# Patient Record
Sex: Female | Born: 1950 | Race: White | Hispanic: No | Smoking: Current every day smoker
Health system: Southern US, Community
[De-identification: ages and names within clinical notes are randomized; demographics above are authoritative.]

## PROBLEM LIST (undated history)

## (undated) DIAGNOSIS — G629 Polyneuropathy, unspecified: Secondary | ICD-10-CM

## (undated) DIAGNOSIS — C541 Malignant neoplasm of endometrium: Secondary | ICD-10-CM

## (undated) DIAGNOSIS — M1990 Unspecified osteoarthritis, unspecified site: Secondary | ICD-10-CM

## (undated) DIAGNOSIS — Z8481 Family history of carrier of genetic disease: Secondary | ICD-10-CM

## (undated) DIAGNOSIS — Z87442 Personal history of urinary calculi: Secondary | ICD-10-CM

## (undated) DIAGNOSIS — T7840XA Allergy, unspecified, initial encounter: Secondary | ICD-10-CM

## (undated) DIAGNOSIS — F419 Anxiety disorder, unspecified: Secondary | ICD-10-CM

## (undated) DIAGNOSIS — G709 Myoneural disorder, unspecified: Secondary | ICD-10-CM

## (undated) DIAGNOSIS — H269 Unspecified cataract: Secondary | ICD-10-CM

## (undated) DIAGNOSIS — E7800 Pure hypercholesterolemia, unspecified: Secondary | ICD-10-CM

## (undated) DIAGNOSIS — D649 Anemia, unspecified: Secondary | ICD-10-CM

## (undated) DIAGNOSIS — K219 Gastro-esophageal reflux disease without esophagitis: Secondary | ICD-10-CM

## (undated) DIAGNOSIS — R3989 Other symptoms and signs involving the genitourinary system: Secondary | ICD-10-CM

## (undated) DIAGNOSIS — N9489 Other specified conditions associated with female genital organs and menstrual cycle: Secondary | ICD-10-CM

## (undated) DIAGNOSIS — E119 Type 2 diabetes mellitus without complications: Secondary | ICD-10-CM

## (undated) DIAGNOSIS — M810 Age-related osteoporosis without current pathological fracture: Secondary | ICD-10-CM

## (undated) DIAGNOSIS — F329 Major depressive disorder, single episode, unspecified: Secondary | ICD-10-CM

## (undated) DIAGNOSIS — Z800 Family history of malignant neoplasm of digestive organs: Secondary | ICD-10-CM

## (undated) DIAGNOSIS — I10 Essential (primary) hypertension: Secondary | ICD-10-CM

## (undated) HISTORY — PX: POLYPECTOMY: SHX149

## (undated) HISTORY — DX: Age-related osteoporosis without current pathological fracture: M81.0

## (undated) HISTORY — DX: Family history of malignant neoplasm of digestive organs: Z80.0

## (undated) HISTORY — DX: Anemia, unspecified: D64.9

## (undated) HISTORY — DX: Allergy, unspecified, initial encounter: T78.40XA

## (undated) HISTORY — DX: Unspecified cataract: H26.9

## (undated) HISTORY — DX: Myoneural disorder, unspecified: G70.9

## (undated) HISTORY — PX: TONSILLECTOMY AND ADENOIDECTOMY: SHX28

## (undated) HISTORY — DX: Family history of carrier of genetic disease: Z84.81

## (undated) HISTORY — PX: TUBAL LIGATION: SHX77

---

## 1983-07-07 HISTORY — PX: OTHER SURGICAL HISTORY: SHX169

## 2016-07-06 HISTORY — PX: REPLACEMENT TOTAL KNEE BILATERAL: SUR1225

## 2017-03-06 HISTORY — PX: REVISION TOTAL KNEE ARTHROPLASTY: SUR1280

## 2017-07-06 HISTORY — PX: HYSTERECTOMY ABDOMINAL WITH SALPINGO-OOPHORECTOMY: SHX6792

## 2018-01-11 DIAGNOSIS — K449 Diaphragmatic hernia without obstruction or gangrene: Secondary | ICD-10-CM | POA: Insufficient documentation

## 2018-01-11 DIAGNOSIS — M3503 Sicca syndrome with myopathy: Secondary | ICD-10-CM | POA: Insufficient documentation

## 2018-01-11 DIAGNOSIS — N393 Stress incontinence (female) (male): Secondary | ICD-10-CM | POA: Insufficient documentation

## 2018-01-11 DIAGNOSIS — M47816 Spondylosis without myelopathy or radiculopathy, lumbar region: Secondary | ICD-10-CM | POA: Insufficient documentation

## 2018-01-11 DIAGNOSIS — M179 Osteoarthritis of knee, unspecified: Secondary | ICD-10-CM | POA: Insufficient documentation

## 2018-01-11 DIAGNOSIS — E7800 Pure hypercholesterolemia, unspecified: Secondary | ICD-10-CM | POA: Insufficient documentation

## 2018-01-11 DIAGNOSIS — K219 Gastro-esophageal reflux disease without esophagitis: Secondary | ICD-10-CM | POA: Insufficient documentation

## 2018-01-11 DIAGNOSIS — D126 Benign neoplasm of colon, unspecified: Secondary | ICD-10-CM | POA: Insufficient documentation

## 2018-01-11 DIAGNOSIS — E119 Type 2 diabetes mellitus without complications: Secondary | ICD-10-CM | POA: Insufficient documentation

## 2018-03-31 ENCOUNTER — Encounter (HOSPITAL_BASED_OUTPATIENT_CLINIC_OR_DEPARTMENT_OTHER): Payer: Self-pay | Admitting: Emergency Medicine

## 2018-03-31 ENCOUNTER — Emergency Department (HOSPITAL_BASED_OUTPATIENT_CLINIC_OR_DEPARTMENT_OTHER)
Admission: EM | Admit: 2018-03-31 | Discharge: 2018-03-31 | Disposition: A | Discharge status: Home / Self Care | Payer: Medicare Other | Attending: Emergency Medicine | Admitting: Emergency Medicine

## 2018-03-31 ENCOUNTER — Other Ambulatory Visit: Payer: Self-pay

## 2018-03-31 DIAGNOSIS — F17210 Nicotine dependence, cigarettes, uncomplicated: Secondary | ICD-10-CM | POA: Insufficient documentation

## 2018-03-31 DIAGNOSIS — E119 Type 2 diabetes mellitus without complications: Secondary | ICD-10-CM | POA: Diagnosis not present

## 2018-03-31 DIAGNOSIS — Z7982 Long term (current) use of aspirin: Secondary | ICD-10-CM | POA: Insufficient documentation

## 2018-03-31 DIAGNOSIS — Z79899 Other long term (current) drug therapy: Secondary | ICD-10-CM | POA: Insufficient documentation

## 2018-03-31 DIAGNOSIS — N950 Postmenopausal bleeding: Secondary | ICD-10-CM | POA: Insufficient documentation

## 2018-03-31 DIAGNOSIS — I10 Essential (primary) hypertension: Secondary | ICD-10-CM | POA: Diagnosis not present

## 2018-03-31 DIAGNOSIS — N939 Abnormal uterine and vaginal bleeding, unspecified: Secondary | ICD-10-CM

## 2018-03-31 DIAGNOSIS — R1084 Generalized abdominal pain: Secondary | ICD-10-CM | POA: Insufficient documentation

## 2018-03-31 HISTORY — DX: Essential (primary) hypertension: I10

## 2018-03-31 HISTORY — DX: Major depressive disorder, single episode, unspecified: F32.9

## 2018-03-31 HISTORY — DX: Pure hypercholesterolemia, unspecified: E78.00

## 2018-03-31 HISTORY — DX: Type 2 diabetes mellitus without complications: E11.9

## 2018-03-31 HISTORY — DX: Unspecified osteoarthritis, unspecified site: M19.90

## 2018-03-31 HISTORY — DX: Anxiety disorder, unspecified: F41.9

## 2018-03-31 HISTORY — DX: Gastro-esophageal reflux disease without esophagitis: K21.9

## 2018-03-31 LAB — CBC WITH DIFFERENTIAL/PLATELET
Basophils Absolute: 0.1 10*3/uL (ref 0.0–0.1)
Basophils Relative: 1 %
Eosinophils Absolute: 0.2 10*3/uL (ref 0.0–0.7)
Eosinophils Relative: 2 %
HCT: 45.6 % (ref 36.0–46.0)
Hemoglobin: 15.5 g/dL — ABNORMAL HIGH (ref 12.0–15.0)
LYMPHS ABS: 2.9 10*3/uL (ref 0.7–4.0)
Lymphocytes Relative: 32 %
MCH: 33 pg (ref 26.0–34.0)
MCHC: 34 g/dL (ref 30.0–36.0)
MCV: 97 fL (ref 78.0–100.0)
Monocytes Absolute: 0.8 10*3/uL (ref 0.1–1.0)
Monocytes Relative: 9 %
Neutro Abs: 5 10*3/uL (ref 1.7–7.7)
Neutrophils Relative %: 56 %
PLATELETS: 248 10*3/uL (ref 150–400)
RBC: 4.7 MIL/uL (ref 3.87–5.11)
RDW: 13.3 % (ref 11.5–15.5)
WBC: 8.8 10*3/uL (ref 4.0–10.5)

## 2018-03-31 LAB — BASIC METABOLIC PANEL
Anion gap: 10 (ref 5–15)
BUN: 18 mg/dL (ref 8–23)
CHLORIDE: 102 mmol/L (ref 98–111)
CO2: 28 mmol/L (ref 22–32)
CREATININE: 0.84 mg/dL (ref 0.44–1.00)
Calcium: 9.3 mg/dL (ref 8.9–10.3)
GFR calc Af Amer: >60 mL/min (ref >60)
GFR calc non Af Amer: >60 mL/min (ref >60)
Glucose, Bld: 129 mg/dL — ABNORMAL HIGH (ref 70–99)
Potassium: 4.2 mmol/L (ref 3.5–5.1)
SODIUM: 140 mmol/L (ref 135–145)

## 2018-03-31 NOTE — Discharge Instructions (Addendum)
Please read and follow all provided instructions.  Your diagnoses today include:  1. Abnormal uterine bleeding (AUB)   2. Postmenopausal vaginal bleeding    Work up and exam here are reassuring. Please follow up with OB/GYN. Please return to the ER if you have lots of vaginal bleeding, lots of abdominal pain, feeling lightheaded or dizzy, feel short of breath or have other new or worsening symptoms or new concerns.   Tests performed today include: Vital signs. See below for your results today.   Medications prescribed:  Take as prescribed   Home care instructions:  Follow any educational materials contained in this packet.  Follow-up instructions: Please follow-up with your primary care provider for further evaluation of symptoms and treatment   Return instructions:  Please return to the Emergency Department if you do not get better, if you get worse, or new symptoms OR  - Fever (temperature greater than 101.58F)  - Bleeding that does not stop with holding pressure to the area    -Severe pain (please note that you may be more sore the day after your accident)  - Chest Pain  - Difficulty breathing  - Severe nausea or vomiting  - Inability to tolerate food and liquids  - Passing out  - Skin becoming red around your wounds  - Change in mental status (confusion or lethargy)  - New numbness or weakness    Please return if you have any other emergent concerns.  Additional Information:  Your vital signs today were: BP (!) 161/67 (BP Location: Left Arm)    Pulse 78    Temp 98.6 F (37 C) (Oral)    Resp 18    Ht 5\' 4"  (1.626 m)    Wt 88.7 kg    SpO2 98%    BMI 33.56 kg/m  If your blood pressure (BP) was elevated above 135/85 this visit, please have this repeated by your doctor within one month. ---------------

## 2018-03-31 NOTE — ED Provider Notes (Signed)
[facility]PART[facility] Provider Note   CSN: [id number] Arrival date & time: 03/31/18  1201     History   Chief Complaint Chief Complaint  Patient presents with  . Vaginal Bleeding    HPI Jordan Martinez is a 67 y.o. female.  Jordan Martinez is a 67 y.o. Female who presents to the ED complaining of vaginal bleeding.  Patient reports over the past 3 days she has had some vaginal bleeding.  She is postmenopausal and she last had a menstrual cycle when she was age 40.  She is now 58.  She started having some spotting about 3 days ago and then had some heavy vaginal bleeding yesterday.  She reports after getting in the shower this morning she notes that the bleeding has slowed down.  She is only used 1 pad since she showered around 10 AM this morning.  She is had some associated abdominal cramping, but this has also gotten better currently.  She has recently moved to the area from [place] and does not have an OB/GYN in town.  She has had no nausea, vomiting or diarrhea.  No shortness of breath, chest pain, lightheadedness or dizziness.  No syncope.  No treatments prior to arrival. She is not on anticoagulants.  No fevers, coughing, chest pain, shortness of breath, syncope, abdominal pain, nausea, vomiting, diarrhea, dysuria, or rashes.  The history is provided by the patient and medical records. No language interpreter was used.  Vaginal Bleeding  Primary symptoms include vaginal bleeding.  Primary symptoms include no dysuria. Associated symptoms include abdominal pain. Pertinent negatives include no diarrhea, no nausea, no vomiting, no frequency and no light-headedness.    Past Medical History:  Diagnosis Date  . Anxiety   . Arthritis   . Depression   . Diabetes mellitus without complication ([place])   . GERD (gastroesophageal reflux disease)   . High cholesterol   . Hypertension     There are no active problems to display for this patient.   History reviewed. No  pertinent surgical history.   OB History   None      Home Medications    Prior to Admission medications   Medication Sig Start Date End Date Taking? Authorizing Provider  aspirin EC 81 MG tablet Take 81 mg by mouth daily.   Yes [provider]  cholecalciferol (VITAMIN D) 400 units TABS tablet Take 400 Units by mouth daily.   Yes [provider]  Omega-3 Fatty Acids (FISH OIL) 1200 MG CAPS Take 1,200 mg by mouth.   Yes [provider]  omeprazole (PRILOSEC) 20 MG capsule Take 20 mg by mouth daily.   Yes [provider]  atenolol (TENORMIN) 100 MG tablet Take 100 mg by mouth daily. 01/17/18   [provider]  atorvastatin (LIPITOR) 20 MG tablet Take 20 mg by mouth daily. 01/17/18   [provider]  celecoxib (CELEBREX) 200 MG capsule  03/21/18   [provider]  cyclobenzaprine (FLEXERIL) 5 MG tablet  03/30/18   [provider]  lisinopril-hydrochlorothiazide (PRINZIDE,ZESTORETIC) 10-12.5 MG tablet Take 1 tablet by mouth daily. 01/17/18   [provider]    Family History No family history on file.  Social History Social History   Tobacco Use  . Smoking status: Current Every Day Smoker  . Smokeless tobacco: Never Used  Substance Use Topics  . Alcohol use: Yes    Comment: daily  . Drug use: Never     Allergies  Amlodipine; Compazine [prochlorperazine edisylate]; Gabapentin; and Lyrica [pregabalin]   Review of Systems Review of Systems  Constitutional: Negative for chills and fever.  HENT: Negative for congestion and sore throat.   Eyes: Negative for visual disturbance.  Respiratory: Negative for cough, shortness of breath and wheezing.   Cardiovascular: Negative for chest pain and palpitations.  Gastrointestinal: Positive for abdominal pain. Negative for diarrhea, nausea and vomiting.  Genitourinary: Positive for vaginal bleeding. Negative for dysuria, frequency and genital sores.    Musculoskeletal: Negative for back pain and neck pain.  Skin: Negative for rash.  Neurological: Negative for syncope, light-headedness and headaches.     Physical Exam Updated Vital Signs BP (!) 161/67 (BP Location: Left Arm)   Pulse 78   Temp 98.6 F (37 C) (Oral)   Resp 18   Ht 5\' 4"  (1.626 m)   Wt 88.7 kg   SpO2 98%   BMI 33.56 kg/m   Physical Exam  Constitutional: She appears well-developed and well-nourished. No distress.  Nontoxic-appearing.  HENT:  Head: Normocephalic and atraumatic.  Mouth/Throat: Oropharynx is clear and moist.  Eyes: Pupils are equal, round, and reactive to light. Conjunctivae are normal. Right eye exhibits no discharge. Left eye exhibits no discharge.  Neck: Neck supple.  Cardiovascular: Normal rate, regular rhythm, normal heart sounds and intact distal pulses. Exam reveals no gallop and no friction rub.  No murmur heard. Pulmonary/Chest: Effort normal and breath sounds normal. No respiratory distress. She has no wheezes. She has no rales.  Abdominal: Soft. Bowel sounds are normal. She exhibits no distension and no mass. There is no tenderness. There is no rebound and no guarding.  Abdomen is soft and nontender to palpation.  Genitourinary:  Genitourinary Comments: Pelvic exam with female RN as chaperone. Scant amount of blood in her vaginal vault. Cervix is closed. No CMT.   Musculoskeletal: She exhibits no edema.  Lymphadenopathy:    She has no cervical adenopathy.  Neurological: She is alert. Coordination normal.  Skin: Skin is warm and dry. Capillary refill takes less than 2 seconds. No rash noted. She is not diaphoretic. No erythema. No pallor.  Psychiatric: She has a normal mood and affect. Her behavior is normal.  Nursing note and vitals reviewed.    ED Treatments / Results  Labs (all labs ordered are listed, but only abnormal results are displayed) Labs Reviewed  BASIC METABOLIC PANEL - Abnormal; Notable for the following  components:      Result Value   Glucose, Bld 129 (*)    All other components within normal limits  CBC WITH DIFFERENTIAL/PLATELET - Abnormal; Notable for the following components:   Hemoglobin 15.5 (*)    All other components within normal limits    EKG None  Radiology No results found.  Procedures Procedures (including critical care time)  Medications Ordered in ED Medications - No data to display   Initial Impression / Assessment and Plan / ED Course  I have reviewed the triage vital signs and the nursing notes.  Pertinent labs & imaging results that were available during my care of the patient were reviewed by me and considered in my medical decision making (see chart for details).    This  is a 67 y.o. Female who presents to the ED complaining of vaginal bleeding.  Patient reports over the past 3 days she has had some vaginal bleeding.  She is postmenopausal and she last had a menstrual cycle when she was age 85.  She is now 52.  She started having some spotting about 3 days ago and then had some heavy vaginal bleeding yesterday.  She reports after getting in the shower this morning she notes that the bleeding has slowed down.  She is only used 1 pad since she showered around 10 AM this morning.  She is had some associated abdominal cramping, but this has also gotten better currently.  She has recently moved to the area from [place] and does not have an OB/GYN in town.  She has had no nausea, vomiting or diarrhea.  No shortness of breath, chest pain, lightheadedness or dizziness.  No syncope.  No treatments prior to arrival. She is not on anticoagulants.  On exam the patient is afebrile and nontoxic-appearing.  Her abdomen is soft and nontender to palpation.  On pelvic exam she has a scant amount of blood in her vaginal vault.  Cervix is closed.  No cervical motion tenderness.  CBC shows normal hemoglobin hematocrit.   We will have her follow-up with OB/GYN as an outpatient.  I  discussed strict and specific return precautions. I advised the patient to follow-up with their primary care provider this week. I advised the patient to return to the emergency department with new or worsening symptoms or new concerns. The patient verbalized understanding and agreement with plan.      Final Clinical Impressions(s) / ED Diagnoses   Final diagnoses:  Abnormal uterine bleeding (AUB)  Postmenopausal vaginal bleeding    ED Discharge Orders    None       Sharmaine Base 03/31/18 1503    Virgel Manifold, MD 03/31/18 1555

## 2018-03-31 NOTE — ED Notes (Signed)
ED Provider at bedside. 

## 2018-03-31 NOTE — ED Triage Notes (Signed)
Pt reports heavy vaginal bleeding and abd cramping x 2 days.

## 2018-04-21 ENCOUNTER — Other Ambulatory Visit (HOSPITAL_COMMUNITY)
Admission: RE | Admit: 2018-04-21 | Discharge: 2018-04-21 | Disposition: A | Discharge status: Home / Self Care | Payer: Medicare Other | Source: Ambulatory Visit | Attending: Obstetrics & Gynecology | Admitting: Obstetrics & Gynecology

## 2018-04-21 ENCOUNTER — Encounter: Payer: Self-pay | Admitting: Obstetrics & Gynecology

## 2018-04-21 ENCOUNTER — Ambulatory Visit (HOSPITAL_BASED_OUTPATIENT_CLINIC_OR_DEPARTMENT_OTHER)
Admission: RE | Admit: 2018-04-21 | Discharge: 2018-04-21 | Disposition: A | Discharge status: Home / Self Care | Payer: Medicare Other | Source: Ambulatory Visit | Attending: Obstetrics & Gynecology | Admitting: Obstetrics & Gynecology

## 2018-04-21 ENCOUNTER — Encounter (HOSPITAL_BASED_OUTPATIENT_CLINIC_OR_DEPARTMENT_OTHER): Payer: Self-pay

## 2018-04-21 ENCOUNTER — Ambulatory Visit (INDEPENDENT_AMBULATORY_CARE_PROVIDER_SITE_OTHER): Payer: Medicare Other | Admitting: Obstetrics & Gynecology

## 2018-04-21 VITALS — BP 134/68 | HR 71 | Wt 198.0 lb

## 2018-04-21 DIAGNOSIS — N950 Postmenopausal bleeding: Secondary | ICD-10-CM | POA: Diagnosis present

## 2018-04-21 NOTE — Progress Notes (Signed)
History:  67 y.o. G2P2002 here today for eval of PMPB. Pt is not currentl sexaully active. G2P2 SVD x 2. Pt breast fed both of her children.  Pt reports that she had a PAP and GYN exam in Mar 2019. Pt reports LMP 09/2004.  On sept 26th pt reports heavy bleeding. Pt lives in a retirement [place].  Community. Pt bled for 3 days and had abd distension as well. Yesterday pt had an odor. She currently has no bleeding or pain at present. Pt is a retried Marine scientist.     Last mammogram Mar 2019- WNL   The following portions of the patient's history were reviewed and updated as appropriate: allergies, current medications, past family history, past medical history, past social history, past surgical history and problem list.  Review of Systems:  Pertinent items are noted in HPI.    Objective:  Physical Exam Blood pressure 134/68, pulse 71, weight 198 lb 0.6 oz (89.8 kg).  CONSTITUTIONAL: Well-developed, well-nourished female in no acute distress.  HENT:  Normocephalic, atraumatic EYES: Conjunctivae and EOM are normal. No scleral icterus.  NECK: Normal range of motion SKIN: Skin is warm and dry. No rash noted. Not diaphoretic.No pallor. [place]: Alert and oriented to person, place, and time. Normal coordination.  Abd: Soft, nontender and nondistended Pelvic: Normal appearing external genitalia; normal appearing vaginal mucosa and cervix.  Normal discharge.  Small uterus, no other palpable masses, no uterine or adnexal tenderness  The indications for endometrial biopsy were reviewed.   Risks of the biopsy including cramping, bleeding, infection, uterine perforation, inadequate specimen and need for additional procedures  were discussed. The patient states she understands and agrees to undergo procedure today. Consent was signed. Time out was performed. A sterile speculum was placed in the patient's vagina and the cervix was prepped with Betadine. A single-toothed tenaculum was placed on the anterior lip  of the cervix to stabilize it. The 3 mm pipelle was introduced into the endometrial cavity without difficulty to a depth of 8cm, and a small amount of tissue was obtained and sent to pathology. The cervix had a lesion at 6:00 this was also biopsied using a cervical biopsy forceps. [place] was applied to the cervix. The instruments were removed from the patient's vagina. Minimal bleeding from the cervix was noted. The patient tolerated the procedure well. Routine post-procedure instructions were given to the patient. The patient will follow up to review the results and for further management.     Assessment & Plan:  PMPB  S/p endo bx  Pelvic US today  F/u surg path  F/u in 1 week for results.  Raejean Swinford L. Harraway-Smith, M.D., [place]   Need records from Dr. Delene Ruffini in [place].

## 2018-04-21 NOTE — Patient Instructions (Signed)
Endometrial Biopsy Endometrial biopsy is a procedure in which a tissue sample is taken from inside the uterus. The sample is taken from the endometrium, which is the lining of the uterus. The tissue sample is then checked under a microscope to see if the tissue is normal or abnormal. This procedure helps to determine where you are in your menstrual cycle and how hormone levels are affecting the lining of the uterus. This procedure may also be used to evaluate uterine bleeding or to diagnose endometrial cancer, endometrial tuberculosis, polyps, or other inflammatory conditions. Tell a health care provider about:  Any allergies you have.  All medicines you are taking, including vitamins, herbs, eye drops, creams, and over-the-counter medicines.  Any problems you or family members have had with anesthetic medicines.  Any blood disorders you have.  Any surgeries you have had.  Any medical conditions you have.  Whether you are pregnant or may be pregnant. What are the risks? Generally, this is a safe procedure. However, problems may occur, including:  Bleeding.  Pelvic infection.  Puncture of the wall of the uterus with the biopsy device (rare).  What happens before the procedure?  Keep a record of your menstrual cycles as told by your health care provider. You may need to schedule your procedure for a specific time in your cycle.  You may want to bring a sanitary pad to wear after the procedure.  Ask your health care provider about: ? Changing or stopping your regular medicines. This is especially important if you are taking diabetes medicines or blood thinners. ? Taking medicines such as aspirin and ibuprofen. These medicines can thin your blood. Do not take these medicines before your procedure if your health care provider instructs you not to.  Plan to have someone take you home from the hospital or clinic. What happens during the procedure?  To lower your risk of  infection: ? Your health care team will wash or sanitize their hands.  You will lie on an exam table with your feet and legs supported as in a pelvic exam.  Your health care provider will insert an instrument (speculum) into your vagina to see your cervix.  Your cervix will be cleansed with an antiseptic solution.  A medicine (local anesthetic) will be used to numb the cervix.  A forceps instrument (tenaculum) will be used to hold your cervix steady for the biopsy.  A thin, rod-like instrument (uterine sound) will be inserted through your cervix to determine the length of your uterus and the location where the biopsy sample will be removed.  A thin, flexible tube (catheter) will be inserted through your cervix and into the uterus. The catheter will be used to collect the biopsy sample from your endometrial tissue.  The catheter and speculum will then be removed, and the tissue sample will be sent to a lab for examination. What happens after the procedure?  You will rest in a recovery area until you are ready to go home.  You may have mild cramping and a small amount of vaginal bleeding. This is normal.  It is up to you to get the results of your procedure. Ask your health care provider, or the department that is doing the procedure, when your results will be ready. Summary  Endometrial biopsy is a procedure in which a tissue sample is taken from the endometrium, which is the lining of the uterus.  This procedure may help to diagnose menstrual cycle problems, abnormal bleeding, or other conditions affecting   the endometrium.  Before the procedure, keep a record of your menstrual cycles as told by your health care provider.  The tissue sample that is removed will be checked under a microscope to see if it is normal or abnormal. This information is not intended to replace advice given to you by your health care provider. Make sure you discuss any questions you have with your health care  provider. Document Released: 10/23/2004 Document Revised: 07/08/2016 Document Reviewed: 07/08/2016 Elsevier Interactive Patient Education  2017 [employer]. Endometrial Biopsy, Care After This sheet gives you information about how to care for yourself after your procedure. Your health care provider may also give you more specific instructions. If you have problems or questions, contact your health care provider. What can I expect after the procedure? After the procedure, it is common to have:  Mild cramping.  A small amount of vaginal bleeding for a few days. This is normal.  Follow these instructions at home:  Take over-the-counter and prescription medicines only as told by your health care provider.  Do not douche, use tampons, or have sexual intercourse until your health care provider approves.  Return to your normal activities as told by your health care provider. Ask your health care provider what activities are safe for you.  Follow instructions from your health care provider about any activity restrictions, such as restrictions on strenuous exercise or heavy lifting. Contact a health care provider if:  You have heavy bleeding, or bleed for longer than 2 days after the procedure.  You have bad smelling discharge from your vagina.  You have a fever or chills.  You have a burning sensation when urinating or you have difficulty urinating.  You have severe pain in your lower abdomen. Get help right away if:  You have severe cramps in your stomach or back.  You pass large blood clots.  Your bleeding increases.  You become weak or light-headed, or you pass out. Summary  After the procedure, it is common to have mild cramping and a small amount of vaginal bleeding for a few days.  Do not douche, use tampons, or have sexual intercourse until your health care provider approves.  Return to your normal activities as told by your health care provider. Ask your health care  provider what activities are safe for you. This information is not intended to replace advice given to you by your health care provider. Make sure you discuss any questions you have with your health care provider. Document Released: 04/12/2013 Document Revised: 07/08/2016 Document Reviewed: 07/08/2016 Elsevier Interactive Patient Education  2017 Elsevier Inc.  

## 2018-04-22 HISTORY — PX: OTHER SURGICAL HISTORY: SHX169

## 2018-04-27 ENCOUNTER — Ambulatory Visit (INDEPENDENT_AMBULATORY_CARE_PROVIDER_SITE_OTHER): Payer: Medicare Other | Admitting: Obstetrics & Gynecology

## 2018-04-27 ENCOUNTER — Ambulatory Visit (HOSPITAL_BASED_OUTPATIENT_CLINIC_OR_DEPARTMENT_OTHER)
Admission: RE | Admit: 2018-04-27 | Discharge: 2018-04-27 | Disposition: A | Discharge status: Home / Self Care | Payer: Medicare Other | Source: Ambulatory Visit | Attending: Obstetrics & Gynecology | Admitting: Obstetrics & Gynecology

## 2018-04-27 ENCOUNTER — Encounter: Payer: Self-pay | Admitting: Obstetrics & Gynecology

## 2018-04-27 VITALS — BP 146/83 | HR 71 | Wt 198.0 lb

## 2018-04-27 DIAGNOSIS — C541 Malignant neoplasm of endometrium: Secondary | ICD-10-CM

## 2018-04-27 DIAGNOSIS — K449 Diaphragmatic hernia without obstruction or gangrene: Secondary | ICD-10-CM | POA: Diagnosis not present

## 2018-04-27 DIAGNOSIS — I700 Atherosclerosis of aorta: Secondary | ICD-10-CM | POA: Insufficient documentation

## 2018-04-27 MED ORDER — IOPAMIDOL (ISOVUE-300) INJECTION 61%
100.0000 mL | Freq: Once | INTRAVENOUS | Status: AC | PRN
  Administered 2018-04-27: 100 mL via INTRAVENOUS

## 2018-04-27 NOTE — Progress Notes (Signed)
Patient presents for Endometrial biopsy. Kathrene Alu RN

## 2018-04-27 NOTE — Progress Notes (Signed)
History:  68 y.o. [id number] here today for f/u of endometrial biopsy results and the results of her pelvic US.  Her partner did not accompany her as they had the date and time of the appointment mixed up.   The following portions of the patient's history were reviewed and updated as appropriate: allergies, current medications, past family history, past medical history, past social history, past surgical history and problem list.  Review of Systems:  Pertinent items are noted in HPI.    Objective:  Physical Exam Blood pressure (!) 146/83, pulse 71, weight 198 lb (89.8 kg).  CONSTITUTIONAL: Well-developed, well-nourished female in no acute distress.  HENT:  Normocephalic, atraumatic EYES: Conjunctivae and EOM are normal. No scleral icterus.  NECK: Normal range of motion SKIN: Skin is warm and dry. No rash noted. Not diaphoretic.No pallor. [place]: Alert and oriented to person, place, and time. Normal coordination.   Labs and Imaging US Pelvis Transvanginal Non-ob (tv Only)  Result Date: 04/21/2018 CLINICAL DATA:  Patient with postmenopausal bleeding. EXAM: TRANSABDOMINAL AND TRANSVAGINAL ULTRASOUND OF PELVIS TECHNIQUE: Both transabdominal and transvaginal ultrasound examinations of the pelvis were performed. Transabdominal technique was performed for global imaging of the pelvis including uterus, ovaries, adnexal regions, and pelvic cul-de-sac. It was necessary to proceed with endovaginal exam following the transabdominal exam to visualize the endometrium. COMPARISON:  None FINDINGS: Uterus Measurements: 6.6 x 3.0 x 3.7 cm. No fibroids or other mass visualized. Endometrium Thickness: 16 mm.  No focal abnormality visualized. Right ovary Measurements: 1.6 x 1.0 x 1.2 cm. Normal appearance/no adnexal mass. Left ovary Not visualized. Other findings No abnormal free fluid. IMPRESSION: Abnormally thickened endometrium measuring up to 16 mm. In the setting of post-menopausal bleeding, endometrial  sampling is indicated to exclude carcinoma. If results are benign, sonohysterogram should be considered for focal lesion work-up. (Ref: Radiological Reasoning: Algorithmic Workup of Abnormal Vaginal Bleeding with Endovaginal Sonography and Sonohysterography. AJR 2008; 130:Q65-78) Electronically Signed   By: Lovey Newcomer M.D.   On: 04/21/2018 16:37   US Pelvis (transabdominal Only)  Result Date: 04/21/2018 CLINICAL DATA:  Patient with postmenopausal bleeding. EXAM: TRANSABDOMINAL AND TRANSVAGINAL ULTRASOUND OF PELVIS TECHNIQUE: Both transabdominal and transvaginal ultrasound examinations of the pelvis were performed. Transabdominal technique was performed for global imaging of the pelvis including uterus, ovaries, adnexal regions, and pelvic cul-de-sac. It was necessary to proceed with endovaginal exam following the transabdominal exam to visualize the endometrium. COMPARISON:  None FINDINGS: Uterus Measurements: 6.6 x 3.0 x 3.7 cm. No fibroids or other mass visualized. Endometrium Thickness: 16 mm.  No focal abnormality visualized. Right ovary Measurements: 1.6 x 1.0 x 1.2 cm. Normal appearance/no adnexal mass. Left ovary Not visualized. Other findings No abnormal free fluid. IMPRESSION: Abnormally thickened endometrium measuring up to 16 mm. In the setting of post-menopausal bleeding, endometrial sampling is indicated to exclude carcinoma. If results are benign, sonohysterogram should be considered for focal lesion work-up. (Ref: Radiological Reasoning: Algorithmic Workup of Abnormal Vaginal Bleeding with Endovaginal Sonography and Sonohysterography. AJR 2008; 469:G29-52) Electronically Signed   By: Lovey Newcomer M.D.   On: 04/21/2018 16:37   04/21/2018 Diagnosis 1. Endometrium, biopsy - HIGH GRADE CARCINOMA, SEE COMMENT. 2. Cervix, biopsy - UNREMARKABLE ENDOCERVICAL GLANDULAR AND SQUAMOUS MUCOSA. - MULTIPLE DETACHED FRAGMENTS OF HIGH GRADE CARCINOMA, MORPHOLOGICALLY SIMILAR TO SPECIMEN 1. Microscopic  Comment 1. 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 Assessment & Plan:  Post menopausal bleeding- Endometrial cancer  Reviewed surg path  Questions answered about the process going forward  Appt made with GYN ONC while pt was in the room   CT Abd/pelvis ordered  Made appt for 2 week f/u just in case pt needed to be seen for support in the  Process. She was notified that this visit may be cnaceled should her  treatment require.      Total face-to-face time with patient was 35 min.  Greater than 50% was spent in counseling and coordination of care with the patient.   Camari Wisham L. Harraway-Smith, M.D., [place]

## 2018-04-27 NOTE — Patient Instructions (Signed)
Uterine Cancer Uterine cancer is an abnormal growth of cancer tissue (malignant tumor) in the uterus. Unlike noncancerous (benign) tumors, malignant tumors can spread to other parts of the body. Uterine cancer usually occurs after menopause. However, it may also occur around the time that menopause begins. The wall of the uterus has an inner layer of tissue (endometrium) and an outer layer of muscle tissue (myometrium). The most common type of uterine cancer begins in the endometrium (endometrial cancer). Cancer that begins in the myometrium (uterine sarcoma) is very rare. What are the causes? The exact cause of this condition is not known. What increases the risk? You are more likely to develop this condition if you:  Are older than 50.  Have an enlarged endometrium (endometrial hyperplasia).  Use hormone therapy.  Are severely overweight (obese).  Use the medicine tamoxifen.  You are white (Caucasian).  Cannot bear children (are infertile).  Have never been pregnant.  Started menstruating at an age younger than 12 years.  Are older than 52 and are still having menstrual periods.  Have a history of cancer of the ovaries, intestines, or colon or rectum (colorectal cancer).  Have a history of enlarged ovaries with small cysts (polycystic ovarian syndrome).  Have a family history of: ? Uterine cancer. ? Hereditary nonpolyposis colon cancer (HNPCC).  Have diabetes, high blood pressure, thyroid disease, or gallbladder disease.  Use long-term, high-dose birth control pills.  Have been exposed to radiation.  Smoke.  What are the signs or symptoms? Symptoms of this condition include:  Abnormal vaginal bleeding or discharge. Bleeding may start as a watery, blood-streaked flow that gradually contains more blood. This is the most common symptom. If you experience abnormal vaginal bleeding, do not assume that it is part of menopause.  Vaginal bleeding after  menopause.  Unexplained weight loss.  Bleeding between periods.  Urination that is difficult, painful, or more frequent than usual.  A lump (mass) in the vagina.  Pain, bloating, or fullness in the abdomen.  Pain in the pelvic area.  Pain during sex.  How is this diagnosed? This condition may be diagnosed based on:  Your medical history and your symptoms.  A physical and pelvic exam. Your health care provider will feel your pelvis for any growths or enlarged lymph nodes.  Blood and urine tests.  Imaging tests, such as X-rays, CT scans, ultrasound, or MRIs.  A procedure in which a thin, flexible tube with a light and camera on the end is inserted through the vagina and used to look inside the uterus (hysteroscopy).  A Pap test to check for abnormal cells in the lower part of the uterus (cervix) and the upper vagina.  Removing a tissue sample (biopsy) from the uterine lining to check for cancer cells.  Dilation and curettage (D&C). This is a procedure that involves stretching (dilation) the cervix and scraping (curettage) the inside lining of the uterus to get a biopsy and check for cancer cells.  Your cancer will be staged to determine its severity and extent. Staging is an assessment of:  The size of the tumor.  Whether the cancer has spread.  Where the cancer has spread.  The stages of uterine cancer are as follows:  Stage I. The cancer is only found in the uterus.  Stage II. The cancer has spread to the cervix.  Stage III. The cancer has spread outside the uterus, but not outside the pelvis. The cancer may have spread to the lymph nodes in the pelvis.  Lymph nodes are part of your body's disease-fighting (immune) system. Lymph nodes are found in many locations in your body, including the neck, underarm, and groin.  Stage IV. The cancer has spread to other parts of the body, such as the bladder or rectum.  How is this treated? This condition is often treated with  surgery to remove:  The uterus, cervix, fallopian tubes, and ovaries (total hysterectomy).  The uterus and cervix (simple hysterectomy).  The type of hysterectomy you will have depends on the extent of your cancer. Lymph nodes near the uterus may also be removed in some cases. Treatment may also include one or more of the following:  Chemotherapy. This uses medicines to kill the cancer cells and prevent their spread.  Radiation therapy. This uses high-energy rays to kill the cancer cells and prevent the spread of cancer.  Chemoradiation. This is a combination treatment that alternates chemotherapy with radiation treatments to enhance the way radiation works.  Brachytherapy. This involves placing radioactive materials inside the body where the cancer was removed.  Hormone therapy. This includes taking medicines that lower the levels of estrogen in the body.  Follow these instructions at home: Activity  Return to your normal activities as told by your health care provider. Ask your health care provider what activities are safe for you.  Exercise regularly as told by your health care provider.  Do not drive or use heavy machinery while taking prescription pain medicine. General instructions  Take over-the-counter and prescription medicines only as told by your health care provider.  Maintain a healthy diet.  Work with your health care provider to: ? Manage any long-term (chronic) conditions you have, such as diabetes, high blood pressure, thyroid disease, or gallbladder disease. ? Manage any side effects of your treatment.  Do not use any products that contain nicotine or tobacco, such as cigarettes and e-cigarettes. If you need help quitting, ask your health care provider.  Consider joining a support group to help you cope with stress. Your health care provider may be able to recommend a local or online support group.  Keep all follow-up visits as told by your health care  provider. This is important. Where to find more information:  American Cancer Society: https://www.cancer.Miguel Barrera ([place]): https://www.cancer.gov Contact a health care provider if:  You have pain in your pelvis or abdomen that gets worse.  You cannot urinate.  You have abnormal bleeding.  You have a fever. Get help right away if:  You develop sudden or new severe symptoms, such as: ? Heavy bleeding. ? Severe weakness. ? Pain that is severe or does not get better with medicine. Summary  Uterine cancer is an abnormal growth of cancer tissue (malignant tumor) in the uterus. The most common type of uterine cancer begins in the endometrium (endometrial cancer).  This condition is often treated with surgery to remove the uterus, cervix, fallopian tubes, and ovaries (total hysterectomy) or the uterus and cervix (simple hysterectomy).  Work with your health care provider to manage any long-term (chronic) conditions you have, such as diabetes, high blood pressure, thyroid disease, or gallbladder disease.  Consider joining a support group to help you cope with stress. Your health care provider may be able to recommend a local or online support group. This information is not intended to replace advice given to you by your health care provider. Make sure you discuss any questions you have with your health care provider. Document Released: 06/22/2005 Document Revised: 06/19/2016 Document  Reviewed: 06/19/2016 Elsevier Interactive Patient Education  2018 [employer]vier Inc.  

## 2018-04-29 ENCOUNTER — Inpatient Hospital Stay: Payer: Medicare Other

## 2018-04-29 ENCOUNTER — Inpatient Hospital Stay: Payer: Medicare Other | Attending: Obstetrics | Admitting: Obstetrics

## 2018-04-29 ENCOUNTER — Encounter: Payer: Self-pay | Admitting: Obstetrics

## 2018-04-29 VITALS — BP 107/64 | HR 72 | Temp 98.2°F | Resp 16 | Ht 64.0 in | Wt 202.0 lb

## 2018-04-29 DIAGNOSIS — C541 Malignant neoplasm of endometrium: Secondary | ICD-10-CM | POA: Diagnosis present

## 2018-04-29 DIAGNOSIS — R300 Dysuria: Secondary | ICD-10-CM | POA: Diagnosis not present

## 2018-04-29 HISTORY — PX: OTHER SURGICAL HISTORY: SHX169

## 2018-04-29 LAB — URINALYSIS, COMPLETE (UACMP) WITH MICROSCOPIC
Bilirubin Urine: NEGATIVE
Glucose, UA: NEGATIVE mg/dL
Ketones, ur: NEGATIVE mg/dL
NITRITE: POSITIVE — AB
Protein, ur: NEGATIVE mg/dL
SPECIFIC GRAVITY, URINE: 1.017 (ref 1.005–1.030)
Waxy Casts, UA: 25
pH: 5 (ref 5.0–8.0)

## 2018-04-29 MED ORDER — OXYCODONE HCL 5 MG PO TABS: 5.0000 mg | ORAL_TABLET | ORAL | 0 refills | Status: DC | PRN

## 2018-04-29 NOTE — Patient Instructions (Signed)
Preparing for your Surgery  Plan for surgery on May 03, 2018 with Dr. Everitt Amber at [place] will be scheduled for a robotic assisted total laparoscopic hysterectomy, bilateral salpingo-oophorectomy, sentinel lymph node biopsy, possible staging.  Pre-operative Testing -You will receive a phone call from presurgical testing at [facility] to arrange for a pre-operative testing appointment before your surgery.  This appointment normally occurs one to two weeks before your scheduled surgery.   -Bring your insurance card, copy of an advanced directive if applicable, medication list  -At that visit, you will be asked to sign a consent for a possible blood transfusion in case a transfusion becomes necessary during surgery.  The need for a blood transfusion is rare but having consent is a necessary part of your care.     -You should not be taking blood thinners or aspirin at least ten days prior to surgery unless instructed by your surgeon.  Day Before Surgery at [place] will be asked to take in a light diet the day before surgery.  Avoid carbonated beverages.  You will be advised to have nothing to eat or drink after midnight the evening before.    Eat a light diet the day before surgery.  Examples including soups, broths, toast, yogurt, mashed potatoes.  Things to avoid include carbonated beverages (fizzy beverages), raw fruits and raw vegetables, or beans.   If your bowels are filled with gas, your surgeon will have difficulty visualizing your pelvic organs which increases your surgical risks.  Your role in recovery Your role is to become active as soon as directed by your doctor, while still giving yourself time to heal.  Rest when you feel tired. You will be asked to do the following in order to speed your recovery:  - Cough and breathe deeply. This helps toclear and expand your lungs and can prevent pneumonia. You may be given a spirometer to  practice deep breathing. A staff member will show you how to use the spirometer. - Do mild physical activity. Walking or moving your legs help your circulation and body functions return to normal. A staff member will help you when you try to walk and will provide you with simple exercises. Do not try to get up or walk alone the first time. - Actively manage your pain. Managing your pain lets you move in comfort. We will ask you to rate your pain on a scale of zero to 10. It is your responsibility to tell your doctor or nurse where and how much you hurt so your pain can be treated.  Special Considerations -If you are diabetic, you may be placed on insulin after surgery to have closer control over your blood sugars to promote healing and recovery.  This does not mean that you will be discharged on insulin.  If applicable, your oral antidiabetics will be resumed when you are tolerating a solid diet.  -Your final pathology results from surgery should be available by the Friday after surgery and the results will be relayed to you when available.  -Dr. Lahoma Crocker is the Surgeon that assists your GYN Oncologist with surgery.  The next day after your surgery you will either see your GYN Oncologist or Dr. Lahoma Crocker.   Blood Transfusion Information WHAT IS A BLOOD TRANSFUSION? A transfusion is the replacement of blood or some of its parts. Blood is made up of multiple cells which provide different functions.  Red blood cells carry oxygen and are  used for blood loss replacement.  White blood cells fight against infection.  Platelets control bleeding.  Plasma helps clot blood.  Other blood products are available for specialized needs, such as hemophilia or other clotting disorders. BEFORE THE TRANSFUSION  Who gives blood for transfusions?   You may be able to donate blood to be used at a later date on yourself (autologous donation).  Relatives can be asked to donate blood. This is  generally not any safer than if you have received blood from a stranger. The same precautions are taken to ensure safety when a relative's blood is donated.  Healthy volunteers who are fully evaluated to make sure their blood is safe. This is blood bank blood. Transfusion therapy is the safest it has ever been in the practice of medicine. Before blood is taken from a donor, a complete history is taken to make sure that person has no history of diseases nor engages in risky social behavior (examples are intravenous drug use or sexual activity with multiple partners). The donor's travel history is screened to minimize risk of transmitting infections, such as malaria. The donated blood is tested for signs of infectious diseases, such as HIV and hepatitis. The blood is then tested to be sure it is compatible with you in order to minimize the chance of a transfusion reaction. If you or a relative donates blood, this is often done in anticipation of surgery and is not appropriate for emergency situations. It takes many days to process the donated blood. RISKS AND COMPLICATIONS Although transfusion therapy is very safe and saves many lives, the main dangers of transfusion include:   Getting an infectious disease.  Developing a transfusion reaction. This is an allergic reaction to something in the blood you were given. Every precaution is taken to prevent this. The decision to have a blood transfusion has been considered carefully by your caregiver before blood is given. Blood is not given unless the benefits outweigh the risks.

## 2018-04-29 NOTE — Patient Instructions (Signed)
Jordan Martinez  04/29/2018   Your procedure is scheduled on: 05-03-18   Report to [facility] Main  Entrance    Report to admitting at 9:30AM    Call this number if you have problems the morning of surgery [phone]     Remember: Eat a light diet the day before surgery. Examples including soups, broths, toast, yogurt, mashed potatoes. Things to avoid include carbonated beverages (fizzy beverages), raw fruits and raw vegetables, or beans. If your bowels are filled with gas, your surgeon will have difficulty visualizing your pelvic organs which increases your surgical risks.  Do not eat food or drink liquids :After Midnight. BRUSH YOUR TEETH MORNING OF SURGERY AND RINSE YOUR MOUTH OUT, NO CHEWING GUM CANDY OR MINTS.     Take these medicines the morning of surgery with A SIP OF WATER: atorvastatin, omeprazole, systane eye drops                                 You may not have any metal on your body including hair pins and              piercings  Do not wear jewelry, make-up, lotions, powders or perfumes, deodorant             Do not wear nail polish.  Do not shave  48 hours prior to surgery.                Do not bring valuables to the hospital. [place].  Contacts, dentures or bridgework may not be worn into surgery.  Leave suitcase in the car. After surgery it may be brought to your room.                 Please read over the following fact sheets you were given: _____________________________________________________________________             [facility] - Preparing for Surgery Before surgery, you can play an important role.  Because skin is not sterile, your skin needs to be as free of germs as possible.  You can reduce the number of germs on your skin by washing with CHG (chlorahexidine gluconate) soap before surgery.  CHG is an antiseptic cleaner which kills germs and bonds with the skin to  continue killing germs even after washing. Please DO NOT use if you have an allergy to CHG or antibacterial soaps.  If your skin becomes reddened/irritated stop using the CHG and inform your nurse when you arrive at Short Stay. Do not shave (including legs and underarms) for at least 48 hours prior to the first CHG shower.  You may shave your face/neck. Please follow these instructions carefully:  1.  Shower with CHG Soap the night before surgery and the  morning of Surgery.  2.  If you choose to wash your hair, wash your hair first as usual with your  normal  shampoo.  3.  After you shampoo, rinse your hair and body thoroughly to remove the  shampoo.                           4.  Use CHG as you would any other liquid soap.  You  can apply chg directly  to the skin and wash                       Gently with a scrungie or clean washcloth.  5.  Apply the CHG Soap to your body ONLY FROM THE NECK DOWN.   Do not use on face/ open                           Wound or open sores. Avoid contact with eyes, ears mouth and genitals (private parts).                       Wash face,  Genitals (private parts) with your normal soap.             6.  Wash thoroughly, paying special attention to the area where your surgery  will be performed.  7.  Thoroughly rinse your body with warm water from the neck down.  8.  DO NOT shower/wash with your normal soap after using and rinsing off  the CHG Soap.                9.  Pat yourself dry with a clean towel.            10.  Wear clean pajamas.            11.  Place clean sheets on your bed the night of your first shower and do not  sleep with pets. Day of Surgery : Do not apply any lotions/deodorants the morning of surgery.  Please wear clean clothes to the hospital/surgery center.  FAILURE TO FOLLOW THESE INSTRUCTIONS MAY RESULT IN THE CANCELLATION OF YOUR SURGERY PATIENT SIGNATURE_________________________________  NURSE  SIGNATURE__________________________________  ________________________________________________________________________   Adam Phenix  An incentive spirometer is a tool that can help keep your lungs clear and active. This tool measures how well you are filling your lungs with each breath. Taking long deep breaths may help reverse or decrease the chance of developing breathing (pulmonary) problems (especially infection) following:  A long period of time when you are unable to move or be active. BEFORE THE PROCEDURE   If the spirometer includes an indicator to show your best effort, your nurse or respiratory therapist will set it to a desired goal.  If possible, sit up straight or lean slightly forward. Try not to slouch.  Hold the incentive spirometer in an upright position. INSTRUCTIONS FOR USE  1. Sit on the edge of your bed if possible, or sit up as far as you can in bed or on a chair. 2. Hold the incentive spirometer in an upright position. 3. Breathe out normally. 4. Place the mouthpiece in your mouth and seal your lips tightly around it. 5. Breathe in slowly and as deeply as possible, raising the piston or the ball toward the top of the column. 6. Hold your breath for 3-5 seconds or for as long as possible. Allow the piston or ball to fall to the bottom of the column. 7. Remove the mouthpiece from your mouth and breathe out normally. 8. Rest for a few seconds and repeat Steps 1 through 7 at least 10 times every 1-2 hours when you are awake. Take your time and take a few normal breaths between deep breaths. 9. The spirometer may include an indicator to show your best effort. Use the indicator as a goal to work toward during  each repetition. 10. After each set of 10 deep breaths, practice coughing to be sure your lungs are clear. If you have an incision (the cut made at the time of surgery), support your incision when coughing by placing a pillow or rolled up towels firmly  against it. Once you are able to get out of bed, walk around indoors and cough well. You may stop using the incentive spirometer when instructed by your caregiver.  RISKS AND COMPLICATIONS  Take your time so you do not get dizzy or light-headed.  If you are in pain, you may need to take or ask for pain medication before doing incentive spirometry. It is harder to take a deep breath if you are having pain. AFTER USE  Rest and breathe slowly and easily.  It can be helpful to keep track of a log of your progress. Your caregiver can provide you with a simple table to help with this. If you are using the spirometer at home, follow these instructions: [place] IF:   You are having difficultly using the spirometer.  You have trouble using the spirometer as often as instructed.  Your pain medication is not giving enough relief while using the spirometer.  You develop fever of 100.5 F (38.1 C) or higher. SEEK IMMEDIATE MEDICAL CARE IF:   You cough up bloody sputum that had not been present before.  You develop fever of 102 F (38.9 C) or greater.  You develop worsening pain at or near the incision site. MAKE SURE YOU:   Understand these instructions.  Will watch your condition.  Will get help right away if you are not doing well or get worse. Document Released: 11/02/2006 Document Revised: 09/14/2011 Document Reviewed: 01/03/2007 ExitCare Patient Information 2014 [employer], [place].   ________________________________________________________________________  WHAT IS A BLOOD TRANSFUSION? Blood Transfusion Information  A transfusion is the replacement of blood or some of its parts. Blood is made up of multiple cells which provide different functions.  Red blood cells carry oxygen and are used for blood loss replacement.  White blood cells fight against infection.  Platelets control bleeding.  Plasma helps clot blood.  Other blood products are available for  specialized needs, such as hemophilia or other clotting disorders. BEFORE THE TRANSFUSION  Who gives blood for transfusions?   Healthy volunteers who are fully evaluated to make sure their blood is safe. This is blood bank blood. Transfusion therapy is the safest it has ever been in the practice of medicine. Before blood is taken from a donor, a complete history is taken to make sure that person has no history of diseases nor engages in risky social behavior (examples are intravenous drug use or sexual activity with multiple partners). The donor's travel history is screened to minimize risk of transmitting infections, such as malaria. The donated blood is tested for signs of infectious diseases, such as HIV and hepatitis. The blood is then tested to be sure it is compatible with you in order to minimize the chance of a transfusion reaction. If you or a relative donates blood, this is often done in anticipation of surgery and is not appropriate for emergency situations. It takes many days to process the donated blood. RISKS AND COMPLICATIONS Although transfusion therapy is very safe and saves many lives, the main dangers of transfusion include:   Getting an infectious disease.  Developing a transfusion reaction. This is an allergic reaction to something in the blood you were given. Every precaution is taken to prevent  this. The decision to have a blood transfusion has been considered carefully by your caregiver before blood is given. Blood is not given unless the benefits outweigh the risks. AFTER THE TRANSFUSION  Right after receiving a blood transfusion, you will usually feel much better and more energetic. This is especially true if your red blood cells have gotten low (anemic). The transfusion raises the level of the red blood cells which carry oxygen, and this usually causes an energy increase.  The nurse administering the transfusion will monitor you carefully for complications. HOME CARE  INSTRUCTIONS  No special instructions are needed after a transfusion. You may find your energy is better. Speak with your caregiver about any limitations on activity for underlying diseases you may have. SEEK MEDICAL CARE IF:   Your condition is not improving after your transfusion.  You develop redness or irritation at the intravenous (IV) site. SEEK IMMEDIATE MEDICAL CARE IF:  Any of the following symptoms occur over the next 12 hours:  Shaking chills.  You have a temperature by mouth above 102 F (38.9 C), not controlled by medicine.  Chest, back, or muscle pain.  People around you feel you are not acting correctly or are confused.  Shortness of breath or difficulty breathing.  Dizziness and fainting.  You get a rash or develop hives.  You have a decrease in urine output.  Your urine turns a dark color or changes to pink, red, or brown. Any of the following symptoms occur over the next 10 days:  You have a temperature by mouth above 102 F (38.9 C), not controlled by medicine.  Shortness of breath.  Weakness after normal activity.  The white part of the eye turns yellow (jaundice).  You have a decrease in the amount of urine or are urinating less often.  Your urine turns a dark color or changes to pink, red, or brown. Document Released: 06/19/2000 Document Revised: 09/14/2011 Document Reviewed: 02/06/2008 [facility] Patient Information 2014 [place], [place].  _______________________________________________________________________

## 2018-04-30 ENCOUNTER — Encounter: Payer: Self-pay | Admitting: Obstetrics

## 2018-04-30 NOTE — Progress Notes (Signed)
[place] at [facility]   Consult Note: New Patient FIRST VISIT   Consult was requested by Dr. Lavonia Drafts for endometrial cancer   Chief Complaint  Patient presents with  . Endometrial cancer [facility])    GYN Oncologic Summary 1. TBD o Planning surgery  HPI: [name]  is a very nice moderately anxious 67 y.o.  P2  She noted 3 weeks of postmenopausal bleeding.  On 03/30/2018 she reports this was quite heavy with cramps.  She is new to the [place] living in a retirement [place] and she was unable to find a provider in a timely manner.  She was referred by the retirement center to an urgent care center in [facility] who then triaged her to see Dr. Ihor Dow.  Given the patient's symptoms an endometrial biopsy was performed along with a pelvic ultrasound.  Documented exam by Dr. Ihor Dow states that there was a lesion at 6:00 on the cervix which was biopsied.  In addition to the endometrial biopsy being performed.  The endometrial biopsy revealed high-grade carcinoma with a comment stating the majority appears to be FIGO grade 3 endometrioid with scattered foci suggestive of serous phenotype.  The cervix revealed unremarkable endocervical glandular and squamous mucosa there were detached fragments of high-grade carcinoma similar to the endometrial biopsy tissue.  Ultrasound revealed a uterus 6.6 x 3 x 3.7 cm with no fibroids endometrium 1.6 cm right ovary normal left ovary not visualized.  The patient is retired Writer.  She states her last Pap smear was in March 2019 in [place].  She states she got a phone call from that office that her Pap smear was normal.  A CT scan was performed prior to her visit today and noted below.  In summary no evidence of extrauterine disease.  She admits to anxiety and has some pressured speech today.  She does note some vague pain and again the primary complaint of the  vaginal bleeding.  Given the diagnosis of uterine cancer she was referred for management and recommendations.  Imported EPIC Oncologic History:   No history exists.    Measurement of disease: Pending further workup . CA125 TBD  Radiology: US Pelvis Transvanginal Non-ob (tv Only)  Result Date: 04/21/2018 CLINICAL DATA:  Patient with postmenopausal bleeding. EXAM: TRANSABDOMINAL AND TRANSVAGINAL ULTRASOUND OF PELVIS TECHNIQUE: Both transabdominal and transvaginal ultrasound examinations of the pelvis were performed. Transabdominal technique was performed for global imaging of the pelvis including uterus, ovaries, adnexal regions, and pelvic cul-de-sac. It was necessary to proceed with endovaginal exam following the transabdominal exam to visualize the endometrium. COMPARISON:  None FINDINGS: Uterus Measurements: 6.6 x 3.0 x 3.7 cm. No fibroids or other mass visualized. Endometrium Thickness: 16 mm.  No focal abnormality visualized. Right ovary Measurements: 1.6 x 1.0 x 1.2 cm. Normal appearance/no adnexal mass. Left ovary Not visualized. Other findings No abnormal free fluid. IMPRESSION: Abnormally thickened endometrium measuring up to 16 mm. In the setting of post-menopausal bleeding, endometrial sampling is indicated to exclude carcinoma. If results are benign, sonohysterogram should be considered for focal lesion work-up. (Ref: Radiological Reasoning: Algorithmic Workup of Abnormal Vaginal Bleeding with Endovaginal Sonography and Sonohysterography. AJR 2008; 376:E83-15) Electronically Signed   By: Lovey Newcomer M.D.   On: 04/21/2018 16:37   US Pelvis (transabdominal Only)  Result Date: 04/21/2018 CLINICAL DATA:  Patient with postmenopausal bleeding. EXAM: TRANSABDOMINAL AND TRANSVAGINAL ULTRASOUND OF PELVIS TECHNIQUE: Both transabdominal and transvaginal ultrasound examinations of  the pelvis were performed. Transabdominal technique was performed for global imaging of the pelvis including uterus,  ovaries, adnexal regions, and pelvic cul-de-sac. It was necessary to proceed with endovaginal exam following the transabdominal exam to visualize the endometrium. COMPARISON:  None FINDINGS: Uterus Measurements: 6.6 x 3.0 x 3.7 cm. No fibroids or other mass visualized. Endometrium Thickness: 16 mm.  No focal abnormality visualized. Right ovary Measurements: 1.6 x 1.0 x 1.2 cm. Normal appearance/no adnexal mass. Left ovary Not visualized. Other findings No abnormal free fluid. IMPRESSION: Abnormally thickened endometrium measuring up to 16 mm. In the setting of post-menopausal bleeding, endometrial sampling is indicated to exclude carcinoma. If results are benign, sonohysterogram should be considered for focal lesion work-up. (Ref: Radiological Reasoning: Algorithmic Workup of Abnormal Vaginal Bleeding with Endovaginal Sonography and Sonohysterography. AJR 2008; 527:P82-42) Electronically Signed   By: Lovey Newcomer M.D.   On: 04/21/2018 16:37   Ct Abdomen Pelvis W Contrast  Result Date: 04/28/2018 CLINICAL DATA:  Recent diagnosis of endometrial carcinoma. EXAM: CT ABDOMEN AND PELVIS WITH CONTRAST TECHNIQUE: Multidetector CT imaging of the abdomen and pelvis was performed using the standard protocol following bolus administration of intravenous contrast. CONTRAST:  133mL ISOVUE-300 IOPAMIDOL (ISOVUE-300) INJECTION 61% COMPARISON:  None. FINDINGS: Lower chest: No acute abnormality. Hepatobiliary: Hepatic steatosis. No focal liver abnormality identified. Pancreas: Unremarkable. No pancreatic ductal dilatation or surrounding inflammatory changes. Spleen: Normal in size without focal abnormality. Adrenals/Urinary Tract: Adrenal glands are unremarkable. Kidneys are normal, without renal calculi, focal lesion, or hydronephrosis. Bladder is unremarkable. Stomach/Bowel: Moderate to large hiatal hernia. The small bowel loops have a normal caliber. No obstruction. The appendix is visualized and appears normal. Distal colonic  diverticula identified without acute inflammation. Vascular/Lymphatic: Aortic atherosclerosis. No aneurysm. No enlarged abdominopelvic lymph nodes. Reproductive: The endometrium appears thickened measuring 1.7 cm, image 65/6. No adnexal mass. Other: There is no ascites. No peritoneal nodules. Umbilical hernia contains fat only. Musculoskeletal: Spondylosis identified within the lumbar spine. IMPRESSION: 1. Thickening of the endometrium identified compatible with endometrial carcinoma. 2. No findings to suggest metastatic adenopathy or distant metastatic disease. 3. Hiatal hernia 4.  Aortic Atherosclerosis (ICD10-I70.0). Electronically Signed   By: Kerby Moors M.D.   On: 04/28/2018 10:47   .   Outpatient Encounter Medications as of 04/29/2018  Medication Sig  . aspirin EC 81 MG tablet Take 81 mg by mouth daily.  Marland Kitchen atenolol (TENORMIN) 100 MG tablet Take 100 mg by mouth at bedtime.   Marland Kitchen atorvastatin (LIPITOR) 20 MG tablet Take 20 mg by mouth daily.  . celecoxib (CELEBREX) 200 MG capsule Take 200 mg by mouth 2 (two) times daily.   . cholecalciferol (VITAMIN D) 400 units TABS tablet Take 400 Units by mouth daily.  . cyclobenzaprine (FLEXERIL) 5 MG tablet Take 5 mg by mouth 3 (three) times daily.   Marland Kitchen lisinopril-hydrochlorothiazide (PRINZIDE,ZESTORETIC) 10-12.5 MG tablet Take 1 tablet by mouth daily.  . Omega-3 Fatty Acids (FISH OIL) 1200 MG CAPS Take 1,200 mg by mouth daily.   Marland Kitchen omeprazole (PRILOSEC) 20 MG capsule Take 20 mg by mouth 2 (two) times daily.   Marland Kitchen oxyCODONE (ROXICODONE) 5 MG immediate release tablet Take 1 tablet (5 mg total) by mouth every 4 (four) hours as needed for severe pain.   No facility-administered encounter medications on file as of 04/29/2018.    Allergies  Allergen Reactions  . Amlodipine     Blurred vision   . Compazine [Prochlorperazine Edisylate]     Stroke symptoms,  . Gabapentin  Blurred vision and nystagmus  . Lyrica [Pregabalin]     Blurred Vision   .  Sertraline Other (See Comments)    Visual disturbances.     Past Medical History:  Diagnosis Date  . Anxiety   . Arthritis   . Depression   . Diabetes mellitus without complication (HCC)    diet controlled. States always has borderline HgbA1C  . GERD (gastroesophageal reflux disease)   . High cholesterol   . Hypertension    Past Surgical History:  Procedure Laterality Date  . REPLACEMENT TOTAL KNEE BILATERAL Bilateral 2018   Right x 2. 2019 undergoing intermittent injection "RFA" procedures with Dr. Sheliah Mends [facility]  . TONSILLECTOMY AND ADENOIDECTOMY    . TUBAL LIGATION          Past Gynecological History:   GYNECOLOGIC HISTORY:  . No LMP recorded. Patient is postmenopausal. 83 . Menarche: 67 years old . P 2 . Contraceptive desogen OCP and diaphragm history . HRT none  . Last Pap March 2019 "states normal" Family Hx:  Family History  Adopted: Yes  Family history unknown: Yes   Social Hx:  Marland Kitchen Tobacco use: current every day . Alcohol use: 2 glasses wine daily, negative CAGE . Illicit Drug use: none . Illicit IV Drug use: none    Review of Systems: Review of Systems  Constitutional: Positive for fatigue and unexpected weight change.  Gastrointestinal: Positive for abdominal distention and abdominal pain.  Endocrine: Positive for hot flashes.  Genitourinary: Positive for dysuria, pelvic pain and vaginal bleeding.   Musculoskeletal: Positive for arthralgias and myalgias.  Neurological: Positive for numbness.  Hematological: Bruises/bleeds easily.  Psychiatric/Behavioral: The patient is nervous/anxious.   All other systems reviewed and are negative. +weight gain  Vitals:  Vitals:   04/29/18 1400  BP: 107/64  Pulse: 72  Resp: 16  Temp: 98.2 F (36.8 C)  SpO2: 94%   Vitals:   04/29/18 1400  Weight: 202 lb (91.6 kg)  Height: 5\' 4"  (1.626 m)   Body mass index is 34.67 kg/m.  Physical Exam: General :  Overweight.Well developed, 66 y.o., female in  no apparent distress HEENT:  Normocephalic/atraumatic, symmetric, EOMI, eyelids normal Neck:   Supple, no masses.  Lymphatics:  No cervical/ submandibular/ supraclavicular/ infraclavicular/ inguinal adenopathy Respiratory:  Respirations unlabored, no use of accessory muscles CV:   Deferred Breast:  Deferred Musculoskeletal: No CVA tenderness, normal muscle strength. Abdomen:  Soft, non-tender and nondistended. No evidence of hernia. No masses. Extremities:  No lymphedema, no erythema, non-tender. Skin:   Normal inspection Neuro/Psych:  No focal motor deficit, no abnormal mental status. Normal gait. Normal affect. Alert and oriented to person, place, and time  Genito Urinary: Vulva: Normal external female genitalia.  Bladder/urethra: Urethral meatus normal in size and location. No lesions or   masses, well supported bladder Speculum exam: Vagina: No lesion, no discharge, no bleeding. Cervix: Rectangular biopsy site with smooth superficial crater seen; no other lesions.  Bimanual exam: No lesions. Cervix palpates normal Uterus: Normal size, mobile.  Adnexal region: No masses. Rectovaginal:  Good tone, no masses, no cul de sac nodularity, no parametrial involvement or nodularity.   Assessment  Endometrial CA ECOG PERFORMANCE STATUS: 1 - Symptomatic but completely ambulatory  Plan  1. Complexity of visit ? This is a new problem and additional workup is planned ? Data reviewed ? I independently reviewed the images and the radiology reports  ? She has thickened uterine lining. I agree with radiology there is no  other obvious extrauterine disease. ? I reviewed her referring doctor's office notes and I have summarized in the HPI ? History was obtained from the patient and the chart ? There is no tumor marker drawn; I plan to order CA125 on her preop labs in the event this is UPSC for MOD. ? This is an acute onset of disease that without treatment may pose a threat to life and management  includes major surgery with identified risk factor of obesity, diabetes, and tobacco use. 2. Counseling for uterine cancer o We discussed the diagnosis of uterine cancer the typical presentation and prognosis o We reviewed the difference between grade and stage o Standard of care hysterectomy versus alternatives of hormones and radiation were reviewed o We discussed the possibility of additional treatments following surgery, such as radiation and/or chemotherapy 3. Surgical management o Patient would like to proceed with standard of care management i.e. Hysterectomy o Surgical sketch was reviewed including the risks, benefits, and alternatives.  She was given a copy of this today and one will be placed in the chart. o Plan will be for robotic assisted laparoscopic hysterectomy, BSO, sentinel lymph node biopsy, washings, and possible full staging 4. Preoperative items will include  o Obtaining a chest x-ray o CA125 o UA given the dysuria complaints 5. The patient and her partner were present for the discussion with no further questions after review of the above o She understands surgery will be with Dr. Denman George in order to expedite her care, given the high-grade disease o She will return 2-3 weeks postop with either myself or Dr. Denman George to review the pathology and discuss any further management  Mart Piggs, MD Gynecologic Oncologist 04/30/2018, 8:35 AM  ADDENDUM - I did speak to Dr. Evelene Croon about her recent knee procedures (including today) and he gave verbal clearance for [place] to proceed to surgery.   Cc: Dr. Lavonia Drafts (Referring Ob/Gyn) Javier Glazier, MD (PCP)

## 2018-04-30 NOTE — H&P (View-Only) (Signed)
[place] at [facility]   Consult Note: New Patient FIRST VISIT   Consult was requested by Dr. Lavonia Drafts for endometrial cancer   Chief Complaint  Patient presents with  . Endometrial cancer [facility])    GYN Oncologic Summary 1. TBD o Planning surgery  HPI: [name]  is a very nice moderately anxious 67 y.o.  P2  She noted 3 weeks of postmenopausal bleeding.  On 03/30/2018 she reports this was quite heavy with cramps.  [place] new to the [place] living in a retirement [place] and she was unable to find a provider in a timely manner.  She was referred by the retirement center to an urgent care center in [facility] who then triaged her to see Dr. Ihor Dow.  Given the patient's symptoms an endometrial biopsy was performed along with a pelvic ultrasound.  Documented exam by Dr. Ihor Dow states that there was a lesion at 6:00 on the cervix which was biopsied.  In addition to the endometrial biopsy being performed.  The endometrial biopsy revealed high-grade carcinoma with a comment stating the majority appears to be FIGO grade 3 endometrioid with scattered foci suggestive of serous phenotype.  The cervix revealed unremarkable endocervical glandular and squamous mucosa there were detached fragments of high-grade carcinoma similar to the endometrial biopsy tissue.  Ultrasound revealed a uterus 6.6 x 3 x 3.7 cm with no fibroids endometrium 1.6 cm right ovary normal left ovary not visualized.  The patient is retired Writer.  She states her last Pap smear was in March 2019 in [place].  She states she got a phone call from that office that her Pap smear was normal.  A CT scan was performed prior to her visit today and noted below.  In summary no evidence of extrauterine disease.  She admits to anxiety and has some pressured speech today.  She does note some vague pain and again the primary complaint of the  vaginal bleeding.  Given the diagnosis of uterine cancer she was referred for management and recommendations.  Imported EPIC Oncologic History:   No history exists.    Measurement of disease: Pending further workup . CA125 TBD  Radiology: US Pelvis Transvanginal Non-ob (tv Only)  Result Date: 04/21/2018 CLINICAL DATA:  Patient with postmenopausal bleeding. EXAM: TRANSABDOMINAL AND TRANSVAGINAL ULTRASOUND OF PELVIS TECHNIQUE: Both transabdominal and transvaginal ultrasound examinations of the pelvis were performed. Transabdominal technique was performed for global imaging of the pelvis including uterus, ovaries, adnexal regions, and pelvic cul-de-sac. It was necessary to proceed with endovaginal exam following the transabdominal exam to visualize the endometrium. COMPARISON:  None FINDINGS: Uterus Measurements: 6.6 x 3.0 x 3.7 cm. No fibroids or other mass visualized. Endometrium Thickness: 16 mm.  No focal abnormality visualized. Right ovary Measurements: 1.6 x 1.0 x 1.2 cm. Normal appearance/no adnexal mass. Left ovary Not visualized. Other findings No abnormal free fluid. IMPRESSION: Abnormally thickened endometrium measuring up to 16 mm. In the setting of post-menopausal bleeding, endometrial sampling is indicated to exclude carcinoma. If results are benign, sonohysterogram should be considered for focal lesion work-up. (Ref: Radiological Reasoning: Algorithmic Workup of Abnormal Vaginal Bleeding with Endovaginal Sonography and Sonohysterography. AJR 2008; [id number]:O27-74) Electronically Signed   By: Lovey Newcomer M.D.   On: 04/21/2018 16:37   US Pelvis (transabdominal Only)  Result Date: 04/21/2018 CLINICAL DATA:  Patient with postmenopausal bleeding. EXAM: TRANSABDOMINAL AND TRANSVAGINAL ULTRASOUND OF PELVIS TECHNIQUE: Both transabdominal and transvaginal ultrasound examinations of  the pelvis were performed. Transabdominal technique was performed for global imaging of the pelvis including uterus,  ovaries, adnexal regions, and pelvic cul-de-sac. It was necessary to proceed with endovaginal exam following the transabdominal exam to visualize the endometrium. COMPARISON:  None FINDINGS: Uterus Measurements: 6.6 x 3.0 x 3.7 cm. No fibroids or other mass visualized. Endometrium Thickness: 16 mm.  No focal abnormality visualized. Right ovary Measurements: 1.6 x 1.0 x 1.2 cm. Normal appearance/no adnexal mass. Left ovary Not visualized. Other findings No abnormal free fluid. IMPRESSION: Abnormally thickened endometrium measuring up to 16 mm. In the setting of post-menopausal bleeding, endometrial sampling is indicated to exclude carcinoma. If results are benign, sonohysterogram should be considered for focal lesion work-up. (Ref: Radiological Reasoning: Algorithmic Workup of Abnormal Vaginal Bleeding with Endovaginal Sonography and Sonohysterography. AJR 2008; 149:F02-63) Electronically Signed   By: Lovey Newcomer M.D.   On: 04/21/2018 16:37   Ct Abdomen Pelvis W Contrast  Result Date: 04/28/2018 CLINICAL DATA:  Recent diagnosis of endometrial carcinoma. EXAM: CT ABDOMEN AND PELVIS WITH CONTRAST TECHNIQUE: Multidetector CT imaging of the abdomen and pelvis was performed using the standard protocol following bolus administration of intravenous contrast. CONTRAST:  154mL ISOVUE-300 IOPAMIDOL (ISOVUE-300) INJECTION 61% COMPARISON:  None. FINDINGS: Lower chest: No acute abnormality. Hepatobiliary: Hepatic steatosis. No focal liver abnormality identified. Pancreas: Unremarkable. No pancreatic ductal dilatation or surrounding inflammatory changes. Spleen: Normal in size without focal abnormality. Adrenals/Urinary Tract: Adrenal glands are unremarkable. Kidneys are normal, without renal calculi, focal lesion, or hydronephrosis. Bladder is unremarkable. Stomach/Bowel: Moderate to large hiatal hernia. The small bowel loops have a normal caliber. No obstruction. The appendix is visualized and appears normal. Distal colonic  diverticula identified without acute inflammation. Vascular/Lymphatic: Aortic atherosclerosis. No aneurysm. No enlarged abdominopelvic lymph nodes. Reproductive: The endometrium appears thickened measuring 1.7 cm, image 65/6. No adnexal mass. Other: There is no ascites. No peritoneal nodules. Umbilical hernia contains fat only. Musculoskeletal: Spondylosis identified within the lumbar spine. IMPRESSION: 1. Thickening of the endometrium identified compatible with endometrial carcinoma. 2. No findings to suggest metastatic adenopathy or distant metastatic disease. 3. Hiatal hernia 4.  Aortic Atherosclerosis (ICD10-I70.0). Electronically Signed   By: Kerby Moors M.D.   On: 04/28/2018 10:47   .   Outpatient Encounter Medications as of 04/29/2018  Medication Sig  . aspirin EC 81 MG tablet Take 81 mg by mouth daily.  Marland Kitchen atenolol (TENORMIN) 100 MG tablet Take 100 mg by mouth at bedtime.   Marland Kitchen atorvastatin (LIPITOR) 20 MG tablet Take 20 mg by mouth daily.  . celecoxib (CELEBREX) 200 MG capsule Take 200 mg by mouth 2 (two) times daily.   . cholecalciferol (VITAMIN D) 400 units TABS tablet Take 400 Units by mouth daily.  . cyclobenzaprine (FLEXERIL) 5 MG tablet Take 5 mg by mouth 3 (three) times daily.   Marland Kitchen lisinopril-hydrochlorothiazide (PRINZIDE,ZESTORETIC) 10-12.5 MG tablet Take 1 tablet by mouth daily.  . Omega-3 Fatty Acids (FISH OIL) 1200 MG CAPS Take 1,200 mg by mouth daily.   Marland Kitchen omeprazole (PRILOSEC) 20 MG capsule Take 20 mg by mouth 2 (two) times daily.   Marland Kitchen oxyCODONE (ROXICODONE) 5 MG immediate release tablet Take 1 tablet (5 mg total) by mouth every 4 (four) hours as needed for severe pain.   No facility-administered encounter medications on file as of 04/29/2018.    Allergies  Allergen Reactions  . Amlodipine     Blurred vision   . Compazine [Prochlorperazine Edisylate]     Stroke symptoms,  . Gabapentin  Blurred vision and nystagmus  . Lyrica [Pregabalin]     Blurred Vision   .  Sertraline Other (See Comments)    Visual disturbances.     Past Medical History:  Diagnosis Date  . Anxiety   . Arthritis   . Depression   . Diabetes mellitus without complication (HCC)    diet controlled. States always has borderline HgbA1C  . GERD (gastroesophageal reflux disease)   . High cholesterol   . Hypertension    Past Surgical History:  Procedure Laterality Date  . REPLACEMENT TOTAL KNEE BILATERAL Bilateral 2018   Right x 2. 2019 undergoing intermittent injection "RFA" procedures with Dr. Sheliah Mends [place]  . TONSILLECTOMY AND ADENOIDECTOMY    . TUBAL LIGATION          Past Gynecological History:   GYNECOLOGIC HISTORY:  . No LMP recorded. Patient is postmenopausal. 64 . Menarche: 67 years old . P 2 . Contraceptive desogen OCP and diaphragm history . HRT none  . Last Pap March 2019 "states normal" Family Hx:  Family History  Adopted: Yes  Family history unknown: Yes   Social Hx:  Marland Kitchen Tobacco use: current every day . Alcohol use: 2 glasses wine daily, negative CAGE . Illicit Drug use: none . Illicit IV Drug use: none    Review of Systems: Review of Systems  Constitutional: Positive for fatigue and unexpected weight change.  Gastrointestinal: Positive for abdominal distention and abdominal pain.  Endocrine: Positive for hot flashes.  Genitourinary: Positive for dysuria, pelvic pain and vaginal bleeding.   Musculoskeletal: Positive for arthralgias and myalgias.  Neurological: Positive for numbness.  Hematological: Bruises/bleeds easily.  Psychiatric/Behavioral: The patient is nervous/anxious.   All other systems reviewed and are negative. +weight gain  Vitals:  Vitals:   04/29/18 1400  BP: 107/64  Pulse: 72  Resp: 16  Temp: 98.2 F (36.8 C)  SpO2: 94%   Vitals:   04/29/18 1400  Weight: 202 lb (91.6 kg)  Height: 5\' 4"  (1.626 m)   Body mass index is 34.67 kg/m.  Physical Exam: General :  Overweight.Well developed, 67 y.o., female in  no apparent distress HEENT:  Normocephalic/atraumatic, symmetric, EOMI, eyelids normal Neck:   Supple, no masses.  Lymphatics:  No cervical/ submandibular/ supraclavicular/ infraclavicular/ inguinal adenopathy Respiratory:  Respirations unlabored, no use of accessory muscles CV:   Deferred Breast:  Deferred Musculoskeletal: No CVA tenderness, normal muscle strength. Abdomen:  Soft, non-tender and nondistended. No evidence of hernia. No masses. Extremities:  No lymphedema, no erythema, non-tender. Skin:   Normal inspection Neuro/Psych:  No focal motor deficit, no abnormal mental status. Normal gait. Normal affect. Alert and oriented to person, place, and time  Genito Urinary: Vulva: Normal external female genitalia.  Bladder/urethra: Urethral meatus normal in size and location. No lesions or   masses, well supported bladder Speculum exam: Vagina: No lesion, no discharge, no bleeding. Cervix: Rectangular biopsy site with smooth superficial crater seen; no other lesions.  Bimanual exam: No lesions. Cervix palpates normal Uterus: Normal size, mobile.  Adnexal region: No masses. Rectovaginal:  Good tone, no masses, no cul de sac nodularity, no parametrial involvement or nodularity.   Assessment  Endometrial CA ECOG PERFORMANCE STATUS: 1 - Symptomatic but completely ambulatory  Plan  1. Complexity of visit ? This is a new problem and additional workup is planned ? Data reviewed ? I independently reviewed the images and the radiology reports  ? She has thickened uterine lining. I agree with radiology there is no  other obvious extrauterine disease. ? I reviewed her referring doctor's office notes and I have summarized in the HPI ? History was obtained from the patient and the chart ? There is no tumor marker drawn; I plan to order CA125 on her preop labs in the event this is UPSC for MOD. ? This is an acute onset of disease that without treatment may pose a threat to life and management  includes major surgery with identified risk factor of obesity, diabetes, and tobacco use. 2. Counseling for uterine cancer o We discussed the diagnosis of uterine cancer the typical presentation and prognosis o We reviewed the difference between grade and stage o Standard of care hysterectomy versus alternatives of hormones and radiation were reviewed o We discussed the possibility of additional treatments following surgery, such as radiation and/or chemotherapy 3. Surgical management o Patient would like to proceed with standard of care management i.e. Hysterectomy o Surgical sketch was reviewed including the risks, benefits, and alternatives.  She was given a copy of this today and one will be placed in the chart. o Plan will be for robotic assisted laparoscopic hysterectomy, BSO, sentinel lymph node biopsy, washings, and possible full staging 4. Preoperative items will include  o Obtaining a chest x-ray o CA125 o UA given the dysuria complaints 5. The patient and her partner were present for the discussion with no further questions after review of the above o She understands surgery will be with Dr. Denman George in order to expedite her care, given the high-grade disease o She will return 2-3 weeks postop with either myself or Dr. Denman George to review the pathology and discuss any further management  Mart Piggs, MD Gynecologic Oncologist 04/30/2018, 8:35 AM  ADDENDUM - I did speak to Dr. Evelene Croon about her recent knee procedures (including today) and he gave verbal clearance for [place] to proceed to surgery.   Cc: Dr. Lavonia Drafts (Referring Ob/Gyn) Javier Glazier, MD (PCP)

## 2018-05-01 LAB — URINE CULTURE

## 2018-05-02 ENCOUNTER — Other Ambulatory Visit: Payer: Self-pay | Admitting: Gynecologic Oncology

## 2018-05-02 ENCOUNTER — Telehealth: Payer: Self-pay

## 2018-05-02 ENCOUNTER — Ambulatory Visit (HOSPITAL_COMMUNITY)
Admission: RE | Admit: 2018-05-02 | Discharge: 2018-05-02 | Disposition: A | Discharge status: Home / Self Care | Payer: Medicare Other | Source: Ambulatory Visit | Attending: Gynecologic Oncology | Admitting: Gynecologic Oncology

## 2018-05-02 ENCOUNTER — Telehealth: Payer: Self-pay | Admitting: *Deleted

## 2018-05-02 ENCOUNTER — Other Ambulatory Visit: Payer: Self-pay

## 2018-05-02 ENCOUNTER — Encounter (HOSPITAL_COMMUNITY)
Admission: RE | Admit: 2018-05-02 | Discharge: 2018-05-02 | Disposition: A | Discharge status: Home / Self Care | Payer: Medicare Other | Source: Ambulatory Visit | Attending: Gynecologic Oncology | Admitting: Gynecologic Oncology

## 2018-05-02 ENCOUNTER — Encounter: Payer: Self-pay | Admitting: Gynecologic Oncology

## 2018-05-02 DIAGNOSIS — E119 Type 2 diabetes mellitus without complications: Secondary | ICD-10-CM | POA: Diagnosis not present

## 2018-05-02 DIAGNOSIS — Z01818 Encounter for other preprocedural examination: Secondary | ICD-10-CM | POA: Diagnosis present

## 2018-05-02 DIAGNOSIS — N390 Urinary tract infection, site not specified: Secondary | ICD-10-CM

## 2018-05-02 DIAGNOSIS — C541 Malignant neoplasm of endometrium: Secondary | ICD-10-CM | POA: Insufficient documentation

## 2018-05-02 DIAGNOSIS — I10 Essential (primary) hypertension: Secondary | ICD-10-CM | POA: Insufficient documentation

## 2018-05-02 DIAGNOSIS — I700 Atherosclerosis of aorta: Secondary | ICD-10-CM | POA: Insufficient documentation

## 2018-05-02 HISTORY — DX: Polyneuropathy, unspecified: G62.9

## 2018-05-02 HISTORY — DX: Other specified conditions associated with female genital organs and menstrual cycle: N94.89

## 2018-05-02 HISTORY — DX: Malignant neoplasm of endometrium: C54.1

## 2018-05-02 HISTORY — DX: Other symptoms and signs involving the genitourinary system: R39.89

## 2018-05-02 HISTORY — DX: Personal history of urinary calculi: Z87.442

## 2018-05-02 LAB — CBC
HCT: 48.4 % — ABNORMAL HIGH (ref 36.0–46.0)
Hemoglobin: 15.5 g/dL — ABNORMAL HIGH (ref 12.0–15.0)
MCH: 31.8 pg (ref 26.0–34.0)
MCHC: 32 g/dL (ref 30.0–36.0)
MCV: 99.2 fL (ref 80.0–100.0)
Platelets: 253 10*3/uL (ref 150–400)
RBC: 4.88 MIL/uL (ref 3.87–5.11)
RDW: 12.9 % (ref 11.5–15.5)
WBC: 12.9 10*3/uL — ABNORMAL HIGH (ref 4.0–10.5)
nRBC: 0 % (ref 0.0–0.2)

## 2018-05-02 LAB — URINALYSIS, ROUTINE W REFLEX MICROSCOPIC
BILIRUBIN URINE: NEGATIVE
Glucose, UA: NEGATIVE mg/dL
Ketones, ur: NEGATIVE mg/dL
Nitrite: POSITIVE — AB
Protein, ur: 30 mg/dL — AB
Specific Gravity, Urine: 1.014 (ref 1.005–1.030)
WBC, UA: >50 WBC/hpf — ABNORMAL HIGH (ref 0–5)
pH: 5 (ref 5.0–8.0)

## 2018-05-02 LAB — COMPREHENSIVE METABOLIC PANEL
ALK PHOS: 135 U/L — AB (ref 38–126)
ALT: 44 U/L (ref 0–44)
ANION GAP: 10 (ref 5–15)
AST: 34 U/L (ref 15–41)
Albumin: 4.1 g/dL (ref 3.5–5.0)
BUN: 24 mg/dL — ABNORMAL HIGH (ref 8–23)
CO2: 26 mmol/L (ref 22–32)
Calcium: 9.6 mg/dL (ref 8.9–10.3)
Chloride: 105 mmol/L (ref 98–111)
Creatinine, Ser: 0.74 mg/dL (ref 0.44–1.00)
Glucose, Bld: 178 mg/dL — ABNORMAL HIGH (ref 70–99)
Potassium: 4.5 mmol/L (ref 3.5–5.1)
SODIUM: 141 mmol/L (ref 135–145)
Total Bilirubin: 1 mg/dL (ref 0.3–1.2)
Total Protein: 7.6 g/dL (ref 6.5–8.1)

## 2018-05-02 LAB — HEMOGLOBIN A1C
HEMOGLOBIN A1C: 7.3 % — AB (ref 4.8–5.6)
MEAN PLASMA GLUCOSE: 162.81 mg/dL

## 2018-05-02 LAB — ABO/RH: ABO/RH(D): A POS

## 2018-05-02 MED ORDER — CIPROFLOXACIN HCL 250 MG PO TABS: 250.0000 mg | ORAL_TABLET | Freq: Two times a day (BID) | ORAL | 0 refills | Status: DC

## 2018-05-02 NOTE — Telephone Encounter (Signed)
Outgoing call to patient per Jordan John NP regarding recent advice report for [facility]- results of U/A culture back and let pt know they are treating her for UTI and need to get in 2 doses of Cipro today and tomorrow for surgery she will get IV antibiotics and when pt back home, she can resume taking remaining antibiotic as ordered. Pt voiced understanding and no other needs per pt at this time.

## 2018-05-02 NOTE — Telephone Encounter (Signed)
Called and spoke with Anderson Malta at [facility] office. Per Dr. Denman George, canceled appt for 11/6, will send patient back to her after surgery and post op appts

## 2018-05-02 NOTE — Progress Notes (Signed)
See RN note.

## 2018-05-02 NOTE — Progress Notes (Signed)
URINALYSIS ROUTED VIA Epic TO DR EMMA ROSSI

## 2018-05-03 ENCOUNTER — Ambulatory Visit (HOSPITAL_COMMUNITY): Payer: Medicare Other | Admitting: Certified Registered"

## 2018-05-03 ENCOUNTER — Encounter (HOSPITAL_COMMUNITY)
Admission: RE | Disposition: A | Discharge status: Home / Self Care | Payer: Self-pay | Source: Ambulatory Visit | Attending: Gynecologic Oncology

## 2018-05-03 ENCOUNTER — Encounter: Payer: Self-pay | Admitting: Obstetrics & Gynecology

## 2018-05-03 ENCOUNTER — Encounter (HOSPITAL_COMMUNITY): Payer: Self-pay | Admitting: General Practice

## 2018-05-03 ENCOUNTER — Ambulatory Visit (HOSPITAL_COMMUNITY)
Admission: RE | Admit: 2018-05-03 | Discharge: 2018-05-04 | Disposition: A | Discharge status: Home / Self Care | Payer: Medicare Other | Source: Ambulatory Visit | Attending: Gynecologic Oncology | Admitting: Gynecologic Oncology

## 2018-05-03 DIAGNOSIS — E7800 Pure hypercholesterolemia, unspecified: Secondary | ICD-10-CM | POA: Diagnosis not present

## 2018-05-03 DIAGNOSIS — Z7982 Long term (current) use of aspirin: Secondary | ICD-10-CM | POA: Insufficient documentation

## 2018-05-03 DIAGNOSIS — K219 Gastro-esophageal reflux disease without esophagitis: Secondary | ICD-10-CM | POA: Diagnosis not present

## 2018-05-03 DIAGNOSIS — E1169 Type 2 diabetes mellitus with other specified complication: Secondary | ICD-10-CM

## 2018-05-03 DIAGNOSIS — F419 Anxiety disorder, unspecified: Secondary | ICD-10-CM | POA: Diagnosis not present

## 2018-05-03 DIAGNOSIS — K760 Fatty (change of) liver, not elsewhere classified: Secondary | ICD-10-CM | POA: Diagnosis not present

## 2018-05-03 DIAGNOSIS — E119 Type 2 diabetes mellitus without complications: Secondary | ICD-10-CM | POA: Diagnosis not present

## 2018-05-03 DIAGNOSIS — N390 Urinary tract infection, site not specified: Secondary | ICD-10-CM | POA: Diagnosis present

## 2018-05-03 DIAGNOSIS — Z79899 Other long term (current) drug therapy: Secondary | ICD-10-CM | POA: Insufficient documentation

## 2018-05-03 DIAGNOSIS — C786 Secondary malignant neoplasm of retroperitoneum and peritoneum: Secondary | ICD-10-CM | POA: Insufficient documentation

## 2018-05-03 DIAGNOSIS — Z888 Allergy status to other drugs, medicaments and biological substances status: Secondary | ICD-10-CM | POA: Insufficient documentation

## 2018-05-03 DIAGNOSIS — N950 Postmenopausal bleeding: Secondary | ICD-10-CM | POA: Diagnosis not present

## 2018-05-03 DIAGNOSIS — C541 Malignant neoplasm of endometrium: Secondary | ICD-10-CM | POA: Insufficient documentation

## 2018-05-03 DIAGNOSIS — F329 Major depressive disorder, single episode, unspecified: Secondary | ICD-10-CM | POA: Diagnosis not present

## 2018-05-03 DIAGNOSIS — I700 Atherosclerosis of aorta: Secondary | ICD-10-CM | POA: Diagnosis not present

## 2018-05-03 DIAGNOSIS — E669 Obesity, unspecified: Secondary | ICD-10-CM

## 2018-05-03 DIAGNOSIS — I10 Essential (primary) hypertension: Secondary | ICD-10-CM | POA: Insufficient documentation

## 2018-05-03 HISTORY — PX: ROBOTIC ASSISTED TOTAL HYSTERECTOMY WITH BILATERAL SALPINGO OOPHERECTOMY: SHX6086

## 2018-05-03 HISTORY — PX: SENTINEL NODE BIOPSY: SHX6608

## 2018-05-03 LAB — TYPE AND SCREEN
ABO/RH(D): A POS
ANTIBODY SCREEN: NEGATIVE

## 2018-05-03 LAB — GLUCOSE, CAPILLARY
GLUCOSE-CAPILLARY: 232 mg/dL — AB (ref 70–99)
Glucose-Capillary: 150 mg/dL — ABNORMAL HIGH (ref 70–99)
Glucose-Capillary: 167 mg/dL — ABNORMAL HIGH (ref 70–99)
Glucose-Capillary: 313 mg/dL — ABNORMAL HIGH (ref 70–99)

## 2018-05-03 LAB — CA 125: Cancer Antigen (CA) 125: 65.3 U/mL — ABNORMAL HIGH (ref 0.0–38.1)

## 2018-05-03 SURGERY — HYSTERECTOMY, TOTAL, ROBOT-ASSISTED, LAPAROSCOPIC, WITH BILATERAL SALPINGO-OOPHORECTOMY
Anesthesia: General

## 2018-05-03 MED ORDER — HYDROMORPHONE HCL 1 MG/ML IJ SOLN
0.2500 mg | INTRAMUSCULAR | Status: DC | PRN
  Administered 2018-05-03 (×4): 0.5 mg via INTRAVENOUS

## 2018-05-03 MED ORDER — ONDANSETRON HCL 4 MG/2ML IJ SOLN
INTRAMUSCULAR | Status: AC
  Filled 2018-05-03: qty 2

## 2018-05-03 MED ORDER — DEXAMETHASONE SODIUM PHOSPHATE 10 MG/ML IJ SOLN
INTRAMUSCULAR | Status: AC
  Filled 2018-05-03: qty 1

## 2018-05-03 MED ORDER — MEPERIDINE HCL 50 MG/ML IJ SOLN
6.2500 mg | INTRAMUSCULAR | Status: DC | PRN
  Administered 2018-05-03: 12.5 mg via INTRAVENOUS

## 2018-05-03 MED ORDER — OXYCODONE HCL 5 MG PO TABS: 5.0000 mg | ORAL_TABLET | Freq: Once | ORAL | Status: DC | PRN

## 2018-05-03 MED ORDER — LACTATED RINGERS IV SOLN
Freq: Once | INTRAVENOUS | Status: AC
  Administered 2018-05-03: 10:00:00 via INTRAVENOUS

## 2018-05-03 MED ORDER — ROCURONIUM BROMIDE 10 MG/ML (PF) SYRINGE
PREFILLED_SYRINGE | INTRAVENOUS | Status: DC | PRN
  Administered 2018-05-03: 60 mg via INTRAVENOUS
  Administered 2018-05-03: 10 mg via INTRAVENOUS

## 2018-05-03 MED ORDER — ATORVASTATIN CALCIUM 20 MG PO TABS
20.0000 mg | ORAL_TABLET | Freq: Every day | ORAL | Status: DC
  Administered 2018-05-03: 20 mg via ORAL
  Filled 2018-05-03: qty 1

## 2018-05-03 MED ORDER — SUGAMMADEX SODIUM 500 MG/5ML IV SOLN
INTRAVENOUS | Status: AC
  Filled 2018-05-03: qty 5

## 2018-05-03 MED ORDER — SENNOSIDES-DOCUSATE SODIUM 8.6-50 MG PO TABS
2.0000 | ORAL_TABLET | Freq: Every day | ORAL | Status: DC
  Administered 2018-05-03: 2 via ORAL
  Filled 2018-05-03: qty 2

## 2018-05-03 MED ORDER — SUGAMMADEX SODIUM 500 MG/5ML IV SOLN
INTRAVENOUS | Status: DC | PRN
  Administered 2018-05-03: 400 mg via INTRAVENOUS

## 2018-05-03 MED ORDER — ONDANSETRON HCL 4 MG/2ML IJ SOLN: 4.0000 mg | Freq: Four times a day (QID) | INTRAMUSCULAR | Status: DC | PRN

## 2018-05-03 MED ORDER — KCL IN DEXTROSE-NACL 20-5-0.45 MEQ/L-%-% IV SOLN
INTRAVENOUS | Status: DC
  Administered 2018-05-03: 18:00:00 via INTRAVENOUS
  Filled 2018-05-03 (×2): qty 1000

## 2018-05-03 MED ORDER — OXYCODONE HCL 5 MG PO TABS
5.0000 mg | ORAL_TABLET | ORAL | Status: DC | PRN
  Administered 2018-05-03 – 2018-05-04 (×4): 5 mg via ORAL
  Filled 2018-05-03 (×4): qty 1

## 2018-05-03 MED ORDER — ACETAMINOPHEN 500 MG PO TABS
1000.0000 mg | ORAL_TABLET | Freq: Four times a day (QID) | ORAL | Status: DC
  Administered 2018-05-03 – 2018-05-04 (×4): 1000 mg via ORAL
  Filled 2018-05-03 (×4): qty 2

## 2018-05-03 MED ORDER — HYDROMORPHONE HCL 1 MG/ML IJ SOLN: 0.5000 mg | INTRAMUSCULAR | Status: DC | PRN

## 2018-05-03 MED ORDER — PROPOFOL 10 MG/ML IV BOLUS
INTRAVENOUS | Status: AC
  Filled 2018-05-03: qty 20

## 2018-05-03 MED ORDER — STERILE WATER FOR IRRIGATION IR SOLN
Status: DC | PRN
  Administered 2018-05-03: 1000 mL

## 2018-05-03 MED ORDER — HYDROMORPHONE HCL 1 MG/ML IJ SOLN
INTRAMUSCULAR | Status: AC
  Filled 2018-05-03: qty 1

## 2018-05-03 MED ORDER — TRAMADOL HCL 50 MG PO TABS
100.0000 mg | ORAL_TABLET | Freq: Two times a day (BID) | ORAL | Status: DC | PRN
  Administered 2018-05-03: 100 mg via ORAL
  Filled 2018-05-03: qty 2

## 2018-05-03 MED ORDER — SCOPOLAMINE 1 MG/3DAYS TD PT72
1.0000 | MEDICATED_PATCH | TRANSDERMAL | Status: DC
  Administered 2018-05-03: 1.5 mg via TRANSDERMAL
  Filled 2018-05-03: qty 1

## 2018-05-03 MED ORDER — LISINOPRIL-HYDROCHLOROTHIAZIDE 10-12.5 MG PO TABS: 1.0000 | ORAL_TABLET | Freq: Every day | ORAL | Status: DC

## 2018-05-03 MED ORDER — ONDANSETRON HCL 4 MG PO TABS: 4.0000 mg | ORAL_TABLET | Freq: Four times a day (QID) | ORAL | Status: DC | PRN

## 2018-05-03 MED ORDER — LISINOPRIL 10 MG PO TABS
10.0000 mg | ORAL_TABLET | Freq: Every day | ORAL | Status: DC
  Filled 2018-05-03: qty 1

## 2018-05-03 MED ORDER — CEFAZOLIN SODIUM-DEXTROSE 2-4 GM/100ML-% IV SOLN
2.0000 g | INTRAVENOUS | Status: AC
  Administered 2018-05-03: 2 g via INTRAVENOUS
  Filled 2018-05-03: qty 100

## 2018-05-03 MED ORDER — OXYCODONE HCL 5 MG/5ML PO SOLN
5.0000 mg | Freq: Once | ORAL | Status: DC | PRN
  Filled 2018-05-03: qty 5

## 2018-05-03 MED ORDER — ACETAMINOPHEN 500 MG PO TABS
1000.0000 mg | ORAL_TABLET | ORAL | Status: AC
  Administered 2018-05-03: 1000 mg via ORAL
  Filled 2018-05-03: qty 2

## 2018-05-03 MED ORDER — MIDAZOLAM HCL 5 MG/5ML IJ SOLN
INTRAMUSCULAR | Status: DC | PRN
  Administered 2018-05-03: 2 mg via INTRAVENOUS

## 2018-05-03 MED ORDER — FENTANYL CITRATE (PF) 100 MCG/2ML IJ SOLN
INTRAMUSCULAR | Status: DC | PRN
  Administered 2018-05-03: 100 ug via INTRAVENOUS
  Administered 2018-05-03 (×3): 50 ug via INTRAVENOUS

## 2018-05-03 MED ORDER — KETOROLAC TROMETHAMINE 30 MG/ML IJ SOLN
INTRAMUSCULAR | Status: AC
  Filled 2018-05-03: qty 1

## 2018-05-03 MED ORDER — HYDROCHLOROTHIAZIDE 12.5 MG PO CAPS
12.5000 mg | ORAL_CAPSULE | Freq: Every day | ORAL | Status: DC
  Administered 2018-05-04: 12.5 mg via ORAL
  Filled 2018-05-03: qty 1

## 2018-05-03 MED ORDER — STERILE WATER FOR INJECTION IJ SOLN
INTRAMUSCULAR | Status: AC
  Filled 2018-05-03: qty 10

## 2018-05-03 MED ORDER — KETOROLAC TROMETHAMINE 30 MG/ML IJ SOLN
30.0000 mg | Freq: Once | INTRAMUSCULAR | Status: AC
  Administered 2018-05-03: 30 mg via INTRAVENOUS

## 2018-05-03 MED ORDER — ONDANSETRON HCL 4 MG/2ML IJ SOLN
INTRAMUSCULAR | Status: DC | PRN
  Administered 2018-05-03: 4 mg via INTRAVENOUS

## 2018-05-03 MED ORDER — LACTATED RINGERS IV SOLN
INTRAVENOUS | Status: DC | PRN
  Administered 2018-05-03 (×2): via INTRAVENOUS

## 2018-05-03 MED ORDER — MIDAZOLAM HCL 2 MG/2ML IJ SOLN
INTRAMUSCULAR | Status: AC
  Filled 2018-05-03: qty 2

## 2018-05-03 MED ORDER — LACTATED RINGERS IR SOLN
Status: DC | PRN
  Administered 2018-05-03: 1000 mL

## 2018-05-03 MED ORDER — FENTANYL CITRATE (PF) 250 MCG/5ML IJ SOLN
INTRAMUSCULAR | Status: AC
  Filled 2018-05-03: qty 5

## 2018-05-03 MED ORDER — LIDOCAINE 2% (20 MG/ML) 5 ML SYRINGE
INTRAMUSCULAR | Status: AC
  Filled 2018-05-03: qty 5

## 2018-05-03 MED ORDER — KETAMINE HCL 10 MG/ML IJ SOLN
INTRAMUSCULAR | Status: DC | PRN
  Administered 2018-05-03: 25 mg via INTRAVENOUS

## 2018-05-03 MED ORDER — PROMETHAZINE HCL 25 MG/ML IJ SOLN: 6.2500 mg | INTRAMUSCULAR | Status: DC | PRN

## 2018-05-03 MED ORDER — PROPOFOL 10 MG/ML IV BOLUS
INTRAVENOUS | Status: DC | PRN
  Administered 2018-05-03: 160 mg via INTRAVENOUS

## 2018-05-03 MED ORDER — CELECOXIB 200 MG PO CAPS
200.0000 mg | ORAL_CAPSULE | Freq: Two times a day (BID) | ORAL | Status: DC
  Administered 2018-05-03 – 2018-05-04 (×2): 200 mg via ORAL
  Filled 2018-05-03 (×2): qty 1

## 2018-05-03 MED ORDER — ROCURONIUM BROMIDE 10 MG/ML (PF) SYRINGE
PREFILLED_SYRINGE | INTRAVENOUS | Status: AC
  Filled 2018-05-03: qty 10

## 2018-05-03 MED ORDER — PANTOPRAZOLE SODIUM 40 MG PO TBEC
40.0000 mg | DELAYED_RELEASE_TABLET | Freq: Every day | ORAL | Status: DC
  Administered 2018-05-04: 40 mg via ORAL
  Filled 2018-05-03: qty 1

## 2018-05-03 MED ORDER — ENOXAPARIN SODIUM 40 MG/0.4ML ~~LOC~~ SOLN
40.0000 mg | SUBCUTANEOUS | Status: DC
  Administered 2018-05-04: 40 mg via SUBCUTANEOUS
  Filled 2018-05-03: qty 0.4

## 2018-05-03 MED ORDER — ENOXAPARIN SODIUM 40 MG/0.4ML ~~LOC~~ SOLN
40.0000 mg | SUBCUTANEOUS | Status: AC
  Administered 2018-05-03: 40 mg via SUBCUTANEOUS
  Filled 2018-05-03: qty 0.4

## 2018-05-03 MED ORDER — DEXAMETHASONE SODIUM PHOSPHATE 4 MG/ML IJ SOLN
4.0000 mg | INTRAMUSCULAR | Status: AC
  Administered 2018-05-03: 8 mg via INTRAVENOUS

## 2018-05-03 MED ORDER — MEPERIDINE HCL 50 MG/ML IJ SOLN
INTRAMUSCULAR | Status: AC
  Filled 2018-05-03: qty 1

## 2018-05-03 MED ORDER — ATENOLOL 50 MG PO TABS
100.0000 mg | ORAL_TABLET | Freq: Every day | ORAL | Status: DC
  Administered 2018-05-03: 100 mg via ORAL
  Filled 2018-05-03: qty 2

## 2018-05-03 SURGICAL SUPPLY — 47 items
APPLICATOR SURGIFLO ENDO (HEMOSTASIS) IMPLANT
BAG LAPAROSCOPIC 12 15 PORT 16 (BASKET) IMPLANT
BAG RETRIEVAL 12/15 (BASKET)
COVER BACK TABLE 60X90IN (DRAPES) ×2 IMPLANT
COVER TIP SHEARS 8 DVNC (MISCELLANEOUS) ×1 IMPLANT
COVER TIP SHEARS 8MM DA VINCI (MISCELLANEOUS) ×1
COVER WAND RF STERILE (DRAPES) ×2 IMPLANT
DERMABOND ADVANCED (GAUZE/BANDAGES/DRESSINGS) ×1
DERMABOND ADVANCED .7 DNX12 (GAUZE/BANDAGES/DRESSINGS) ×1 IMPLANT
DRAPE ARM DVNC X/XI (DISPOSABLE) ×4 IMPLANT
DRAPE COLUMN DVNC XI (DISPOSABLE) ×1 IMPLANT
DRAPE DA VINCI XI ARM (DISPOSABLE) ×4
DRAPE DA VINCI XI COLUMN (DISPOSABLE) ×1
DRAPE SHEET LG 3/4 BI-LAMINATE (DRAPES) ×2 IMPLANT
DRAPE SURG IRRIG POUCH 19X23 (DRAPES) ×2 IMPLANT
ELECT REM PT RETURN 15FT ADLT (MISCELLANEOUS) ×2 IMPLANT
GLOVE BIO SURGEON STRL SZ 6 (GLOVE) ×8 IMPLANT
GLOVE BIO SURGEON STRL SZ 6.5 (GLOVE) IMPLANT
GOWN STRL REUS W/ TWL LRG LVL3 (GOWN DISPOSABLE) ×2 IMPLANT
GOWN STRL REUS W/TWL LRG LVL3 (GOWN DISPOSABLE) ×2
HOLDER FOLEY CATH W/STRAP (MISCELLANEOUS) ×2 IMPLANT
IRRIG SUCT STRYKERFLOW 2 WTIP (MISCELLANEOUS) ×2
IRRIGATION SUCT STRKRFLW 2 WTP (MISCELLANEOUS) ×1 IMPLANT
KIT PROCEDURE DA VINCI SI (MISCELLANEOUS)
KIT PROCEDURE DVNC SI (MISCELLANEOUS) IMPLANT
MANIPULATOR UTERINE 4.5 ZUMI (MISCELLANEOUS) ×2 IMPLANT
NEEDLE SPNL 18GX3.5 QUINCKE PK (NEEDLE) IMPLANT
OBTURATOR OPTICAL STANDARD 8MM (TROCAR) ×1
OBTURATOR OPTICAL STND 8 DVNC (TROCAR) ×1
OBTURATOR OPTICALSTD 8 DVNC (TROCAR) ×1 IMPLANT
PACK ROBOT GYN CUSTOM WL (TRAY / TRAY PROCEDURE) ×2 IMPLANT
PAD POSITIONING PINK XL (MISCELLANEOUS) ×2 IMPLANT
PORT ACCESS TROCAR AIRSEAL 12 (TROCAR) ×1 IMPLANT
PORT ACCESS TROCAR AIRSEAL 5M (TROCAR) ×1
POUCH SPECIMEN RETRIEVAL 10MM (ENDOMECHANICALS) IMPLANT
SEAL CANN UNIV 5-8 DVNC XI (MISCELLANEOUS) ×4 IMPLANT
SEAL XI 5MM-8MM UNIVERSAL (MISCELLANEOUS) ×4
SET TRI-LUMEN FLTR TB AIRSEAL (TUBING) ×2 IMPLANT
SURGIFLO W/THROMBIN 8M KIT (HEMOSTASIS) IMPLANT
SUT VIC AB 0 CT1 27 (SUTURE)
SUT VIC AB 0 CT1 27XBRD ANTBC (SUTURE) IMPLANT
SYR 10ML LL (SYRINGE) IMPLANT
TOWEL OR NON WOVEN STRL DISP B (DISPOSABLE) ×2 IMPLANT
TRAP SPECIMEN MUCOUS 40CC (MISCELLANEOUS) IMPLANT
TRAY FOLEY MTR SLVR 16FR STAT (SET/KITS/TRAYS/PACK) ×2 IMPLANT
UNDERPAD 30X30 (UNDERPADS AND DIAPERS) ×2 IMPLANT
WATER STERILE IRR 1000ML POUR (IV SOLUTION) ×2 IMPLANT

## 2018-05-03 NOTE — Op Note (Signed)
OPERATIVE NOTE 05/03/18  Surgeon: Donaciano Eva   Assistants: Dr Lahoma Crocker (an MD assistant was necessary for tissue manipulation, management of robotic instrumentation, retraction and positioning due to the complexity of the case and hospital policies).   Anesthesia: General endotracheal anesthesia  ASA Class: 3   Pre-operative Diagnosis: endometrial cancer grade 3  Post-operative Diagnosis: same,   Operation: Robotic-assisted laparoscopic total hysterectomy with bilateral salpingoophorectomy, SLN biopsy   Surgeon: Donaciano Eva  Assistant Surgeon: Lahoma Crocker MD  Anesthesia: GET  Urine Output: 175  Operative Findings:  : 6cm normal appearing uterus. Normal tubes and ovaries. Normal omentum and diaphragm. No suspicious nodes.   Estimated Blood Loss:  25cc      Total IV Fluids: 700 ml         Specimens: uterus, cervix, bilateral tubes and ovaries, right obturator SLN, left external iliac SLN, pelvic washings         Complications:  None; patient tolerated the procedure well.         Disposition: PACU - hemodynamically stable.  Procedure Details  The patient was seen in the Holding Room. The risks, benefits, complications, treatment options, and expected outcomes were discussed with the patient.  The patient concurred with the proposed plan, giving informed consent.  The site of surgery properly noted/marked. The patient was identified as Jordan Martinez and the procedure verified as a Robotic-assisted hysterectomy with bilateral salpingo oophorectomy with SLN biopsy. A Time Out was held and the above information confirmed.  After induction of anesthesia, the patient was draped and prepped in the usual sterile manner. Pt was placed in supine position after anesthesia and draped and prepped in the usual sterile manner. The abdominal drape was placed after the CholoraPrep had been allowed to dry for 3 minutes.  Her arms were tucked to her side with  all appropriate precautions.  The shoulders were stabilized with padded shoulder blocks applied to the acromium processes.  The patient was placed in the semi-lithotomy position in [place].  The perineum was prepped with Betadine. The patient was then prepped. Foley catheter was placed.  A sterile speculum was placed in the vagina.  The cervix was grasped with a single-tooth tenaculum. 2mg  total of ICG was injected into the cervical stroma at 2 and 9 o'clock with 1cc injected at a 1cm and 85mm depth (concentration 0.5mg /ml) in all locations. The cervix was dilated with Kennon Rounds dilators.  The ZUMI uterine manipulator with a medium colpotomizer ring was placed without difficulty.  A pneum occluder balloon was placed over the manipulator.  OG tube placement was confirmed and to suction.   Next, a 5 mm skin incision was made 1 cm below the subcostal margin in the midclavicular line.  The 5 mm Optiview port and scope was used for direct entry.  Opening pressure was under 10 mm CO2.  The abdomen was insufflated and the findings were noted as above.   At this point and all points during the procedure, the patient's intra-abdominal pressure did not exceed 15 mmHg. Next, a 10 mm skin incision was made in the umbilicus and a right and left port was placed about 10 cm lateral to the robot port on the right and left side.  A fourth arm was placed in the left lower quadrant 2 cm above and superior and medial to the anterior superior iliac spine.  All ports were placed under direct visualization.  The patient was placed in steep Trendelenburg.  Bowel was folded  away into the upper abdomen.  The robot was docked in the normal manner.  The right and left peritoneum were opened parallel to the IP ligament to open the retroperitoneal spaces bilaterally. The SLN mapping was performed in bilateral pelvic basins. The para rectal and paravesical spaces were opened up entirely with careful dissection below the level of the ureters  bilaterally and to the depth of the uterine artery origin in order to skeletonize the uterine "web" and ensure visualization of all parametrial channels. The para-aortic basins were carefully exposed and evaluated for isolated para-aortic SLN's. Lymphatic channels were identified travelling to the following visualized sentinel lymph node's: right obturator SLN, left external iliac SLN. These SLN's were separated from their surrounding lymphatic tissue, removed and sent for permanent pathology.  The hysterectomy was started after the round ligament on the right side was incised and the retroperitoneum was entered and the pararectal space was developed.  The ureter was noted to be on the medial leaf of the broad ligament.  The peritoneum above the ureter was incised and stretched and the infundibulopelvic ligament was skeletonized, cauterized and cut.  The posterior peritoneum was taken down to the level of the KOH ring.  The anterior peritoneum was also taken down.  The bladder flap was created to the level of the KOH ring.  The uterine artery on the right side was skeletonized, cauterized and cut in the normal manner.  A similar procedure was performed on the left.  The colpotomy was made and the uterus, cervix, bilateral ovaries and tubes were amputated and delivered through the vagina.  Pedicles were inspected and excellent hemostasis was achieved.    The colpotomy at the vaginal cuff was closed with Vicryl on a CT1 needle in a running manner.  Irrigation was used and excellent hemostasis was achieved.  At this point in the procedure was completed.  Robotic instruments were removed under direct visulaization.  The robot was undocked. The 10 mm ports were closed with Vicryl on a UR-5 needle and the fascia was closed with 0 Vicryl on a UR-5 needle.  The skin was closed with 4-0 Vicryl in a subcuticular manner.  Dermabond was applied.  Sponge, lap and needle counts correct x 2.  The patient was taken to the  recovery room in stable condition.  The vagina was swabbed with  minimal bleeding noted.   All instrument and needle counts were correct x  3.   The patient was transferred to the recovery room in a stable condition.  Donaciano Eva, MD

## 2018-05-03 NOTE — Anesthesia Procedure Notes (Signed)
Procedure Name: Intubation Date/Time: 05/03/2018 1:24 PM Performed by: Berlene Dixson D, CRNA Pre-anesthesia Checklist: Patient identified, Emergency Drugs available, Suction available and Patient being monitored Patient Re-evaluated:Patient Re-evaluated prior to induction Oxygen Delivery Method: Circle system utilized Preoxygenation: Pre-oxygenation with 100% oxygen Induction Type: IV induction Ventilation: Mask ventilation without difficulty Laryngoscope Size: Mac and 4 Grade View: Grade I Tube type: Oral Tube size: 7.5 mm Number of attempts: 1 Airway Equipment and Method: Stylet Placement Confirmation: ETT inserted through vocal cords under direct vision,  positive ETCO2 and breath sounds checked- equal and bilateral Tube secured with: Tape Dental Injury: Teeth and Oropharynx as per pre-operative assessment

## 2018-05-03 NOTE — Transfer of Care (Signed)
Immediate Anesthesia Transfer of Care Note  Patient: Jordan Martinez  Procedure(s) Performed: XI ROBOTIC ASSISTED TOTAL HYSTERECTOMY WITH BILATERAL SALPINGO OOPHORECTOMY (N/A ) SENTINEL NODE BIOPSY (N/A )  Patient Location: PACU  Anesthesia Type:General  Level of Consciousness: awake, alert  and oriented  Airway & Oxygen Therapy: Patient Spontanous Breathing and Patient connected to face mask oxygen  Post-op Assessment: Report given to RN and Post -op Vital signs reviewed and stable  Post vital signs: Reviewed and stable  Last Vitals:  Vitals Value Taken Time  BP    Temp    Pulse 71 05/03/2018  2:56 PM  Resp 14 05/03/2018  2:56 PM  SpO2 93 % 05/03/2018  2:56 PM  Vitals shown include unvalidated device data.  Last Pain:  Vitals:   05/03/18 0937  PainSc: 0-No pain         Complications: No apparent anesthesia complications

## 2018-05-03 NOTE — Anesthesia Preprocedure Evaluation (Signed)
Anesthesia Evaluation  Patient identified by MRN, date of birth, ID band Patient awake    Reviewed: Allergy & Precautions, NPO status , Patient's Chart, lab work & pertinent test results  Airway Mallampati: II  TM Distance: >3 FB Neck ROM: Full    Dental no notable dental hx.    Pulmonary Current Smoker,    Pulmonary exam normal breath sounds clear to auscultation       Cardiovascular hypertension, Pt. on medications and Pt. on home beta blockers Normal cardiovascular exam Rhythm:Regular Rate:Normal     Neuro/Psych Anxiety negative neurological ROS     GI/Hepatic Neg liver ROS, GERD  ,  Endo/Other  diabetes  Renal/GU negative Renal ROS  negative genitourinary   Musculoskeletal negative musculoskeletal ROS (+)   Abdominal   Peds negative pediatric ROS (+)  Hematology negative hematology ROS (+)   Anesthesia Other Findings   Reproductive/Obstetrics negative OB ROS                             Anesthesia Physical Anesthesia Plan  ASA: II  Anesthesia Plan: General   Post-op Pain Management:    Induction: Intravenous  PONV Risk Score and Plan: 2 and 3 and Ondansetron, Dexamethasone and Treatment may vary due to age or medical condition  Airway Management Planned: Oral ETT  Additional Equipment:   Intra-op Plan:   Post-operative Plan: Extubation in OR  Informed Consent: I have reviewed the patients History and Physical, chart, labs and discussed the procedure including the risks, benefits and alternatives for the proposed anesthesia with the patient or authorized representative who has indicated his/her understanding and acceptance.   Dental advisory given  Plan Discussed with: CRNA and Surgeon  Anesthesia Plan Comments:         Anesthesia Quick Evaluation

## 2018-05-03 NOTE — Anesthesia Postprocedure Evaluation (Signed)
Anesthesia Post Note  Patient: Jordan Martinez  Procedure(s) Performed: XI ROBOTIC ASSISTED TOTAL HYSTERECTOMY WITH BILATERAL SALPINGO OOPHORECTOMY (N/A ) SENTINEL NODE BIOPSY (N/A )     Patient location during evaluation: PACU Anesthesia Type: General Level of consciousness: awake and alert Pain management: pain level controlled Vital Signs Assessment: post-procedure vital signs reviewed and stable Respiratory status: spontaneous breathing, nonlabored ventilation, respiratory function stable and patient connected to nasal cannula oxygen Cardiovascular status: blood pressure returned to baseline and stable Postop Assessment: no apparent nausea or vomiting Anesthetic complications: no    Last Vitals:  Vitals:   05/03/18 1445  BP: (!) 124/57  Pulse: 69  Resp: 15  Temp: 36.6 C  SpO2: 94%    Last Pain:  Vitals:   05/03/18 1515  PainSc: Asleep                 Krist Rosenboom S

## 2018-05-03 NOTE — Interval H&P Note (Signed)
History and Physical Interval Note:  05/03/2018 10:11 AM  Jordan Martinez  has presented today for surgery, with the diagnosis of ENDOMETRIAL CANCER  The various methods of treatment have been discussed with the patient and family. After consideration of risks, benefits and other options for treatment, the patient has consented to  Procedure(s): XI ROBOTIC ASSISTED TOTAL HYSTERECTOMY WITH BILATERAL SALPINGO OOPHORECTOMY (N/A) SENTINEL NODE BIOPSY, POSSIBLE STAGING (N/A) as a surgical intervention .  The patient's history has been reviewed, patient examined, no change in status, stable for surgery. She was diagnosed with a preop urinary tract infection and started on ciprofloxacin as an outpatient.  I have reviewed the patient's chart and labs.  Questions were answered to the patient's satisfaction.     Thereasa Solo

## 2018-05-04 ENCOUNTER — Encounter (HOSPITAL_COMMUNITY): Payer: Self-pay | Admitting: Gynecologic Oncology

## 2018-05-04 DIAGNOSIS — C541 Malignant neoplasm of endometrium: Secondary | ICD-10-CM | POA: Diagnosis not present

## 2018-05-04 LAB — CBC
HEMATOCRIT: 38.4 % (ref 36.0–46.0)
HEMOGLOBIN: 12.1 g/dL (ref 12.0–15.0)
MCH: 31.8 pg (ref 26.0–34.0)
MCHC: 31.5 g/dL (ref 30.0–36.0)
MCV: 100.8 fL — AB (ref 80.0–100.0)
Platelets: 210 10*3/uL (ref 150–400)
RBC: 3.81 MIL/uL — ABNORMAL LOW (ref 3.87–5.11)
RDW: 12.8 % (ref 11.5–15.5)
WBC: 10.6 10*3/uL — AB (ref 4.0–10.5)
nRBC: 0 % (ref 0.0–0.2)

## 2018-05-04 LAB — BASIC METABOLIC PANEL
ANION GAP: 5 (ref 5–15)
BUN: 32 mg/dL — ABNORMAL HIGH (ref 8–23)
CHLORIDE: 107 mmol/L (ref 98–111)
CO2: 25 mmol/L (ref 22–32)
Calcium: 8.3 mg/dL — ABNORMAL LOW (ref 8.9–10.3)
Creatinine, Ser: 1.03 mg/dL — ABNORMAL HIGH (ref 0.44–1.00)
GFR calc Af Amer: >60 mL/min (ref >60)
GFR calc non Af Amer: 55 mL/min — ABNORMAL LOW (ref >60)
GLUCOSE: 206 mg/dL — AB (ref 70–99)
Potassium: 4 mmol/L (ref 3.5–5.1)
Sodium: 137 mmol/L (ref 135–145)

## 2018-05-04 LAB — GLUCOSE, CAPILLARY
Glucose-Capillary: 231 mg/dL — ABNORMAL HIGH (ref 70–99)
Glucose-Capillary: 157 mg/dL — ABNORMAL HIGH (ref 70–99)

## 2018-05-04 MED ORDER — PANTOPRAZOLE SODIUM 40 MG PO TBEC: 40.0000 mg | DELAYED_RELEASE_TABLET | Freq: Every day | ORAL | Status: DC

## 2018-05-04 MED ORDER — OXYCODONE HCL 5 MG PO TABS: 5.0000 mg | ORAL_TABLET | ORAL | 0 refills | Status: DC | PRN

## 2018-05-04 MED ORDER — CIPROFLOXACIN HCL 500 MG PO TABS
250.0000 mg | ORAL_TABLET | Freq: Two times a day (BID) | ORAL | Status: DC
  Administered 2018-05-04: 250 mg via ORAL
  Filled 2018-05-04: qty 1

## 2018-05-04 NOTE — Discharge Summary (Addendum)
Physician Discharge Summary  Patient ID: Jordan Martinez MRN: [id number] DOB/AGE: 67-Nov-1952 68 y.o.  Admit date: 05/03/2018 Discharge date: 05/04/2018  Admission Diagnoses: Endometrial cancer [facility])  Discharge Diagnoses:  Principal Problem:   Endometrial cancer [facility]) Active Problems:   Urinary tract infection   Discharged Condition:  The patient is in good condition and stable for discharge.    Hospital Course: On 05/03/2018, the patient underwent the following: Procedure(s): XI ROBOTIC ASSISTED TOTAL HYSTERECTOMY WITH BILATERAL SALPINGO OOPHORECTOMY SENTINEL NODE BIOPSY.  The postoperative course was uneventful.  She was discharged to home on postoperative day 1 tolerating a regular diet, ambulating, passing flatus, pain controlled with oral medications, voiding.   Consults: None  Significant Diagnostic Studies: None  Treatments: surgery: see above  Discharge Exam: Blood pressure (!) 113/48, pulse (!) 58, temperature 98 F (36.7 C), temperature source Oral, resp. rate 16, height 5' 4.5" (1.638 m), weight 199 lb (90.3 kg), SpO2 100 %. General appearance: alert, cooperative and no distress Resp: clear to auscultation bilaterally Cardio: regular rate and rhythm, S1, S2 normal, no murmur, click, rub or gallop GI: soft, non-tender; bowel sounds normal; no masses,  no organomegaly Extremities: extremities normal, atraumatic, no cyanosis or edema Incision/Wound: Lap sites to the abdomen with mild ecchymosis noted around the incisions with no active drainage or bleeding  Disposition: Home   Allergies as of 05/04/2018      Reactions   Amlodipine    Blurred vision    Compazine [prochlorperazine Edisylate]    Stroke symptoms,   Gabapentin    Blurred vision and nystagmus   Lyrica [pregabalin]    Blurred Vision    Sertraline Other (See Comments)   Visual disturbances.       Medication List    STOP taking these medications   aspirin EC 81 MG tablet     TAKE these  medications   atenolol 100 MG tablet Commonly known as:  TENORMIN Take 100 mg by mouth at bedtime.   atorvastatin 20 MG tablet Commonly known as:  LIPITOR Take 20 mg by mouth at bedtime.   celecoxib 200 MG capsule Commonly known as:  CELEBREX Take 200 mg by mouth 2 (two) times daily.   cholecalciferol 400 units Tabs tablet Commonly known as:  VITAMIN D Take 400 Units by mouth daily.   ciprofloxacin 250 MG tablet Commonly known as:  CIPRO Take 1 tablet (250 mg total) by mouth 2 (two) times daily.   cyclobenzaprine 5 MG tablet Commonly known as:  FLEXERIL Take 5 mg by mouth 3 (three) times daily.   docusate sodium 100 MG capsule Commonly known as:  COLACE Take 100 mg by mouth 2 (two) times daily.   Fish Oil 1200 MG Caps Take 1,200 mg by mouth daily.   lisinopril-hydrochlorothiazide 10-12.5 MG tablet Commonly known as:  PRINZIDE,ZESTORETIC Take 1 tablet by mouth daily.   omeprazole 20 MG capsule Commonly known as:  PRILOSEC Take 20 mg by mouth 2 (two) times daily.   oxyCODONE 5 MG immediate release tablet Commonly known as:  Oxy IR/ROXICODONE Take 1 tablet (5 mg total) by mouth every 4 (four) hours as needed for severe pain.   SYSTANE BALANCE 0.6 % Soln Generic drug:  Propylene Glycol Place 1 drop into both eyes every morning.      Follow-up Information    Everitt Amber, MD Follow up on 05/20/2018.   Specialty:  Gynecologic Oncology Contact information: [place] [address] [phone]  Greater than thirty minutes were spend for face to face discharge instructions and discharge orders/summary in EPIC.   Signed: Dorothyann Gibbs 05/04/2018, 10:47 AM

## 2018-05-04 NOTE — Plan of Care (Signed)
Patient discharged home in stable condition. IV dc'd. Discharge instructions given to patient and her daughter.

## 2018-05-04 NOTE — Discharge Instructions (Signed)
05/03/2018  Return to work: 4 weeks  Activity: 1. Be up and out of the bed during the day.  Take a nap if needed.  You may walk up steps but be careful and use the hand rail.  Stair climbing will tire you more than you think, you may need to stop part way and rest.   2. No lifting or straining for 4 weeks.  3. No driving for 1 weeks.  Do Not drive if you are taking narcotic pain medicine.  4. Shower daily.  Use soap and water on your incision and pat dry; don't rub.   5. No sexual activity and nothing in the vagina for 8 weeks.  Medications:  - Take tylenol first line for pain control. Take these regularly (every 6 hours) to decrease the build up of pain.  - If necessary, for severe pain not relieved by tylenol, take oxycodone.  - While taking oxycodone you should take sennakot (or Miralax and Colace) every night to reduce the likelihood of constipation. If this causes diarrhea, stop its use.  Diet: 1. Low sodium Heart Healthy Diet is recommended.  2. It is safe to use a laxative if you have difficulty moving your bowels.   Wound Care: 1. Keep clean and dry.  Shower daily.  Reasons to call the Doctor:   Fever - Oral temperature greater than 100.4 degrees Fahrenheit  Foul-smelling vaginal discharge  Difficulty urinating  Nausea and vomiting  Increased pain at the site of the incision that is unrelieved with pain medicine.  Difficulty breathing with or without chest pain  New calf pain especially if only on one side  Sudden, continuing increased vaginal bleeding with or without clots.   Follow-up: 1. See Everitt Amber in 3-4 weeks.  Contacts: For questions or concerns you should contact:  Dr. Everitt Amber at [phone]  After hours and on week-ends call [phone] and ask to speak to the physician on call for Gynecologic Oncology

## 2018-05-10 ENCOUNTER — Other Ambulatory Visit: Payer: Self-pay | Admitting: Gynecologic Oncology

## 2018-05-11 ENCOUNTER — Telehealth: Payer: Self-pay | Admitting: Gynecologic Oncology

## 2018-05-11 ENCOUNTER — Ambulatory Visit: Payer: Medicare Other | Admitting: Obstetrics & Gynecology

## 2018-05-11 DIAGNOSIS — R897 Abnormal histological findings in specimens from other organs, systems and tissues: Secondary | ICD-10-CM

## 2018-05-11 DIAGNOSIS — C541 Malignant neoplasm of endometrium: Secondary | ICD-10-CM

## 2018-05-11 NOTE — Telephone Encounter (Signed)
Informed patient of findings of stage IA grade 1 endometrial cancer but positive washings for metastatic adenocarcinoma.  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  Therefore I am recommending a prescription of carboplatin paclitaxel x6 cycles after she is healed from her surgery.  Given the MSI testing abnormalities and her daughter's history of Lynch syndrome I am recommending genetics consultation and screening.    She will see me back next week for postoperative evaluation.

## 2018-05-12 ENCOUNTER — Telehealth: Payer: Self-pay | Admitting: Oncology

## 2018-05-12 DIAGNOSIS — C541 Malignant neoplasm of endometrium: Secondary | ICD-10-CM

## 2018-05-12 NOTE — Telephone Encounter (Signed)
Called [place] and introduced myself as the Advice worker.  Informed her of appointment scheduled with Dr. Alvy Bimler on 06/10/18 at 11:30 am.  She verbalized understanding and agreement.

## 2018-05-12 NOTE — Telephone Encounter (Addendum)
Also notified [place] of appointment for genetic counseling on 05/18/18 at 56 am.

## 2018-05-12 NOTE — Telephone Encounter (Signed)
Called Kim in [facility] and requested for Dr. Tresa Moore to review entire fallopian tubes and ovaries because patient had abnormal MMR and 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.

## 2018-05-18 ENCOUNTER — Encounter: Payer: Self-pay | Admitting: Genetic Counselor

## 2018-05-18 ENCOUNTER — Inpatient Hospital Stay: Payer: Medicare Other | Attending: Obstetrics | Admitting: Genetic Counselor

## 2018-05-18 ENCOUNTER — Inpatient Hospital Stay: Payer: Medicare Other

## 2018-05-18 DIAGNOSIS — Z800 Family history of malignant neoplasm of digestive organs: Secondary | ICD-10-CM

## 2018-05-18 DIAGNOSIS — Z90722 Acquired absence of ovaries, bilateral: Secondary | ICD-10-CM | POA: Diagnosis not present

## 2018-05-18 DIAGNOSIS — Z90710 Acquired absence of both cervix and uterus: Secondary | ICD-10-CM | POA: Diagnosis not present

## 2018-05-18 DIAGNOSIS — C541 Malignant neoplasm of endometrium: Secondary | ICD-10-CM | POA: Diagnosis present

## 2018-05-18 DIAGNOSIS — N3000 Acute cystitis without hematuria: Secondary | ICD-10-CM | POA: Insufficient documentation

## 2018-05-18 DIAGNOSIS — Z8481 Family history of carrier of genetic disease: Secondary | ICD-10-CM

## 2018-05-18 NOTE — Progress Notes (Signed)
REFERRING PROVIDER: Lavonia Drafts, MD [place],  [address]  PRIMARY PROVIDER:  Javier Glazier, MD  PRIMARY REASON FOR VISIT:  1. Endometrial cancer ([place])   2. Family history of colon cancer   3. Family history of genetic mutation for hereditary nonpolyposis colorectal cancer (HNPCC)      HISTORY OF PRESENT ILLNESS:   Jordan Martinez, a 67 y.o. female, was seen for a [facility] cancer genetics consultation at the request of Dr. Ihor Dow due to a personal and family history of cancer.  Jordan Martinez presents to clinic today to discuss the possibility of a hereditary predisposition to cancer, genetic testing, and to further clarify her future cancer risks, as well as potential cancer risks for family members.   In 2019, at the age of 5, Jordan Martinez was diagnosed with endometrial cancer.  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 This was treated with a hysterectomy, and will be further treated with chemotherapy.      CANCER HISTORY:   No history exists.     HORMONAL RISK FACTORS:  Menarche was at age 18.  First live birth at age 66.  OCP use for approximately 13 years.  Ovaries intact: no.  Hysterectomy: yes.  Menopausal status: postmenopausal.  HRT use: 0 years. Colonoscopy: yes; a few polyps on her last colonoscopy. Mammogram within the last year: yes. Number of breast biopsies: 0. Up to date with pelvic exams:  yes. 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  Past Medical History:  Diagnosis Date  . Anxiety   . Arthritis   . Depression   . Diabetes mellitus without complication (HCC)    diet controlled. States always has borderline HgbA1C  . Endometrial cancer ([place])   . Family history of colon cancer   . Family history of genetic mutation for hereditary nonpolyposis colorectal cancer (HNPCC)   . GERD (gastroesophageal reflux disease)   . High cholesterol   .  History of kidney stones    years   . Hypertension   . Neuropathy   . Sensation of pressure in bladder area   . Uterine cramping     Past Surgical History:  Procedure Laterality Date  . burnt the nerves  Right 04/29/2018   in right knee   . burnt the nerves  Left 04/22/2018   left knee nerve   . cystoscopy kidney stone   1985  . REPLACEMENT TOTAL KNEE BILATERAL Bilateral 2018   Right x 2. 2019 undergoing intermittent injection "RFA" procedures with Dr. Sheliah Mends [facility]  . REVISION TOTAL KNEE ARTHROPLASTY Right 03/2017  . ROBOTIC ASSISTED TOTAL HYSTERECTOMY WITH BILATERAL SALPINGO OOPHERECTOMY N/A 05/03/2018   Procedure: XI ROBOTIC ASSISTED TOTAL HYSTERECTOMY WITH BILATERAL SALPINGO OOPHORECTOMY;  Surgeon: Everitt Amber, MD;  Location: [facility];  Service: Gynecology;  Laterality: N/A;  . SENTINEL NODE BIOPSY N/A 05/03/2018   Procedure: SENTINEL NODE BIOPSY;  Surgeon: Everitt Amber, MD;  Location: [facility];  Service: Gynecology;  Laterality: N/A;  . TONSILLECTOMY AND ADENOIDECTOMY    . TUBAL LIGATION      Social History   Socioeconomic History  . Marital status: Divorced    Spouse name: Not on file  . Number of children: Not on file  . Years of education: Not on file  . Highest education level: Not on file  Occupational History  . Not on file  Social Needs  .  Financial resource strain: Not on file  . Food insecurity:    Worry: Not on file    Inability: Not on file  . Transportation needs:    Medical: Not on file    Non-medical: Not on file  Tobacco Use  . Smoking status: Current Every Day Smoker    Types: Cigarettes  . Smokeless tobacco: Never Used  . Tobacco comment: 4-5 loose cigarettes per day , over 20 year hx of smoking   Substance and Sexual Activity  . Alcohol use: Yes    Comment: 2 glasses wine daily.   . Drug use: Never  . Sexual activity: Not Currently    Birth control/protection: None  Lifestyle  . Physical activity:    Days per week: Not on file     Minutes per session: Not on file  . Stress: Not on file  Relationships  . Social connections:    Talks on phone: Not on file    Gets together: Not on file    Attends religious service: Not on file    Active member of club or organization: Not on file    Attends meetings of clubs or organizations: Not on file    Relationship status: Not on file  Other Topics Concern  . Not on file  Social History Narrative  . Not on file     FAMILY HISTORY:  We obtained a detailed, 4-generation family history.  Significant diagnoses are listed below: Family History  Adopted: Yes  Problem Relation Age of Onset  . Colon cancer Daughter 56       Lynch syndrome - MSH6 +  . Healthy Son     The patient is adopted and does not know her biological family, other than she was adopted through [employer] and is probably [place].    She has two children.  Her daughter was diagnosed with colon cancer at age 52 and testing revealed an MSH6 mutation.  Her son had a colonoscopy recently and no polyps were found.  Jordan Martinez is unaware of previous family history of genetic testing for hereditary cancer risks. Patient's maternal ancestors are of possible [place] descent, and paternal ancestors are of possible [place] descent. There is no reported Ashkenazi Jewish ancestry. There is no known consanguinity.  GENETIC COUNSELING ASSESSMENT: Jordan Martinez is a 67 y.o. 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 We, therefore, discussed and recommended the following at today's visit.   DISCUSSION: We discussed that about 3-5% of uterine cancer is hereditary with most cases due to Lynch syndrome.  Genetic testing performed on her tumor suggests that there is a MSH6 mutation, as there was loss of MSH6 on IHC and the MSI was high.  The patient revealed that her daughter had genetic  testing after her diagnosis of colon cancer and was found to have an MSH6 mutation. We discussed that today's testing will most likely be confirmatory of Lynch syndrome.  The patient is concerned about her son and grandchildren.  We discussed that if her testing is positive, her relatives would have 30 days to undergo genetic testing through the same lab and have it performed for free. Her son and oldest granddaughter do not have health insurance.  We discussed the need for life insurance in individuals who undergo genetic testing.  We discussed that some people do not want to undergo genetic testing  due to fear of genetic discrimination.  A federal law called the Genetic Information Non-Discrimination Act (GINA) of 2008 helps protect individuals against genetic discrimination based on their genetic test results.  It impacts both health insurance and employment.  With health insurance, it protects against increased premiums, being kicked off insurance or being forced to take a test in order to be insured.  For employment it protects against hiring, firing and promoting decisions based on genetic test results.  Health status due to a cancer diagnosis is not protected under GINA.  Jordan Martinez asked whether she should undergo a colonoscopy before she starts chemotherapy.  She is to see Dr. Alvy Bimler on 06/10/2018.  It will be 2 years in January since her last colonoscopy.  I will check into this for her and let her know.   We reviewed the characteristics, features and inheritance patterns of hereditary cancer syndromes. We also discussed genetic testing, including the appropriate family members to test, the process of testing, insurance coverage and turn-around-time for results. We discussed the implications of a negative, positive and/or variant of uncertain significant result. In order to get genetic test results in a timely manner we recommended Jordan Martinez pursue genetic testing for the Lynch syndrome gene panel. If  this test is negative, we then recommend Jordan Martinez pursue reflex genetic testing to the Multi cancer gene panel. The Multi-Gene Panel offered by [facility] includes sequencing and/or deletion duplication testing of the following 84 genes: AIP, ALK, APC, ATM, AXIN2,BAP1,  BARD1, BLM, BMPR1A, BRCA1, BRCA2, BRIP1, CASR, CDC73, CDH1, CDK4, CDKN1B, CDKN1C, CDKN2A (p14ARF), CDKN2A (p16INK4a), CEBPA, CHEK2, CTNNA1, DICER1, DIS3L2, EGFR (c.2369C>T, p.Thr790Met variant only), EPCAM (Deletion/duplication testing only), FH, FLCN, GATA2, GPC3, GREM1 (Promoter region deletion/duplication testing only), HOXB13 (c.251G>A, p.Gly84Glu), HRAS, KIT, MAX, MEN1, MET, MITF (c.952G>A, p.Glu318Lys variant only), MLH1, MSH2, MSH3, MSH6, MUTYH, NBN, NF1, NF2, NTHL1, PALB2, PDGFRA, PHOX2B, PMS2, POLD1, POLE, POT1, PRKAR1A, PTCH1, PTEN, RAD50, RAD51C, RAD51D, RB1, RECQL4, RET, RUNX1, SDHAF2, SDHA (sequence changes only), SDHB, SDHC, SDHD, SMAD4, SMARCA4, SMARCB1, SMARCE1, STK11, SUFU, TERC, TERT, TMEM127, TP53, TSC1, TSC2, VHL, WRN and WT1.    Based on Jordan Martinez personal and family history of cancer, she meets medical criteria for genetic testing. Despite that she meets criteria, she may still have an out of pocket cost. We discussed that if her out of pocket cost for testing is over $100, the laboratory will call and confirm whether she wants to proceed with testing.  If the out of pocket cost of testing is less than $100 she will be billed by the genetic testing laboratory.   PLAN: After considering the risks, benefits, and limitations, Jordan Martinez  provided informed consent to pursue genetic testing and the blood sample was sent to [facility] for analysis of the Lynch syndrome and Multi-cancer panel. Results should be available within approximately 2-3 weeks' time, at which point they will be disclosed by telephone to Jordan Martinez, as will any additional recommendations warranted by these results. Jordan Martinez will receive a summary of  her genetic counseling visit and a copy of her results once available. This information will also be available in Epic. We encouraged Jordan Martinez to remain in contact with cancer genetics annually so that we can continuously update the family history and inform her of any changes in cancer genetics and testing that may be of benefit for her family. Jordan Martinez questions were answered to her satisfaction today. Our contact information was provided should additional questions or concerns arise.  Lastly, we encouraged Jordan Martinez to remain in contact with cancer genetics annually so that we can continuously update the family history and inform her of any changes in cancer genetics and testing that may be of benefit for this family.   Ms.  Martinez questions were answered to her satisfaction today. Our contact information was provided should additional questions or concerns arise. Thank you for the referral and allowing [place] to share in the care of your patient.   Karen P. Florene Glen, [place], [facility] Certified Genetic Counselor [name] phone: [phone]  The patient was seen for a total of 45 minutes in face-to-face genetic counseling.  This patient was discussed with Drs. Magrinat, Lindi Adie and/or Burr Medico who agrees with the above.    _______________________________________________________________________ For Office Staff:  Number of people involved in session: 2 Was an Intern/ student involved with case: no

## 2018-05-19 LAB — CA 125: CANCER ANTIGEN (CA) 125: 77.5 U/mL — AB (ref 0.0–38.1)

## 2018-05-20 ENCOUNTER — Telehealth: Payer: Self-pay

## 2018-05-20 ENCOUNTER — Inpatient Hospital Stay (HOSPITAL_BASED_OUTPATIENT_CLINIC_OR_DEPARTMENT_OTHER): Payer: Medicare Other | Admitting: Gynecologic Oncology

## 2018-05-20 ENCOUNTER — Inpatient Hospital Stay: Payer: Medicare Other

## 2018-05-20 ENCOUNTER — Encounter: Payer: Self-pay | Admitting: Gynecologic Oncology

## 2018-05-20 VITALS — BP 146/97 | HR 68 | Temp 97.9°F | Resp 20 | Ht 64.0 in | Wt 198.0 lb

## 2018-05-20 DIAGNOSIS — Z7189 Other specified counseling: Secondary | ICD-10-CM

## 2018-05-20 DIAGNOSIS — C541 Malignant neoplasm of endometrium: Secondary | ICD-10-CM

## 2018-05-20 DIAGNOSIS — R3989 Other symptoms and signs involving the genitourinary system: Secondary | ICD-10-CM

## 2018-05-20 DIAGNOSIS — Z90710 Acquired absence of both cervix and uterus: Secondary | ICD-10-CM

## 2018-05-20 DIAGNOSIS — N3000 Acute cystitis without hematuria: Secondary | ICD-10-CM

## 2018-05-20 DIAGNOSIS — Z90722 Acquired absence of ovaries, bilateral: Secondary | ICD-10-CM

## 2018-05-20 LAB — URINALYSIS, COMPLETE (UACMP) WITH MICROSCOPIC
Bilirubin Urine: NEGATIVE
Glucose, UA: NEGATIVE mg/dL
HGB URINE DIPSTICK: NEGATIVE
Ketones, ur: NEGATIVE mg/dL
Nitrite: NEGATIVE
PROTEIN: NEGATIVE mg/dL
SPECIFIC GRAVITY, URINE: 1.016 (ref 1.005–1.030)
pH: 5 (ref 5.0–8.0)

## 2018-05-20 NOTE — Patient Instructions (Signed)
Please follow-up with Dr Alvy Bimler as planned for chemotherapy discussion on 06/10/18.  Dr Denman George will see you back after Dr Alvy Bimler has completed therapy (this will be arranged by Elmo Putt and Dr Alvy Bimler).  Continue to avoid heavy lifting until you are 4 weeks postop. No tub baths for 4 weeks postop. No intercourse for 8 weeks postop.
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
## 2018-05-20 NOTE — Telephone Encounter (Signed)
Told Ms Kronk that the urinalysis shows a small amount of white blood cells and Dr. Denman George recomends waiting for urine culture to result.  The office will call you next will with the culture results. Pt encouraged to force fluids to help clear bacteria in urine. Pt verbalized understanding.

## 2018-05-21 LAB — URINE CULTURE

## 2018-05-23 ENCOUNTER — Other Ambulatory Visit: Payer: Self-pay | Admitting: Gynecologic Oncology

## 2018-05-23 ENCOUNTER — Telehealth: Payer: Self-pay

## 2018-05-23 DIAGNOSIS — N390 Urinary tract infection, site not specified: Principal | ICD-10-CM

## 2018-05-23 DIAGNOSIS — A499 Bacterial infection, unspecified: Secondary | ICD-10-CM

## 2018-05-23 MED ORDER — CLINDAMYCIN HCL 150 MG PO CAPS: 150.0000 mg | ORAL_CAPSULE | Freq: Three times a day (TID) | ORAL | 0 refills | Status: DC

## 2018-05-23 NOTE — Telephone Encounter (Signed)
Outgoing call to pt per [name] NP- per her, antibiotic Clindamycin has been called in to her pharmacy for three times a day for 7 days, monitor for diarrhea, can take probiotic or Activa as probiotic may help.  Pt voiced understanding. No other needs per pt at this time.

## 2018-05-23 NOTE — Telephone Encounter (Signed)
Received call back from pt regarding she has to take amoxicillin for her dental procedure also- per dr Denman George - pt can take her amoxicillin tomorrow and start clindamycin the following day- pt reports she has already called her pharmacy and had their recommendations on how to begin clindamycin- gave call over to [name] NP and she verbalized to pt that she is ok for her to follow her pharmacist's recommendations. No other needs per pt at this time.

## 2018-05-23 NOTE — Progress Notes (Signed)
Patient symptomatic with dysuria.  Urine culture showing 60,000 colonies of lactobacillus species.  No sensitivities provided.  Spoke with patient's pharmacy and the pharmacist recommended trying clindamycin.  Per Dr. Denman George, plan to order clindamycin 150 mg three daily for 7 days.

## 2018-05-25 ENCOUNTER — Telehealth: Payer: Self-pay | Admitting: Oncology

## 2018-05-25 NOTE — Telephone Encounter (Signed)
Jordan Martinez called and said she needs to have a bone scan to evaluate her knee replacements.  She was given the option of [facility] or [facility] and was wondering which would be better.  Advised her that I will talk with the [facility] Nurse Navigator and call her back.  She also had questions regarding a porta cath and whether she can travel to [place] for Christmas.  Educated her about the porta cath and advised her to discuss traveling during chemotherapy with Dr. Alvy Bimler at her appointment on 06/10/2018.

## 2018-05-27 DIAGNOSIS — M25561 Pain in right knee: Secondary | ICD-10-CM | POA: Insufficient documentation

## 2018-05-30 ENCOUNTER — Encounter: Payer: Self-pay | Admitting: Oncology

## 2018-05-30 ENCOUNTER — Telehealth: Payer: Self-pay | Admitting: Genetic Counselor

## 2018-05-30 ENCOUNTER — Encounter: Payer: Self-pay | Admitting: Genetic Counselor

## 2018-05-30 DIAGNOSIS — Z1379 Encounter for other screening for genetic and chromosomal anomalies: Secondary | ICD-10-CM | POA: Insufficient documentation

## 2018-05-30 NOTE — Telephone Encounter (Signed)
LM on VM that results are back and to please call.  Left CB instructions. 

## 2018-05-30 NOTE — Progress Notes (Signed)
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 Disposition is to chemotherapy.   This Multidisciplinary conference took place involving physicians from [place], [place], Radiation Oncology, Pathology, Radiology along with the Gynecologic Oncology Nurse Practitioner and RN.  Comprehensive assessment of the patient's malignancy, staging, need for surgery, chemotherapy, radiation therapy, and need for further testing were reviewed. Supportive measures, both inpatient and following discharge were also discussed. The recommended plan of care is documented. Greater than 35 minutes were spent correlating and coordinating this patient's care.

## 2018-05-31 NOTE — Telephone Encounter (Signed)
LM on VM that results are back.  Asked that she please CB.  Left phone number.

## 2018-05-31 NOTE — Telephone Encounter (Signed)
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  This confirms a dx of Lynch syndrome.  Discussed that this was what we were expecting based on her daughter's diagnosis and testing, as well has her tumor testing.  We will see her back in clinic the week of 06/13/2018.

## 2018-06-09 ENCOUNTER — Encounter: Payer: Self-pay | Admitting: Hematology and Oncology

## 2018-06-09 DIAGNOSIS — E119 Type 2 diabetes mellitus without complications: Secondary | ICD-10-CM | POA: Insufficient documentation

## 2018-06-10 ENCOUNTER — Telehealth: Payer: Self-pay | Admitting: Oncology

## 2018-06-10 ENCOUNTER — Telehealth: Payer: Self-pay | Admitting: Hematology and Oncology

## 2018-06-10 ENCOUNTER — Encounter: Payer: Self-pay | Admitting: Oncology

## 2018-06-10 ENCOUNTER — Inpatient Hospital Stay: Payer: Medicare Other

## 2018-06-10 ENCOUNTER — Encounter: Payer: Self-pay | Admitting: Hematology and Oncology

## 2018-06-10 ENCOUNTER — Inpatient Hospital Stay: Payer: Medicare Other | Attending: Obstetrics | Admitting: Hematology and Oncology

## 2018-06-10 VITALS — BP 117/85 | HR 58 | Temp 98.5°F | Resp 17 | Ht 64.0 in | Wt 200.2 lb

## 2018-06-10 DIAGNOSIS — M25562 Pain in left knee: Secondary | ICD-10-CM | POA: Diagnosis not present

## 2018-06-10 DIAGNOSIS — N3000 Acute cystitis without hematuria: Secondary | ICD-10-CM

## 2018-06-10 DIAGNOSIS — C541 Malignant neoplasm of endometrium: Secondary | ICD-10-CM

## 2018-06-10 DIAGNOSIS — I10 Essential (primary) hypertension: Secondary | ICD-10-CM | POA: Insufficient documentation

## 2018-06-10 DIAGNOSIS — G8929 Other chronic pain: Secondary | ICD-10-CM | POA: Insufficient documentation

## 2018-06-10 DIAGNOSIS — R531 Weakness: Secondary | ICD-10-CM | POA: Insufficient documentation

## 2018-06-10 DIAGNOSIS — Z79899 Other long term (current) drug therapy: Secondary | ICD-10-CM | POA: Diagnosis not present

## 2018-06-10 DIAGNOSIS — Z1509 Genetic susceptibility to other malignant neoplasm: Secondary | ICD-10-CM | POA: Insufficient documentation

## 2018-06-10 DIAGNOSIS — R634 Abnormal weight loss: Secondary | ICD-10-CM | POA: Diagnosis not present

## 2018-06-10 DIAGNOSIS — M25561 Pain in right knee: Secondary | ICD-10-CM | POA: Insufficient documentation

## 2018-06-10 DIAGNOSIS — Z800 Family history of malignant neoplasm of digestive organs: Secondary | ICD-10-CM | POA: Diagnosis not present

## 2018-06-10 DIAGNOSIS — Z5111 Encounter for antineoplastic chemotherapy: Secondary | ICD-10-CM | POA: Insufficient documentation

## 2018-06-10 DIAGNOSIS — E119 Type 2 diabetes mellitus without complications: Secondary | ICD-10-CM

## 2018-06-10 DIAGNOSIS — N179 Acute kidney failure, unspecified: Secondary | ICD-10-CM | POA: Insufficient documentation

## 2018-06-10 DIAGNOSIS — F17210 Nicotine dependence, cigarettes, uncomplicated: Secondary | ICD-10-CM | POA: Insufficient documentation

## 2018-06-10 DIAGNOSIS — K219 Gastro-esophageal reflux disease without esophagitis: Secondary | ICD-10-CM | POA: Diagnosis not present

## 2018-06-10 DIAGNOSIS — Z87440 Personal history of urinary (tract) infections: Secondary | ICD-10-CM | POA: Insufficient documentation

## 2018-06-10 DIAGNOSIS — R0789 Other chest pain: Secondary | ICD-10-CM | POA: Diagnosis not present

## 2018-06-10 DIAGNOSIS — Z7984 Long term (current) use of oral hypoglycemic drugs: Secondary | ICD-10-CM | POA: Insufficient documentation

## 2018-06-10 DIAGNOSIS — Z7982 Long term (current) use of aspirin: Secondary | ICD-10-CM | POA: Insufficient documentation

## 2018-06-10 LAB — URINALYSIS, COMPLETE (UACMP) WITH MICROSCOPIC
BACTERIA UA: NONE SEEN
Bilirubin Urine: NEGATIVE
GLUCOSE, UA: NEGATIVE mg/dL
HGB URINE DIPSTICK: NEGATIVE
Ketones, ur: NEGATIVE mg/dL
LEUKOCYTES UA: NEGATIVE
NITRITE: NEGATIVE
PH: 5 (ref 5.0–8.0)
Protein, ur: NEGATIVE mg/dL
SPECIFIC GRAVITY, URINE: 1.02 (ref 1.005–1.030)

## 2018-06-10 LAB — CBC WITH DIFFERENTIAL/PLATELET
Abs Immature Granulocytes: 0.03 10*3/uL (ref 0.00–0.07)
BASOS ABS: 0.1 10*3/uL (ref 0.0–0.1)
BASOS PCT: 1 %
EOS ABS: 0.3 10*3/uL (ref 0.0–0.5)
EOS PCT: 4 %
HCT: 44.9 % (ref 36.0–46.0)
Hemoglobin: 15 g/dL (ref 12.0–15.0)
Immature Granulocytes: 0 %
Lymphocytes Relative: 36 %
Lymphs Abs: 3.2 10*3/uL (ref 0.7–4.0)
MCH: 32.5 pg (ref 26.0–34.0)
MCHC: 33.4 g/dL (ref 30.0–36.0)
MCV: 97.2 fL (ref 80.0–100.0)
MONO ABS: 0.7 10*3/uL (ref 0.1–1.0)
Monocytes Relative: 8 %
NRBC: 0 % (ref 0.0–0.2)
Neutro Abs: 4.5 10*3/uL (ref 1.7–7.7)
Neutrophils Relative %: 51 %
PLATELETS: 264 10*3/uL (ref 150–400)
RBC: 4.62 MIL/uL (ref 3.87–5.11)
RDW: 13.5 % (ref 11.5–15.5)
WBC: 8.8 10*3/uL (ref 4.0–10.5)

## 2018-06-10 LAB — COMPREHENSIVE METABOLIC PANEL
ALK PHOS: 129 U/L — AB (ref 38–126)
ALT: 36 U/L (ref 0–44)
ANION GAP: 14 (ref 5–15)
AST: 28 U/L (ref 15–41)
Albumin: 4 g/dL (ref 3.5–5.0)
BUN: 20 mg/dL (ref 8–23)
CO2: 23 mmol/L (ref 22–32)
Calcium: 10 mg/dL (ref 8.9–10.3)
Chloride: 106 mmol/L (ref 98–111)
Creatinine, Ser: 1.07 mg/dL — ABNORMAL HIGH (ref 0.44–1.00)
GFR, EST NON AFRICAN AMERICAN: 54 mL/min — AB (ref >60)
Glucose, Bld: 131 mg/dL — ABNORMAL HIGH (ref 70–99)
Potassium: 4.1 mmol/L (ref 3.5–5.1)
SODIUM: 143 mmol/L (ref 135–145)
Total Bilirubin: 0.4 mg/dL (ref 0.3–1.2)
Total Protein: 7.4 g/dL (ref 6.5–8.1)

## 2018-06-10 NOTE — Assessment & Plan Note (Signed)
I will discuss dietary modification prior to treatment I plan to reduce oral dexamethasone due to anticipation severe risk of hyperglycemia

## 2018-06-10 NOTE — Progress Notes (Signed)
[place] NOTE  Patient Care Team: Javier Glazier, MD as PCP - General (Internal Medicine)  ASSESSMENT & PLAN:  Endometrial cancer [facility]) We discussed the role of adjuvant treatment She will receive combination treatment with carboplatin and Taxol I recommend port placement, chemo education class and chemotherapy consent next week We will draw her blood today The patient is interested for hair preservation with Dignicap and I provided some information for her to start her enrollment in anticipation for chemotherapy to start on June 20, 2018  Diabetes mellitus without complication ([place]) I will discuss dietary modification prior to treatment I plan to reduce oral dexamethasone due to anticipation severe risk of hyperglycemia  Urinary tract infection She had recent recurrent UTI I will check urinalysis and urine culture   Orders Placed This Encounter  Procedures  . Urine Culture    Standing Status:   Future    Number of Occurrences:   1    Standing Expiration Date:   07/15/2019  . IR IMAGING GUIDED PORT INSERTION    Standing Status:   Future    Standing Expiration Date:   08/12/2019    Order Specific Question:   Reason for Exam (SYMPTOM  OR DIAGNOSIS REQUIRED)    Answer:   need port for chemo to start 1016    Order Specific Question:   Preferred Imaging Location?    Answer:   [facility]  . CBC with Differential/Platelet    Standing Status:   Future    Number of Occurrences:   1    Standing Expiration Date:   07/15/2019  . Comprehensive metabolic panel    Standing Status:   Future    Number of Occurrences:   1    Standing Expiration Date:   07/15/2019  . Urinalysis, Complete w Microscopic    Standing Status:   Future    Number of Occurrences:   1    Standing Expiration Date:   07/15/2019     CHIEF COMPLAINTS/PURPOSE OF CONSULTATION:  Recent diagnosis of endometrial cancer with mixed pathology and positive cytology from peritoneal washing,  for adjuvant chemotherapy  HISTORY OF PRESENTING ILLNESS:  Jordan Martinez 67 y.o. female is here because of recent diagnosis of endometrial cancer.  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 Endometrium, biopsy - HIGH GRADE CARCINOMA, SEE COMMENT. 2. Cervix, biopsy - UNREMARKABLE ENDOCERVICAL GLANDULAR AND SQUAMOUS MUCOSA. - MULTIPLE DETACHED FRAGMENTS OF HIGH GRADE CARCINOMA, MORPHOLOGICALLY SIMILAR TO SPECIMEN 1. Microscopic Comment 1. 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    04/21/2018 Imaging    US pelvis Abnormally thickened endometrium measuring up to 16 mm. In the setting of post-menopausal bleeding, endometrial sampling is indicated to exclude carcinoma. If results are benign, sonohysterogram should be considered for focal lesion work-up.     04/27/2018 Imaging  Ct abdomen and pelvis 1. Thickening of the endometrium identified compatible with endometrial carcinoma. 2. No findings to suggest metastatic adenopathy or distant metastatic disease. 3. Hiatal hernia 4.  Aortic Atherosclerosis (ICD10-I70.0).     05/02/2018 Tumor Marker    Patient's tumor was tested for the following markers: CA-125 Results of the tumor marker test revealed 65.3    05/03/2018  Pathology Results    1. Lymph node, sentinel, biopsy, right obturator - ONE OF ONE LYMPH NODES NEGATIVE FOR CARCINOMA (0/1). 2. Lymph node, sentinel, biopsy, left external iliac - FOUR OF FOUR LYMPH NODES NEGATIVE FOR CARCINOMA (0/4). 3. 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 TUMOR INVADES LESS THAN ONE HALF OF THE MYOMETRIUM. SEE ONCOLOGY TABLE. -SEROSA: UNREMARKABLE. NO MALIGNANCY. - CERVIX: BENIGN SQUAMOUS AND ENDOCERVICAL MUCOSA. NO DYSPLASIA OR MALIGNANCY. - BILATERAL OVARIES: INCLUSION CYSTS. NO MALIGNANCY. - BILATERAL FALLOPIAN TUBES: UNREMARKABLE. NO MALIGNANCY. Microscopic Comment 3. UTERUS, CARCINOMA OR CARCINOSARCOMA Procedure: Total hysterectomy with bilateral salpingo-oophorectomy. Right obturator and left external iliac lymph node biopsies. Histologic type: Endometrioid adenocarcinoma with squamous differentiation. Histologic Grade: FIGO grade I. Myometrial invasion: Depth of invasion: 3 mm Myometrial thickness: 12 mm Uterine Serosa Involvement: Not identified. Cervical stromal involvement: Not identified. Extent of involvement of other organs: Uninvolved. Lymphovascular invasion: Not identified. Regional Lymph Nodes: Examined: 5 Sentinel 0 Non-sentinel 5 Total Lymph nodes with metastasis: 0 Isolated tumor cells (< 0.2 mm): 0 Micrometastasis: (> 0.2 mm and < 2.0 mm): 0 Macrometastasis: (> 2.0 mm): 0 Extracapsular extension: N/A. MMR / MSI testing: Will be ordered. Pathologic Stage Classification (pTNM, AJCC 8th edition): pT1a, pN0 FIGO Stage: IA Representative Tumor Block: 3D-G Comment: Pancytokeratin was performed on the lymph nodes and is negative    05/03/2018 Surgery    Operation: Robotic-assisted laparoscopic total hysterectomy with bilateral salpingoophorectomy, SLN biopsy   Surgeon: Donaciano Eva  Operative Findings:  : 6cm normal appearing uterus. Normal tubes and ovaries. Normal omentum and diaphragm. No suspicious nodes.      05/03/2018 Pathology Results    PERITONEAL WASHING(SPECIMEN 1 OF 1 COLLECTED 05/03/18): MALIGNANT CELLS CONSISTENT WITH METASTATIC ADENOCARCINOMA.    05/18/2018 Tumor Marker    Patient's tumor was tested for the following markers: CA-125 Results of the tumor marker test revealed 77.5    05/27/2018 Genetic Testing    MSH6 c.2731C>T pathogenic variant and GATA2 c.1232C>T VUS identified on the multicancer panel.  The Multi-Gene Panel offered by [facility] includes sequencing and/or deletion duplication testing of the following 85 genes: AIP, ALK, APC, ATM, AXIN2,BAP1,  BARD1, BLM, BMPR1A, BRCA1, BRCA2, BRIP1, CASR, CDC73, CDH1, CDK4, CDKN1B, CDKN1C, CDKN2A (p14ARF), CDKN2A (p16INK4a), CEBPA, CHEK2, CTNNA1, DICER1, DIS3L2, EGFR (c.2369C>T, p.Thr790Met variant only), EPCAM (Deletion/duplication testing only), FH, FLCN, GATA2, GPC3, GREM1 (Promoter region deletion/duplication testing only), HOXB13 (c.251G>A, p.Gly84Glu), HRAS, KIT, MAX, MEN1, MET, MITF (c.952G>A, p.Glu318Lys variant only), MLH1, MSH2, MSH3, MSH6, MUTYH, NBN, NF1, NF2, NTHL1, PALB2, PDGFRA, PHOX2B, PMS2, POLD1, POLE, POT1, PRKAR1A, PTCH1, PTEN, RAD50, RAD51C, RAD51D, RB1, RECQL4, RET, RNF43, RUNX1, SDHAF2, SDHA (sequence changes only), SDHB, SDHC, SDHD, SMAD4, SMARCA4, SMARCB1, SMARCE1, STK11, SUFU, TERC, TERT, TMEM127, TP53, TSC1, TSC2, VHL, WRN and WT1.  The report date is 05/27/2018.    06/09/2018 Cancer Staging    Staging form: Corpus Uteri - Carcinoma and Carcinosarcoma, AJCC 8th Edition - Pathologic: Stage I (pT1, pN0, cM0) - Signed by Heath Lark, MD on 06/09/2018    Since  surgery, she had recurrent urinary tract infection, treated with 2 separate courses of antibiotic therapy Her symptoms has improved She denies recent fever or chills No recent nausea or vomiting Even though her  medical records revealed diagnosis of neuropathy, she complained of predominantly knee pain and changes sensations around her knee but denies diabetic associated neuropathy or complications She is currently on diet controlled diabetes  MEDICAL HISTORY:  Past Medical History:  Diagnosis Date  . Anxiety   . Arthritis   . Depression   . Diabetes mellitus without complication (HCC)    diet controlled. States always has borderline HgbA1C  . Endometrial cancer ([place])   . Family history of colon cancer   . Family history of genetic mutation for hereditary nonpolyposis colorectal cancer (HNPCC)   . GERD (gastroesophageal reflux disease)   . High cholesterol   . History of kidney stones    years   . Hypertension   . Neuropathy   . Sensation of pressure in bladder area   . Uterine cramping     SURGICAL HISTORY: Past Surgical History:  Procedure Laterality Date  . burnt the nerves  Right 04/29/2018   in right knee   . burnt the nerves  Left 04/22/2018   left knee nerve   . cystoscopy kidney stone   1985  . REPLACEMENT TOTAL KNEE BILATERAL Bilateral 2018   Right x 2. 2019 undergoing intermittent injection "RFA" procedures with Dr. Sheliah Mends [facility]  . REVISION TOTAL KNEE ARTHROPLASTY Right 03/2017  . ROBOTIC ASSISTED TOTAL HYSTERECTOMY WITH BILATERAL SALPINGO OOPHERECTOMY N/A 05/03/2018   Procedure: XI ROBOTIC ASSISTED TOTAL HYSTERECTOMY WITH BILATERAL SALPINGO OOPHORECTOMY;  Surgeon: Everitt Amber, MD;  Location: [facility]S;  Service: Gynecology;  Laterality: N/A;  . SENTINEL NODE BIOPSY N/A 05/03/2018   Procedure: SENTINEL NODE BIOPSY;  Surgeon: Everitt Amber, MD;  Location: [facility]S;  Service: Gynecology;  Laterality: N/A;  . TONSILLECTOMY AND ADENOIDECTOMY    . TUBAL LIGATION      SOCIAL HISTORY: Social History   Socioeconomic History  . Marital status: Divorced    Spouse name: Not on file  . Number of children: 2  . Years of education: Not on file  . Highest education level:  Not on file  Occupational History  . Occupation: retired Marine scientist  Social Needs  . Financial resource strain: Not on file  . Food insecurity:    Worry: Not on file    Inability: Not on file  . Transportation needs:    Medical: Not on file    Non-medical: Not on file  Tobacco Use  . Smoking status: Current Every Day Smoker    Packs/day: 0.50    Years: 40.00    Pack years: 20.00    Types: Cigarettes  . Smokeless tobacco: Never Used  . Tobacco comment: 4-5 loose cigarettes per day , over 20 year hx of smoking   Substance and Sexual Activity  . Alcohol use: Yes    Comment: 2 glasses wine daily.   . Drug use: Never  . Sexual activity: Not Currently    Birth control/protection: None  Lifestyle  . Physical activity:    Days per week: Not on file    Minutes per session: Not on file  . Stress: Not on file  Relationships  . Social connections:    Talks on phone: Not on file    Gets together: Not on file    Attends religious service: Not on file    Active member of  club or organization: Not on file    Attends meetings of clubs or organizations: Not on file    Relationship status: Not on file  . Intimate partner violence:    Fear of current or ex partner: Not on file    Emotionally abused: Not on file    Physically abused: Not on file    Forced sexual activity: Not on file  Other Topics Concern  . Not on file  Social History Narrative  . Not on file    FAMILY HISTORY: Family History  Adopted: Yes  Problem Relation Age of Onset  . Colon cancer Daughter 42       Lynch syndrome - MSH6 +  . Healthy Son     ALLERGIES:  is allergic to amlodipine; compazine [prochlorperazine edisylate]; gabapentin; lyrica [pregabalin]; and sertraline.  MEDICATIONS:  Current Outpatient Medications  Medication Sig Dispense Refill  . atenolol (TENORMIN) 100 MG tablet Take 100 mg by mouth at bedtime.   1  . atorvastatin (LIPITOR) 20 MG tablet Take 20 mg by mouth at bedtime.   0  . celecoxib  (CELEBREX) 200 MG capsule Take 200 mg by mouth 2 (two) times daily.     . cholecalciferol (VITAMIN D) 400 units TABS tablet Take 400 Units by mouth daily.    . cyclobenzaprine (FLEXERIL) 5 MG tablet Take 5 mg by mouth 3 (three) times daily.     Marland Kitchen docusate sodium (COLACE) 100 MG capsule Take 100 mg by mouth daily.     Marland Kitchen lisinopril-hydrochlorothiazide (PRINZIDE,ZESTORETIC) 10-12.5 MG tablet Take 1 tablet by mouth daily.  0  . Omega-3 Fatty Acids (FISH OIL) 1200 MG CAPS Take 1,200 mg by mouth daily.     Marland Kitchen omeprazole (PRILOSEC) 20 MG capsule Take 20 mg by mouth 2 (two) times daily.     Marland Kitchen Propylene Glycol (SYSTANE BALANCE) 0.6 % SOLN Place 1 drop into both eyes every morning.     No current facility-administered medications for this visit.     REVIEW OF SYSTEMS:   Constitutional: Denies fevers, chills or abnormal night sweats Eyes: Denies blurriness of vision, double vision or watery eyes Ears, nose, mouth, throat, and face: Denies mucositis or sore throat Respiratory: Denies cough, dyspnea or wheezes Cardiovascular: Denies palpitation, chest discomfort or lower extremity swelling Gastrointestinal:  Denies nausea, heartburn or change in bowel habits Skin: Denies abnormal skin rashes Lymphatics: Denies new lymphadenopathy or easy bruising Neurological:Denies numbness, tingling or new weaknesses Behavioral/Psych: Mood is stable, no new changes  All other systems were reviewed with the patient and are negative.  PHYSICAL EXAMINATION: ECOG PERFORMANCE STATUS: 1 - Symptomatic but completely ambulatory  Vitals:   06/10/18 1134  BP: 117/85  Pulse: (!) 58  Resp: 17  Temp: 98.5 F (36.9 C)  SpO2: 96%   Filed Weights   06/10/18 1134  Weight: 200 lb 3.2 oz (90.8 kg)    GENERAL:alert, no distress and comfortable SKIN: skin color, texture, turgor are normal, no rashes or significant lesions EYES: normal, conjunctiva are pink and non-injected, sclera clear OROPHARYNX:no exudate, no erythema  and lips, buccal mucosa, and tongue normal  NECK: supple, thyroid normal size, non-tender, without nodularity LYMPH:  no palpable lymphadenopathy in the cervical, axillary or inguinal LUNGS: clear to auscultation and percussion with normal breathing effort HEART: regular rate & rhythm and no murmurs and no lower extremity edema ABDOMEN:abdomen soft, non-tender and normal bowel sounds.  She has well-healed surgical scar. Musculoskeletal:no cyanosis of digits and no clubbing  PSYCH: alert &  oriented x 3 with fluent speech NEURO: no focal motor/sensory deficits  LABORATORY DATA:  I have reviewed the data as listed Lab Results  Component Value Date   WBC 8.8 06/10/2018   HGB 15.0 06/10/2018   HCT 44.9 06/10/2018   MCV 97.2 06/10/2018   PLT 264 06/10/2018   Recent Labs    05/02/18 1050 05/04/18 0511 06/10/18 1332  NA 141 137 143  K 4.5 4.0 4.1  CL 105 107 106  CO2 '26 25 23  '$ GLUCOSE 178* 206* 131*  BUN 24* 32* 20  CREATININE 0.74 1.03* 1.07*  CALCIUM 9.6 8.3* 10.0  GFRNONAA >60 55* 54*  GFRAA >60 >60 >60  PROT 7.6  --  7.4  ALBUMIN 4.1  --  4.0  AST 34  --  28  ALT 44  --  36  ALKPHOS 135*  --  129*  BILITOT 1.0  --  0.4   I spent 55 minutes counseling the patient face to face. The total time spent in the appointment was 60 minutes and more than 50% was on counseling.  All questions were answered. The patient knows to call the clinic with any problems, questions or concerns.  Heath Lark, MD 06/10/2018 3:05 PM

## 2018-06-10 NOTE — Telephone Encounter (Signed)
Jordan Martinez and notified her of schedule change: chemo edu at 8 am and apt with Dr. Alvy Bimler at 10:30 on 06/16/18.  Also notified her that the urinalysis from today was negative.  She verbalized understanding and agreement.

## 2018-06-10 NOTE — Telephone Encounter (Signed)
Gave patient avs and calendar.  Add-on required for 12/16 appt.  Override needed for system.

## 2018-06-10 NOTE — Telephone Encounter (Signed)
Entered in error

## 2018-06-10 NOTE — Progress Notes (Signed)
START ON PATHWAY REGIMEN - Uterine     A cycle is every 21 days:     Paclitaxel      Carboplatin   **Always confirm dose/schedule in your pharmacy ordering system**  Patient Characteristics: Papillary Serous and Clear Cell Histology, Newly Diagnosed, Resected Histology: Papillary Serous and Clear Cell Histology Therapeutic Status: Newly Diagnosed AJCC T Category: T1 AJCC N Category: N0 AJCC M Category: M0 AJCC 8 Stage Grouping: I Surgical Status: Resected Intent of Therapy: Curative Intent, Discussed with Patient

## 2018-06-10 NOTE — Assessment & Plan Note (Signed)
She had recent recurrent UTI I will check urinalysis and urine culture

## 2018-06-10 NOTE — Assessment & Plan Note (Signed)
We discussed the role of adjuvant treatment She will receive combination treatment with carboplatin and Taxol I recommend port placement, chemo education class and chemotherapy consent next week We will draw her blood today The patient is interested for hair preservation with Dignicap and I provided some information for her to start her enrollment in anticipation for chemotherapy to start on June 20, 2018

## 2018-06-12 LAB — URINE CULTURE

## 2018-06-13 ENCOUNTER — Other Ambulatory Visit: Payer: Self-pay

## 2018-06-13 ENCOUNTER — Other Ambulatory Visit: Payer: Self-pay | Admitting: Radiology

## 2018-06-13 ENCOUNTER — Telehealth: Payer: Self-pay

## 2018-06-13 ENCOUNTER — Telehealth: Payer: Self-pay | Admitting: Oncology

## 2018-06-13 MED ORDER — NITROFURANTOIN MONOHYD MACRO 100 MG PO CAPS: 100.0000 mg | ORAL_CAPSULE | Freq: Two times a day (BID) | ORAL | 0 refills | Status: AC

## 2018-06-13 NOTE — Telephone Encounter (Signed)
-----   Message from Heath Lark, MD sent at 06/13/2018  8:04 AM EST ----- Regarding: urine test Even though urine culture showed minimum growth, since she is having port placement, I recommend her to start antibiotics Please also call IR to proceed with port since she is being treated  Macrobid 100 mg BID x 5 days. Please call it in

## 2018-06-13 NOTE — Telephone Encounter (Signed)
Jordan Martinez called and said she is trying to order the dignicap and was told by D[employer] that she needs to order it on the Internet.  It can't be done by phone.  Asked [name] if she would be able to come to the [place] today to set it up.  She said she does not have a ride.  She is going to ask her friends if they have a computer and also check at the front desk at her living community.

## 2018-06-13 NOTE — Telephone Encounter (Signed)
Called with below message. She verbalized understanding. Rx sent to pharmacy.  Tiffany in IR notified of below message from Dr. Alvy Bimler.

## 2018-06-14 ENCOUNTER — Telehealth: Payer: Self-pay | Admitting: Hematology and Oncology

## 2018-06-14 ENCOUNTER — Telehealth: Payer: Self-pay | Admitting: Oncology

## 2018-06-14 NOTE — Telephone Encounter (Signed)
Called patient to let her know we successfully added her infusion appt on 12/16 @ 8 am.

## 2018-06-14 NOTE — Telephone Encounter (Signed)
Called Jordan Martinez to verify she was able to order her kit and cards from [facility] yesterday.  She said everything is set up.

## 2018-06-15 ENCOUNTER — Ambulatory Visit (HOSPITAL_COMMUNITY)
Admission: RE | Admit: 2018-06-15 | Discharge: 2018-06-15 | Disposition: A | Discharge status: Home / Self Care | Payer: Medicare Other | Source: Ambulatory Visit | Attending: Hematology and Oncology | Admitting: Hematology and Oncology

## 2018-06-15 ENCOUNTER — Ambulatory Visit: Payer: Medicare Other | Admitting: Hematology and Oncology

## 2018-06-15 ENCOUNTER — Other Ambulatory Visit: Payer: Medicare Other

## 2018-06-15 ENCOUNTER — Encounter: Payer: Medicare Other | Admitting: Genetic Counselor

## 2018-06-15 ENCOUNTER — Encounter (HOSPITAL_COMMUNITY): Payer: Self-pay

## 2018-06-15 DIAGNOSIS — E1140 Type 2 diabetes mellitus with diabetic neuropathy, unspecified: Secondary | ICD-10-CM | POA: Diagnosis not present

## 2018-06-15 DIAGNOSIS — Z87440 Personal history of urinary (tract) infections: Secondary | ICD-10-CM | POA: Insufficient documentation

## 2018-06-15 DIAGNOSIS — M1990 Unspecified osteoarthritis, unspecified site: Secondary | ICD-10-CM | POA: Diagnosis not present

## 2018-06-15 DIAGNOSIS — Z79899 Other long term (current) drug therapy: Secondary | ICD-10-CM | POA: Insufficient documentation

## 2018-06-15 DIAGNOSIS — Z90722 Acquired absence of ovaries, bilateral: Secondary | ICD-10-CM | POA: Diagnosis not present

## 2018-06-15 DIAGNOSIS — E785 Hyperlipidemia, unspecified: Secondary | ICD-10-CM | POA: Diagnosis not present

## 2018-06-15 DIAGNOSIS — C541 Malignant neoplasm of endometrium: Secondary | ICD-10-CM | POA: Insufficient documentation

## 2018-06-15 DIAGNOSIS — K219 Gastro-esophageal reflux disease without esophagitis: Secondary | ICD-10-CM | POA: Diagnosis not present

## 2018-06-15 DIAGNOSIS — F17210 Nicotine dependence, cigarettes, uncomplicated: Secondary | ICD-10-CM | POA: Insufficient documentation

## 2018-06-15 DIAGNOSIS — Z800 Family history of malignant neoplasm of digestive organs: Secondary | ICD-10-CM | POA: Insufficient documentation

## 2018-06-15 DIAGNOSIS — I10 Essential (primary) hypertension: Secondary | ICD-10-CM | POA: Insufficient documentation

## 2018-06-15 DIAGNOSIS — Z888 Allergy status to other drugs, medicaments and biological substances status: Secondary | ICD-10-CM | POA: Insufficient documentation

## 2018-06-15 DIAGNOSIS — Z90710 Acquired absence of both cervix and uterus: Secondary | ICD-10-CM | POA: Insufficient documentation

## 2018-06-15 HISTORY — PX: IR IMAGING GUIDED PORT INSERTION: IMG5740

## 2018-06-15 LAB — BASIC METABOLIC PANEL
ANION GAP: 11 (ref 5–15)
BUN: 20 mg/dL (ref 8–23)
CHLORIDE: 105 mmol/L (ref 98–111)
CO2: 25 mmol/L (ref 22–32)
Calcium: 9.5 mg/dL (ref 8.9–10.3)
Creatinine, Ser: 0.74 mg/dL (ref 0.44–1.00)
GFR calc Af Amer: >60 mL/min (ref >60)
GFR calc non Af Amer: >60 mL/min (ref >60)
Glucose, Bld: 161 mg/dL — ABNORMAL HIGH (ref 70–99)
Potassium: 4 mmol/L (ref 3.5–5.1)
Sodium: 141 mmol/L (ref 135–145)

## 2018-06-15 LAB — CBC WITH DIFFERENTIAL/PLATELET
Abs Immature Granulocytes: 0.02 10*3/uL (ref 0.00–0.07)
Basophils Absolute: 0.1 10*3/uL (ref 0.0–0.1)
Basophils Relative: 1 %
Eosinophils Absolute: 0.2 10*3/uL (ref 0.0–0.5)
Eosinophils Relative: 2 %
HEMATOCRIT: 46.8 % — AB (ref 36.0–46.0)
Hemoglobin: 15.2 g/dL — ABNORMAL HIGH (ref 12.0–15.0)
Immature Granulocytes: 0 %
LYMPHS ABS: 2.5 10*3/uL (ref 0.7–4.0)
Lymphocytes Relative: 31 %
MCH: 32.5 pg (ref 26.0–34.0)
MCHC: 32.5 g/dL (ref 30.0–36.0)
MCV: 100.2 fL — ABNORMAL HIGH (ref 80.0–100.0)
Monocytes Absolute: 0.6 10*3/uL (ref 0.1–1.0)
Monocytes Relative: 8 %
Neutro Abs: 4.7 10*3/uL (ref 1.7–7.7)
Neutrophils Relative %: 58 %
Platelets: 263 10*3/uL (ref 150–400)
RBC: 4.67 MIL/uL (ref 3.87–5.11)
RDW: 13.6 % (ref 11.5–15.5)
WBC: 8.1 10*3/uL (ref 4.0–10.5)
nRBC: 0 % (ref 0.0–0.2)

## 2018-06-15 LAB — PROTIME-INR
INR: 0.82
PROTHROMBIN TIME: 11.2 s — AB (ref 11.4–15.2)

## 2018-06-15 MED ORDER — HEPARIN SOD (PORK) LOCK FLUSH 100 UNIT/ML IV SOLN
INTRAVENOUS | Status: AC
  Filled 2018-06-15: qty 5

## 2018-06-15 MED ORDER — MIDAZOLAM HCL 2 MG/2ML IJ SOLN
INTRAMUSCULAR | Status: AC
  Filled 2018-06-15: qty 4

## 2018-06-15 MED ORDER — FENTANYL CITRATE (PF) 100 MCG/2ML IJ SOLN
INTRAMUSCULAR | Status: AC
  Filled 2018-06-15: qty 2

## 2018-06-15 MED ORDER — CEFAZOLIN SODIUM-DEXTROSE 2-4 GM/100ML-% IV SOLN
INTRAVENOUS | Status: AC
  Administered 2018-06-15: 2 g via INTRAVENOUS
  Filled 2018-06-15: qty 100

## 2018-06-15 MED ORDER — LIDOCAINE HCL (PF) 1 % IJ SOLN
INTRAMUSCULAR | Status: AC | PRN
  Administered 2018-06-15: 5 mL
  Administered 2018-06-15: 10 mL

## 2018-06-15 MED ORDER — FENTANYL CITRATE (PF) 100 MCG/2ML IJ SOLN
INTRAMUSCULAR | Status: AC | PRN
  Administered 2018-06-15 (×2): 50 ug via INTRAVENOUS

## 2018-06-15 MED ORDER — SODIUM CHLORIDE 0.9 % IV SOLN
INTRAVENOUS | Status: DC
  Administered 2018-06-15: 11:00:00 via INTRAVENOUS

## 2018-06-15 MED ORDER — MIDAZOLAM HCL 2 MG/2ML IJ SOLN
INTRAMUSCULAR | Status: AC | PRN
  Administered 2018-06-15 (×3): 1 mg via INTRAVENOUS

## 2018-06-15 MED ORDER — LIDOCAINE HCL 1 % IJ SOLN
INTRAMUSCULAR | Status: AC
  Filled 2018-06-15: qty 20

## 2018-06-15 MED ORDER — HEPARIN SOD (PORK) LOCK FLUSH 100 UNIT/ML IV SOLN
INTRAVENOUS | Status: AC | PRN
  Administered 2018-06-15: 500 [IU] via INTRAVENOUS

## 2018-06-15 MED ORDER — CEFAZOLIN SODIUM-DEXTROSE 2-4 GM/100ML-% IV SOLN
2.0000 g | INTRAVENOUS | Status: AC
  Administered 2018-06-15: 2 g via INTRAVENOUS

## 2018-06-15 NOTE — Discharge Instructions (Signed)
Moderate Conscious Sedation, Adult, Care After °These instructions provide you with information about caring for yourself after your procedure. Your health care provider may also give you more specific instructions. Your treatment has been planned according to current medical practices, but problems sometimes occur. Call your health care provider if you have any problems or questions after your procedure. °What can I expect after the procedure? °After your procedure, it is common: °· To feel sleepy for several hours. °· To feel clumsy and have poor balance for several hours. °· To have poor judgment for several hours. °· To vomit if you eat too soon. ° °Follow these instructions at home: °For at least 24 hours after the procedure: ° °· Do not: °? Participate in activities where you could fall or become injured. °? Drive. °? Use heavy machinery. °? Drink alcohol. °? Take sleeping pills or medicines that cause drowsiness. °? Make important decisions or sign legal documents. °? Take care of children on your own. °· Rest. °Eating and drinking °· Follow the diet recommended by your health care provider. °· If you vomit: °? Drink water, juice, or soup when you can drink without vomiting. °? Make sure you have little or no nausea before eating solid foods. °General instructions °· Have a responsible adult stay with you until you are awake and alert. °· Take over-the-counter and prescription medicines only as told by your health care provider. °· If you smoke, do not smoke without supervision. °· Keep all follow-up visits as told by your health care provider. This is important. °Contact a health care provider if: °· You keep feeling nauseous or you keep vomiting. °· You feel light-headed. °· You develop a rash. °· You have a fever. °Get help right away if: °· You have trouble breathing. °This information is not intended to replace advice given to you by your health care provider. Make sure you discuss any questions you have  with your health care provider. °Document Released: 04/12/2013 Document Revised: 11/25/2015 Document Reviewed: 10/12/2015 °Elsevier Interactive Patient Education © 2018 Elsevier Inc[employer] ° ° °Implanted Port Insertion, Care After °This sheet gives you information about how to care for yourself after your procedure. Your health care provider may also give you more specific instructions. If you have problems or questions, contact your health care provider. °What can I expect after the procedure? °After your procedure, it is common to have: °· Discomfort at the port insertion site. °· Bruising on the skin over the port. This should improve over 3-4 days. ° °Follow these instructions at home: °Port care °· After your port is placed, you will get a manufacturer's information card. The card has information about your port. Keep this card with you at all times. °· Take care of the port as told by your health care provider. Ask your health care provider if you or a family member can get training for taking care of the port at home. A home health care nurse may also take care of the port. °· Make sure to remember what type of port you have. °Incision care °· Follow instructions from your health care provider about how to take care of your port insertion site. Make sure you: °? Wash your hands with soap and water before you change your bandage (dressing). If soap and water are not available, use hand sanitizer. °? Change your dressing as told by your health care provider.  You may remove your dressing tomorrow. °? Leave stitches (sutures), skin glue, or adhesive strips   in place. These skin closures may need to stay in place for 2 weeks or longer. If adhesive strip edges start to loosen and curl up, you may trim the loose edges. Do not remove adhesive strips completely unless your health care provider tells you to do that.  DO NOT USE EMLA cream for 2 weeks after port placement as this cream will remove surgical glue on your  incision.  Check your port insertion site every day for signs of infection. Check for: ? More redness, swelling, or pain. ? More fluid or blood. ? Warmth. ? Pus or a bad smell. General instructions  Do not take baths, swim, or use a hot tub until your health care provider approves.  You may shower tomorrow.  Do not lift anything that is heavier than 10 lb (4.5 kg) for a week, or as told by your health care provider.  Ask your health care provider when it is okay to: ? Return to work or school. ? Resume usual physical activities or sports.  Do not drive for 24 hours if you were given a medicine to help you relax (sedative).  Take over-the-counter and prescription medicines only as told by your health care provider.  Wear a medical alert bracelet in case of an emergency. This will tell any health care providers that you have a port.  Keep all follow-up visits as told by your health care provider. This is important. Contact a health care provider if:  You cannot flush your port with saline as directed, or you cannot draw blood from the port.  You have a fever or chills.  You have more redness, swelling, or pain around your port insertion site.  You have more fluid or blood coming from your port insertion site.  Your port insertion site feels warm to the touch.  You have pus or a bad smell coming from the port insertion site. Get help right away if:  You have chest pain or shortness of breath.  You have bleeding from your port that you cannot control. Summary  Take care of the port as told by your health care provider.  Change your dressing as told by your health care provider.  Keep all follow-up visits as told by your health care provider. This information is not intended to replace advice given to you by your health care provider. Make sure you discuss any questions you have with your health care provider. Document Released: 04/12/2013 Document Revised: 05/13/2016  Document Reviewed: 05/13/2016 Elsevier Interactive Patient Education  2017 [employer].

## 2018-06-15 NOTE — Procedures (Signed)
Interventional Radiology Procedure Note  Procedure: Single Lumen Power Port Placement    Access:  Right IJ vein.  Findings: Catheter tip positioned at SVC/RA junction. Port is ready for immediate use.   Complications: None  EBL: < 10 mL  Recommendations:  - Ok to shower in 24 hours - Do not submerge for 7 days - Routine line care   Sharie Amorin T. Louine Tenpenny, M.D Pager:  [phone]   
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
## 2018-06-16 ENCOUNTER — Encounter: Payer: Self-pay | Admitting: Hematology and Oncology

## 2018-06-16 ENCOUNTER — Inpatient Hospital Stay: Payer: Medicare Other

## 2018-06-16 ENCOUNTER — Encounter: Payer: Self-pay | Admitting: Oncology

## 2018-06-16 ENCOUNTER — Inpatient Hospital Stay (HOSPITAL_BASED_OUTPATIENT_CLINIC_OR_DEPARTMENT_OTHER): Payer: Medicare Other | Admitting: Hematology and Oncology

## 2018-06-16 ENCOUNTER — Telehealth: Payer: Self-pay | Admitting: Hematology and Oncology

## 2018-06-16 ENCOUNTER — Telehealth: Payer: Self-pay | Admitting: Oncology

## 2018-06-16 VITALS — BP 155/71 | HR 57 | Temp 97.7°F | Resp 18 | Ht 64.0 in | Wt 201.6 lb

## 2018-06-16 DIAGNOSIS — E119 Type 2 diabetes mellitus without complications: Secondary | ICD-10-CM | POA: Diagnosis not present

## 2018-06-16 DIAGNOSIS — C541 Malignant neoplasm of endometrium: Secondary | ICD-10-CM

## 2018-06-16 DIAGNOSIS — Z79899 Other long term (current) drug therapy: Secondary | ICD-10-CM

## 2018-06-16 DIAGNOSIS — R0789 Other chest pain: Secondary | ICD-10-CM | POA: Diagnosis not present

## 2018-06-16 DIAGNOSIS — N3000 Acute cystitis without hematuria: Secondary | ICD-10-CM

## 2018-06-16 DIAGNOSIS — G8918 Other acute postprocedural pain: Secondary | ICD-10-CM

## 2018-06-16 DIAGNOSIS — F17210 Nicotine dependence, cigarettes, uncomplicated: Secondary | ICD-10-CM

## 2018-06-16 DIAGNOSIS — K219 Gastro-esophageal reflux disease without esophagitis: Secondary | ICD-10-CM

## 2018-06-16 DIAGNOSIS — Z7982 Long term (current) use of aspirin: Secondary | ICD-10-CM

## 2018-06-16 MED ORDER — LIDOCAINE-PRILOCAINE 2.5-2.5 % EX CREA: TOPICAL_CREAM | CUTANEOUS | 3 refills | Status: DC

## 2018-06-16 MED ORDER — TRAMADOL HCL 50 MG PO TABS: 50.0000 mg | ORAL_TABLET | Freq: Four times a day (QID) | ORAL | 0 refills | Status: DC | PRN

## 2018-06-16 MED ORDER — ONDANSETRON HCL 8 MG PO TABS: 8.0000 mg | ORAL_TABLET | Freq: Three times a day (TID) | ORAL | 1 refills | Status: DC | PRN

## 2018-06-16 MED ORDER — DEXAMETHASONE 4 MG PO TABS: ORAL_TABLET | ORAL | 0 refills | Status: DC

## 2018-06-16 MED ORDER — PROMETHAZINE HCL 25 MG PO TABS: 25.0000 mg | ORAL_TABLET | Freq: Four times a day (QID) | ORAL | 0 refills | Status: DC | PRN

## 2018-06-16 NOTE — Assessment & Plan Note (Signed)
She has significant chest wall pain from recent port placement I recommend her to continue over-the-counter analgesics with Tylenol and to take tramadol as needed I warned her about risk of constipation with tramadol

## 2018-06-16 NOTE — Telephone Encounter (Signed)
Per 12/12 los, return for no new orders.

## 2018-06-16 NOTE — Telephone Encounter (Signed)
Jordan Martinez called and said the pharmacist at [facility] wanted her to call to see which glucometer is covered by her insurance.  Called [facility] and talked with the pharmacist who said One Touch Ultra is covered by her supplement.  Faxed in prescription per Dr. Alvy Bimler for the glucometer, test strips and lancets to [facility].

## 2018-06-16 NOTE — Assessment & Plan Note (Signed)
We reviewed the NCCN guidelines We discussed the role of chemotherapy. The intent is of curative intent.  We discussed some of the risks, benefits, side-effects of carboplatin & Taxol. Treatment is intravenous, every 3 weeks x 6 cycles  Some of the short term side-effects included, though not limited to, including weight loss, life threatening infections, risk of allergic reactions, need for transfusions of blood products, nausea, vomiting, change in bowel habits, loss of hair, admission to hospital for various reasons, and risks of death.   Long term side-effects are also discussed including risks of infertility, permanent damage to nerve function, hearing loss, chronic fatigue, kidney damage with possibility needing hemodialysis, and rare secondary malignancy including bone marrow disorders.  The patient is aware that the response rates discussed earlier is not guaranteed.  After a long discussion, patient made an informed decision to proceed with the prescribed plan of care.   Patient education material was dispensed. We discussed premedication with dexamethasone before chemotherapy. The patient would like to preserve her hair and she will use Dignicap

## 2018-06-16 NOTE — Assessment & Plan Note (Signed)
The patient is at risk of severe hyperglycemia while on steroid treatment. We discussed the importance of dietary modification and a plan to reduce a dose of oral premedication of dexamethasone I also recommend her to purchase insulin monitoring supplies while on treatment and if her blood sugar is over 250, she is instructed to call us for possible medical management of steroid-induced diabetes

## 2018-06-16 NOTE — Progress Notes (Signed)
[place] OFFICE PROGRESS NOTE  Patient Care Team: Javier Glazier, MD as PCP - General (Internal Medicine)  ASSESSMENT & PLAN:  Endometrial cancer [facility]) We reviewed the NCCN guidelines We discussed the role of chemotherapy. The intent is of curative intent.  We discussed some of the risks, benefits, side-effects of carboplatin & Taxol. Treatment is intravenous, every 3 weeks x 6 cycles  Some of the short term side-effects included, though not limited to, including weight loss, life threatening infections, risk of allergic reactions, need for transfusions of blood products, nausea, vomiting, change in bowel habits, loss of hair, admission to hospital for various reasons, and risks of death.   Long term side-effects are also discussed including risks of infertility, permanent damage to nerve function, hearing loss, chronic fatigue, kidney damage with possibility needing hemodialysis, and rare secondary malignancy including bone marrow disorders.  The patient is aware that the response rates discussed earlier is not guaranteed.  After a long discussion, patient made an informed decision to proceed with the prescribed plan of care.   Patient education material was dispensed. We discussed premedication with dexamethasone before chemotherapy. The patient would like to preserve her hair and she will use Dignicap  Diabetes mellitus without complication ([place]) The patient is at risk of severe hyperglycemia while on steroid treatment. We discussed the importance of dietary modification and a plan to reduce a dose of oral premedication of dexamethasone I also recommend her to purchase insulin monitoring supplies while on treatment and if her blood sugar is over 250, she is instructed to call us for possible medical management of steroid-induced diabetes  Urinary tract infection She had recently been treated with multiple courses of antibiotic therapy She will complete the course  of current treatment  Chest wall pain following surgery She has significant chest wall pain from recent port placement I recommend her to continue over-the-counter analgesics with Tylenol and to take tramadol as needed I warned her about risk of constipation with tramadol   No orders of the defined types were placed in this encounter.   INTERVAL HISTORY: Please see below for problem oriented charting. She returns for further follow-up and chemotherapy consent She feels well She continues to have some bladder discomfort After recent port placement, she complained of severe chest wall pain, not alleviated by extra strength Tylenol 1000 mg Her appetite is stable.  Denies abnormal vaginal bleeding.  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 Endometrium, biopsy - HIGH GRADE CARCINOMA, SEE COMMENT. 2. Cervix, biopsy - UNREMARKABLE ENDOCERVICAL GLANDULAR AND SQUAMOUS MUCOSA. - MULTIPLE DETACHED FRAGMENTS OF HIGH GRADE CARCINOMA, MORPHOLOGICALLY SIMILAR TO SPECIMEN 1. Microscopic Comment 1. 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    04/21/2018 Imaging    US pelvis Abnormally thickened endometrium measuring up to 16 mm. In the setting of post-menopausal bleeding, endometrial sampling is indicated to exclude carcinoma. If results are benign, sonohysterogram should be considered for focal lesion work-up.     04/27/2018 Imaging    Ct abdomen and pelvis 1. Thickening of the endometrium identified compatible with endometrial carcinoma. 2. No findings to suggest metastatic adenopathy or distant metastatic disease. 3. Hiatal hernia 4.  Aortic Atherosclerosis (ICD10-I70.0).  05/02/2018  Tumor Marker    Patient's tumor was tested for the following markers: CA-125 Results of the tumor marker test revealed 65.3    05/03/2018 Pathology Results    1. Lymph node, sentinel, biopsy, right obturator - ONE OF ONE LYMPH NODES NEGATIVE FOR CARCINOMA (0/1). 2. Lymph node, sentinel, biopsy, left external iliac - FOUR OF FOUR LYMPH NODES NEGATIVE FOR CARCINOMA (0/4). 3. 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 TUMOR INVADES LESS THAN ONE HALF OF THE MYOMETRIUM. SEE ONCOLOGY TABLE. -SEROSA: UNREMARKABLE. NO MALIGNANCY. - CERVIX: BENIGN SQUAMOUS AND ENDOCERVICAL MUCOSA. NO DYSPLASIA OR MALIGNANCY. - BILATERAL OVARIES: INCLUSION CYSTS. NO MALIGNANCY. - BILATERAL FALLOPIAN TUBES: UNREMARKABLE. NO MALIGNANCY. Microscopic Comment 3. UTERUS, CARCINOMA OR CARCINOSARCOMA Procedure: Total hysterectomy with bilateral salpingo-oophorectomy. Right obturator and left external iliac lymph node biopsies. Histologic type: Endometrioid adenocarcinoma with squamous differentiation. Histologic Grade: FIGO grade I. Myometrial invasion: Depth of invasion: 3 mm Myometrial thickness: 12 mm Uterine Serosa Involvement: Not identified. Cervical stromal involvement: Not identified. Extent of involvement of other organs: Uninvolved. Lymphovascular invasion: Not identified. Regional Lymph Nodes: Examined: 5 Sentinel 0 Non-sentinel 5 Total Lymph nodes with metastasis: 0 Isolated tumor cells (< 0.2 mm): 0 Micrometastasis: (> 0.2 mm and < 2.0 mm): 0 Macrometastasis: (> 2.0 mm): 0 Extracapsular extension: N/A. MMR / MSI testing: Will be ordered. Pathologic Stage Classification (pTNM, AJCC 8th edition): pT1a, pN0 FIGO Stage: IA Representative Tumor Block: 3D-G Comment: Pancytokeratin was performed on the lymph nodes and is negative     05/03/2018 Surgery    Operation: Robotic-assisted laparoscopic total hysterectomy with bilateral salpingoophorectomy, SLN biopsy   Surgeon: Donaciano Eva  Operative Findings:  : 6cm normal appearing uterus. Normal tubes and ovaries. Normal omentum and diaphragm. No suspicious nodes.      05/03/2018 Pathology Results    PERITONEAL WASHING(SPECIMEN 1 OF 1 COLLECTED 05/03/18): MALIGNANT CELLS CONSISTENT WITH METASTATIC ADENOCARCINOMA.    05/18/2018 Tumor Marker    Patient's tumor was tested for the following markers: CA-125 Results of the tumor marker test revealed 77.5    05/27/2018 Genetic Testing    MSH6 c.2731C>T pathogenic variant and GATA2 c.1232C>T VUS identified on the multicancer panel.  The Multi-Gene Panel offered by [facility] includes sequencing and/or deletion duplication testing of the following 85 genes: AIP, ALK, APC, ATM, AXIN2,BAP1,  BARD1, BLM, BMPR1A, BRCA1, BRCA2, BRIP1, CASR, CDC73, CDH1, CDK4, CDKN1B, CDKN1C, CDKN2A (p14ARF), CDKN2A (p16INK4a), CEBPA, CHEK2, CTNNA1, DICER1, DIS3L2, EGFR (c.2369C>T, p.Thr790Met variant only), EPCAM (Deletion/duplication testing only), FH, FLCN, GATA2, GPC3, GREM1 (Promoter region deletion/duplication testing only), HOXB13 (c.251G>A, p.Gly84Glu), HRAS, KIT, MAX, MEN1, MET, MITF (c.952G>A, p.Glu318Lys variant only), MLH1, MSH2, MSH3, MSH6, MUTYH, NBN, NF1, NF2, NTHL1, PALB2, PDGFRA, PHOX2B, PMS2, POLD1, POLE, POT1, PRKAR1A, PTCH1, PTEN, RAD50, RAD51C, RAD51D, RB1, RECQL4, RET, RNF43, RUNX1, SDHAF2, SDHA (sequence changes only), SDHB, SDHC, SDHD, SMAD4, SMARCA4, SMARCB1, SMARCE1, STK11, SUFU, TERC, TERT, TMEM127, TP53, TSC1, TSC2, VHL, WRN and WT1.  The report date is 05/27/2018.    06/09/2018 Cancer Staging    Staging form: Corpus Uteri - Carcinoma and Carcinosarcoma, AJCC 8th Edition - Pathologic: Stage I (pT1, pN0, cM0) - Signed by Heath Lark, MD on 06/09/2018    06/15/2018 Procedure    Placement of single lumen port a cath via  right internal jugular vein. The catheter tip lies at the cavo-atrial junction. A power injectable port a cath was placed and is ready for  immediate use.      REVIEW OF SYSTEMS:   Constitutional: Denies fevers, chills or abnormal weight loss Eyes: Denies blurriness of vision Ears, nose, mouth, throat, and face: Denies mucositis or sore throat Respiratory: Denies cough, dyspnea or wheezes Cardiovascular: Denies palpitation, chest discomfort or lower extremity swelling Gastrointestinal:  Denies nausea, heartburn or change in bowel habits Skin: Denies abnormal skin rashes Lymphatics: Denies new lymphadenopathy or easy bruising Neurological:Denies numbness, tingling or new weaknesses Behavioral/Psych: Mood is stable, no new changes  All other systems were reviewed with the patient and are negative.  I have reviewed the past medical history, past surgical history, social history and family history with the patient and they are unchanged from previous note.  ALLERGIES:  is allergic to amlodipine; compazine [prochlorperazine edisylate]; gabapentin; lyrica [pregabalin]; and sertraline.  MEDICATIONS:  Current Outpatient Medications  Medication Sig Dispense Refill  . aspirin EC 81 MG tablet Take 81 mg by mouth daily.    Marland Kitchen atenolol (TENORMIN) 100 MG tablet Take 100 mg by mouth at bedtime.   1  . atorvastatin (LIPITOR) 20 MG tablet Take 20 mg by mouth at bedtime.   0  . celecoxib (CELEBREX) 200 MG capsule Take 200 mg by mouth 2 (two) times daily.     . cholecalciferol (VITAMIN D) 400 units TABS tablet Take 400 Units by mouth daily.    . cyclobenzaprine (FLEXERIL) 5 MG tablet Take 5 mg by mouth 3 (three) times daily.     Marland Kitchen dexamethasone (DECADRON) 4 MG tablet Take 3 tabs at the night before and 3 tab the morning of chemotherapy, every 3 weeks, by mouth 36 tablet 0  . docusate sodium (COLACE) 100 MG capsule Take 100 mg by mouth daily.     Marland Kitchen lidocaine-prilocaine (EMLA) cream Apply to affected area  once 30 g 3  . lisinopril-hydrochlorothiazide (PRINZIDE,ZESTORETIC) 10-12.5 MG tablet Take 1 tablet by mouth daily.  0  . nitrofurantoin, macrocrystal-monohydrate, (MACROBID) 100 MG capsule Take 1 capsule (100 mg total) by mouth 2 (two) times daily for 5 days. 10 capsule 0  . Omega-3 Fatty Acids (FISH OIL) 1200 MG CAPS Take 1,200 mg by mouth daily.     Marland Kitchen omeprazole (PRILOSEC) 20 MG capsule Take 20 mg by mouth 2 (two) times daily.     . ondansetron (ZOFRAN) 8 MG tablet Take 1 tablet (8 mg total) by mouth every 8 (eight) hours as needed for refractory nausea / vomiting. Start on day 3 after chemo. 30 tablet 1  . promethazine (PHENERGAN) 25 MG tablet Take 1 tablet (25 mg total) by mouth every 6 (six) hours as needed for nausea or vomiting. 30 tablet 0  . Propylene Glycol (SYSTANE BALANCE) 0.6 % SOLN Place 1 drop into both eyes every morning.    . traMADol (ULTRAM) 50 MG tablet Take 1 tablet (50 mg total) by mouth every 6 (six) hours as needed. 30 tablet 0   No current facility-administered medications for this visit.     PHYSICAL EXAMINATION: ECOG PERFORMANCE STATUS: 1 - Symptomatic but completely ambulatory  Vitals:   06/16/18 1038  BP: (!) 155/71  Pulse: (!) 57  Resp: 18  Temp: 97.7 F (36.5 C)  SpO2: 100%   Filed Weights   06/16/18 1038  Weight: 201 lb 9.6 oz (91.4 kg)    GENERAL:alert, no distress and comfortable SKIN: Noted significant chest wall bruising EYES: normal, Conjunctiva are pink and non-injected, sclera clear NEURO: alert & oriented x 3 with fluent speech, no focal motor/sensory  deficits  LABORATORY DATA:  I have reviewed the data as listed    Component Value Date/Time   NA 141 06/15/2018 1057   K 4.0 06/15/2018 1057   CL 105 06/15/2018 1057   CO2 25 06/15/2018 1057   GLUCOSE 161 (H) 06/15/2018 1057   BUN 20 06/15/2018 1057   CREATININE 0.74 06/15/2018 1057   CALCIUM 9.5 06/15/2018 1057   PROT 7.4 06/10/2018 1332   ALBUMIN 4.0 06/10/2018 1332   AST 28  06/10/2018 1332   ALT 36 06/10/2018 1332   ALKPHOS 129 (H) 06/10/2018 1332   BILITOT 0.4 06/10/2018 1332   GFRNONAA >60 06/15/2018 1057   GFRAA >60 06/15/2018 1057    No results found for: SPEP, UPEP  Lab Results  Component Value Date   WBC 8.1 06/15/2018   NEUTROABS 4.7 06/15/2018   HGB 15.2 (H) 06/15/2018   HCT 46.8 (H) 06/15/2018   MCV 100.2 (H) 06/15/2018   PLT 263 06/15/2018      Chemistry      Component Value Date/Time   NA 141 06/15/2018 1057   K 4.0 06/15/2018 1057   CL 105 06/15/2018 1057   CO2 25 06/15/2018 1057   BUN 20 06/15/2018 1057   CREATININE 0.74 06/15/2018 1057      Component Value Date/Time   CALCIUM 9.5 06/15/2018 1057   ALKPHOS 129 (H) 06/10/2018 1332   AST 28 06/10/2018 1332   ALT 36 06/10/2018 1332   BILITOT 0.4 06/10/2018 1332       RADIOGRAPHIC STUDIES: I have personally reviewed the radiological images as listed and agreed with the findings in the report. Ir Imaging Guided Port Insertion  Result Date: 06/15/2018 CLINICAL DATA:  Endometrial carcinoma and need for porta cath for chemotherapy. EXAM: IMPLANTED PORT A CATH PLACEMENT WITH ULTRASOUND AND FLUOROSCOPIC GUIDANCE ANESTHESIA/SEDATION: 4.0 mg IV Versed; 100 mcg IV Fentanyl Total Moderate Sedation Time:  38 minutes The patient's level of consciousness and physiologic status were continuously monitored during the procedure by Radiology nursing. Additional Medications: 2 g IV Ancef. FLUOROSCOPY TIME:  1 minute and 24 seconds.  15.1 mGy. PROCEDURE: The procedure, risks, benefits, and alternatives were explained to the patient. Questions regarding the procedure were encouraged and answered. The patient understands and consents to the procedure. A time-out was performed prior to initiating the procedure. Ultrasound was utilized to confirm patency of the right internal jugular vein. The right neck and chest were prepped with chlorhexidine in a sterile fashion, and a sterile drape was applied  covering the operative field. Maximum barrier sterile technique with sterile gowns and gloves were used for the procedure. Local anesthesia was provided with 1% lidocaine. After creating a small venotomy incision, a 21 gauge needle was advanced into the right internal jugular vein under direct, real-time ultrasound guidance. Ultrasound image documentation was performed. After securing guidewire access, an 8 Fr dilator was placed. A J-wire was kinked to measure appropriate catheter length. A subcutaneous port pocket was then created along the upper chest wall utilizing sharp and blunt dissection. Portable cautery was utilized. The pocket was irrigated with sterile saline. A single lumen power injectable port was chosen for placement. The 8 Fr catheter was tunneled from the port pocket site to the venotomy incision. The port was placed in the pocket. External catheter was trimmed to appropriate length based on guidewire measurement. At the venotomy, an 8 Fr peel-away sheath was placed over a guidewire. The catheter was then placed through the sheath and the sheath removed. Final  catheter positioning was confirmed and documented with a fluoroscopic spot image. The port was accessed with a needle and aspirated and flushed with heparinized saline. The access needle was removed. The venotomy and port pocket incisions were closed with subcutaneous 3-0 Monocryl and subcuticular 4-0 Vicryl. Dermabond was applied to both incisions. COMPLICATIONS: COMPLICATIONS None FINDINGS: After catheter placement, the tip lies at the cavo-atrial junction. The catheter aspirates normally and is ready for immediate use. IMPRESSION: Placement of single lumen port a cath via right internal jugular vein. The catheter tip lies at the cavo-atrial junction. A power injectable port a cath was placed and is ready for immediate use. Electronically Signed   By: Aletta Edouard M.D.   On: 06/15/2018 15:53    All questions were answered. The patient  knows to call the clinic with any problems, questions or concerns. No barriers to learning was detected.  I spent 25 minutes counseling the patient face to face. The total time spent in the appointment was 30 minutes and more than 50% was on counseling and review of test results  Heath Lark, MD 06/16/2018 11:40 AM

## 2018-06-16 NOTE — Assessment & Plan Note (Signed)
She had recently been treated with multiple courses of antibiotic therapy She will complete the course of current treatment

## 2018-06-20 ENCOUNTER — Inpatient Hospital Stay: Payer: Medicare Other

## 2018-06-20 VITALS — BP 132/80 | HR 62 | Temp 99.2°F | Resp 18

## 2018-06-20 DIAGNOSIS — C541 Malignant neoplasm of endometrium: Secondary | ICD-10-CM

## 2018-06-20 MED ORDER — FAMOTIDINE IN NACL 20-0.9 MG/50ML-% IV SOLN
20.0000 mg | Freq: Once | INTRAVENOUS | Status: AC
  Administered 2018-06-20: 20 mg via INTRAVENOUS

## 2018-06-20 MED ORDER — PALONOSETRON HCL INJECTION 0.25 MG/5ML
0.2500 mg | Freq: Once | INTRAVENOUS | Status: AC
  Administered 2018-06-20: 0.25 mg via INTRAVENOUS

## 2018-06-20 MED ORDER — SODIUM CHLORIDE 0.9 % IV SOLN
620.0000 mg | Freq: Once | INTRAVENOUS | Status: AC
  Administered 2018-06-20: 620 mg via INTRAVENOUS
  Filled 2018-06-20: qty 62

## 2018-06-20 MED ORDER — DIPHENHYDRAMINE HCL 50 MG/ML IJ SOLN
50.0000 mg | Freq: Once | INTRAMUSCULAR | Status: AC
  Administered 2018-06-20: 50 mg via INTRAVENOUS

## 2018-06-20 MED ORDER — SODIUM CHLORIDE 0.9% FLUSH
10.0000 mL | INTRAVENOUS | Status: DC | PRN
  Filled 2018-06-20: qty 10

## 2018-06-20 MED ORDER — HEPARIN SOD (PORK) LOCK FLUSH 100 UNIT/ML IV SOLN
500.0000 [IU] | Freq: Once | INTRAVENOUS | Status: DC | PRN
  Filled 2018-06-20: qty 5

## 2018-06-20 MED ORDER — SODIUM CHLORIDE 0.9 % IV SOLN
Freq: Once | INTRAVENOUS | Status: AC
  Administered 2018-06-20: 09:00:00 via INTRAVENOUS
  Filled 2018-06-20: qty 250

## 2018-06-20 MED ORDER — SODIUM CHLORIDE 0.9 % IV SOLN
140.0000 mg/m2 | Freq: Once | INTRAVENOUS | Status: AC
  Administered 2018-06-20: 282 mg via INTRAVENOUS
  Filled 2018-06-20: qty 47

## 2018-06-20 MED ORDER — PALONOSETRON HCL INJECTION 0.25 MG/5ML
INTRAVENOUS | Status: AC
  Filled 2018-06-20: qty 5

## 2018-06-20 MED ORDER — SODIUM CHLORIDE 0.9 % IV SOLN
Freq: Once | INTRAVENOUS | Status: AC
  Administered 2018-06-20: 09:00:00 via INTRAVENOUS
  Filled 2018-06-20: qty 5

## 2018-06-20 MED ORDER — DIPHENHYDRAMINE HCL 50 MG/ML IJ SOLN
INTRAMUSCULAR | Status: AC
  Filled 2018-06-20: qty 1

## 2018-06-20 NOTE — Patient Instructions (Signed)
[facility] [place] Discharge Instructions for Patients Receiving Chemotherapy  Today you received the following chemotherapy agents Taxol and Carbolpatin  To help prevent nausea and vomiting after your treatment, we encourage you to take your nausea medication as prescribed.   If you develop nausea and vomiting that is not controlled by your nausea medication, call the clinic.   BELOW ARE SYMPTOMS THAT SHOULD BE REPORTED IMMEDIATELY:  *FEVER GREATER THAN 100.5 F  *CHILLS WITH OR WITHOUT FEVER  NAUSEA AND VOMITING THAT IS NOT CONTROLLED WITH YOUR NAUSEA MEDICATION  *UNUSUAL SHORTNESS OF BREATH  *UNUSUAL BRUISING OR BLEEDING  TENDERNESS IN MOUTH AND THROAT WITH OR WITHOUT PRESENCE OF ULCERS  *URINARY PROBLEMS  *BOWEL PROBLEMS  UNUSUAL RASH Items with * indicate a potential emergency and should be followed up as soon as possible.  Feel free to call the clinic should you have any questions or concerns. The clinic phone number is [phone].  Please show the [place] at check-in to the Emergency Department and triage nurse.  Paclitaxel injection (taxol) What is this medicine? PACLITAXEL (PAK li TAX el) is a chemotherapy drug. It targets fast dividing cells, like cancer cells, and causes these cells to die. This medicine is used to treat ovarian cancer, breast cancer, and other cancers. This medicine may be used for other purposes; ask your health care provider or pharmacist if you have questions. COMMON BRAND NAME(S): Onxol, Taxol What should I tell my health care provider before I take this medicine? They need to know if you have any of these conditions: -blood disorders -irregular heartbeat -infection (especially a virus infection such as chickenpox, cold sores, or herpes) -liver disease -previous or ongoing radiation therapy -an unusual or allergic reaction to paclitaxel, alcohol, polyoxyethylated castor oil, other chemotherapy agents, other medicines,  foods, dyes, or preservatives -pregnant or trying to get pregnant -breast-feeding How should I use this medicine? This drug is given as an infusion into a vein. It is administered in a hospital or clinic by a specially trained health care professional. Talk to your pediatrician regarding the use of this medicine in children. Special care may be needed. Overdosage: If you think you have taken too much of this medicine contact a poison control center or emergency room at once. NOTE: This medicine is only for you. Do not share this medicine with others. What if I miss a dose? It is important not to miss your dose. Call your doctor or health care professional if you are unable to keep an appointment. What may interact with this medicine? Do not take this medicine with any of the following medications: -disulfiram -metronidazole This medicine may also interact with the following medications: -cyclosporine -diazepam -ketoconazole -medicines to increase blood counts like filgrastim, pegfilgrastim, sargramostim -other chemotherapy drugs like cisplatin, doxorubicin, epirubicin, etoposide, teniposide, vincristine -quinidine -testosterone -vaccines -verapamil Talk to your doctor or health care professional before taking any of these medicines: -acetaminophen -aspirin -ibuprofen -ketoprofen -naproxen This list may not describe all possible interactions. Give your health care provider a list of all the medicines, herbs, non-prescription drugs, or dietary supplements you use. Also tell them if you smoke, drink alcohol, or use illegal drugs. Some items may interact with your medicine. What should I watch for while using this medicine? Your condition will be monitored carefully while you are receiving this medicine. You will need important blood work done while you are taking this medicine. This medicine can cause serious allergic reactions. To reduce your risk you will  need to take other  medicine(s) before treatment with this medicine. If you experience allergic reactions like skin rash, itching or hives, swelling of the face, lips, or tongue, tell your doctor or health care professional right away. In some cases, you may be given additional medicines to help with side effects. Follow all directions for their use. This drug may make you feel generally unwell. This is not uncommon, as chemotherapy can affect healthy cells as well as cancer cells. Report any side effects. Continue your course of treatment even though you feel ill unless your doctor tells you to stop. Call your doctor or health care professional for advice if you get a fever, chills or sore throat, or other symptoms of a cold or flu. Do not treat yourself. This drug decreases your body's ability to fight infections. Try to avoid being around people who are sick. This medicine may increase your risk to bruise or bleed. Call your doctor or health care professional if you notice any unusual bleeding. Be careful brushing and flossing your teeth or using a toothpick because you may get an infection or bleed more easily. If you have any dental work done, tell your dentist you are receiving this medicine. Avoid taking products that contain aspirin, acetaminophen, ibuprofen, naproxen, or ketoprofen unless instructed by your doctor. These medicines may hide a fever. Do not become pregnant while taking this medicine. Women should inform their doctor if they wish to become pregnant or think they might be pregnant. There is a potential for serious side effects to an unborn child. Talk to your health care professional or pharmacist for more information. Do not breast-feed an infant while taking this medicine. Men are advised not to father a child while receiving this medicine. This product may contain alcohol. Ask your pharmacist or healthcare provider if this medicine contains alcohol. Be sure to tell all healthcare providers you are  taking this medicine. Certain medicines, like metronidazole and disulfiram, can cause an unpleasant reaction when taken with alcohol. The reaction includes flushing, headache, nausea, vomiting, sweating, and increased thirst. The reaction can last from 30 minutes to several hours. What side effects may I notice from receiving this medicine? Side effects that you should report to your doctor or health care professional as soon as possible: -allergic reactions like skin rash, itching or hives, swelling of the face, lips, or tongue -low blood counts - This drug may decrease the number of white blood cells, red blood cells and platelets. You may be at increased risk for infections and bleeding. -signs of infection - fever or chills, cough, sore throat, pain or difficulty passing urine -signs of decreased platelets or bleeding - bruising, pinpoint red spots on the skin, black, tarry stools, nosebleeds -signs of decreased red blood cells - unusually weak or tired, fainting spells, lightheadedness -breathing problems -chest pain -high or low blood pressure -mouth sores -nausea and vomiting -pain, swelling, redness or irritation at the injection site -pain, tingling, numbness in the hands or feet -slow or irregular heartbeat -swelling of the ankle, feet, hands Side effects that usually do not require medical attention (report to your doctor or health care professional if they continue or are bothersome): -bone pain -complete hair loss including hair on your head, underarms, pubic hair, eyebrows, and eyelashes -changes in the color of fingernails -diarrhea -loosening of the fingernails -loss of appetite -muscle or joint pain -red flush to skin -sweating This list may not describe all possible side effects. Call your doctor for medical  advice about side effects. You may report side effects to FDA at 1-800-FDA-1088. Where should I keep my medicine? This drug is given in a hospital or clinic and  will not be stored at home. NOTE: This sheet is a summary. It may not cover all possible information. If you have questions about this medicine, talk to your doctor, pharmacist, or health care provider.  2018 Elsevier/Gold Standard (2015-04-23 19:58:00)   Carboplatin injection What is this medicine? CARBOPLATIN (KAR boe pla tin) is a chemotherapy drug. It targets fast dividing cells, like cancer cells, and causes these cells to die. This medicine is used to treat ovarian cancer and many other cancers. This medicine may be used for other purposes; ask your health care provider or pharmacist if you have questions. COMMON BRAND NAME(S): Paraplatin What should I tell my health care provider before I take this medicine? They need to know if you have any of these conditions: -blood disorders -hearing problems -kidney disease -recent or ongoing radiation therapy -an unusual or allergic reaction to carboplatin, cisplatin, other chemotherapy, other medicines, foods, dyes, or preservatives -pregnant or trying to get pregnant -breast-feeding How should I use this medicine? This drug is usually given as an infusion into a vein. It is administered in a hospital or clinic by a specially trained health care professional. Talk to your pediatrician regarding the use of this medicine in children. Special care may be needed. Overdosage: If you think you have taken too much of this medicine contact a poison control center or emergency room at once. NOTE: This medicine is only for you. Do not share this medicine with others. What if I miss a dose? It is important not to miss a dose. Call your doctor or health care professional if you are unable to keep an appointment. What may interact with this medicine? -medicines for seizures -medicines to increase blood counts like filgrastim, pegfilgrastim, sargramostim -some antibiotics like amikacin, gentamicin, neomycin, streptomycin, tobramycin -vaccines Talk to  your doctor or health care professional before taking any of these medicines: -acetaminophen -aspirin -ibuprofen -ketoprofen -naproxen This list may not describe all possible interactions. Give your health care provider a list of all the medicines, herbs, non-prescription drugs, or dietary supplements you use. Also tell them if you smoke, drink alcohol, or use illegal drugs. Some items may interact with your medicine. What should I watch for while using this medicine? Your condition will be monitored carefully while you are receiving this medicine. You will need important blood work done while you are taking this medicine. This drug may make you feel generally unwell. This is not uncommon, as chemotherapy can affect healthy cells as well as cancer cells. Report any side effects. Continue your course of treatment even though you feel ill unless your doctor tells you to stop. In some cases, you may be given additional medicines to help with side effects. Follow all directions for their use. Call your doctor or health care professional for advice if you get a fever, chills or sore throat, or other symptoms of a cold or flu. Do not treat yourself. This drug decreases your body's ability to fight infections. Try to avoid being around people who are sick. This medicine may increase your risk to bruise or bleed. Call your doctor or health care professional if you notice any unusual bleeding. Be careful brushing and flossing your teeth or using a toothpick because you may get an infection or bleed more easily. If you have any dental work done, tell  your dentist you are receiving this medicine. Avoid taking products that contain aspirin, acetaminophen, ibuprofen, naproxen, or ketoprofen unless instructed by your doctor. These medicines may hide a fever. Do not become pregnant while taking this medicine. Women should inform their doctor if they wish to become pregnant or think they might be pregnant. There is a  potential for serious side effects to an unborn child. Talk to your health care professional or pharmacist for more information. Do not breast-feed an infant while taking this medicine. What side effects may I notice from receiving this medicine? Side effects that you should report to your doctor or health care professional as soon as possible: -allergic reactions like skin rash, itching or hives, swelling of the face, lips, or tongue -signs of infection - fever or chills, cough, sore throat, pain or difficulty passing urine -signs of decreased platelets or bleeding - bruising, pinpoint red spots on the skin, black, tarry stools, nosebleeds -signs of decreased red blood cells - unusually weak or tired, fainting spells, lightheadedness -breathing problems -changes in hearing -changes in vision -chest pain -high blood pressure -low blood counts - This drug may decrease the number of white blood cells, red blood cells and platelets. You may be at increased risk for infections and bleeding. -nausea and vomiting -pain, swelling, redness or irritation at the injection site -pain, tingling, numbness in the hands or feet -problems with balance, talking, walking -trouble passing urine or change in the amount of urine Side effects that usually do not require medical attention (report to your doctor or health care professional if they continue or are bothersome): -hair loss -loss of appetite -metallic taste in the mouth or changes in taste This list may not describe all possible side effects. Call your doctor for medical advice about side effects. You may report side effects to FDA at 1-800-FDA-1088. Where should I keep my medicine? This drug is given in a hospital or clinic and will not be stored at home. NOTE: This sheet is a summary. It may not cover all possible information. If you have questions about this medicine, talk to your doctor, pharmacist, or health care provider.  2018 Elsevier/Gold  Standard (2007-09-27 14:38:05)

## 2018-06-21 ENCOUNTER — Telehealth: Payer: Self-pay | Admitting: *Deleted

## 2018-06-21 ENCOUNTER — Telehealth: Payer: Self-pay

## 2018-06-21 NOTE — Telephone Encounter (Signed)
Forwarding to [employer] nurse.

## 2018-06-21 NOTE — Telephone Encounter (Signed)
Pt called this morning with c/o redness that started today. Pt verbalized on VM that she would explain further upon return call from the [facility]. Called pt back on number provided [phone]. No answer. LVM for pt to call back to discuss her symptom. Noted that main line will transfer off site at 1630 and that this RN would be out of the office at 1730.

## 2018-06-21 NOTE — Telephone Encounter (Signed)
-----   Message from Ishmael Holter, RN sent at 06/20/2018  4:56 PM EST ----- Regarding: Dr Alvy Bimler 1st tx f/u call 1st tx f/u call

## 2018-06-21 NOTE — Telephone Encounter (Signed)
"  Jordan Martinez calling again.  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  Yesterday's information was to call immediately for symptoms.  Left yesterday at 5:30 pm after first chemotherapy.  Today my face, chest, wrist and elbows are red like a sun burn."  Denies itching, or rash.  Will check temperature and return call.  Reviewed dexamethasone side effect of warmth and redness.   Jordan Martinez chemotherapy F/U reveals she is doing well.  Denies n/v or any further side effects or symptoms.  "Colace the past two days but bowels have not moved.  Will begin Senokot as Dr. Alvy Bimler instructed.  Nothing new with urine.  Three antibiotics the past six weeks for UTI.  Eating well trying to drink 64 oz water daily."   I instructed to drink 64 oz minimum daily or at least the day before, of and after treatment.  Asked what return call turn over rate to expect.  No further questions or needs at this time.  Encouraged to call if needed.  Reviewed how to call after hours and to call 911 for a medical emergency.

## 2018-06-25 ENCOUNTER — Inpatient Hospital Stay (HOSPITAL_COMMUNITY)
Admission: EM | Admit: 2018-06-25 | Discharge: 2018-06-27 | DRG: 682 | Disposition: A | Discharge status: Home / Self Care | Payer: Medicare Other | Attending: Family Medicine | Admitting: Family Medicine

## 2018-06-25 ENCOUNTER — Encounter (HOSPITAL_COMMUNITY): Payer: Self-pay | Admitting: *Deleted

## 2018-06-25 ENCOUNTER — Inpatient Hospital Stay (HOSPITAL_COMMUNITY): Payer: Medicare Other

## 2018-06-25 DIAGNOSIS — M791 Myalgia, unspecified site: Secondary | ICD-10-CM | POA: Diagnosis present

## 2018-06-25 DIAGNOSIS — M170 Bilateral primary osteoarthritis of knee: Secondary | ICD-10-CM | POA: Diagnosis present

## 2018-06-25 DIAGNOSIS — Z96653 Presence of artificial knee joint, bilateral: Secondary | ICD-10-CM | POA: Diagnosis present

## 2018-06-25 DIAGNOSIS — T451X5A Adverse effect of antineoplastic and immunosuppressive drugs, initial encounter: Secondary | ICD-10-CM | POA: Diagnosis present

## 2018-06-25 DIAGNOSIS — I10 Essential (primary) hypertension: Secondary | ICD-10-CM | POA: Diagnosis present

## 2018-06-25 DIAGNOSIS — Z87442 Personal history of urinary calculi: Secondary | ICD-10-CM

## 2018-06-25 DIAGNOSIS — E7800 Pure hypercholesterolemia, unspecified: Secondary | ICD-10-CM | POA: Diagnosis present

## 2018-06-25 DIAGNOSIS — R197 Diarrhea, unspecified: Secondary | ICD-10-CM | POA: Diagnosis present

## 2018-06-25 DIAGNOSIS — N390 Urinary tract infection, site not specified: Secondary | ICD-10-CM | POA: Diagnosis present

## 2018-06-25 DIAGNOSIS — E1165 Type 2 diabetes mellitus with hyperglycemia: Secondary | ICD-10-CM | POA: Diagnosis present

## 2018-06-25 DIAGNOSIS — D61818 Other pancytopenia: Secondary | ICD-10-CM | POA: Diagnosis not present

## 2018-06-25 DIAGNOSIS — E119 Type 2 diabetes mellitus without complications: Secondary | ICD-10-CM | POA: Diagnosis not present

## 2018-06-25 DIAGNOSIS — E876 Hypokalemia: Secondary | ICD-10-CM | POA: Diagnosis present

## 2018-06-25 DIAGNOSIS — D701 Agranulocytosis secondary to cancer chemotherapy: Secondary | ICD-10-CM | POA: Diagnosis not present

## 2018-06-25 DIAGNOSIS — N3000 Acute cystitis without hematuria: Secondary | ICD-10-CM

## 2018-06-25 DIAGNOSIS — N179 Acute kidney failure, unspecified: Secondary | ICD-10-CM | POA: Diagnosis present

## 2018-06-25 DIAGNOSIS — R82998 Other abnormal findings in urine: Secondary | ICD-10-CM

## 2018-06-25 DIAGNOSIS — E1140 Type 2 diabetes mellitus with diabetic neuropathy, unspecified: Secondary | ICD-10-CM | POA: Diagnosis present

## 2018-06-25 DIAGNOSIS — F17210 Nicotine dependence, cigarettes, uncomplicated: Secondary | ICD-10-CM | POA: Diagnosis present

## 2018-06-25 DIAGNOSIS — C541 Malignant neoplasm of endometrium: Secondary | ICD-10-CM | POA: Diagnosis present

## 2018-06-25 DIAGNOSIS — Z87440 Personal history of urinary (tract) infections: Secondary | ICD-10-CM

## 2018-06-25 DIAGNOSIS — D61810 Antineoplastic chemotherapy induced pancytopenia: Secondary | ICD-10-CM | POA: Diagnosis present

## 2018-06-25 DIAGNOSIS — E860 Dehydration: Secondary | ICD-10-CM | POA: Diagnosis present

## 2018-06-25 DIAGNOSIS — E785 Hyperlipidemia, unspecified: Secondary | ICD-10-CM | POA: Diagnosis present

## 2018-06-25 DIAGNOSIS — Z90710 Acquired absence of both cervix and uterus: Secondary | ICD-10-CM

## 2018-06-25 DIAGNOSIS — K5900 Constipation, unspecified: Secondary | ICD-10-CM | POA: Diagnosis present

## 2018-06-25 DIAGNOSIS — Z452 Encounter for adjustment and management of vascular access device: Secondary | ICD-10-CM

## 2018-06-25 DIAGNOSIS — B961 Klebsiella pneumoniae [K. pneumoniae] as the cause of diseases classified elsewhere: Secondary | ICD-10-CM | POA: Diagnosis present

## 2018-06-25 DIAGNOSIS — Z800 Family history of malignant neoplasm of digestive organs: Secondary | ICD-10-CM

## 2018-06-25 DIAGNOSIS — D696 Thrombocytopenia, unspecified: Secondary | ICD-10-CM | POA: Diagnosis present

## 2018-06-25 DIAGNOSIS — Z90722 Acquired absence of ovaries, bilateral: Secondary | ICD-10-CM

## 2018-06-25 DIAGNOSIS — Z7982 Long term (current) use of aspirin: Secondary | ICD-10-CM

## 2018-06-25 DIAGNOSIS — Z79899 Other long term (current) drug therapy: Secondary | ICD-10-CM

## 2018-06-25 DIAGNOSIS — K219 Gastro-esophageal reflux disease without esophagitis: Secondary | ICD-10-CM | POA: Diagnosis present

## 2018-06-25 DIAGNOSIS — Z888 Allergy status to other drugs, medicaments and biological substances status: Secondary | ICD-10-CM

## 2018-06-25 DIAGNOSIS — Z8481 Family history of carrier of genetic disease: Secondary | ICD-10-CM

## 2018-06-25 DIAGNOSIS — Z9089 Acquired absence of other organs: Secondary | ICD-10-CM

## 2018-06-25 DIAGNOSIS — Z791 Long term (current) use of non-steroidal anti-inflammatories (NSAID): Secondary | ICD-10-CM

## 2018-06-25 LAB — URINALYSIS, ROUTINE W REFLEX MICROSCOPIC
Glucose, UA: NEGATIVE mg/dL
HGB URINE DIPSTICK: NEGATIVE
Nitrite: POSITIVE — AB
Protein, ur: 30 mg/dL — AB
Specific Gravity, Urine: >1.03 — ABNORMAL HIGH (ref 1.005–1.030)
pH: 5 (ref 5.0–8.0)

## 2018-06-25 LAB — CBC WITH DIFFERENTIAL/PLATELET
Abs Immature Granulocytes: 0.03 10*3/uL (ref 0.00–0.07)
Basophils Absolute: 0 10*3/uL (ref 0.0–0.1)
Basophils Relative: 1 %
Eosinophils Absolute: 0 10*3/uL (ref 0.0–0.5)
Eosinophils Relative: 1 %
HCT: 51.2 % — ABNORMAL HIGH (ref 36.0–46.0)
Hemoglobin: 16.9 g/dL — ABNORMAL HIGH (ref 12.0–15.0)
Immature Granulocytes: 1 %
Lymphocytes Relative: 23 %
Lymphs Abs: 0.9 10*3/uL (ref 0.7–4.0)
MCH: 32.8 pg (ref 26.0–34.0)
MCHC: 33 g/dL (ref 30.0–36.0)
MCV: 99.2 fL (ref 80.0–100.0)
Monocytes Absolute: 0.1 10*3/uL (ref 0.1–1.0)
Monocytes Relative: 3 %
Neutro Abs: 2.7 10*3/uL (ref 1.7–7.7)
Neutrophils Relative %: 71 %
Platelets: 157 10*3/uL (ref 150–400)
RBC: 5.16 MIL/uL — ABNORMAL HIGH (ref 3.87–5.11)
RDW: 13.2 % (ref 11.5–15.5)
WBC: 3.7 10*3/uL — ABNORMAL LOW (ref 4.0–10.5)
nRBC: 0 % (ref 0.0–0.2)

## 2018-06-25 LAB — COMPREHENSIVE METABOLIC PANEL
ALK PHOS: 105 U/L (ref 38–126)
ALT: 41 U/L (ref 0–44)
AST: 26 U/L (ref 15–41)
Albumin: 4.1 g/dL (ref 3.5–5.0)
Anion gap: 16 — ABNORMAL HIGH (ref 5–15)
BUN: 48 mg/dL — ABNORMAL HIGH (ref 8–23)
CO2: 23 mmol/L (ref 22–32)
Calcium: 9.2 mg/dL (ref 8.9–10.3)
Chloride: 96 mmol/L — ABNORMAL LOW (ref 98–111)
Creatinine, Ser: 2.28 mg/dL — ABNORMAL HIGH (ref 0.44–1.00)
GFR calc Af Amer: 25 mL/min — ABNORMAL LOW (ref >60)
GFR calc non Af Amer: 22 mL/min — ABNORMAL LOW (ref >60)
Glucose, Bld: 258 mg/dL — ABNORMAL HIGH (ref 70–99)
Potassium: 4.4 mmol/L (ref 3.5–5.1)
Sodium: 135 mmol/L (ref 135–145)
Total Bilirubin: 1.7 mg/dL — ABNORMAL HIGH (ref 0.3–1.2)
Total Protein: 7.1 g/dL (ref 6.5–8.1)

## 2018-06-25 LAB — SODIUM, URINE, RANDOM: Sodium, Ur: 14 mmol/L

## 2018-06-25 LAB — GLUCOSE, CAPILLARY: Glucose-Capillary: 253 mg/dL — ABNORMAL HIGH (ref 70–99)

## 2018-06-25 LAB — CREATININE, URINE, RANDOM: Creatinine, Urine: 184.26 mg/dL

## 2018-06-25 LAB — LIPASE, BLOOD: Lipase: 22 U/L (ref 11–51)

## 2018-06-25 MED ORDER — INSULIN ASPART 100 UNIT/ML ~~LOC~~ SOLN
0.0000 [IU] | Freq: Three times a day (TID) | SUBCUTANEOUS | Status: DC
  Administered 2018-06-26 (×3): 2 [IU] via SUBCUTANEOUS
  Administered 2018-06-27: 1 [IU] via SUBCUTANEOUS
  Administered 2018-06-27: 3 [IU] via SUBCUTANEOUS

## 2018-06-25 MED ORDER — TRAMADOL HCL 50 MG PO TABS: 50.0000 mg | ORAL_TABLET | Freq: Four times a day (QID) | ORAL | Status: DC | PRN

## 2018-06-25 MED ORDER — ACETAMINOPHEN 650 MG RE SUPP: 650.0000 mg | Freq: Four times a day (QID) | RECTAL | Status: DC | PRN

## 2018-06-25 MED ORDER — SODIUM CHLORIDE 0.9 % IV SOLN
INTRAVENOUS | Status: DC
  Administered 2018-06-26: 1000 mL via INTRAVENOUS
  Administered 2018-06-26 – 2018-06-27 (×2): via INTRAVENOUS

## 2018-06-25 MED ORDER — ATORVASTATIN CALCIUM 20 MG PO TABS
20.0000 mg | ORAL_TABLET | Freq: Every day | ORAL | Status: DC
  Administered 2018-06-25 – 2018-06-26 (×2): 20 mg via ORAL
  Filled 2018-06-25 (×2): qty 1

## 2018-06-25 MED ORDER — INSULIN ASPART 100 UNIT/ML ~~LOC~~ SOLN
0.0000 [IU] | Freq: Every day | SUBCUTANEOUS | Status: DC
  Administered 2018-06-25: 3 [IU] via SUBCUTANEOUS

## 2018-06-25 MED ORDER — CYCLOBENZAPRINE HCL 5 MG PO TABS
5.0000 mg | ORAL_TABLET | Freq: Three times a day (TID) | ORAL | Status: DC | PRN
  Administered 2018-06-26: 5 mg via ORAL
  Filled 2018-06-25 (×2): qty 1

## 2018-06-25 MED ORDER — ATENOLOL 50 MG PO TABS
100.0000 mg | ORAL_TABLET | Freq: Every day | ORAL | Status: DC
  Administered 2018-06-26: 100 mg via ORAL
  Filled 2018-06-25: qty 2

## 2018-06-25 MED ORDER — FENTANYL CITRATE (PF) 100 MCG/2ML IJ SOLN
25.0000 ug | INTRAMUSCULAR | Status: DC | PRN
  Administered 2018-06-25 – 2018-06-26 (×3): 25 ug via INTRAVENOUS
  Filled 2018-06-25 (×3): qty 2

## 2018-06-25 MED ORDER — KETOROLAC TROMETHAMINE 30 MG/ML IJ SOLN
15.0000 mg | Freq: Once | INTRAMUSCULAR | Status: AC
  Administered 2018-06-25: 15 mg via INTRAVENOUS
  Filled 2018-06-25: qty 1

## 2018-06-25 MED ORDER — SODIUM CHLORIDE 0.9 % IV BOLUS
500.0000 mL | Freq: Once | INTRAVENOUS | Status: AC
  Administered 2018-06-25: 500 mL via INTRAVENOUS

## 2018-06-25 MED ORDER — PANTOPRAZOLE SODIUM 40 MG PO TBEC
40.0000 mg | DELAYED_RELEASE_TABLET | Freq: Every day | ORAL | Status: DC
  Administered 2018-06-26 – 2018-06-27 (×2): 40 mg via ORAL
  Filled 2018-06-25 (×2): qty 1

## 2018-06-25 MED ORDER — ONDANSETRON HCL 4 MG/2ML IJ SOLN: 4.0000 mg | Freq: Four times a day (QID) | INTRAMUSCULAR | Status: DC | PRN

## 2018-06-25 MED ORDER — SODIUM CHLORIDE 0.9 % IV BOLUS
1000.0000 mL | Freq: Once | INTRAVENOUS | Status: AC
  Administered 2018-06-25: 1000 mL via INTRAVENOUS

## 2018-06-25 MED ORDER — ONDANSETRON HCL 4 MG PO TABS: 4.0000 mg | ORAL_TABLET | Freq: Four times a day (QID) | ORAL | Status: DC | PRN

## 2018-06-25 MED ORDER — HEPARIN SODIUM (PORCINE) 5000 UNIT/ML IJ SOLN
5000.0000 [IU] | Freq: Three times a day (TID) | INTRAMUSCULAR | Status: DC
  Administered 2018-06-25 – 2018-06-27 (×5): 5000 [IU] via SUBCUTANEOUS
  Filled 2018-06-25 (×6): qty 1

## 2018-06-25 MED ORDER — FENTANYL CITRATE (PF) 100 MCG/2ML IJ SOLN
25.0000 ug | Freq: Once | INTRAMUSCULAR | Status: AC
  Administered 2018-06-25: 25 ug via INTRAVENOUS
  Filled 2018-06-25: qty 2

## 2018-06-25 MED ORDER — ACETAMINOPHEN 325 MG PO TABS: 650.0000 mg | ORAL_TABLET | Freq: Four times a day (QID) | ORAL | Status: DC | PRN

## 2018-06-25 MED ORDER — GLYCERIN-HYPROMELLOSE-PEG 400 0.2-0.2-1 % OP SOLN
1.0000 [drp] | Freq: Every day | OPHTHALMIC | Status: DC
  Administered 2018-06-26 – 2018-06-27 (×2): 1 [drp] via OPHTHALMIC
  Filled 2018-06-25: qty 15

## 2018-06-25 MED ORDER — CIPROFLOXACIN IN D5W 400 MG/200ML IV SOLN
400.0000 mg | Freq: Once | INTRAVENOUS | Status: AC
  Administered 2018-06-25: 400 mg via INTRAVENOUS
  Filled 2018-06-25: qty 200

## 2018-06-25 MED ORDER — ALBUTEROL SULFATE (2.5 MG/3ML) 0.083% IN NEBU: 2.5000 mg | INHALATION_SOLUTION | Freq: Four times a day (QID) | RESPIRATORY_TRACT | Status: DC | PRN

## 2018-06-25 NOTE — Progress Notes (Signed)
ED TO INPATIENT HANDOFF REPORT  Name/Age/Gender Jordan Martinez 67 y.o. female  Code Status    Code Status Orders  (From admission, onward)         Start     Ordered   06/25/18 2114  Full code  Continuous     06/25/18 2118        Code Status History    Date Active Date Inactive Code Status Order ID Comments User Context   05/03/2018 1730 05/04/2018 1544 Full Code [id number]  Lahoma Crocker, MD Inpatient   05/03/2018 0925 05/03/2018 1730 Full Code [id number]  Dorothyann Gibbs, NP Inpatient      Home/SNF/Other Home  Chief Complaint diarrhea, weak   Level of Care/Admitting Diagnosis ED Disposition    ED Disposition Condition [place]: [facility] [[id number]]  Level of Care: Med-Surg [16]  Diagnosis: ARF (acute renal failure) [facility]) [[id number]]  Admitting Physician: Norval Morton [[id number]]  Attending Physician: Norval Morton [[id number]]  Estimated length of stay: past midnight tomorrow  Certification:: I certify this patient will need inpatient services for at least 2 midnights  PT Class (Do Not Modify): Inpatient [101]  PT Acc Code (Do Not Modify): Private [1]       Medical History Past Medical History:  Diagnosis Date  . Anxiety   . Arthritis   . Depression   . Diabetes mellitus without complication (HCC)    diet controlled. States always has borderline HgbA1C  . Endometrial cancer ([place])   . Family history of colon cancer   . Family history of genetic mutation for hereditary nonpolyposis colorectal cancer (HNPCC)   . GERD (gastroesophageal reflux disease)   . High cholesterol   . History of kidney stones    years   . Hypertension   . Neuropathy   . Sensation of pressure in bladder area   . Uterine cramping     Allergies Allergies  Allergen Reactions  . Amlodipine     Blurred vision   . Compazine [Prochlorperazine Edisylate]     Stroke symptoms,  . Gabapentin     Blurred vision and nystagmus  . Lyrica  [Pregabalin]     Blurred Vision   . Sertraline Other (See Comments)    Visual disturbances.     IV Location/Drains/Wounds Patient Lines/Drains/Airways Status   Active Line/Drains/Airways    Name:   Placement date:   Placement time:   Site:   Days:   Implanted Port 06/15/18 Right Chest   06/15/18    1200    Chest   10   Incision (Closed) 05/03/18 Abdomen Other (Comment)   05/03/18    1438     53   Incision - 5 Ports Abdomen 1: Right;Lateral 2: Right;Medial 3: Umbilicus 4: Left;Lateral;Lower 5: Left;Lateral;Upper   05/03/18    1345     53          Labs/Imaging Results for orders placed or performed during the hospital encounter of 06/25/18 (from the past 48 hour(s))  Urinalysis, Routine w reflex microscopic     Status: Abnormal   Collection Time: 06/25/18  1:58 PM  Result Value Ref Range   Color, Urine ORANGE (A) YELLOW    Comment: BIOCHEMICALS MAY BE AFFECTED BY COLOR   APPearance CLOUDY (A) CLEAR   Specific Gravity, Urine >1.030 (H) 1.005 - 1.030   pH 5.0 5.0 - 8.0   Glucose, UA NEGATIVE NEGATIVE mg/dL   Hgb urine dipstick NEGATIVE NEGATIVE  Bilirubin Urine MODERATE (A) NEGATIVE   Ketones, ur TRACE (A) NEGATIVE mg/dL   Protein, ur 30 (A) NEGATIVE mg/dL   Nitrite POSITIVE (A) NEGATIVE   Leukocytes, UA TRACE (A) NEGATIVE    Comment: Performed at [facility], [place] [address] [place], [place] [address]  Lipase, blood     Status: None   Collection Time: 06/25/18  4:17 PM  Result Value Ref Range   Lipase 22 11 - 51 U/L    Comment: Performed at [facility], [place] [address] [place], [place] [address]  Comprehensive metabolic panel     Status: Abnormal   Collection Time: 06/25/18  4:17 PM  Result Value Ref Range   Sodium 135 135 - 145 mmol/L   Potassium 4.4 3.5 - 5.1 mmol/L   Chloride 96 (L) 98 - 111 mmol/L   CO2 23 22 - 32 mmol/L   Glucose, Bld 258 (H) 70 - 99 mg/dL   BUN 48 (H) 8 - 23 mg/dL   Creatinine, Ser 2.28 (H) 0.44 - 1.00  mg/dL   Calcium 9.2 8.9 - 10.3 mg/dL   Total Protein 7.1 6.5 - 8.1 g/dL   Albumin 4.1 3.5 - 5.0 g/dL   AST 26 15 - 41 U/L   ALT 41 0 - 44 U/L   Alkaline Phosphatase 105 38 - 126 U/L   Total Bilirubin 1.7 (H) 0.3 - 1.2 mg/dL   GFR calc non Af Amer 22 (L) >60 mL/min   GFR calc Af Amer 25 (L) >60 mL/min   Anion gap 16 (H) 5 - 15    Comment: Performed at [facility], [place] [address], [place], [place] [address]  CBC with Differential     Status: Abnormal   Collection Time: 06/25/18  4:17 PM  Result Value Ref Range   WBC 3.7 (L) 4.0 - 10.5 K/uL   RBC 5.16 (H) 3.87 - 5.11 MIL/uL   Hemoglobin 16.9 (H) 12.0 - 15.0 g/dL   HCT 51.2 (H) 36.0 - 46.0 %   MCV 99.2 80.0 - 100.0 fL   MCH 32.8 26.0 - 34.0 pg   MCHC 33.0 30.0 - 36.0 g/dL   RDW 13.2 11.5 - 15.5 %   Platelets 157 150 - 400 K/uL   nRBC 0.0 0.0 - 0.2 %   Neutrophils Relative % 71 %   Neutro Abs 2.7 1.7 - 7.7 K/uL   Lymphocytes Relative 23 %   Lymphs Abs 0.9 0.7 - 4.0 K/uL   Monocytes Relative 3 %   Monocytes Absolute 0.1 0.1 - 1.0 K/uL   Eosinophils Relative 1 %   Eosinophils Absolute 0.0 0.0 - 0.5 K/uL   Basophils Relative 1 %   Basophils Absolute 0.0 0.0 - 0.1 K/uL   Immature Granulocytes 1 %   Abs Immature Granulocytes 0.03 0.00 - 0.07 K/uL    Comment: Performed at [facility], [place] [address] [place], [place] [address]   No results found.  Pending Labs Unresulted Labs (From admission, onward)    Start     Ordered   06/26/18 0500  CBC  Tomorrow morning,   R     06/25/18 2118   06/26/18 0867  Basic metabolic panel  Tomorrow morning,   R     06/25/18 2118   06/26/18 0500  Magnesium  Tomorrow morning,   R     06/25/18 2118   06/25/18 2115  Creatinine, urine, random  ONCE - STAT,   R  06/25/18 2118   06/25/18 2115  Sodium, urine, random  ONCE - STAT,   R     06/25/18 2118   06/25/18 1530  Urine culture  ONCE - STAT,   STAT     06/25/18 1529          Vitals/Pain Today's  Vitals   06/25/18 2008 06/25/18 2030 06/25/18 2100 06/25/18 2130  BP: (!) 107/54 (!) 97/50 (!) 111/38 (!) 106/59  Pulse: 69 71 73 73  Resp: 18     Temp:      TempSrc:      SpO2: 95% (!) 89% 97% 96%  PainSc:        Isolation Precautions No active isolations  Medications Medications  ciprofloxacin (CIPRO) IVPB 400 mg (400 mg Intravenous New Bag/Given 06/25/18 2127)  sodium chloride 0.9 % bolus 1,000 mL (1,000 mLs Intravenous New Bag/Given 06/25/18 2123)  heparin injection 5,000 Units (has no administration in time range)  0.9 %  sodium chloride infusion (has no administration in time range)  acetaminophen (TYLENOL) tablet 650 mg (has no administration in time range)    Or  acetaminophen (TYLENOL) suppository 650 mg (has no administration in time range)  fentaNYL (SUBLIMAZE) injection 25 mcg (has no administration in time range)  ondansetron (ZOFRAN) tablet 4 mg (has no administration in time range)    Or  ondansetron (ZOFRAN) injection 4 mg (has no administration in time range)  albuterol (PROVENTIL) (2.5 MG/3ML) 0.083% nebulizer solution 2.5 mg (has no administration in time range)  insulin aspart (novoLOG) injection 0-5 Units (has no administration in time range)  insulin aspart (novoLOG) injection 0-9 Units (has no administration in time range)  sodium chloride 0.9 % bolus 1,000 mL (0 mLs Intravenous Stopped 06/25/18 1655)  ketorolac (TORADOL) 30 MG/ML injection 15 mg (15 mg Intravenous Given 06/25/18 1553)  sodium chloride 0.9 % bolus 500 mL (0 mLs Intravenous Stopped 06/25/18 1937)  fentaNYL (SUBLIMAZE) injection 25 mcg (25 mcg Intravenous Given 06/25/18 2104)    Mobility walks
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
## 2018-06-25 NOTE — ED Notes (Signed)
Patient is aware that urine sample is needed. 

## 2018-06-25 NOTE — ED Provider Notes (Signed)
  Face-to-face evaluation   History: Patient here for evaluation of nausea and diarrhea, onset after chemotherapy earlier this week.  She complains of abdominal cramping.  She has had general aching as well.  Physical exam: Alert, calm, cooperative.  She is lucid.  She is mildly anxious.  No dysarthria or aphasia.  She moves extremities equally.  Medical screening examination/treatment/procedure(s) were conducted as a shared visit with non-physician practitioner(s) and myself.  I personally evaluated the patient during the encounter    Daleen Bo, MD 06/26/18 1540

## 2018-06-25 NOTE — ED Notes (Signed)
Patient had sudden left arm weakness. PA made aware.

## 2018-06-25 NOTE — ED Triage Notes (Signed)
Pt has had diarrhea since this morning. Pt has hx of endometrial cancer, had hysterectomy last month. Pt received chemotherapy for the first time 5 days ago. Pt took decadron before chemotherapy started. Pt states she has abdominal cramping since the surgery.

## 2018-06-25 NOTE — ED Notes (Signed)
Urine culture sent down to lab with urinalysis. 
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
## 2018-06-25 NOTE — ED Notes (Signed)
Patient request lab draw from port. 

## 2018-06-25 NOTE — Progress Notes (Signed)
Pharmacy Note   A consult was received from an ED physician for ciprofloxacin per pharmacy dosing.    The patient's profile has been reviewed for ht/wt/allergies/indication/available labs.    A one time order has been placed for ciprofloxacin 400 mg IV x 1.  Further antibiotics/pharmacy consults should be ordered by admitting physician if indicated.                       Thank you,  Royetta Asal, PharmD, BCPS Pager [phone] 06/25/2018 9:00 PM

## 2018-06-26 ENCOUNTER — Other Ambulatory Visit: Payer: Self-pay

## 2018-06-26 DIAGNOSIS — R197 Diarrhea, unspecified: Secondary | ICD-10-CM | POA: Diagnosis present

## 2018-06-26 DIAGNOSIS — D701 Agranulocytosis secondary to cancer chemotherapy: Secondary | ICD-10-CM | POA: Diagnosis present

## 2018-06-26 DIAGNOSIS — T451X5A Adverse effect of antineoplastic and immunosuppressive drugs, initial encounter: Secondary | ICD-10-CM

## 2018-06-26 LAB — GLUCOSE, CAPILLARY
Glucose-Capillary: 164 mg/dL — ABNORMAL HIGH (ref 70–99)
Glucose-Capillary: 174 mg/dL — ABNORMAL HIGH (ref 70–99)
Glucose-Capillary: 194 mg/dL — ABNORMAL HIGH (ref 70–99)
Glucose-Capillary: 135 mg/dL — ABNORMAL HIGH (ref 70–99)

## 2018-06-26 LAB — BASIC METABOLIC PANEL
Anion gap: 11 (ref 5–15)
BUN: 38 mg/dL — ABNORMAL HIGH (ref 8–23)
CO2: 20 mmol/L — ABNORMAL LOW (ref 22–32)
CREATININE: 1.18 mg/dL — AB (ref 0.44–1.00)
Calcium: 7.6 mg/dL — ABNORMAL LOW (ref 8.9–10.3)
Chloride: 107 mmol/L (ref 98–111)
GFR calc Af Amer: 55 mL/min — ABNORMAL LOW (ref >60)
GFR calc non Af Amer: 48 mL/min — ABNORMAL LOW (ref >60)
Glucose, Bld: 144 mg/dL — ABNORMAL HIGH (ref 70–99)
Potassium: 3.3 mmol/L — ABNORMAL LOW (ref 3.5–5.1)
Sodium: 138 mmol/L (ref 135–145)

## 2018-06-26 LAB — MAGNESIUM: Magnesium: 2.4 mg/dL (ref 1.7–2.4)

## 2018-06-26 LAB — HEMOGLOBIN A1C
Hgb A1c MFr Bld: 7.2 % — ABNORMAL HIGH (ref 4.8–5.6)
Mean Plasma Glucose: 159.94 mg/dL

## 2018-06-26 MED ORDER — CIPROFLOXACIN IN D5W 400 MG/200ML IV SOLN: 400.0000 mg | INTRAVENOUS | Status: DC

## 2018-06-26 MED ORDER — CIPROFLOXACIN HCL 500 MG PO TABS: 500.0000 mg | ORAL_TABLET | Freq: Two times a day (BID) | ORAL | Status: DC

## 2018-06-26 MED ORDER — OXYCODONE-ACETAMINOPHEN 5-325 MG PO TABS
1.0000 | ORAL_TABLET | Freq: Four times a day (QID) | ORAL | Status: DC | PRN
  Administered 2018-06-26 – 2018-06-27 (×2): 2 via ORAL
  Filled 2018-06-26: qty 2
  Filled 2018-06-26: qty 1
  Filled 2018-06-26: qty 2
  Filled 2018-06-26: qty 1

## 2018-06-26 MED ORDER — POTASSIUM CHLORIDE CRYS ER 20 MEQ PO TBCR
40.0000 meq | EXTENDED_RELEASE_TABLET | Freq: Once | ORAL | Status: AC
  Administered 2018-06-26: 40 meq via ORAL
  Filled 2018-06-26: qty 2

## 2018-06-26 NOTE — Progress Notes (Signed)
Pharmacy: abx dose adjustment Cipro 400 mg IV q24 for UTI.  SCr 2.28>>1.18  Plan: change Cipro 400 IV q24 to cipro 500 mg PO bid for improved renal fxn  Eudelia Bunch, Pharm.D 06/26/2018 10:46 AM

## 2018-06-26 NOTE — Progress Notes (Signed)
PROGRESS NOTE        PATIENT DETAILS Name: Jordan Martinez Age: 67 y.o. Sex: female Date of Birth: [birth year] Admit Date: 06/25/2018 Admitting Physician Norval Morton, MD MV[facility]:QIONGE, Christian Mate, MD  Brief Narrative: Patient is a 67 y.o. female with history of endometrial cancer status post hysterectomy with BSO October 2019-just started on chemotherapy 1 week back presenting with generalized body aches, vomiting and constipation.  Upon further evaluation she was found to have acute kidney injury-she was subsequently admitted to the hospitalist service for further evaluation and treatment.  Subjective: Feels better-had a few BMs yesterday-and one loose BM earlier this morning (she was constipated at home-and had taken multiple times at home)  Assessment/Plan: Acute kidney injury: Likely hemodynamically mediated-secondary to poor oral intake, lisinopril/HCTZ and Celebrex use.  Renal function has improved markedly with just supportive care.  Lisinopril/HCTZ remain on hold.  UA without proteinuria, renal ultrasound without hydronephrosis.  Decrease IV fluids to 50 cc an hour-and recheck electrolytes tomorrow morning.  ?  UTI vs asymptomatic bacteriuria: Claims has had frequent UTIs in the past-although immunocompromised-she really does not have any symptoms that are suggestive of UTI (claims to have pressure in the pelvic area)-UA really not very convincing of UTI-best to stop antimicrobial therapy and observe.  Stop Cipro.  Diarrhea: Secondary to laxative that she took prior to this hospital stay-stools are now loose and watery this morning.  Do not think she has C. difficile at this point.  Diarrhea/loose stools improving-follow for now.  If symptoms worsen-will consider stool studies at that point.  Leukopenia/thrombocytopenia: Appears to be mild-likely secondary to recent chemotherapy-follow for now.  DM-2 with hyperglycemia: A1c 7.2-hyperglycemia likely secondary  to steroid/Decadron use with chemotherapy.  Continue SSI-CBGs are stable.  Will require a oral agent on discharge.   Hypokalemia: Replete and recheck  Hypertension: Controlled-continue with atenolol-lisinopril/HCTZ remains on hold.  Dyslipidemia: Continue statin  Deconditioning/debility: Has OA and mostly right>> left knee pain.  Appears to have severe myalgias and arthralgias after starting chemotherapy.Appears to be weak than her usual baseline-obtain PT eval today.  DVT Prophylaxis: Prophylactic Heparin  Code Status: Full code  Family Communication: None at bedside  Disposition Plan: Remain inpatient-home on 12/23 if clinical improvement continues.  Antimicrobial agents: Anti-infectives (From admission, onward)   Start     Dose/Rate Route Frequency Ordered Stop   06/26/18 2000  ciprofloxacin (CIPRO) IVPB 400 mg  Status:  Discontinued     400 mg 200 mL/hr over 60 Minutes Intravenous Every 24 hours 06/26/18 0609 06/26/18 1044   06/26/18 1045  ciprofloxacin (CIPRO) tablet 500 mg     500 mg Oral 2 times daily 06/26/18 1044     06/25/18 2115  ciprofloxacin (CIPRO) IVPB 400 mg     400 mg 200 mL/hr over 60 Minutes Intravenous  Once 06/25/18 2100 06/25/18 2227      Procedures: None  CONSULTS:  None  Time spent: 25 minutes-Greater than 50% of this time was spent in counseling, explanation of diagnosis, planning of further management, and coordination of care.  MEDICATIONS: Scheduled Meds: . atenolol  100 mg Oral QHS  . atorvastatin  20 mg Oral QHS  . ciprofloxacin  500 mg Oral BID  . Glycerin-Hypromellose-PEG 400  1 drop Both Eyes Daily  . heparin  5,000 Units Subcutaneous Q8H  . insulin aspart  0-5 Units Subcutaneous  QHS  . insulin aspart  0-9 Units Subcutaneous TID WC  . pantoprazole  40 mg Oral Daily  . potassium chloride  40 mEq Oral Once   Continuous Infusions: . sodium chloride 1,000 mL (06/26/18 1024)   PRN Meds:.acetaminophen **OR** acetaminophen,  albuterol, cyclobenzaprine, fentaNYL (SUBLIMAZE) injection, ondansetron **OR** ondansetron (ZOFRAN) IV, traMADol   PHYSICAL EXAM: Vital signs: Vitals:   06/25/18 2213 06/25/18 2251 06/26/18 0215 06/26/18 0617  BP: 119/65 (!) 93/47 137/64 116/74  Pulse: 77 74 78 91  Resp: 16 17 17 18   Temp:  99.1 F (37.3 C) 99.2 F (37.3 C) 98.9 F (37.2 C)  TempSrc:  Oral Oral Oral  SpO2: 91% 95% 95% 92%   There were no vitals filed for this visit. There is no height or weight on file to calculate BMI.   General appearance :Awake, alert, not in any distress. HEENT: Atraumatic and Normocephalic Neck: supple Resp:Good air entry bilaterally, no added sounds  CVS: S1 S2 regular, no murmurs.  GI: Bowel sounds present, Non tender and not distended with no gaurding, rigidity or rebound.No organomegaly Extremities: B/L Lower Ext shows no edema, both legs are warm to touch Neurology:  speech clear,Non focal, sensation is grossly intact. Psychiatric: Normal judgment and insight. Alert and oriented x 3. Normal mood. Musculoskeletal:No digital cyanosis Skin:No Rash, warm and dry Wounds:N/A  I have personally reviewed following labs and imaging studies  LABORATORY DATA: CBC: Recent Labs  Lab 06/25/18 1617 06/26/18 0529  WBC 3.7* 2.5*  NEUTROABS 2.7  --   HGB 16.9* 12.5  HCT 51.2* 38.6  MCV 99.2 97.5  PLT 157 102*    Basic Metabolic Panel: Recent Labs  Lab 06/25/18 1617 06/26/18 0529  NA 135 138  K 4.4 3.3*  CL 96* 107  CO2 23 20*  GLUCOSE 258* 144*  BUN 48* 38*  CREATININE 2.28* 1.18*  CALCIUM 9.2 7.6*  MG  --  2.4    GFR: Estimated Creatinine Clearance: 50.7 mL/min (A) (by C-G formula based on SCr of 1.18 mg/dL (H)).  Liver Function Tests: Recent Labs  Lab 06/25/18 1617  AST 26  ALT 41  ALKPHOS 105  BILITOT 1.7*  PROT 7.1  ALBUMIN 4.1   Recent Labs  Lab 06/25/18 1617  LIPASE 22   No results for input(s): AMMONIA in the last 168 hours.  Coagulation  Profile: No results for input(s): INR, PROTIME in the last 168 hours.  Cardiac Enzymes: No results for input(s): CKTOTAL, CKMB, CKMBINDEX, TROPONINI in the last 168 hours.  BNP (last 3 results) No results for input(s): PROBNP in the last 8760 hours.  HbA1C: Recent Labs    06/26/18 0534  HGBA1C 7.2*    CBG: Recent Labs  Lab 06/25/18 2259 06/26/18 0736  GLUCAP 253* 164*    Lipid Profile: No results for input(s): CHOL, HDL, LDLCALC, TRIG, CHOLHDL, LDLDIRECT in the last 72 hours.  Thyroid Function Tests: No results for input(s): TSH, T4TOTAL, FREET4, T3FREE, THYROIDAB in the last 72 hours.  Anemia Panel: No results for input(s): VITAMINB12, FOLATE, FERRITIN, TIBC, IRON, RETICCTPCT in the last 72 hours.  Urine analysis:    Component Value Date/Time   COLORURINE ORANGE (A) 06/25/2018 1358   APPEARANCEUR CLOUDY (A) 06/25/2018 1358   LABSPEC >1.030 (H) 06/25/2018 1358   PHURINE 5.0 06/25/2018 1358   GLUCOSEU NEGATIVE 06/25/2018 1358   HGBUR NEGATIVE 06/25/2018 1358   BILIRUBINUR MODERATE (A) 06/25/2018 1358   KETONESUR TRACE (A) 06/25/2018 1358   PROTEINUR 30 (A) 06/25/2018  1358   NITRITE POSITIVE (A) 06/25/2018 1358   LEUKOCYTESUR TRACE (A) 06/25/2018 1358    Sepsis Labs: Lactic Acid, Venous No results found for: LATICACIDVEN  MICROBIOLOGY: No results found for this or any previous visit (from the past 240 hour(s)).  RADIOLOGY STUDIES/RESULTS: US Renal  Result Date: 06/25/2018 CLINICAL DATA:  Acute renal failure EXAM: RENAL / URINARY TRACT ULTRASOUND COMPLETE COMPARISON:  CT 04/27/2018 FINDINGS: Right Kidney: Renal measurements: 12.8 x 5.2 x 4.5 cm = volume: 156.2 mL . Echogenicity within normal limits. No mass or hydronephrosis visualized. Left Kidney: Renal measurements: 11.7 x 5.4 x 5.9 cm = volume: 195 mL. Echogenicity within normal limits. No mass or hydronephrosis visualized. Bladder: Appears normal for degree of bladder distention. IMPRESSION: No acute  process or explanation for acute renal failure. Electronically Signed   By: Abigail Miyamoto M.D.   On: 06/25/2018 22:29   Ir Imaging Guided Port Insertion  Result Date: 06/15/2018 CLINICAL DATA:  Endometrial carcinoma and need for porta cath for chemotherapy. EXAM: IMPLANTED PORT A CATH PLACEMENT WITH ULTRASOUND AND FLUOROSCOPIC GUIDANCE ANESTHESIA/SEDATION: 4.0 mg IV Versed; 100 mcg IV Fentanyl Total Moderate Sedation Time:  38 minutes The patient's level of consciousness and physiologic status were continuously monitored during the procedure by Radiology nursing. Additional Medications: 2 g IV Ancef. FLUOROSCOPY TIME:  1 minute and 24 seconds.  15.1 mGy. PROCEDURE: The procedure, risks, benefits, and alternatives were explained to the patient. Questions regarding the procedure were encouraged and answered. The patient understands and consents to the procedure. A time-out was performed prior to initiating the procedure. Ultrasound was utilized to confirm patency of the right internal jugular vein. The right neck and chest were prepped with chlorhexidine in a sterile fashion, and a sterile drape was applied covering the operative field. Maximum barrier sterile technique with sterile gowns and gloves were used for the procedure. Local anesthesia was provided with 1% lidocaine. After creating a small venotomy incision, a 21 gauge needle was advanced into the right internal jugular vein under direct, real-time ultrasound guidance. Ultrasound image documentation was performed. After securing guidewire access, an 8 Fr dilator was placed. A J-wire was kinked to measure appropriate catheter length. A subcutaneous port pocket was then created along the upper chest wall utilizing sharp and blunt dissection. Portable cautery was utilized. The pocket was irrigated with sterile saline. A single lumen power injectable port was chosen for placement. The 8 Fr catheter was tunneled from the port pocket site to the venotomy  incision. The port was placed in the pocket. External catheter was trimmed to appropriate length based on guidewire measurement. At the venotomy, an 8 Fr peel-away sheath was placed over a guidewire. The catheter was then placed through the sheath and the sheath removed. Final catheter positioning was confirmed and documented with a fluoroscopic spot image. The port was accessed with a needle and aspirated and flushed with heparinized saline. The access needle was removed. The venotomy and port pocket incisions were closed with subcutaneous 3-0 Monocryl and subcuticular 4-0 Vicryl. Dermabond was applied to both incisions. COMPLICATIONS: COMPLICATIONS None FINDINGS: After catheter placement, the tip lies at the cavo-atrial junction. The catheter aspirates normally and is ready for immediate use. IMPRESSION: Placement of single lumen port a cath via right internal jugular vein. The catheter tip lies at the cavo-atrial junction. A power injectable port a cath was placed and is ready for immediate use. Electronically Signed   By: Aletta Edouard M.D.   On: 06/15/2018 15:53  LOS: 1 day   Oren Binet, MD  [facility]  If 7PM-7AM, please contact night-coverage  Please page via [email or web address] [email or web address]-click on MD name and type text message  06/26/2018, 11:58 AM

## 2018-06-27 ENCOUNTER — Other Ambulatory Visit: Payer: Self-pay

## 2018-06-27 ENCOUNTER — Telehealth: Payer: Self-pay

## 2018-06-27 DIAGNOSIS — D61818 Other pancytopenia: Secondary | ICD-10-CM

## 2018-06-27 DIAGNOSIS — N390 Urinary tract infection, site not specified: Secondary | ICD-10-CM

## 2018-06-27 DIAGNOSIS — N179 Acute kidney failure, unspecified: Principal | ICD-10-CM

## 2018-06-27 LAB — CBC WITH DIFFERENTIAL/PLATELET
Abs Immature Granulocytes: 0.01 10*3/uL (ref 0.00–0.07)
Basophils Absolute: 0 10*3/uL (ref 0.0–0.1)
Basophils Relative: 1 %
EOS ABS: 0.2 10*3/uL (ref 0.0–0.5)
Eosinophils Relative: 4 %
HCT: 35.3 % — ABNORMAL LOW (ref 36.0–46.0)
Hemoglobin: 11.4 g/dL — ABNORMAL LOW (ref 12.0–15.0)
Immature Granulocytes: 0 %
Lymphocytes Relative: 64 %
Lymphs Abs: 2.3 10*3/uL (ref 0.7–4.0)
MCH: 32.7 pg (ref 26.0–34.0)
MCHC: 32.3 g/dL (ref 30.0–36.0)
MCV: 101.1 fL — ABNORMAL HIGH (ref 80.0–100.0)
Monocytes Absolute: 0.4 10*3/uL (ref 0.1–1.0)
Monocytes Relative: 10 %
Neutro Abs: 0.8 10*3/uL — ABNORMAL LOW (ref 1.7–7.7)
Neutrophils Relative %: 21 %
Platelets: 114 10*3/uL — ABNORMAL LOW (ref 150–400)
RBC: 3.49 MIL/uL — ABNORMAL LOW (ref 3.87–5.11)
RDW: 13.3 % (ref 11.5–15.5)
WBC: 3.6 10*3/uL — ABNORMAL LOW (ref 4.0–10.5)
nRBC: 0 % (ref 0.0–0.2)

## 2018-06-27 LAB — BASIC METABOLIC PANEL
Anion gap: 9 (ref 5–15)
BUN: 20 mg/dL (ref 8–23)
CALCIUM: 8.2 mg/dL — AB (ref 8.9–10.3)
CO2: 22 mmol/L (ref 22–32)
CREATININE: 0.75 mg/dL (ref 0.44–1.00)
Chloride: 106 mmol/L (ref 98–111)
GFR calc Af Amer: >60 mL/min (ref >60)
GFR calc non Af Amer: >60 mL/min (ref >60)
Glucose, Bld: 138 mg/dL — ABNORMAL HIGH (ref 70–99)
Potassium: 4.6 mmol/L (ref 3.5–5.1)
Sodium: 137 mmol/L (ref 135–145)

## 2018-06-27 LAB — GLUCOSE, CAPILLARY
Glucose-Capillary: 136 mg/dL — ABNORMAL HIGH (ref 70–99)
Glucose-Capillary: 211 mg/dL — ABNORMAL HIGH (ref 70–99)

## 2018-06-27 LAB — CBC
HEMATOCRIT: 38.6 % (ref 36.0–46.0)
Hemoglobin: 12.5 g/dL (ref 12.0–15.0)
MCH: 31.6 pg (ref 26.0–34.0)
MCHC: 32.4 g/dL (ref 30.0–36.0)
MCV: 97.5 fL (ref 80.0–100.0)
Platelets: 102 10*3/uL — ABNORMAL LOW (ref 150–400)
RBC: 3.96 MIL/uL (ref 3.87–5.11)
RDW: 13.3 % (ref 11.5–15.5)
WBC: 2.5 10*3/uL — ABNORMAL LOW (ref 4.0–10.5)
nRBC: 0 % (ref 0.0–0.2)

## 2018-06-27 LAB — UREA NITROGEN, URINE: Urea Nitrogen, Ur: 363 mg/dL

## 2018-06-27 MED ORDER — OMEPRAZOLE 20 MG PO CPDR: 20.0000 mg | DELAYED_RELEASE_CAPSULE | Freq: Every day | ORAL | 2 refills | Status: DC

## 2018-06-27 MED ORDER — OXYCODONE-ACETAMINOPHEN 5-325 MG PO TABS: 1.0000 | ORAL_TABLET | Freq: Four times a day (QID) | ORAL | 0 refills | Status: DC | PRN

## 2018-06-27 MED ORDER — HEPARIN SOD (PORK) LOCK FLUSH 100 UNIT/ML IV SOLN
500.0000 [IU] | INTRAVENOUS | Status: AC | PRN
  Administered 2018-06-27: 500 [IU]

## 2018-06-27 MED ORDER — ALTEPLASE 2 MG IJ SOLR
2.0000 mg | Freq: Once | INTRAMUSCULAR | Status: DC
  Filled 2018-06-27: qty 2

## 2018-06-27 MED ORDER — ACETAMINOPHEN 325 MG PO TABS: 650.0000 mg | ORAL_TABLET | Freq: Four times a day (QID) | ORAL | 0 refills | Status: DC | PRN

## 2018-06-27 MED ORDER — ATENOLOL 100 MG PO TABS: 100.0000 mg | ORAL_TABLET | Freq: Every day | ORAL | 1 refills | Status: DC

## 2018-06-27 MED ORDER — ONDANSETRON HCL 8 MG PO TABS: 8.0000 mg | ORAL_TABLET | Freq: Three times a day (TID) | ORAL | 1 refills | Status: DC | PRN

## 2018-06-27 MED ORDER — GLIPIZIDE 5 MG PO TABS: 2.5000 mg | ORAL_TABLET | Freq: Two times a day (BID) | ORAL | 4 refills | Status: DC

## 2018-06-27 MED ORDER — SENNOSIDES-DOCUSATE SODIUM 8.6-50 MG PO TABS: 2.0000 | ORAL_TABLET | Freq: Every day | ORAL | 1 refills | Status: DC
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
## 2018-06-27 NOTE — Discharge Instructions (Signed)
1)Avoid CELEBREX/ ibuprofen/Advil/Aleve/Motrin/Goody Powders/Naproxen/BC powders/Meloxicam/Diclofenac/Indomethacin and other Nonsteroidal anti-inflammatory medications as these will make you more likely to bleed and can cause stomach ulcers, can also cause Kidney problems.   2)Stop Lisinopril/HCTZ due to concerns about chemo-induced nausea and dehydration risk  3) may increase glipizide to 5 mg twice a day with meals while you are taking Decadron before your chemotherapy every 3 weeks  4) keep a diary of your blood pressure--- if blood pressure remains elevated your primary care physician may add amlodipine 2.5 mg daily to atenolol for better blood pressure control  5) keep a diary of blood sugar--- if blood sugar is persistently elevated primary care physician will increase your glipizide, will hold off on starting metformin at this time due to chemo-induced nausea and dehydration risk  6) please keep your appointment with Dr. Alvy Bimler your oncologist on Friday, 07/01/2018 you will need CBC and BMP blood test at that visit

## 2018-06-27 NOTE — Progress Notes (Signed)
IV Team member came and saw the patient and said the port was working fine. I was also instructed to return the Alteplase back to the pharmacy. I was also told to call in a consult to IV team before the patient was discharged. Will continue to follow up with IV team and patient.  Valli Glance, RN

## 2018-06-27 NOTE — Progress Notes (Signed)
Jordan Martinez   DOB:16-Apr-1951   GQ#:[id number]    Assessment & Plan:   Endometrial cancer She has received first dose of chemotherapy last week.  Continue supportive care. I will see her at the end of the week for further supportive care in my office.  Port malfunction Will order IV team to assess with [facility] before discharge  Acute renal failure, resolved She will continue aggressive fluid hydration therapy  Diabetes She will be started on metformin  Mild pancytopenia Due to recent chemotherapy.  Continue supportive care  Recurrent UTI Urine culture is pending  Discharge planning Hopefully, she can be discharged home today.  I will follow-up at the end of the week in my office. We will sign off.  Please call if questions arise  Heath Lark, MD 06/27/2018  7:55 AM   Subjective:  I was notified of her admission.  She received chemotherapy last week, complicated by severe joint pain, nausea, dehydration and diarrhea.  She was admitted to the hospital over the weekend with signs of acute renal failure, responded well with IV fluid hydration.  She felt much better.  Diarrhea has resolved.  She is able to tolerate solid food and is ready to be discharged.  Urine cultures are pending.  Objective:  Vitals:   06/26/18 2042 06/27/18 0420  BP: 118/67 130/69  Pulse: 96 68  Resp: 17 17  Temp: 99.4 F (37.4 C) 98.6 F (37 C)  SpO2: 93% 97%     Intake/Output Summary (Last 24 hours) at 06/27/2018 0755 Last data filed at 06/27/2018 2671 Gross per 24 hour  Intake 2076.38 ml  Output 2202 ml  Net -125.62 ml    GENERAL:alert, no distress and comfortable SKIN: skin color, texture, turgor are normal, no rashes or significant lesions EYES: normal, Conjunctiva are pink and non-injected, sclera clear OROPHARYNX:no exudate, no erythema and lips, buccal mucosa, and tongue normal  NECK: supple, thyroid normal size, non-tender, without nodularity LYMPH:  no palpable lymphadenopathy in  the cervical, axillary or inguinal LUNGS: clear to auscultation and percussion with normal breathing effort HEART: regular rate & rhythm and no murmurs and no lower extremity edema ABDOMEN:abdomen soft, non-tender and normal bowel sounds Musculoskeletal:no cyanosis of digits and no clubbing  NEURO: alert & oriented x 3 with fluent speech, no focal motor/sensory deficits   Labs:  Lab Results  Component Value Date   WBC 3.6 (L) 06/27/2018   HGB 11.4 (L) 06/27/2018   HCT 35.3 (L) 06/27/2018   MCV 101.1 (H) 06/27/2018   PLT 114 (L) 06/27/2018   NEUTROABS 0.8 (L) 06/27/2018    Lab Results  Component Value Date   NA 137 06/27/2018   K 4.6 06/27/2018   CL 106 06/27/2018   CO2 22 06/27/2018    Studies:  US Renal  Result Date: 18-Jul-2018 CLINICAL DATA:  Acute renal failure EXAM: RENAL / URINARY TRACT ULTRASOUND COMPLETE COMPARISON:  CT 04/27/2018 FINDINGS: Right Kidney: Renal measurements: 12.8 x 5.2 x 4.5 cm = volume: 156.2 mL . Echogenicity within normal limits. No mass or hydronephrosis visualized. Left Kidney: Renal measurements: 11.7 x 5.4 x 5.9 cm = volume: 195 mL. Echogenicity within normal limits. No mass or hydronephrosis visualized. Bladder: Appears normal for degree of bladder distention. IMPRESSION: No acute process or explanation for acute renal failure. Electronically Signed   By: Abigail Miyamoto M.D.   On: 07/18/18 22:29

## 2018-06-27 NOTE — Progress Notes (Signed)
Informed pt she had a PT eval. PT said she didn't understand why. Dr. Denton Brick was paged and he canceled the pt's PT order. Will continue with the ppt's discharge.  Valli Glance, RN

## 2018-06-27 NOTE — Progress Notes (Signed)
Pt has received discharge instructions and does not have any immediate questions or concerns. PT will be taken down stairs via wheelchair.  Valli Glance, RN

## 2018-06-27 NOTE — Progress Notes (Signed)
PT Cancellation Note  Patient Details Name: Jordan Martinez MRN: [id number] DOB: 1950/08/04   Cancelled Treatment:    Reason Eval/Treat Not Completed: Other (comment); PT eval cancelled per MD, RNs note state pt does not feel she needs it; PT signing off   [facility] 06/27/2018, 12:45 PM

## 2018-06-27 NOTE — Telephone Encounter (Signed)
She called and left a message to call her. Called back. She is upset that it is taking so long to unclog her porta-cath. The nurse is unable to perform and she is waiting on the IV team to come and care for the port. Above message given to Dr. Alvy Bimler.  Given appts for 12/27 and sent scheduling message to add flush appt after lab. Instructed to call for further problems.

## 2018-06-28 LAB — URINE CULTURE: Culture: >=100000 — AB

## 2018-06-30 ENCOUNTER — Telehealth: Payer: Self-pay | Admitting: Oncology

## 2018-06-30 NOTE — Telephone Encounter (Signed)
Ernie Hew and asked if she could come in at 67 tomorrow, 07/01/18, to see Irving Copas, [employer].  She said she would be able to come in at 12:30.  Apt scheduled by genetics.    She also mentioned that the hospitalist called her Saturday night and sent in a prescription for Keflex 500 mg TID for a UTI.

## 2018-07-01 ENCOUNTER — Inpatient Hospital Stay: Payer: Medicare Other

## 2018-07-01 ENCOUNTER — Encounter: Payer: Self-pay | Admitting: Hematology and Oncology

## 2018-07-01 ENCOUNTER — Encounter: Payer: Self-pay | Admitting: Oncology

## 2018-07-01 ENCOUNTER — Inpatient Hospital Stay (HOSPITAL_BASED_OUTPATIENT_CLINIC_OR_DEPARTMENT_OTHER): Payer: Medicare Other | Admitting: Genetic Counselor

## 2018-07-01 ENCOUNTER — Inpatient Hospital Stay (HOSPITAL_BASED_OUTPATIENT_CLINIC_OR_DEPARTMENT_OTHER): Payer: Medicare Other | Admitting: Hematology and Oncology

## 2018-07-01 VITALS — BP 160/77 | HR 79 | Temp 98.5°F | Resp 16 | Ht 64.0 in | Wt 197.6 lb

## 2018-07-01 DIAGNOSIS — K219 Gastro-esophageal reflux disease without esophagitis: Secondary | ICD-10-CM

## 2018-07-01 DIAGNOSIS — C541 Malignant neoplasm of endometrium: Secondary | ICD-10-CM | POA: Diagnosis present

## 2018-07-01 DIAGNOSIS — N179 Acute kidney failure, unspecified: Secondary | ICD-10-CM | POA: Diagnosis not present

## 2018-07-01 DIAGNOSIS — Z95828 Presence of other vascular implants and grafts: Secondary | ICD-10-CM | POA: Insufficient documentation

## 2018-07-01 DIAGNOSIS — Z7982 Long term (current) use of aspirin: Secondary | ICD-10-CM | POA: Diagnosis not present

## 2018-07-01 DIAGNOSIS — Z1379 Encounter for other screening for genetic and chromosomal anomalies: Secondary | ICD-10-CM

## 2018-07-01 DIAGNOSIS — Z87440 Personal history of urinary (tract) infections: Secondary | ICD-10-CM | POA: Diagnosis not present

## 2018-07-01 DIAGNOSIS — N3000 Acute cystitis without hematuria: Secondary | ICD-10-CM

## 2018-07-01 DIAGNOSIS — F17210 Nicotine dependence, cigarettes, uncomplicated: Secondary | ICD-10-CM

## 2018-07-01 DIAGNOSIS — E119 Type 2 diabetes mellitus without complications: Secondary | ICD-10-CM

## 2018-07-01 DIAGNOSIS — Z1509 Genetic susceptibility to other malignant neoplasm: Secondary | ICD-10-CM | POA: Diagnosis not present

## 2018-07-01 DIAGNOSIS — Z7984 Long term (current) use of oral hypoglycemic drugs: Secondary | ICD-10-CM | POA: Diagnosis not present

## 2018-07-01 DIAGNOSIS — R634 Abnormal weight loss: Secondary | ICD-10-CM

## 2018-07-01 DIAGNOSIS — I10 Essential (primary) hypertension: Secondary | ICD-10-CM | POA: Diagnosis not present

## 2018-07-01 DIAGNOSIS — M25562 Pain in left knee: Secondary | ICD-10-CM | POA: Diagnosis not present

## 2018-07-01 DIAGNOSIS — R0789 Other chest pain: Secondary | ICD-10-CM | POA: Diagnosis not present

## 2018-07-01 DIAGNOSIS — R531 Weakness: Secondary | ICD-10-CM

## 2018-07-01 DIAGNOSIS — G8929 Other chronic pain: Secondary | ICD-10-CM | POA: Diagnosis not present

## 2018-07-01 DIAGNOSIS — Z79899 Other long term (current) drug therapy: Secondary | ICD-10-CM

## 2018-07-01 DIAGNOSIS — Z800 Family history of malignant neoplasm of digestive organs: Secondary | ICD-10-CM | POA: Diagnosis not present

## 2018-07-01 DIAGNOSIS — M25561 Pain in right knee: Secondary | ICD-10-CM | POA: Diagnosis not present

## 2018-07-01 DIAGNOSIS — Z5111 Encounter for antineoplastic chemotherapy: Secondary | ICD-10-CM | POA: Diagnosis present

## 2018-07-01 DIAGNOSIS — M1990 Unspecified osteoarthritis, unspecified site: Secondary | ICD-10-CM | POA: Insufficient documentation

## 2018-07-01 MED ORDER — HYDRALAZINE HCL 25 MG PO TABS: 25.0000 mg | ORAL_TABLET | Freq: Two times a day (BID) | ORAL | 0 refills | Status: DC

## 2018-07-01 MED ORDER — SODIUM CHLORIDE 0.9% FLUSH
10.0000 mL | INTRAVENOUS | Status: DC | PRN
  Administered 2018-07-01: 10 mL
  Filled 2018-07-01: qty 10

## 2018-07-01 MED ORDER — METFORMIN HCL 500 MG PO TABS: 500.0000 mg | ORAL_TABLET | Freq: Every day | ORAL | 1 refills | Status: DC

## 2018-07-01 MED ORDER — HEPARIN SOD (PORK) LOCK FLUSH 100 UNIT/ML IV SOLN
500.0000 [IU] | Freq: Once | INTRAVENOUS | Status: AC | PRN
  Administered 2018-07-01: 500 [IU]
  Filled 2018-07-01: qty 5

## 2018-07-01 MED ORDER — OXYCODONE-ACETAMINOPHEN 5-325 MG PO TABS: 2.0000 | ORAL_TABLET | Freq: Four times a day (QID) | ORAL | 0 refills | Status: DC | PRN

## 2018-07-01 NOTE — Progress Notes (Signed)
REFERRING PROVIDER: Heath Lark, MD [address] [place], [place] [address]  PRIMARY PROVIDER:  Javier Glazier, MD  PRIMARY REASON FOR VISIT:  1. Genetic testing   2. MSH6-related Lynch syndrome (HNPCC5)     GENETIC TEST RESULTS   Patient Name: Jordan Martinez Patient Age: 67 y.o. Encounter Date: 07/01/2018  HPI: Jordan Martinez was previously seen in the [place] clinic due to a family of cancer and concerns regarding a hereditary predisposition to cancer. Please refer to our prior cancer genetics clinic note for more information regarding Jordan Martinez's medical, social and family histories, and our assessment and recommendations, at the time. Jordan Martinez recent genetic test results were disclosed to her, as were recommendations warranted by these results. These results and recommendations are discussed in more detail below.   FAMILY HISTORY:  We obtained a detailed, 4-generation family history.  Significant diagnoses are listed below: Family History  Adopted: Yes  Problem Relation Age of Onset  . Colon cancer Daughter 22       Lynch syndrome - MSH6 +  . Healthy Son     The patient is adopted and does not know her biological family, other than she was adopted through [employer] and is probably [place].    She has two children.  Her daughter was diagnosed with colon cancer at age 86 and testing revealed an MSH6 mutation.  Her son had a colonoscopy recently and no polyps were found.  Jordan Martinez is unaware of previous family history of genetic testing for hereditary cancer risks. Patient's maternal ancestors are of possible [place] descent, and paternal ancestors are of possible [place] descent. There is no reported Ashkenazi Jewish ancestry. There is no known consanguinity.  GENETIC TESTING:  At the time of Jordan Martinez's visit, we recommended she pursue genetic testing of the multi cancer panel. The genetic testing May 27, 2018 through the  multi-Cancer Panel offered by [facility] identified a single, heterozygous pathogenic gene mutation called MSH6, c.2731C>T confirming the diagnosis of Lynch syndrome.There were no deleterious mutations in  AIP, ALK, APC, ATM, AXIN2,BAP1,  BARD1, BLM, BMPR1A, BRCA1, BRCA2, BRIP1, CASR, CDC73, CDH1, CDK4, CDKN1B, CDKN1C, CDKN2A (p14ARF), CDKN2A (p16INK4a), CEBPA, CHEK2, CTNNA1, DICER1, DIS3L2, EGFR (c.2369C>T, p.Thr790Met variant only), EPCAM (Deletion/duplication testing only), FH, FLCN, GATA2, GPC3, GREM1 (Promoter region deletion/duplication testing only), HOXB13 (c.251G>A, p.Gly84Glu), HRAS, KIT, MAX, MEN1, MET, MITF (c.952G>A, p.Glu318Lys variant only), MLH1, MSH2, MSH3, MUTYH, NBN, NF1, NF2, NTHL1, PALB2, PDGFRA, PHOX2B, PMS2, POLD1, POLE, POT1, PRKAR1A, PTCH1, PTEN, RAD50, RAD51C, RAD51D, RB1, RECQL4, RET, RNF43, RUNX1, SDHAF2, SDHA (sequence changes only), SDHB, SDHC, SDHD, SMAD4, SMARCA4, SMARCB1, SMARCE1, STK11, SUFU, TERC, TERT, TMEM127, TP53, TSC1, TSC2, VHL, WRN and WT1.    A copy of the test report has been scanned into Epic for review.   Genetic testing did detect a Variant of Unknown Significance in the GATA2 gene called c.1232C>T.  At this time, it is unknown if this variant is associated with increased cancer risk or if this is a normal finding, but most variants such as this get reclassified to being inconsequential. It should not be used to make medical management decisions. With time, we suspect the lab will determine the significance of this variant, if any. If we do learn more about it, we will try to contact Jordan Martinez to discuss it further. However, it is important to stay in touch with [place] periodically and keep the address and phone number up to date.   SCREENING RECOMMENDATIONS: We discussed  the implications of Lynch syndrome for Jordan Martinez, and discussed who else in the family should have genetic testing. We recommended Jordan Martinez follow management guidelines for Lynch syndrome; all of which  are outlined below. These can be coordinated by Ms. Jenene Slicker GI doctor or her primary provider.   1. Annual colonoscopy.   2. While there is no clear evidence to support screening for stomach and small bowel cancer, an upper endoscopy can be considered at 3-5 year intervals beginning at age 41-35. However, whether to have this screening is best determined by the gastroenterologist.   3. Annual urinalysis.   For women with Lynch syndrome, unlike the effective surveillance plan for colorectal cancer risk, there is no professional agreement regarding management for the increased risk of uterine and ovarian cancer. For women with Lynch syndrome, unlike the effective surveillance plan for colorectal cancer risk, there is no professional agreement regarding management for the increased risk of uterine and ovarian cancer. For endometrial cancer, women are encouraged to be aware of and immediately report dysfunctional or post-menopausal bleeding, which should then be followed up by an endometrial biopsy. In terms of surveillance, transvaginal ultrasound and endometrial biopsies have not been shown to be effective screening tools; however, they may be considered at the clinician's discretion. Importantly, transvaginal ultrasound is not recommended in pre-menopausal women due to variable presentations throughout a normal menstrual cycle. Endometrial biopsy can be considered every 1-2 years, but does not have proven benefit of reducing mortality in women with Lynch syndrome given the typically early presenting symptoms. Finally, while hysterectomy has not been shown to reduce endometrial cancer mortality, it does reduce incidence, and therefore, can be considered as a risk-reducing option.    Unfortunately, symptoms of early stage ovarian cancer are not as obvious as endometrial cancer; however, women are encouraged to be aware of symptoms, such as pelvic or abdominal pain, bloating, increased abdominal girth,  difficulty eating, feeling full from eating quickly, as well as increased urinary frequency and urgency. In terms of surveillance, transvaginal ultrasound examination and serum CA-125 have not been shown to be sufficiently sensitive or specific to support for routine screening. In terms of risk-reducing surgery, historically, women have been recommended to undergo a bilateral salpingo-oophorectomy (BSO) regardless of gene mutation; however, with the collection of gene-specific data, this recommendation has evolved. Ms. Brinson has been diagnosed with endometrial cancer and has had a complete hysterectomy.      The most recent NCCN guidelines (v2.2019) notes the limited data to make specific recommendations for BSO in MSH6 and PMS2 mutation carriers. It is important to note that these guidelines do not cite the most recent article published with gene-specific risks (Dominguez-Valentin et al., 2019), which does quote a 3% ovarian cancer risk by age 63. Therefore, the decision to undergo a BSO in women without uterine/ovarian cancer, especially with this low-penetrance gene, should be individualized based on personal and family history.  However, we are available to help women and their providers establish an individualized surveillance plan. It is also important for women to understand the following:   1. Women should seek medical attention if they experience abnormal vaginal bleeding.  2. Some providers may still recommend vaginal ultrasounds, uterine biopsies (for uterine cancer risk) and/or CA-125 analysis ( for ovarian cancer risk), even though these have not been shown to be effective.  3. A hysterectomy with removal of the ovaries and fallopian tubes should be considered once childbearing is completed (if planned).  For pancreatic cancer screening, individuals with  a MLH1, MSH2/EPCAM, or MSH6 mutation and have a family history of pancreatic cancer, may consider annual MRI/MRCP or EUS screening for  pancreatic cancer.  Based on the most recent NCCN Guidelines for Hereditary Breast and Ovarian Cancer screening (where the pancreatic cancer screening recommendations are located), this should be offered individuals who have a first- or second-degree relative with pancreatic cancer and a known Lynch syndrome mutation. It does not make recommendations for individuals with these mutations, but are adopted.  We will refer Ms. Sperry to Dr. Truitt Merle, who is in charge of the pancreatic cancer screening program at [facility] to discuss this further.  FAMILY MEMBERS: Since we now know the mutation in Ms. Ackerley, we can test at-risk relatives to determine whether or not they have inherited the mutation and are at increased risk for cancer.  It is important that all of Ms. Golightly's relatives (both men and women) know of the presence of this gene mutation. Site-specific genetic testing can sort out who in the family is at risk and who is not. We will be happy to meet with any of the family members or refer them to a genetic counselor in their local area. To locate genetic counselors in other cities, individuals can visit the website of the [employer] ([employer]) and Secretary/administrator for a Social worker by zip code.   Ms. Deleon children have a 50% chance to have inherited this mutation.  Her daughter has had colon cancer and also has a MSH6 mutation.  Her son has not undergone genetic testing, but has had a colonoscopy.  We recommend he have genetic testing for this same mutation, as identifying the presence of this mutation would allow them to also take advantage of risk-reducing measures.   Children who inherit two mutations in the same Lynch gene, one mutation from each parent, are at-risk of a rare recessive condition called constitutional mismatch repair deficiency (CMMR-D) syndrome. If family members have this mutation, they may wish to have their partner tested if they are planning on having  children.  PLAN: Ms. Ruland will need to be followed as high risk based on her diagnosis of Lynch syndrome.    [Ms. Will does not have a gastroenterologist in [place] to perform follow up for the diagnosis of Lynch syndrome.  We have asked that she be seen at [place].  This note will be copied to that practice in order to set up the appropriate follow up.  Ms. Baltz does not have a gynecologist, however, she will not need gynecological screening based on her current diagnosis of uterine cancer and her hysterectomy.  We discussed that she will need a referral for breast cancer screening, as individuals with Lynch syndrome do have an increased risk for breast cancer.  She will talk with Dr. Alvy Bimler about referring her for her mammogram.  to perform follow up for the diagnosis of Lynch syndrome.    RESOURCES:  Ms. Haymaker was given patient information about Lynch syndrome that was written by the national support group "it takes guts".  she was also provided information about genetic counseling services in the [place], [place] area where her son lives so that he can undergo genetic testing.  We strongly encouraged Ms. Zimny to remain in contact with [place] in cancer genetics on an annual basis so we can update Ms. Herter personal and family histories, and inform her of advances in cancer genetics that may be of benefit for the entire family. Ms. Ola knows  she is also welcome to call with any questions or concerns, at any time.   Karen P. Florene Glen, [place], [facility]  Certified [employer]  [phone]

## 2018-07-01 NOTE — Assessment & Plan Note (Signed)
She has abnormal genetic testing.  She will meet with genetic counselor today for additional discussion and review of her recent abnormal genetic testing

## 2018-07-01 NOTE — Progress Notes (Signed)
[place] OFFICE PROGRESS NOTE  Patient Care Team: Javier Glazier, MD as PCP - General (Internal Medicine)  ASSESSMENT & PLAN:  Endometrial cancer [facility]) She has significant side effects from recent treatment and is recovering from it. Continue aggressive supportive care. In the future, I plan dose reduction and close follow-up to avoid recurrent hospitalization  Diabetes mellitus without complication ([place]) She has poorly controlled diabetes In addition to dietary modification, I recommend adding metformin 500 mg daily  Essential hypertension She has poorly controlled hypertension likely exacerbated by pain We discussed different management for blood pressure.  Her diuretic therapy and ACE inhibitor were discontinued due to recent acute renal failure She has documented amlodipine allergy For now, she will continue her beta-blocker In addition to pain management, I recommend starting her on hydralazine and will reassess blood pressure management next week  Arthritis She has significant exacerbation of arthritis pain since chemotherapy I have reviewed her outside records pertaining to her arthritis and review bone scan from [facility] I recommend aggressive pain management so that her blood pressure also is better controlled and she sleeps better I recommend increasing her Percocet in the short-term to use as needed to keep her pain under control I am concerned about deconditioning due to side effects of treatment and recommend physical therapy and rehab Her partner will inquired about local physical therapy and rehab facility and will contact us with information for referral I did offer her referral to a: Facility for cancer rehab but the patient declined  Urinary tract infection She was diagnosed with recurrent UTI and has completed another course of antibiotic therapy  Genetic testing She has abnormal genetic testing.  She will meet with genetic counselor today  for additional discussion and review of her recent abnormal genetic testing   Orders Placed This Encounter  Procedures  . CBC with Differential/Platelet    Standing Status:   Standing    Number of Occurrences:   22    Standing Expiration Date:   07/02/2019  . Comprehensive metabolic panel    Standing Status:   Standing    Number of Occurrences:   22    Standing Expiration Date:   07/02/2019    INTERVAL HISTORY: Please see below for problem oriented charting. She is seen as part of hospital follow-up. Since her chemotherapy recently, she developed significant complication with changes in bowel habits, recurrent UTI and acute renal failure and was hospitalized for few days Majority of her symptoms has improved She felt weak Her blood pressure and blood sugar control over the last few days have been suboptimal She is in a lot of pain with exacerbation of her chronic arthritis pain in her knees and new onset of pain in the right hip She does not sleep well due to pain Her appetite is stable but she have lost some weight Her bowel habits is regular She denies fever or chills  SUMMARY OF ONCOLOGIC HISTORY: Oncology History   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 Endometrium, biopsy - HIGH GRADE CARCINOMA, SEE COMMENT. 2. Cervix, biopsy - UNREMARKABLE ENDOCERVICAL GLANDULAR AND SQUAMOUS MUCOSA. - MULTIPLE DETACHED FRAGMENTS OF HIGH GRADE CARCINOMA, MORPHOLOGICALLY SIMILAR TO SPECIMEN 1. Microscopic Comment 1. 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  04/21/2018 Imaging    US pelvis Abnormally thickened endometrium measuring up to 16 mm. In the setting of post-menopausal bleeding, endometrial sampling is indicated to exclude  carcinoma. If results are benign, sonohysterogram should be considered for focal lesion work-up.     04/27/2018 Imaging    Ct abdomen and pelvis 1. Thickening of the endometrium identified compatible with endometrial carcinoma. 2. No findings to suggest metastatic adenopathy or distant metastatic disease. 3. Hiatal hernia 4.  Aortic Atherosclerosis (ICD10-I70.0).     05/02/2018 Tumor Marker    Patient's tumor was tested for the following markers: CA-125 Results of the tumor marker test revealed 65.3    05/03/2018 Pathology Results    1. Lymph node, sentinel, biopsy, right obturator - ONE OF ONE LYMPH NODES NEGATIVE FOR CARCINOMA (0/1). 2. Lymph node, sentinel, biopsy, left external iliac - FOUR OF FOUR LYMPH NODES NEGATIVE FOR CARCINOMA (0/4). 3. 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 TUMOR INVADES LESS THAN ONE HALF OF THE MYOMETRIUM. SEE ONCOLOGY TABLE. -SEROSA: UNREMARKABLE. NO MALIGNANCY. - CERVIX: BENIGN SQUAMOUS AND ENDOCERVICAL MUCOSA. NO DYSPLASIA OR MALIGNANCY. - BILATERAL OVARIES: INCLUSION CYSTS. NO MALIGNANCY. - BILATERAL FALLOPIAN TUBES: UNREMARKABLE. NO MALIGNANCY. Microscopic Comment 3. UTERUS, CARCINOMA OR CARCINOSARCOMA Procedure: Total hysterectomy with bilateral salpingo-oophorectomy. Right obturator and left external iliac lymph node biopsies. Histologic type: Endometrioid adenocarcinoma with squamous differentiation. Histologic Grade: FIGO grade I. Myometrial invasion: Depth of invasion: 3 mm Myometrial thickness: 12 mm Uterine Serosa Involvement: Not identified. Cervical stromal involvement: Not identified. Extent of involvement of other organs: Uninvolved. Lymphovascular invasion: Not identified. Regional Lymph Nodes: Examined: 5 Sentinel 0 Non-sentinel 5 Total Lymph nodes with  metastasis: 0 Isolated tumor cells (< 0.2 mm): 0 Micrometastasis: (> 0.2 mm and < 2.0 mm): 0 Macrometastasis: (> 2.0 mm): 0 Extracapsular extension: N/A. MMR / MSI testing: Will be ordered. Pathologic Stage Classification (pTNM, AJCC 8th edition): pT1a, pN0 FIGO Stage: IA Representative Tumor Block: 3D-G Comment: Pancytokeratin was performed on the lymph nodes and is negative    05/03/2018 Surgery    Operation: Robotic-assisted laparoscopic total hysterectomy with bilateral salpingoophorectomy, SLN biopsy   Surgeon: Donaciano Eva  Operative Findings:  : 6cm normal appearing uterus. Normal tubes and ovaries. Normal omentum and diaphragm. No suspicious nodes.      05/03/2018 Pathology Results    PERITONEAL WASHING(SPECIMEN 1 OF 1 COLLECTED 05/03/18): MALIGNANT CELLS CONSISTENT WITH METASTATIC ADENOCARCINOMA.    05/18/2018 Tumor Marker    Patient's tumor was tested for the following markers: CA-125 Results of the tumor marker test revealed 77.5    05/27/2018 Genetic Testing    MSH6 c.2731C>T pathogenic variant and GATA2 c.1232C>T VUS identified on the multicancer panel.  The Multi-Gene Panel offered by [facility] includes sequencing and/or deletion duplication testing of the following 85 genes: AIP, ALK, APC, ATM, AXIN2,BAP1,  BARD1, BLM, BMPR1A, BRCA1, BRCA2, BRIP1, CASR, CDC73, CDH1, CDK4, CDKN1B, CDKN1C, CDKN2A (p14ARF), CDKN2A (p16INK4a), CEBPA, CHEK2, CTNNA1, DICER1, DIS3L2, EGFR (c.2369C>T, p.Thr790Met variant only), EPCAM (Deletion/duplication testing only), FH, FLCN, GATA2, GPC3, GREM1 (Promoter region deletion/duplication testing only), HOXB13 (c.251G>A, p.Gly84Glu), HRAS, KIT, MAX, MEN1, MET, MITF (c.952G>A, p.Glu318Lys variant only), MLH1, MSH2, MSH3, MSH6, MUTYH, NBN, NF1, NF2, NTHL1, PALB2, PDGFRA, PHOX2B, PMS2, POLD1, POLE, POT1, PRKAR1A, PTCH1, PTEN, RAD50, RAD51C, RAD51D, RB1, RECQL4, RET, RNF43, RUNX1, SDHAF2, SDHA (sequence changes only), SDHB, SDHC, SDHD, SMAD4,  SMARCA4, SMARCB1, SMARCE1, STK11, SUFU, TERC, TERT, TMEM127, TP53, TSC1,  TSC2, VHL, WRN and WT1.  The report date is 05/27/2018.    06/09/2018 Cancer Staging    Staging form: Corpus Uteri - Carcinoma and Carcinosarcoma, AJCC 8th Edition - Pathologic: Stage I (pT1, pN0, cM0) - Signed by Heath Lark, MD on 06/09/2018    06/15/2018 Procedure    Placement of single lumen port a cath via right internal jugular vein. The catheter tip lies at the cavo-atrial junction. A power injectable port a cath was placed and is ready for immediate use.     06/20/2018 -  Chemotherapy    The patient had carboplatin and Taxol    06/25/2018 - 06/27/2018 Hospital Admission    She was admitted to the hospital with deydration     REVIEW OF SYSTEMS:   Constitutional: Denies fevers, chills Eyes: Denies blurriness of vision Ears, nose, mouth, throat, and face: Denies mucositis or sore throat Respiratory: Denies cough, dyspnea or wheezes Cardiovascular: Denies palpitation, chest discomfort or lower extremity swelling Gastrointestinal:  Denies nausea, heartburn or change in bowel habits Skin: Denies abnormal skin rashes Lymphatics: Denies new lymphadenopathy or easy bruising Behavioral/Psych: Mood is stable, no new changes  All other systems were reviewed with the patient and are negative.  I have reviewed the past medical history, past surgical history, social history and family history with the patient and they are unchanged from previous note.  ALLERGIES:  is allergic to amlodipine; compazine [prochlorperazine edisylate]; gabapentin; lyrica [pregabalin]; and sertraline.  MEDICATIONS:  Current Outpatient Medications  Medication Sig Dispense Refill  . acetaminophen (TYLENOL) 325 MG tablet Take 2 tablets (650 mg total) by mouth every 6 (six) hours as needed for mild pain, fever or headache (or Fever >/= 101). 15 tablet 0  . aspirin EC 81 MG tablet Take 81 mg by mouth daily.    Marland Kitchen atenolol (TENORMIN) 100 MG  tablet Take 1 tablet (100 mg total) by mouth at bedtime. For BP 30 tablet 1  . atorvastatin (LIPITOR) 20 MG tablet Take 20 mg by mouth at bedtime.   0  . cholecalciferol (VITAMIN D) 400 units TABS tablet Take 400 Units by mouth daily.    . cyclobenzaprine (FLEXERIL) 5 MG tablet Take 5 mg by mouth 3 (three) times daily.     Marland Kitchen dexamethasone (DECADRON) 4 MG tablet Take 3 tabs at the night before and 3 tab the morning of chemotherapy, every 3 weeks, by mouth 36 tablet 0  . glipiZIDE (GLUCOTROL) 5 MG tablet Take 0.5 tablets (2.5 mg total) by mouth 2 (two) times daily before a meal. Do not take if you Skip a meal 60 tablet 4  . hydrALAZINE (APRESOLINE) 25 MG tablet Take 1 tablet (25 mg total) by mouth 2 (two) times daily. 14 tablet 0  . lidocaine-prilocaine (EMLA) cream Apply to affected area once 30 g 3  . metFORMIN (GLUCOPHAGE) 500 MG tablet Take 1 tablet (500 mg total) by mouth daily with breakfast. 30 tablet 1  . Omega-3 Fatty Acids (FISH OIL) 1200 MG CAPS Take 1,200 mg by mouth daily.     Marland Kitchen omeprazole (PRILOSEC) 20 MG capsule Take 1 capsule (20 mg total) by mouth daily. 30 capsule 2  . ondansetron (ZOFRAN) 8 MG tablet Take 1 tablet (8 mg total) by mouth every 8 (eight) hours as needed for refractory nausea / vomiting. Start on day 3 after chemo. 15 tablet 1  . oxyCODONE-acetaminophen (PERCOCET/ROXICET) 5-325 MG tablet Take 2 tablets by mouth every 6 (six) hours as needed for moderate pain or severe  pain. 60 tablet 0  . promethazine (PHENERGAN) 25 MG tablet Take 1 tablet (25 mg total) by mouth every 6 (six) hours as needed for nausea or vomiting. 30 tablet 0  . Propylene Glycol (SYSTANE BALANCE) 0.6 % SOLN Place 1 drop into both eyes every morning.    . senna-docusate (SENOKOT-S) 8.6-50 MG tablet Take 2 tablets by mouth at bedtime. 60 tablet 1   No current facility-administered medications for this visit.     PHYSICAL EXAMINATION: ECOG PERFORMANCE STATUS: 1 - Symptomatic but completely  ambulatory  Vitals:   07/01/18 1344  BP: (!) 160/77  Pulse: 79  Resp: 16  Temp: 98.5 F (36.9 C)  SpO2: 97%   Filed Weights   07/01/18 1344  Weight: 197 lb 9.6 oz (89.6 kg)    GENERAL:alert, no distress and comfortable SKIN: skin color, texture, turgor are normal, no rashes or significant lesions EYES: normal, Conjunctiva are pink and non-injected, sclera clear OROPHARYNX:no exudate, no erythema and lips, buccal mucosa, and tongue normal  NECK: supple, thyroid normal size, non-tender, without nodularity LYMPH:  no palpable lymphadenopathy in the cervical, axillary or inguinal LUNGS: clear to auscultation and percussion with normal breathing effort HEART: regular rate & rhythm and no murmurs and no lower extremity edema ABDOMEN:abdomen soft, non-tender and normal bowel sounds Musculoskeletal:no cyanosis of digits and no clubbing  NEURO: alert & oriented x 3 with fluent speech, no focal motor/sensory deficits  LABORATORY DATA:  I have reviewed the data as listed    Component Value Date/Time   NA 137 06/27/2018 0427   K 4.6 06/27/2018 0427   CL 106 06/27/2018 0427   CO2 22 06/27/2018 0427   GLUCOSE 138 (H) 06/27/2018 0427   BUN 20 06/27/2018 0427   CREATININE 0.75 06/27/2018 0427   CALCIUM 8.2 (L) 06/27/2018 0427   PROT 7.1 06/25/2018 1617   ALBUMIN 4.1 06/25/2018 1617   AST 26 06/25/2018 1617   ALT 41 06/25/2018 1617   ALKPHOS 105 06/25/2018 1617   BILITOT 1.7 (H) 06/25/2018 1617   GFRNONAA >60 06/27/2018 0427   GFRAA >60 06/27/2018 0427    No results found for: SPEP, UPEP  Lab Results  Component Value Date   WBC 3.6 (L) 06/27/2018   NEUTROABS 0.8 (L) 06/27/2018   HGB 11.4 (L) 06/27/2018   HCT 35.3 (L) 06/27/2018   MCV 101.1 (H) 06/27/2018   PLT 114 (L) 06/27/2018      Chemistry      Component Value Date/Time   NA 137 06/27/2018 0427   K 4.6 06/27/2018 0427   CL 106 06/27/2018 0427   CO2 22 06/27/2018 0427   BUN 20 06/27/2018 0427   CREATININE 0.75  06/27/2018 0427      Component Value Date/Time   CALCIUM 8.2 (L) 06/27/2018 0427   ALKPHOS 105 06/25/2018 1617   AST 26 06/25/2018 1617   ALT 41 06/25/2018 1617   BILITOT 1.7 (H) 06/25/2018 1617       RADIOGRAPHIC STUDIES: I have personally reviewed the radiological images as listed and agreed with the findings in the report. US Renal  Result Date: 06/25/2018 CLINICAL DATA:  Acute renal failure EXAM: RENAL / URINARY TRACT ULTRASOUND COMPLETE COMPARISON:  CT 04/27/2018 FINDINGS: Right Kidney: Renal measurements: 12.8 x 5.2 x 4.5 cm = volume: 156.2 mL . Echogenicity within normal limits. No mass or hydronephrosis visualized. Left Kidney: Renal measurements: 11.7 x 5.4 x 5.9 cm = volume: 195 mL. Echogenicity within normal limits. No mass or hydronephrosis visualized.  Bladder: Appears normal for degree of bladder distention. IMPRESSION: No acute process or explanation for acute renal failure. Electronically Signed   By: Abigail Miyamoto M.D.   On: 06/25/2018 22:29   Ir Imaging Guided Port Insertion  Result Date: 06/15/2018 CLINICAL DATA:  Endometrial carcinoma and need for porta cath for chemotherapy. EXAM: IMPLANTED PORT A CATH PLACEMENT WITH ULTRASOUND AND FLUOROSCOPIC GUIDANCE ANESTHESIA/SEDATION: 4.0 mg IV Versed; 100 mcg IV Fentanyl Total Moderate Sedation Time:  38 minutes The patient's level of consciousness and physiologic status were continuously monitored during the procedure by Radiology nursing. Additional Medications: 2 g IV Ancef. FLUOROSCOPY TIME:  1 minute and 24 seconds.  15.1 mGy. PROCEDURE: The procedure, risks, benefits, and alternatives were explained to the patient. Questions regarding the procedure were encouraged and answered. The patient understands and consents to the procedure. A time-out was performed prior to initiating the procedure. Ultrasound was utilized to confirm patency of the right internal jugular vein. The right neck and chest were prepped with chlorhexidine in a  sterile fashion, and a sterile drape was applied covering the operative field. Maximum barrier sterile technique with sterile gowns and gloves were used for the procedure. Local anesthesia was provided with 1% lidocaine. After creating a small venotomy incision, a 21 gauge needle was advanced into the right internal jugular vein under direct, real-time ultrasound guidance. Ultrasound image documentation was performed. After securing guidewire access, an 8 Fr dilator was placed. A J-wire was kinked to measure appropriate catheter length. A subcutaneous port pocket was then created along the upper chest wall utilizing sharp and blunt dissection. Portable cautery was utilized. The pocket was irrigated with sterile saline. A single lumen power injectable port was chosen for placement. The 8 Fr catheter was tunneled from the port pocket site to the venotomy incision. The port was placed in the pocket. External catheter was trimmed to appropriate length based on guidewire measurement. At the venotomy, an 8 Fr peel-away sheath was placed over a guidewire. The catheter was then placed through the sheath and the sheath removed. Final catheter positioning was confirmed and documented with a fluoroscopic spot image. The port was accessed with a needle and aspirated and flushed with heparinized saline. The access needle was removed. The venotomy and port pocket incisions were closed with subcutaneous 3-0 Monocryl and subcuticular 4-0 Vicryl. Dermabond was applied to both incisions. COMPLICATIONS: COMPLICATIONS None FINDINGS: After catheter placement, the tip lies at the cavo-atrial junction. The catheter aspirates normally and is ready for immediate use. IMPRESSION: Placement of single lumen port a cath via right internal jugular vein. The catheter tip lies at the cavo-atrial junction. A power injectable port a cath was placed and is ready for immediate use. Electronically Signed   By: Aletta Edouard M.D.   On: 06/15/2018  15:53    All questions were answered. The patient knows to call the clinic with any problems, questions or concerns. No barriers to learning was detected.  I spent 30 minutes counseling the patient face to face. The total time spent in the appointment was 40 minutes and more than 50% was on counseling and review of test results  Heath Lark, MD 07/01/2018 3:27 PM

## 2018-07-01 NOTE — Assessment & Plan Note (Addendum)
She has significant side effects from recent treatment and is recovering from it. Continue aggressive supportive care. In the future, I plan dose reduction and close follow-up to avoid recurrent hospitalization

## 2018-07-01 NOTE — Assessment & Plan Note (Addendum)
She has poorly controlled hypertension likely exacerbated by pain We discussed different management for blood pressure.  Her diuretic therapy and ACE inhibitor were discontinued due to recent acute renal failure She has documented amlodipine allergy For now, she will continue her beta-blocker In addition to pain management, I recommend starting her on hydralazine and will reassess blood pressure management next week

## 2018-07-01 NOTE — Assessment & Plan Note (Signed)
She has poorly controlled diabetes In addition to dietary modification, I recommend adding metformin 500 mg daily

## 2018-07-01 NOTE — Assessment & Plan Note (Signed)
She has significant exacerbation of arthritis pain since chemotherapy I have reviewed her outside records pertaining to her arthritis and review bone scan from [facility] I recommend aggressive pain management so that her blood pressure also is better controlled and she sleeps better I recommend increasing her Percocet in the short-term to use as needed to keep her pain under control I am concerned about deconditioning due to side effects of treatment and recommend physical therapy and rehab Her partner will inquired about local physical therapy and rehab facility and will contact us with information for referral I did offer her referral to a: Facility for cancer rehab but the patient declined

## 2018-07-01 NOTE — Assessment & Plan Note (Signed)
She was diagnosed with recurrent UTI and has completed another course of antibiotic therapy

## 2018-07-04 ENCOUNTER — Telehealth: Payer: Self-pay | Admitting: Hematology and Oncology

## 2018-07-04 NOTE — Telephone Encounter (Signed)
Per 12/27 no los 

## 2018-07-05 ENCOUNTER — Telehealth: Payer: Self-pay

## 2018-07-05 NOTE — Telephone Encounter (Signed)
Appt made for 08/10/18 at 1115 am with Dr Ardis Hughs.  Letter mailed

## 2018-07-05 NOTE — Telephone Encounter (Signed)
-----  Message from Daniel P Jacobs, MD sent at 07/05/2018  8:22 AM EST ----- Karen, Thanks. We'll contact her for a visit in the office to coordinate. She may need EGD at some point as well.   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    Dr. Gorsuch - she has asked if you could make a referral for her for a mammogram (she does not have a GYN MD, her previous GYN used to order it for her, and since she now sees you 'regularly' she would like you to order this if you can.  Dr. Jacobs - this patient recently moved from either [place] or [place], and had her last colonoscopy in January 2018.  She is going through Chemo and is having issues. She would like to be set up with a colonoscopy with your group in April/May time frame after she finishes her colonoscopy.  Dr. Feng - Patient is adopted and does not know any family history.  Does not know if there is a FH of pancreatic cancer.  She would like to talk with you about pancreatic cancer screening, especially in light of the recent guidelines for PC screening with Lynch syndrome.  Andrea - I am cc'ing you on this so that you can set patient up in the pancreatic cancer screening clinic.   

## 2018-07-07 ENCOUNTER — Encounter: Payer: Self-pay | Admitting: Hematology and Oncology

## 2018-07-07 ENCOUNTER — Encounter: Payer: Self-pay | Admitting: Oncology

## 2018-07-07 NOTE — Progress Notes (Signed)
Called pt to introduce myself as her Arboriculturist.  Pt has 2 insurances so copay assistance shouldn't be needed.  I offered the [employer], went over what it covers and gave her the income requirement.  She stated she exceeds the requirement so she doesn't qualify for the grant at this time.  I will give her my card on 07/08/18 for any questions or concerns she may have in the future.

## 2018-07-08 ENCOUNTER — Encounter: Payer: Self-pay | Admitting: Hematology and Oncology

## 2018-07-08 ENCOUNTER — Encounter: Payer: Self-pay | Admitting: Oncology

## 2018-07-08 ENCOUNTER — Inpatient Hospital Stay: Payer: Medicare Other | Attending: Obstetrics

## 2018-07-08 ENCOUNTER — Inpatient Hospital Stay (HOSPITAL_BASED_OUTPATIENT_CLINIC_OR_DEPARTMENT_OTHER): Payer: Medicare Other | Admitting: Hematology and Oncology

## 2018-07-08 ENCOUNTER — Inpatient Hospital Stay: Payer: Medicare Other

## 2018-07-08 ENCOUNTER — Telehealth: Payer: Self-pay | Admitting: Hematology and Oncology

## 2018-07-08 DIAGNOSIS — Z800 Family history of malignant neoplasm of digestive organs: Secondary | ICD-10-CM | POA: Diagnosis not present

## 2018-07-08 DIAGNOSIS — Z7982 Long term (current) use of aspirin: Secondary | ICD-10-CM | POA: Insufficient documentation

## 2018-07-08 DIAGNOSIS — R112 Nausea with vomiting, unspecified: Secondary | ICD-10-CM | POA: Diagnosis not present

## 2018-07-08 DIAGNOSIS — Z8542 Personal history of malignant neoplasm of other parts of uterus: Secondary | ICD-10-CM | POA: Diagnosis not present

## 2018-07-08 DIAGNOSIS — F17210 Nicotine dependence, cigarettes, uncomplicated: Secondary | ICD-10-CM | POA: Diagnosis not present

## 2018-07-08 DIAGNOSIS — R109 Unspecified abdominal pain: Secondary | ICD-10-CM | POA: Insufficient documentation

## 2018-07-08 DIAGNOSIS — R945 Abnormal results of liver function studies: Secondary | ICD-10-CM | POA: Insufficient documentation

## 2018-07-08 DIAGNOSIS — C541 Malignant neoplasm of endometrium: Secondary | ICD-10-CM | POA: Diagnosis present

## 2018-07-08 DIAGNOSIS — M1990 Unspecified osteoarthritis, unspecified site: Secondary | ICD-10-CM

## 2018-07-08 DIAGNOSIS — I10 Essential (primary) hypertension: Secondary | ICD-10-CM | POA: Diagnosis not present

## 2018-07-08 DIAGNOSIS — Z5111 Encounter for antineoplastic chemotherapy: Secondary | ICD-10-CM | POA: Diagnosis present

## 2018-07-08 DIAGNOSIS — E1165 Type 2 diabetes mellitus with hyperglycemia: Secondary | ICD-10-CM | POA: Diagnosis not present

## 2018-07-08 DIAGNOSIS — N3289 Other specified disorders of bladder: Secondary | ICD-10-CM | POA: Diagnosis not present

## 2018-07-08 DIAGNOSIS — E860 Dehydration: Secondary | ICD-10-CM | POA: Insufficient documentation

## 2018-07-08 DIAGNOSIS — R5381 Other malaise: Secondary | ICD-10-CM | POA: Insufficient documentation

## 2018-07-08 DIAGNOSIS — E119 Type 2 diabetes mellitus without complications: Secondary | ICD-10-CM | POA: Diagnosis not present

## 2018-07-08 DIAGNOSIS — Z79899 Other long term (current) drug therapy: Secondary | ICD-10-CM | POA: Diagnosis not present

## 2018-07-08 DIAGNOSIS — K5909 Other constipation: Secondary | ICD-10-CM | POA: Insufficient documentation

## 2018-07-08 DIAGNOSIS — R21 Rash and other nonspecific skin eruption: Secondary | ICD-10-CM | POA: Diagnosis not present

## 2018-07-08 DIAGNOSIS — R5383 Other fatigue: Secondary | ICD-10-CM | POA: Diagnosis not present

## 2018-07-08 DIAGNOSIS — Z7984 Long term (current) use of oral hypoglycemic drugs: Secondary | ICD-10-CM | POA: Insufficient documentation

## 2018-07-08 DIAGNOSIS — Z95828 Presence of other vascular implants and grafts: Secondary | ICD-10-CM

## 2018-07-08 LAB — COMPREHENSIVE METABOLIC PANEL
ALT: 52 U/L — ABNORMAL HIGH (ref 0–44)
AST: 36 U/L (ref 15–41)
Albumin: 3.2 g/dL — ABNORMAL LOW (ref 3.5–5.0)
Alkaline Phosphatase: 189 U/L — ABNORMAL HIGH (ref 38–126)
Anion gap: 9 (ref 5–15)
BUN: 16 mg/dL (ref 8–23)
CO2: 25 mmol/L (ref 22–32)
Calcium: 9 mg/dL (ref 8.9–10.3)
Chloride: 108 mmol/L (ref 98–111)
Creatinine, Ser: 0.73 mg/dL (ref 0.44–1.00)
GFR calc non Af Amer: >60 mL/min (ref >60)
Glucose, Bld: 134 mg/dL — ABNORMAL HIGH (ref 70–99)
Potassium: 4.1 mmol/L (ref 3.5–5.1)
Sodium: 142 mmol/L (ref 135–145)
Total Bilirubin: 0.3 mg/dL (ref 0.3–1.2)
Total Protein: 6.3 g/dL — ABNORMAL LOW (ref 6.5–8.1)

## 2018-07-08 LAB — CBC WITH DIFFERENTIAL/PLATELET
Abs Immature Granulocytes: 0.03 10*3/uL (ref 0.00–0.07)
BASOS ABS: 0.1 10*3/uL (ref 0.0–0.1)
Basophils Relative: 1 %
EOS PCT: 1 %
Eosinophils Absolute: 0.1 10*3/uL (ref 0.0–0.5)
HEMATOCRIT: 37.1 % (ref 36.0–46.0)
Hemoglobin: 12.2 g/dL (ref 12.0–15.0)
Immature Granulocytes: 0 %
Lymphocytes Relative: 28 %
Lymphs Abs: 2.5 10*3/uL (ref 0.7–4.0)
MCH: 32.3 pg (ref 26.0–34.0)
MCHC: 32.9 g/dL (ref 30.0–36.0)
MCV: 98.1 fL (ref 80.0–100.0)
Monocytes Absolute: 0.8 10*3/uL (ref 0.1–1.0)
Monocytes Relative: 9 %
Neutro Abs: 5.6 10*3/uL (ref 1.7–7.7)
Neutrophils Relative %: 61 %
Platelets: 298 10*3/uL (ref 150–400)
RBC: 3.78 MIL/uL — ABNORMAL LOW (ref 3.87–5.11)
RDW: 14.2 % (ref 11.5–15.5)
WBC: 9.1 10*3/uL (ref 4.0–10.5)
nRBC: 0 % (ref 0.0–0.2)

## 2018-07-08 MED ORDER — SODIUM CHLORIDE 0.9% FLUSH
10.0000 mL | INTRAVENOUS | Status: DC | PRN
  Administered 2018-07-08: 10 mL
  Filled 2018-07-08: qty 10

## 2018-07-08 MED ORDER — DEXAMETHASONE 4 MG PO TABS: ORAL_TABLET | ORAL | 0 refills | Status: DC

## 2018-07-08 MED ORDER — HYDRALAZINE HCL 25 MG PO TABS: 25.0000 mg | ORAL_TABLET | Freq: Three times a day (TID) | ORAL | 0 refills | Status: DC

## 2018-07-08 MED ORDER — HEPARIN SOD (PORK) LOCK FLUSH 100 UNIT/ML IV SOLN
500.0000 [IU] | Freq: Once | INTRAVENOUS | Status: AC | PRN
  Administered 2018-07-08: 500 [IU]
  Filled 2018-07-08: qty 5

## 2018-07-08 NOTE — Assessment & Plan Note (Signed)
The patient is profoundly debilitated due to recent surgery and recurrent hospitalization and complications from her chemotherapy I recommend physical therapy evaluation and rehab I will send a referral.

## 2018-07-08 NOTE — Assessment & Plan Note (Addendum)
She tolerated cycle 1 of chemotherapy poorly, complicated by diarrhea, nausea, dehydration and admission to the hospital I will plan to reduce her oral premedication as well as IV premedication dexamethasone To avoid nausea, she will also continue oral dexamethasone 4 mg daily starting on day 3 for 2 days I plan to reduce carboplatin to AUC of 5 I plan to see her end of next week for further follow-up and symptom management.

## 2018-07-08 NOTE — Progress Notes (Signed)
[place] OFFICE PROGRESS NOTE  Patient Care Team: Javier Glazier, MD as PCP - General (Internal Medicine)  ASSESSMENT & PLAN:  Endometrial cancer [facility]) She tolerated cycle 1 of chemotherapy poorly, complicated by diarrhea, nausea, dehydration and admission to the hospital I will plan to reduce her oral premedication as well as IV premedication dexamethasone To avoid nausea, she will also continue oral dexamethasone 4 mg daily starting on day 3 for 2 days I plan to reduce carboplatin to AUC of 5 I plan to see her end of next week for further follow-up and symptom management.   Diabetes mellitus without complication ([place]) We discussed aggressive dietary management and reducing dexamethasone premedication  Essential hypertension The patient has suboptimal blood pressure control.  We discussed increasing hydralazine to 25 mg 3 times a day  Arthritis She has recent flare of arthritis pain requiring frequent intake of pain medicine. She will continue the same We discussed narcotic refill policy  Physical debility The patient is profoundly debilitated due to recent surgery and recurrent hospitalization and complications from her chemotherapy I recommend physical therapy evaluation and rehab I will send a referral.   No orders of the defined types were placed in this encounter.   INTERVAL HISTORY: Please see below for problem oriented charting. She returns to be seen prior to cycle 2 of chemotherapy. She felt better since last time I saw her.  Her bowel movement regular. She denies urinary tract infection symptoms. No recent nausea. She brought with her documentation of her blood sugar and blood pressure over the past week that appears elevated despite the additional hydralazine. The highest documented systolic blood pressure was 035 and diastolic blood pressure was over 100 She denies peripheral neuropathy. Denies fever or chills.  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 Endometrium, biopsy - HIGH GRADE CARCINOMA, SEE COMMENT. 2. Cervix, biopsy - UNREMARKABLE ENDOCERVICAL GLANDULAR AND SQUAMOUS MUCOSA. - MULTIPLE DETACHED FRAGMENTS OF HIGH GRADE CARCINOMA, MORPHOLOGICALLY SIMILAR TO SPECIMEN 1. Microscopic Comment 1. 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    04/21/2018 Imaging    US pelvis Abnormally thickened endometrium measuring up to 16 mm. In the setting of post-menopausal bleeding, endometrial sampling is indicated to exclude carcinoma. If results are benign, sonohysterogram should be considered for focal lesion work-up.     04/27/2018 Imaging    Ct abdomen and pelvis 1. Thickening of the endometrium identified compatible with endometrial carcinoma. 2. No findings to suggest metastatic adenopathy or distant metastatic disease. 3. Hiatal hernia 4.  Aortic Atherosclerosis (ICD10-I70.0).     05/02/2018 Tumor Marker    Patient's tumor was tested for the following markers: CA-125 Results of the tumor marker test revealed 65.3    05/03/2018 Pathology Results    1. Lymph node, sentinel, biopsy, right obturator - ONE OF ONE LYMPH NODES NEGATIVE FOR CARCINOMA (0/1). 2. Lymph node, sentinel, biopsy, left external iliac - FOUR OF FOUR LYMPH NODES NEGATIVE FOR CARCINOMA (0/4). 3. 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 TUMOR INVADES LESS THAN ONE HALF OF THE MYOMETRIUM. SEE ONCOLOGY TABLE. -SEROSA: UNREMARKABLE. NO MALIGNANCY. - CERVIX:  BENIGN SQUAMOUS AND ENDOCERVICAL  MUCOSA. NO DYSPLASIA OR MALIGNANCY. - BILATERAL OVARIES: INCLUSION CYSTS. NO MALIGNANCY. - BILATERAL FALLOPIAN TUBES: UNREMARKABLE. NO MALIGNANCY. Microscopic Comment 3. UTERUS, CARCINOMA OR CARCINOSARCOMA Procedure: Total hysterectomy with bilateral salpingo-oophorectomy. Right obturator and left external iliac lymph node biopsies. Histologic type: Endometrioid adenocarcinoma with squamous differentiation. Histologic Grade: FIGO grade I. Myometrial invasion: Depth of invasion: 3 mm Myometrial thickness: 12 mm Uterine Serosa Involvement: Not identified. Cervical stromal involvement: Not identified. Extent of involvement of other organs: Uninvolved. Lymphovascular invasion: Not identified. Regional Lymph Nodes: Examined: 5 Sentinel 0 Non-sentinel 5 Total Lymph nodes with metastasis: 0 Isolated tumor cells (< 0.2 mm): 0 Micrometastasis: (> 0.2 mm and < 2.0 mm): 0 Macrometastasis: (> 2.0 mm): 0 Extracapsular extension: N/A. MMR / MSI testing: Will be ordered. Pathologic Stage Classification (pTNM, AJCC 8th edition): pT1a, pN0 FIGO Stage: IA Representative Tumor Block: 3D-G Comment: Pancytokeratin was performed on the lymph nodes and is negative    05/03/2018 Surgery    Operation: Robotic-assisted laparoscopic total hysterectomy with bilateral salpingoophorectomy, SLN biopsy   Surgeon: Donaciano Eva  Operative Findings:  : 6cm normal appearing uterus. Normal tubes and ovaries. Normal omentum and diaphragm. No suspicious nodes.      05/03/2018 Pathology Results    PERITONEAL WASHING(SPECIMEN 1 OF 1 COLLECTED 05/03/18): MALIGNANT CELLS CONSISTENT WITH METASTATIC ADENOCARCINOMA.    05/18/2018 Tumor Marker    Patient's tumor was tested for the following markers: CA-125 Results of the tumor marker test revealed 77.5    05/27/2018 Genetic Testing    MSH6 c.2731C>T pathogenic variant and GATA2 c.1232C>T VUS identified on the multicancer panel.  The Multi-Gene Panel  offered by [facility] includes sequencing and/or deletion duplication testing of the following 85 genes: AIP, ALK, APC, ATM, AXIN2,BAP1,  BARD1, BLM, BMPR1A, BRCA1, BRCA2, BRIP1, CASR, CDC73, CDH1, CDK4, CDKN1B, CDKN1C, CDKN2A (p14ARF), CDKN2A (p16INK4a), CEBPA, CHEK2, CTNNA1, DICER1, DIS3L2, EGFR (c.2369C>T, p.Thr790Met variant only), EPCAM (Deletion/duplication testing only), FH, FLCN, GATA2, GPC3, GREM1 (Promoter region deletion/duplication testing only), HOXB13 (c.251G>A, p.Gly84Glu), HRAS, KIT, MAX, MEN1, MET, MITF (c.952G>A, p.Glu318Lys variant only), MLH1, MSH2, MSH3, MSH6, MUTYH, NBN, NF1, NF2, NTHL1, PALB2, PDGFRA, PHOX2B, PMS2, POLD1, POLE, POT1, PRKAR1A, PTCH1, PTEN, RAD50, RAD51C, RAD51D, RB1, RECQL4, RET, RNF43, RUNX1, SDHAF2, SDHA (sequence changes only), SDHB, SDHC, SDHD, SMAD4, SMARCA4, SMARCB1, SMARCE1, STK11, SUFU, TERC, TERT, TMEM127, TP53, TSC1, TSC2, VHL, WRN and WT1.  The report date is 05/27/2018.    06/09/2018 Cancer Staging    Staging form: Corpus Uteri - Carcinoma and Carcinosarcoma, AJCC 8th Edition - Pathologic: Stage I (pT1, pN0, cM0) - Signed by Heath Lark, MD on 06/09/2018    06/15/2018 Procedure    Placement of single lumen port a cath via right internal jugular vein. The catheter tip lies at the cavo-atrial junction. A power injectable port a cath was placed and is ready for immediate use.     06/20/2018 -  Chemotherapy    The patient had carboplatin and Taxol    06/25/2018 - 06/27/2018 Hospital Admission    She was admitted to the hospital with deydration     REVIEW OF SYSTEMS:   Constitutional: Denies fevers, chills or abnormal weight loss Eyes: Denies blurriness of vision Ears, nose, mouth, throat, and face: Denies mucositis or sore throat Respiratory: Denies cough, dyspnea or wheezes Cardiovascular: Denies palpitation, chest discomfort or lower extremity swelling Skin: Denies abnormal skin rashes Lymphatics: Denies new lymphadenopathy or easy  bruising Neurological:Denies numbness, tingling or new weaknesses Behavioral/Psych: Mood is stable, no new  changes  All other systems were reviewed with the patient and are negative.  I have reviewed the past medical history, past surgical history, social history and family history with the patient and they are unchanged from previous note.  ALLERGIES:  is allergic to amlodipine; compazine [prochlorperazine edisylate]; gabapentin; lyrica [pregabalin]; and sertraline.  MEDICATIONS:  Current Outpatient Medications  Medication Sig Dispense Refill  . acetaminophen (TYLENOL) 325 MG tablet Take 2 tablets (650 mg total) by mouth every 6 (six) hours as needed for mild pain, fever or headache (or Fever >/= 101). 15 tablet 0  . aspirin EC 81 MG tablet Take 81 mg by mouth daily.    Marland Kitchen atenolol (TENORMIN) 100 MG tablet Take 1 tablet (100 mg total) by mouth at bedtime. For BP 30 tablet 1  . atorvastatin (LIPITOR) 20 MG tablet Take 20 mg by mouth at bedtime.   0  . cholecalciferol (VITAMIN D) 400 units TABS tablet Take 400 Units by mouth daily.    . cyclobenzaprine (FLEXERIL) 5 MG tablet Take 5 mg by mouth 3 (three) times daily.     Marland Kitchen dexamethasone (DECADRON) 4 MG tablet Take 2 tabs at the night before and 2 tab the morning of chemotherapy, every 3 weeks, by mouth 36 tablet 0  . hydrALAZINE (APRESOLINE) 25 MG tablet Take 1 tablet (25 mg total) by mouth 3 (three) times daily. 90 tablet 0  . lidocaine-prilocaine (EMLA) cream Apply to affected area once 30 g 3  . metFORMIN (GLUCOPHAGE) 500 MG tablet Take 1 tablet (500 mg total) by mouth daily with breakfast. 30 tablet 1  . Omega-3 Fatty Acids (FISH OIL) 1200 MG CAPS Take 1,200 mg by mouth daily.     Marland Kitchen omeprazole (PRILOSEC) 20 MG capsule Take 1 capsule (20 mg total) by mouth daily. 30 capsule 2  . ondansetron (ZOFRAN) 8 MG tablet Take 1 tablet (8 mg total) by mouth every 8 (eight) hours as needed for refractory nausea / vomiting. Start on day 3 after chemo. 15  tablet 1  . oxyCODONE-acetaminophen (PERCOCET/ROXICET) 5-325 MG tablet Take 2 tablets by mouth every 6 (six) hours as needed for moderate pain or severe pain. 60 tablet 0  . promethazine (PHENERGAN) 25 MG tablet Take 1 tablet (25 mg total) by mouth every 6 (six) hours as needed for nausea or vomiting. 30 tablet 0  . Propylene Glycol (SYSTANE BALANCE) 0.6 % SOLN Place 1 drop into both eyes every morning.    . senna-docusate (SENOKOT-S) 8.6-50 MG tablet Take 2 tablets by mouth at bedtime. 60 tablet 1   No current facility-administered medications for this visit.     PHYSICAL EXAMINATION: ECOG PERFORMANCE STATUS: 1 - Symptomatic but completely ambulatory  Vitals:   07/08/18 0959  BP: 135/87  Pulse: 77  Resp: 18  Temp: 98.4 F (36.9 C)  SpO2: 97%   Filed Weights   07/08/18 0959  Weight: 199 lb 12.8 oz (90.6 kg)    GENERAL:alert, no distress and comfortable SKIN: skin color, texture, turgor are normal, no rashes or significant lesions EYES: normal, Conjunctiva are pink and non-injected, sclera clear OROPHARYNX:no exudate, no erythema and lips, buccal mucosa, and tongue normal  NECK: supple, thyroid normal size, non-tender, without nodularity LYMPH:  no palpable lymphadenopathy in the cervical, axillary or inguinal LUNGS: clear to auscultation and percussion with normal breathing effort HEART: regular rate & rhythm and no murmurs and no lower extremity edema ABDOMEN:abdomen soft, non-tender and normal bowel sounds Musculoskeletal:no cyanosis of digits and no clubbing  NEURO: alert & oriented x 3 with fluent speech, no focal motor/sensory deficits  LABORATORY DATA:  I have reviewed the data as listed    Component Value Date/Time   NA 142 07/08/2018 0924   K 4.1 07/08/2018 0924   CL 108 07/08/2018 0924   CO2 25 07/08/2018 0924   GLUCOSE 134 (H) 07/08/2018 0924   BUN 16 07/08/2018 0924   CREATININE 0.73 07/08/2018 0924   CALCIUM 9.0 07/08/2018 0924   PROT 6.3 (L) 07/08/2018  0924   ALBUMIN 3.2 (L) 07/08/2018 0924   AST 36 07/08/2018 0924   ALT 52 (H) 07/08/2018 0924   ALKPHOS 189 (H) 07/08/2018 0924   BILITOT 0.3 07/08/2018 0924   GFRNONAA >60 07/08/2018 0924   GFRAA >60 07/08/2018 0924    No results found for: SPEP, UPEP  Lab Results  Component Value Date   WBC 9.1 07/08/2018   NEUTROABS 5.6 07/08/2018   HGB 12.2 07/08/2018   HCT 37.1 07/08/2018   MCV 98.1 07/08/2018   PLT 298 07/08/2018      Chemistry      Component Value Date/Time   NA 142 07/08/2018 0924   K 4.1 07/08/2018 0924   CL 108 07/08/2018 0924   CO2 25 07/08/2018 0924   BUN 16 07/08/2018 0924   CREATININE 0.73 07/08/2018 0924      Component Value Date/Time   CALCIUM 9.0 07/08/2018 0924   ALKPHOS 189 (H) 07/08/2018 0924   AST 36 07/08/2018 0924   ALT 52 (H) 07/08/2018 0924   BILITOT 0.3 07/08/2018 0924       RADIOGRAPHIC STUDIES: I have personally reviewed the radiological images as listed and agreed with the findings in the report. US Renal  Result Date: 06/25/2018 CLINICAL DATA:  Acute renal failure EXAM: RENAL / URINARY TRACT ULTRASOUND COMPLETE COMPARISON:  CT 04/27/2018 FINDINGS: Right Kidney: Renal measurements: 12.8 x 5.2 x 4.5 cm = volume: 156.2 mL . Echogenicity within normal limits. No mass or hydronephrosis visualized. Left Kidney: Renal measurements: 11.7 x 5.4 x 5.9 cm = volume: 195 mL. Echogenicity within normal limits. No mass or hydronephrosis visualized. Bladder: Appears normal for degree of bladder distention. IMPRESSION: No acute process or explanation for acute renal failure. Electronically Signed   By: Abigail Miyamoto M.D.   On: 06/25/2018 22:29   Ir Imaging Guided Port Insertion  Result Date: 06/15/2018 CLINICAL DATA:  Endometrial carcinoma and need for porta cath for chemotherapy. EXAM: IMPLANTED PORT A CATH PLACEMENT WITH ULTRASOUND AND FLUOROSCOPIC GUIDANCE ANESTHESIA/SEDATION: 4.0 mg IV Versed; 100 mcg IV Fentanyl Total Moderate Sedation Time:  38  minutes The patient's level of consciousness and physiologic status were continuously monitored during the procedure by Radiology nursing. Additional Medications: 2 g IV Ancef. FLUOROSCOPY TIME:  1 minute and 24 seconds.  15.1 mGy. PROCEDURE: The procedure, risks, benefits, and alternatives were explained to the patient. Questions regarding the procedure were encouraged and answered. The patient understands and consents to the procedure. A time-out was performed prior to initiating the procedure. Ultrasound was utilized to confirm patency of the right internal jugular vein. The right neck and chest were prepped with chlorhexidine in a sterile fashion, and a sterile drape was applied covering the operative field. Maximum barrier sterile technique with sterile gowns and gloves were used for the procedure. Local anesthesia was provided with 1% lidocaine. After creating a small venotomy incision, a 21 gauge needle was advanced into the right internal jugular vein under direct, real-time ultrasound guidance. Ultrasound image documentation  was performed. After securing guidewire access, an 8 Fr dilator was placed. A J-wire was kinked to measure appropriate catheter length. A subcutaneous port pocket was then created along the upper chest wall utilizing sharp and blunt dissection. Portable cautery was utilized. The pocket was irrigated with sterile saline. A single lumen power injectable port was chosen for placement. The 8 Fr catheter was tunneled from the port pocket site to the venotomy incision. The port was placed in the pocket. External catheter was trimmed to appropriate length based on guidewire measurement. At the venotomy, an 8 Fr peel-away sheath was placed over a guidewire. The catheter was then placed through the sheath and the sheath removed. Final catheter positioning was confirmed and documented with a fluoroscopic spot image. The port was accessed with a needle and aspirated and flushed with heparinized  saline. The access needle was removed. The venotomy and port pocket incisions were closed with subcutaneous 3-0 Monocryl and subcuticular 4-0 Vicryl. Dermabond was applied to both incisions. COMPLICATIONS: COMPLICATIONS None FINDINGS: After catheter placement, the tip lies at the cavo-atrial junction. The catheter aspirates normally and is ready for immediate use. IMPRESSION: Placement of single lumen port a cath via right internal jugular vein. The catheter tip lies at the cavo-atrial junction. A power injectable port a cath was placed and is ready for immediate use. Electronically Signed   By: Aletta Edouard M.D.   On: 06/15/2018 15:53    All questions were answered. The patient knows to call the clinic with any problems, questions or concerns. No barriers to learning was detected.  I spent 30 minutes counseling the patient face to face. The total time spent in the appointment was 40 minutes and more than 50% was on counseling and review of test results  Heath Lark, MD 07/08/2018 3:52 PM

## 2018-07-08 NOTE — Assessment & Plan Note (Signed)
The patient has suboptimal blood pressure control.  We discussed increasing hydralazine to 25 mg 3 times a day

## 2018-07-08 NOTE — Assessment & Plan Note (Signed)
We discussed aggressive dietary management and reducing dexamethasone premedication

## 2018-07-08 NOTE — Telephone Encounter (Signed)
Gave avs and calendar ° °

## 2018-07-08 NOTE — Assessment & Plan Note (Signed)
She has recent flare of arthritis pain requiring frequent intake of pain medicine. She will continue the same We discussed narcotic refill policy

## 2018-07-11 ENCOUNTER — Telehealth: Payer: Self-pay | Admitting: Oncology

## 2018-07-11 ENCOUNTER — Inpatient Hospital Stay: Payer: Medicare Other

## 2018-07-11 VITALS — BP 143/87 | HR 77 | Temp 98.1°F | Resp 16

## 2018-07-11 DIAGNOSIS — C541 Malignant neoplasm of endometrium: Secondary | ICD-10-CM | POA: Diagnosis not present

## 2018-07-11 MED ORDER — SODIUM CHLORIDE 0.9 % IV SOLN
140.0000 mg/m2 | Freq: Once | INTRAVENOUS | Status: AC
  Administered 2018-07-11: 282 mg via INTRAVENOUS
  Filled 2018-07-11: qty 47

## 2018-07-11 MED ORDER — DIPHENHYDRAMINE HCL 50 MG/ML IJ SOLN
INTRAMUSCULAR | Status: AC
  Filled 2018-07-11: qty 1

## 2018-07-11 MED ORDER — PALONOSETRON HCL INJECTION 0.25 MG/5ML
0.2500 mg | Freq: Once | INTRAVENOUS | Status: AC
  Administered 2018-07-11: 0.25 mg via INTRAVENOUS

## 2018-07-11 MED ORDER — SODIUM CHLORIDE 0.9% FLUSH
10.0000 mL | INTRAVENOUS | Status: DC | PRN
  Administered 2018-07-11: 10 mL
  Filled 2018-07-11: qty 10

## 2018-07-11 MED ORDER — FAMOTIDINE IN NACL 20-0.9 MG/50ML-% IV SOLN
INTRAVENOUS | Status: AC
  Filled 2018-07-11: qty 50

## 2018-07-11 MED ORDER — SODIUM CHLORIDE 0.9 % IV SOLN
Freq: Once | INTRAVENOUS | Status: AC
  Administered 2018-07-11: 08:00:00 via INTRAVENOUS
  Filled 2018-07-11: qty 250

## 2018-07-11 MED ORDER — HEPARIN SOD (PORK) LOCK FLUSH 100 UNIT/ML IV SOLN
500.0000 [IU] | Freq: Once | INTRAVENOUS | Status: AC | PRN
  Administered 2018-07-11: 500 [IU]
  Filled 2018-07-11: qty 5

## 2018-07-11 MED ORDER — SODIUM CHLORIDE 0.9 % IV SOLN
Freq: Once | INTRAVENOUS | Status: AC
  Administered 2018-07-11: 09:00:00 via INTRAVENOUS
  Filled 2018-07-11: qty 5

## 2018-07-11 MED ORDER — PALONOSETRON HCL INJECTION 0.25 MG/5ML
INTRAVENOUS | Status: AC
  Filled 2018-07-11: qty 5

## 2018-07-11 MED ORDER — SODIUM CHLORIDE 0.9 % IV SOLN
516.5000 mg | Freq: Once | INTRAVENOUS | Status: AC
  Administered 2018-07-11: 520 mg via INTRAVENOUS
  Filled 2018-07-11: qty 52

## 2018-07-11 MED ORDER — DIPHENHYDRAMINE HCL 50 MG/ML IJ SOLN
50.0000 mg | Freq: Once | INTRAMUSCULAR | Status: AC
  Administered 2018-07-11: 50 mg via INTRAVENOUS

## 2018-07-11 MED ORDER — FAMOTIDINE IN NACL 20-0.9 MG/50ML-% IV SOLN
20.0000 mg | Freq: Once | INTRAVENOUS | Status: AC
  Administered 2018-07-11: 20 mg via INTRAVENOUS

## 2018-07-11 NOTE — Telephone Encounter (Signed)
Left a message for [facility] regarding prescription for PT.  Requested a return call.

## 2018-07-11 NOTE — Patient Instructions (Signed)
   [facility] Discharge Instructions for Patients Receiving Chemotherapy  Today you received the following chemotherapy agents Taxol and Carboplatin   To help prevent nausea and vomiting after your treatment, we encourage you to take your nausea medication as directed.    If you develop nausea and vomiting that is not controlled by your nausea medication, call the clinic.   BELOW ARE SYMPTOMS THAT SHOULD BE REPORTED IMMEDIATELY:  *FEVER GREATER THAN 100.5 F  *CHILLS WITH OR WITHOUT FEVER  NAUSEA AND VOMITING THAT IS NOT CONTROLLED WITH YOUR NAUSEA MEDICATION  *UNUSUAL SHORTNESS OF BREATH  *UNUSUAL BRUISING OR BLEEDING  TENDERNESS IN MOUTH AND THROAT WITH OR WITHOUT PRESENCE OF ULCERS  *URINARY PROBLEMS  *BOWEL PROBLEMS  UNUSUAL RASH Items with * indicate a potential emergency and should be followed up as soon as possible.  Feel free to call the clinic should you have any questions or concerns. The clinic phone number is [phone].  Please show the CHEMO ALERT CARD at check-in to the Emergency Department and triage nurse.   

## 2018-07-11 NOTE — Telephone Encounter (Signed)
Faxed physical therapy prescription and office note to [place], Attn: Elizebeth Brooking at [phone].

## 2018-07-14 ENCOUNTER — Inpatient Hospital Stay (HOSPITAL_BASED_OUTPATIENT_CLINIC_OR_DEPARTMENT_OTHER): Payer: Medicare Other | Admitting: Hematology and Oncology

## 2018-07-14 ENCOUNTER — Inpatient Hospital Stay: Payer: Medicare Other

## 2018-07-14 ENCOUNTER — Encounter: Payer: Self-pay | Admitting: Oncology

## 2018-07-14 ENCOUNTER — Encounter: Payer: Self-pay | Admitting: Hematology and Oncology

## 2018-07-14 ENCOUNTER — Telehealth: Payer: Self-pay | Admitting: Hematology and Oncology

## 2018-07-14 VITALS — BP 156/74 | HR 89 | Temp 98.4°F | Resp 18

## 2018-07-14 DIAGNOSIS — I10 Essential (primary) hypertension: Secondary | ICD-10-CM

## 2018-07-14 DIAGNOSIS — R109 Unspecified abdominal pain: Secondary | ICD-10-CM | POA: Diagnosis not present

## 2018-07-14 DIAGNOSIS — E119 Type 2 diabetes mellitus without complications: Secondary | ICD-10-CM

## 2018-07-14 DIAGNOSIS — T451X5A Adverse effect of antineoplastic and immunosuppressive drugs, initial encounter: Secondary | ICD-10-CM

## 2018-07-14 DIAGNOSIS — C541 Malignant neoplasm of endometrium: Secondary | ICD-10-CM

## 2018-07-14 DIAGNOSIS — R110 Nausea: Secondary | ICD-10-CM

## 2018-07-14 DIAGNOSIS — Z7982 Long term (current) use of aspirin: Secondary | ICD-10-CM

## 2018-07-14 DIAGNOSIS — R112 Nausea with vomiting, unspecified: Secondary | ICD-10-CM | POA: Diagnosis not present

## 2018-07-14 DIAGNOSIS — K5909 Other constipation: Secondary | ICD-10-CM

## 2018-07-14 DIAGNOSIS — Z7984 Long term (current) use of oral hypoglycemic drugs: Secondary | ICD-10-CM

## 2018-07-14 DIAGNOSIS — Z95828 Presence of other vascular implants and grafts: Secondary | ICD-10-CM

## 2018-07-14 DIAGNOSIS — Z79899 Other long term (current) drug therapy: Secondary | ICD-10-CM

## 2018-07-14 MED ORDER — SODIUM CHLORIDE 0.9 % IV SOLN
Freq: Once | INTRAVENOUS | Status: AC
  Administered 2018-07-14: 10:00:00 via INTRAVENOUS
  Filled 2018-07-14: qty 250

## 2018-07-14 MED ORDER — SODIUM CHLORIDE 0.9% FLUSH
10.0000 mL | INTRAVENOUS | Status: DC | PRN
  Administered 2018-07-14: 10 mL
  Filled 2018-07-14: qty 10

## 2018-07-14 MED ORDER — PROMETHAZINE HCL 25 MG/ML IJ SOLN
INTRAMUSCULAR | Status: AC
  Filled 2018-07-14: qty 1

## 2018-07-14 MED ORDER — HEPARIN SOD (PORK) LOCK FLUSH 100 UNIT/ML IV SOLN
500.0000 [IU] | Freq: Once | INTRAVENOUS | Status: AC | PRN
  Administered 2018-07-14: 500 [IU]
  Filled 2018-07-14: qty 5

## 2018-07-14 MED ORDER — PROMETHAZINE HCL 25 MG/ML IJ SOLN
12.5000 mg | Freq: Every day | INTRAMUSCULAR | Status: DC
  Administered 2018-07-14: 12.5 mg via INTRAVENOUS

## 2018-07-14 NOTE — Assessment & Plan Note (Signed)
We discussed laxative therapy. 

## 2018-07-14 NOTE — Assessment & Plan Note (Signed)
She is struggling with significant chemotherapy-induced nausea and vomiting I recommend IV fluids along with IV antiemetics the next 3 days

## 2018-07-14 NOTE — Progress Notes (Signed)
[place] OFFICE PROGRESS NOTE  Patient Care Team: Javier Glazier, MD as PCP - General (Internal Medicine)  ASSESSMENT & PLAN:  Endometrial cancer [facility]) She tolerated chemotherapy better with additional daily dexamethasone and scheduled laxatives and antiemetics but she is still struggling with side effects of treatment in terms of nausea and changes in bowel habits I recommend IV fluids and IV antiemetic support for the next 3 days She will explore the option whether home IV fluids with home IV antiemetics are options available at her current living place I will call her tomorrow for symptom management and plan to check on her again next week  Essential hypertension Her blood pressure is grossly elevated but better today For now, I do not plan to change her anti-hypertensives  Diabetes mellitus without complication ([place]) Her blood sugar is improved since reducing the dose of dexamethasone Monitor only for now  Chemotherapy-induced nausea She is struggling with significant chemotherapy-induced nausea and vomiting I recommend IV fluids along with IV antiemetics the next 3 days  Other constipation We discussed laxative therapy   No orders of the defined types were placed in this encounter.   INTERVAL HISTORY: Please see below for problem oriented charting. 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 Endometrium,  biopsy - HIGH GRADE CARCINOMA, SEE COMMENT. 2. Cervix, biopsy - UNREMARKABLE ENDOCERVICAL GLANDULAR AND SQUAMOUS MUCOSA. - MULTIPLE DETACHED FRAGMENTS OF HIGH GRADE CARCINOMA, MORPHOLOGICALLY SIMILAR TO SPECIMEN 1. Microscopic Comment 1. 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    04/21/2018 Imaging    US pelvis Abnormally thickened endometrium measuring up to 16 mm. In the setting of post-menopausal bleeding, endometrial sampling is indicated to exclude carcinoma. If results are benign, sonohysterogram should be considered for focal lesion work-up.     04/27/2018 Imaging    Ct abdomen and pelvis 1. Thickening of the endometrium identified compatible with endometrial carcinoma. 2. No findings to suggest metastatic adenopathy or distant metastatic disease. 3. Hiatal hernia 4.  Aortic Atherosclerosis (ICD10-I70.0).     05/02/2018 Tumor Marker    Patient's tumor was tested for the following markers: CA-125 Results of the tumor marker test revealed 65.3    05/03/2018 Pathology Results    1. Lymph node, sentinel, biopsy, right obturator - ONE OF ONE LYMPH NODES NEGATIVE FOR CARCINOMA (0/1). 2. Lymph node, sentinel, biopsy, left external iliac - FOUR OF FOUR LYMPH NODES NEGATIVE FOR CARCINOMA (0/4). 3. 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 TUMOR INVADES LESS THAN ONE HALF OF THE MYOMETRIUM. SEE ONCOLOGY TABLE. -SEROSA: UNREMARKABLE. NO MALIGNANCY. - CERVIX: BENIGN SQUAMOUS AND ENDOCERVICAL MUCOSA. NO DYSPLASIA OR MALIGNANCY. - BILATERAL OVARIES: INCLUSION CYSTS. NO MALIGNANCY. - BILATERAL FALLOPIAN TUBES: UNREMARKABLE. NO MALIGNANCY. Microscopic Comment 3. UTERUS, CARCINOMA OR CARCINOSARCOMA Procedure: Total hysterectomy with  bilateral salpingo-oophorectomy. Right  obturator and left external iliac lymph node biopsies. Histologic type: Endometrioid adenocarcinoma with squamous differentiation. Histologic Grade: FIGO grade I. Myometrial invasion: Depth of invasion: 3 mm Myometrial thickness: 12 mm Uterine Serosa Involvement: Not identified. Cervical stromal involvement: Not identified. Extent of involvement of other organs: Uninvolved. Lymphovascular invasion: Not identified. Regional Lymph Nodes: Examined: 5 Sentinel 0 Non-sentinel 5 Total Lymph nodes with metastasis: 0 Isolated tumor cells (< 0.2 mm): 0 Micrometastasis: (> 0.2 mm and < 2.0 mm): 0 Macrometastasis: (> 2.0 mm): 0 Extracapsular extension: N/A. MMR / MSI testing: Will be ordered. Pathologic Stage Classification (pTNM, AJCC 8th edition): pT1a, pN0 FIGO Stage: IA Representative Tumor Block: 3D-G Comment: Pancytokeratin was performed on the lymph nodes and is negative    05/03/2018 Surgery    Operation: Robotic-assisted laparoscopic total hysterectomy with bilateral salpingoophorectomy, SLN biopsy   Surgeon: Donaciano Eva  Operative Findings:  : 6cm normal appearing uterus. Normal tubes and ovaries. Normal omentum and diaphragm. No suspicious nodes.      05/03/2018 Pathology Results    PERITONEAL WASHING(SPECIMEN 1 OF 1 COLLECTED 05/03/18): MALIGNANT CELLS CONSISTENT WITH METASTATIC ADENOCARCINOMA.    05/18/2018 Tumor Marker    Patient's tumor was tested for the following markers: CA-125 Results of the tumor marker test revealed 77.5    05/27/2018 Genetic Testing    MSH6 c.2731C>T pathogenic variant and GATA2 c.1232C>T VUS identified on the multicancer panel.  The Multi-Gene Panel offered by [facility] includes sequencing and/or deletion duplication testing of the following 85 genes: AIP, ALK, APC, ATM, AXIN2,BAP1,  BARD1, BLM, BMPR1A, BRCA1, BRCA2, BRIP1, CASR, CDC73, CDH1, CDK4, CDKN1B, CDKN1C, CDKN2A (p14ARF), CDKN2A  (p16INK4a), CEBPA, CHEK2, CTNNA1, DICER1, DIS3L2, EGFR (c.2369C>T, p.Thr790Met variant only), EPCAM (Deletion/duplication testing only), FH, FLCN, GATA2, GPC3, GREM1 (Promoter region deletion/duplication testing only), HOXB13 (c.251G>A, p.Gly84Glu), HRAS, KIT, MAX, MEN1, MET, MITF (c.952G>A, p.Glu318Lys variant only), MLH1, MSH2, MSH3, MSH6, MUTYH, NBN, NF1, NF2, NTHL1, PALB2, PDGFRA, PHOX2B, PMS2, POLD1, POLE, POT1, PRKAR1A, PTCH1, PTEN, RAD50, RAD51C, RAD51D, RB1, RECQL4, RET, RNF43, RUNX1, SDHAF2, SDHA (sequence changes only), SDHB, SDHC, SDHD, SMAD4, SMARCA4, SMARCB1, SMARCE1, STK11, SUFU, TERC, TERT, TMEM127, TP53, TSC1, TSC2, VHL, WRN and WT1.  The report date is 05/27/2018.    06/09/2018 Cancer Staging    Staging form: Corpus Uteri - Carcinoma and Carcinosarcoma, AJCC 8th Edition - Pathologic: Stage I (pT1, pN0, cM0) - Signed by Heath Lark, MD on 06/09/2018    06/15/2018 Procedure    Placement of single lumen port a cath via right internal jugular vein. The catheter tip lies at the cavo-atrial junction. A power injectable port a cath was placed and is ready for immediate use.     06/20/2018 -  Chemotherapy    The patient had carboplatin and Taxol    06/25/2018 - 06/27/2018 Hospital Admission    She was admitted to the hospital with deydration     REVIEW OF SYSTEMS:   Constitutional: Denies fevers, chills or abnormal weight loss Eyes: Denies blurriness of vision Ears, nose, mouth, throat, and face: Denies mucositis or sore throat Respiratory: Denies cough, dyspnea or wheezes Cardiovascular: Denies palpitation, chest discomfort or lower extremity swelling Skin: Denies abnormal skin rashes Lymphatics: Denies new lymphadenopathy or easy bruising Neurological:Denies numbness, tingling or new weaknesses Behavioral/Psych: Mood is stable, no new changes  All other systems were reviewed with the patient and are negative.  I have reviewed the past medical history, past surgical history,  social history and family history with the patient and they are unchanged from previous  note.  ALLERGIES:  is allergic to amlodipine; compazine [prochlorperazine edisylate]; gabapentin; lyrica [pregabalin]; and sertraline.  MEDICATIONS:  Current Outpatient Medications  Medication Sig Dispense Refill  . acetaminophen (TYLENOL) 325 MG tablet Take 2 tablets (650 mg total) by mouth every 6 (six) hours as needed for mild pain, fever or headache (or Fever >/= 101). 15 tablet 0  . aspirin EC 81 MG tablet Take 81 mg by mouth daily.    Marland Kitchen atenolol (TENORMIN) 100 MG tablet Take 1 tablet (100 mg total) by mouth at bedtime. For BP 30 tablet 1  . atorvastatin (LIPITOR) 20 MG tablet Take 20 mg by mouth at bedtime.   0  . cholecalciferol (VITAMIN D) 400 units TABS tablet Take 400 Units by mouth daily.    . cyclobenzaprine (FLEXERIL) 5 MG tablet Take 5 mg by mouth 3 (three) times daily.     Marland Kitchen dexamethasone (DECADRON) 4 MG tablet Take 2 tabs at the night before and 2 tab the morning of chemotherapy, every 3 weeks, by mouth 36 tablet 0  . hydrALAZINE (APRESOLINE) 25 MG tablet Take 1 tablet (25 mg total) by mouth 3 (three) times daily. 90 tablet 0  . lidocaine-prilocaine (EMLA) cream Apply to affected area once 30 g 3  . metFORMIN (GLUCOPHAGE) 500 MG tablet Take 1 tablet (500 mg total) by mouth daily with breakfast. 30 tablet 1  . Omega-3 Fatty Acids (FISH OIL) 1200 MG CAPS Take 1,200 mg by mouth daily.     Marland Kitchen omeprazole (PRILOSEC) 20 MG capsule Take 1 capsule (20 mg total) by mouth daily. 30 capsule 2  . ondansetron (ZOFRAN) 8 MG tablet Take 1 tablet (8 mg total) by mouth every 8 (eight) hours as needed for refractory nausea / vomiting. Start on day 3 after chemo. 15 tablet 1  . oxyCODONE-acetaminophen (PERCOCET/ROXICET) 5-325 MG tablet Take 2 tablets by mouth every 6 (six) hours as needed for moderate pain or severe pain. 60 tablet 0  . promethazine (PHENERGAN) 25 MG tablet Take 1 tablet (25 mg total) by mouth  every 6 (six) hours as needed for nausea or vomiting. 30 tablet 0  . Propylene Glycol (SYSTANE BALANCE) 0.6 % SOLN Place 1 drop into both eyes every morning.    . senna-docusate (SENOKOT-S) 8.6-50 MG tablet Take 2 tablets by mouth at bedtime. 60 tablet 1   No current facility-administered medications for this visit.    Facility-Administered Medications Ordered in Other Visits  Medication Dose Route Frequency Provider Last Rate Last Dose  . heparin lock flush 100 unit/mL  500 Units Intracatheter Once PRN Alvy Bimler, Nathen Balaban, MD      . promethazine (PHENERGAN) injection 12.5 mg  12.5 mg Intravenous Daily Alvy Bimler, Alohilani Levenhagen, MD   12.5 mg at 07/14/18 1005  . sodium chloride flush (NS) 0.9 % injection 10 mL  10 mL Intracatheter PRN Alvy Bimler, Menelik Mcfarren, MD        PHYSICAL EXAMINATION: ECOG PERFORMANCE STATUS: 1 - Symptomatic but completely ambulatory  Vitals:   07/14/18 0908  BP: (!) 148/70  Pulse: 78  Resp: 18  Temp: 98.5 F (36.9 C)  SpO2: 93%   Filed Weights   07/14/18 0908  Weight: 198 lb 3.2 oz (89.9 kg)    GENERAL:alert, no distress and comfortable SKIN: skin color, texture, turgor are normal, no rashes or significant lesions EYES: normal, Conjunctiva are pink and non-injected, sclera clear OROPHARYNX:no exudate, no erythema and lips, buccal mucosa, and tongue normal  NECK: supple, thyroid normal size, non-tender, without nodularity LYMPH:  no palpable lymphadenopathy in the cervical, axillary or inguinal LUNGS: clear to auscultation and percussion with normal breathing effort HEART: regular rate & rhythm and no murmurs and no lower extremity edema ABDOMEN:abdomen soft, non-tender and normal bowel sounds Musculoskeletal:no cyanosis of digits and no clubbing  NEURO: alert & oriented x 3 with fluent speech, no focal motor/sensory deficits  LABORATORY DATA:  I have reviewed the data as listed    Component Value Date/Time   NA 142 07/08/2018 0924   K 4.1 07/08/2018 0924   CL 108 07/08/2018 0924    CO2 25 07/08/2018 0924   GLUCOSE 134 (H) 07/08/2018 0924   BUN 16 07/08/2018 0924   CREATININE 0.73 07/08/2018 0924   CALCIUM 9.0 07/08/2018 0924   PROT 6.3 (L) 07/08/2018 0924   ALBUMIN 3.2 (L) 07/08/2018 0924   AST 36 07/08/2018 0924   ALT 52 (H) 07/08/2018 0924   ALKPHOS 189 (H) 07/08/2018 0924   BILITOT 0.3 07/08/2018 0924   GFRNONAA >60 07/08/2018 0924   GFRAA >60 07/08/2018 0924    No results found for: SPEP, UPEP  Lab Results  Component Value Date   WBC 9.1 07/08/2018   NEUTROABS 5.6 07/08/2018   HGB 12.2 07/08/2018   HCT 37.1 07/08/2018   MCV 98.1 07/08/2018   PLT 298 07/08/2018      Chemistry      Component Value Date/Time   NA 142 07/08/2018 0924   K 4.1 07/08/2018 0924   CL 108 07/08/2018 0924   CO2 25 07/08/2018 0924   BUN 16 07/08/2018 0924   CREATININE 0.73 07/08/2018 0924      Component Value Date/Time   CALCIUM 9.0 07/08/2018 0924   ALKPHOS 189 (H) 07/08/2018 0924   AST 36 07/08/2018 0924   ALT 52 (H) 07/08/2018 0924   BILITOT 0.3 07/08/2018 0924       RADIOGRAPHIC STUDIES: I have personally reviewed the radiological images as listed and agreed with the findings in the report. US Renal  Result Date: 06/25/2018 CLINICAL DATA:  Acute renal failure EXAM: RENAL / URINARY TRACT ULTRASOUND COMPLETE COMPARISON:  CT 04/27/2018 FINDINGS: Right Kidney: Renal measurements: 12.8 x 5.2 x 4.5 cm = volume: 156.2 mL . Echogenicity within normal limits. No mass or hydronephrosis visualized. Left Kidney: Renal measurements: 11.7 x 5.4 x 5.9 cm = volume: 195 mL. Echogenicity within normal limits. No mass or hydronephrosis visualized. Bladder: Appears normal for degree of bladder distention. IMPRESSION: No acute process or explanation for acute renal failure. Electronically Signed   By: Abigail Miyamoto M.D.   On: 06/25/2018 22:29   [facility] Imaging Guided Port Insertion  Result Date: 06/15/2018 CLINICAL DATA:  Endometrial carcinoma and need for porta cath for  chemotherapy. EXAM: IMPLANTED PORT A CATH PLACEMENT WITH ULTRASOUND AND FLUOROSCOPIC GUIDANCE ANESTHESIA/SEDATION: 4.0 mg IV Versed; 100 mcg IV Fentanyl Total Moderate Sedation Time:  38 minutes The patient's level of consciousness and physiologic status were continuously monitored during the procedure by Radiology nursing. Additional Medications: 2 g IV Ancef. FLUOROSCOPY TIME:  1 minute and 24 seconds.  15.1 mGy. PROCEDURE: The procedure, risks, benefits, and alternatives were explained to the patient. Questions regarding the procedure were encouraged and answered. The patient understands and consents to the procedure. A time-out was performed prior to initiating the procedure. Ultrasound was utilized to confirm patency of the right internal jugular vein. The right neck and chest were prepped with chlorhexidine in a sterile fashion, and a sterile drape was applied covering the operative field.  Maximum barrier sterile technique with sterile gowns and gloves were used for the procedure. Local anesthesia was provided with 1% lidocaine. After creating a small venotomy incision, a 21 gauge needle was advanced into the right internal jugular vein under direct, real-time ultrasound guidance. Ultrasound image documentation was performed. After securing guidewire access, an 8 Fr dilator was placed. A J-wire was kinked to measure appropriate catheter length. A subcutaneous port pocket was then created along the upper chest wall utilizing sharp and blunt dissection. Portable cautery was utilized. The pocket was irrigated with sterile saline. A single lumen power injectable port was chosen for placement. The 8 Fr catheter was tunneled from the port pocket site to the venotomy incision. The port was placed in the pocket. External catheter was trimmed to appropriate length based on guidewire measurement. At the venotomy, an 8 Fr peel-away sheath was placed over a guidewire. The catheter was then placed through the sheath and  the sheath removed. Final catheter positioning was confirmed and documented with a fluoroscopic spot image. The port was accessed with a needle and aspirated and flushed with heparinized saline. The access needle was removed. The venotomy and port pocket incisions were closed with subcutaneous 3-0 Monocryl and subcuticular 4-0 Vicryl. Dermabond was applied to both incisions. COMPLICATIONS: COMPLICATIONS None FINDINGS: After catheter placement, the tip lies at the cavo-atrial junction. The catheter aspirates normally and is ready for immediate use. IMPRESSION: Placement of single lumen port a cath via right internal jugular vein. The catheter tip lies at the cavo-atrial junction. A power injectable port a cath was placed and is ready for immediate use. Electronically Signed   By: Aletta Edouard M.D.   On: 06/15/2018 15:53    All questions were answered. The patient knows to call the clinic with any problems, questions or concerns. No barriers to learning was detected.  I spent 25 minutes counseling the patient face to face. The total time spent in the appointment was 30 minutes and more than 50% was on counseling and review of test results  Heath Lark, MD 07/14/2018 10:45 AM

## 2018-07-14 NOTE — Assessment & Plan Note (Signed)
She tolerated chemotherapy better with additional daily dexamethasone and scheduled laxatives and antiemetics but she is still struggling with side effects of treatment in terms of nausea and changes in bowel habits I recommend IV fluids and IV antiemetic support for the next 3 days She will explore the option whether home IV fluids with home IV antiemetics are options available at her current living place I will call her tomorrow for symptom management and plan to check on her again next week

## 2018-07-14 NOTE — Assessment & Plan Note (Signed)
Her blood pressure is grossly elevated but better today For now, I do not plan to change her anti-hypertensives

## 2018-07-14 NOTE — Telephone Encounter (Signed)
Gave avs and calendar ° °

## 2018-07-14 NOTE — Assessment & Plan Note (Signed)
Her blood sugar is improved since reducing the dose of dexamethasone Monitor only for now

## 2018-07-15 ENCOUNTER — Telehealth: Payer: Self-pay | Admitting: *Deleted

## 2018-07-15 ENCOUNTER — Ambulatory Visit (HOSPITAL_COMMUNITY)
Admission: RE | Admit: 2018-07-15 | Discharge: 2018-07-15 | Disposition: A | Discharge status: Home / Self Care | Payer: Medicare Other | Source: Ambulatory Visit | Attending: Hematology and Oncology | Admitting: Hematology and Oncology

## 2018-07-15 DIAGNOSIS — R110 Nausea: Secondary | ICD-10-CM | POA: Diagnosis not present

## 2018-07-15 MED ORDER — SODIUM CHLORIDE 0.9 % IV BOLUS
1000.0000 mL | Freq: Once | INTRAVENOUS | Status: AC
  Administered 2018-07-15: 1000 mL via INTRAVENOUS

## 2018-07-15 MED ORDER — SODIUM CHLORIDE 0.9% FLUSH
10.0000 mL | INTRAVENOUS | Status: AC | PRN
  Administered 2018-07-15: 10 mL

## 2018-07-15 MED ORDER — PROMETHAZINE HCL 25 MG/ML IJ SOLN
12.5000 mg | Freq: Once | INTRAMUSCULAR | Status: AC
  Administered 2018-07-15: 12.5 mg via INTRAVENOUS
  Filled 2018-07-15: qty 1

## 2018-07-15 MED ORDER — HEPARIN SOD (PORK) LOCK FLUSH 100 UNIT/ML IV SOLN
500.0000 [IU] | INTRAVENOUS | Status: AC | PRN
  Administered 2018-07-15: 500 [IU]
  Filled 2018-07-15: qty 5

## 2018-07-15 NOTE — Progress Notes (Signed)
PATIENT CARE CENTER NOTE  Diagnosis: Chemotherapy-induced nausea    Provider: Dr. Alvy Bimler   Procedure: Fluid bolus and IV antiemetic    Note: Patient received 1 liter bolus of 0.9% Sodium Chloride and IV Phenergan through PAC. Tolerated infusion well. Vital signs stable. Discharge instructions given. Patient alert, oriented and ambulatory at discharge.

## 2018-07-15 NOTE — Discharge Instructions (Signed)
Dehydration, Adult  Dehydration is when there is not enough fluid or water in your body. This happens when you lose more fluids than you take in. Dehydration can range from mild to very bad. It should be treated right away to keep it from getting very bad. Symptoms of mild dehydration may include:  Thirst.  Dry lips.  Slightly dry mouth.  Dry, warm skin.  Dizziness. Symptoms of moderate dehydration may include:  Very dry mouth.  Muscle cramps.  Dark pee (urine). Pee may be the color of tea.  Your body making less pee.  Your eyes making fewer tears.  Heartbeat that is uneven or faster than normal (palpitations).  Headache.  Light-headedness, especially when you stand up from sitting.  Fainting (syncope). Symptoms of very bad dehydration may include:  Changes in skin, such as: ? Cold and clammy skin. ? Blotchy (mottled) or pale skin. ? Skin that does not quickly return to normal after being lightly pinched and let go (poor skin turgor).  Changes in body fluids, such as: ? Feeling very thirsty. ? Your eyes making fewer tears. ? Not sweating when body temperature is high, such as in hot weather. ? Your body making very little pee.  Changes in vital signs, such as: ? Weak pulse. ? Pulse that is more than 100 beats a minute when you are sitting still. ? Fast breathing. ? Low blood pressure.  Other changes, such as: ? Sunken eyes. ? Cold hands and feet. ? Confusion. ? Lack of energy (lethargy). ? Trouble waking up from sleep. ? Short-term weight loss. ? Unconsciousness. Follow these instructions at home:   If told by your doctor, drink an ORS: ? Make an ORS by using instructions on the package. ? Start by drinking small amounts, about  cup (120 mL) every 5-10 minutes. ? Slowly drink more until you have had the amount that your doctor said to have.  Drink enough clear fluid to keep your pee clear or pale yellow. If you were told to drink an ORS, finish the  ORS first, then start slowly drinking clear fluids. Drink fluids such as: ? Water. Do not drink only water by itself. Doing that can make the salt (sodium) level in your body get too low (hyponatremia). ? Ice chips. ? Fruit juice that you have added water to (diluted). ? Low-calorie sports drinks.  Avoid: ? Alcohol. ? Drinks that have a lot of sugar. These include high-calorie sports drinks, fruit juice that does not have water added, and soda. ? Caffeine. ? Foods that are greasy or have a lot of fat or sugar.  Take over-the-counter and prescription medicines only as told by your doctor.  Do not take salt tablets. Doing that can make the salt level in your body get too high (hypernatremia).  Eat foods that have minerals (electrolytes). Examples include bananas, oranges, potatoes, tomatoes, and spinach.  Keep all follow-up visits as told by your doctor. This is important. Contact a doctor if:  You have belly (abdominal) pain that: ? Gets worse. ? Stays in one area (localizes).  You have a rash.  You have a stiff neck.  You get angry or annoyed more easily than normal (irritability).  You are more sleepy than normal.  You have a harder time waking up than normal.  You feel: ? Weak. ? Dizzy. ? Very thirsty.  You have peed (urinated) only a small amount of very dark pee during 6-8 hours. Get help right away if:  You have   symptoms of very bad dehydration.  You cannot drink fluids without throwing up (vomiting).  Your symptoms get worse with treatment.  You have a fever.  You have a very bad headache.  You are throwing up or having watery poop (diarrhea) and it: ? Gets worse. ? Does not go away.  You have blood or something green (bile) in your throw-up.  You have blood in your poop (stool). This may cause poop to look black and tarry.  You have not peed in 6-8 hours.  You pass out (faint).  Your heart rate when you are sitting still is more than 100 beats a  minute.  You have trouble breathing. This information is not intended to replace advice given to you by your health care provider. Make sure you discuss any questions you have with your health care provider. Document Released: 04/18/2009 Document Revised: 01/10/2016 Document Reviewed: 08/16/2015 Elsevier Interactive Patient Education  2019 [employer]  

## 2018-07-15 NOTE — Telephone Encounter (Signed)
-----   Message from Heath Lark, MD sent at 07/15/2018  7:42 AM EST ----- Regarding: symptom management Can you call and ask how she is feeling today? She had severe nausea and constipation

## 2018-07-15 NOTE — Telephone Encounter (Signed)
Telephone call to patient to check status. Patient reports a normal bowl movement this morning and nausea has improved. She is still concerned she is not drinking enough fluids. She confirms her appt for today and tomorrow.

## 2018-07-16 ENCOUNTER — Inpatient Hospital Stay: Payer: Medicare Other

## 2018-07-16 VITALS — BP 134/75 | HR 82 | Temp 99.2°F | Resp 18

## 2018-07-16 DIAGNOSIS — C541 Malignant neoplasm of endometrium: Secondary | ICD-10-CM | POA: Diagnosis not present

## 2018-07-16 DIAGNOSIS — Z95828 Presence of other vascular implants and grafts: Secondary | ICD-10-CM

## 2018-07-16 MED ORDER — PROMETHAZINE HCL 25 MG/ML IJ SOLN
INTRAMUSCULAR | Status: AC
  Filled 2018-07-16: qty 1

## 2018-07-16 MED ORDER — SODIUM CHLORIDE 0.9% FLUSH
10.0000 mL | Freq: Once | INTRAVENOUS | Status: AC | PRN
  Administered 2018-07-16: 10 mL
  Filled 2018-07-16: qty 10

## 2018-07-16 MED ORDER — HEPARIN SOD (PORK) LOCK FLUSH 100 UNIT/ML IV SOLN
500.0000 [IU] | Freq: Once | INTRAVENOUS | Status: AC | PRN
  Administered 2018-07-16: 500 [IU]
  Filled 2018-07-16: qty 5

## 2018-07-16 MED ORDER — SODIUM CHLORIDE 0.9 % IV SOLN
Freq: Once | INTRAVENOUS | Status: AC
  Administered 2018-07-16: 10:00:00 via INTRAVENOUS
  Filled 2018-07-16: qty 250

## 2018-07-16 MED ORDER — PROMETHAZINE HCL 25 MG/ML IJ SOLN
12.5000 mg | Freq: Every day | INTRAMUSCULAR | Status: DC
  Administered 2018-07-16: 12.5 mg via INTRAVENOUS

## 2018-07-16 NOTE — Patient Instructions (Signed)

## 2018-07-18 ENCOUNTER — Inpatient Hospital Stay: Payer: Medicare Other

## 2018-07-18 ENCOUNTER — Inpatient Hospital Stay (HOSPITAL_BASED_OUTPATIENT_CLINIC_OR_DEPARTMENT_OTHER): Payer: Medicare Other | Admitting: Medical

## 2018-07-18 ENCOUNTER — Telehealth: Payer: Self-pay

## 2018-07-18 VITALS — BP 130/84 | HR 103 | Temp 98.7°F | Resp 18 | Ht 64.0 in | Wt 194.2 lb

## 2018-07-18 DIAGNOSIS — Z8542 Personal history of malignant neoplasm of other parts of uterus: Secondary | ICD-10-CM

## 2018-07-18 DIAGNOSIS — R112 Nausea with vomiting, unspecified: Secondary | ICD-10-CM

## 2018-07-18 DIAGNOSIS — R5383 Other fatigue: Secondary | ICD-10-CM

## 2018-07-18 DIAGNOSIS — M2550 Pain in unspecified joint: Secondary | ICD-10-CM

## 2018-07-18 DIAGNOSIS — R21 Rash and other nonspecific skin eruption: Secondary | ICD-10-CM

## 2018-07-18 DIAGNOSIS — M1990 Unspecified osteoarthritis, unspecified site: Secondary | ICD-10-CM

## 2018-07-18 DIAGNOSIS — Z7984 Long term (current) use of oral hypoglycemic drugs: Secondary | ICD-10-CM

## 2018-07-18 DIAGNOSIS — Z79899 Other long term (current) drug therapy: Secondary | ICD-10-CM

## 2018-07-18 DIAGNOSIS — Z800 Family history of malignant neoplasm of digestive organs: Secondary | ICD-10-CM

## 2018-07-18 DIAGNOSIS — C541 Malignant neoplasm of endometrium: Secondary | ICD-10-CM

## 2018-07-18 DIAGNOSIS — R110 Nausea: Secondary | ICD-10-CM

## 2018-07-18 DIAGNOSIS — Z7982 Long term (current) use of aspirin: Secondary | ICD-10-CM

## 2018-07-18 DIAGNOSIS — F17210 Nicotine dependence, cigarettes, uncomplicated: Secondary | ICD-10-CM

## 2018-07-18 DIAGNOSIS — E119 Type 2 diabetes mellitus without complications: Secondary | ICD-10-CM

## 2018-07-18 LAB — CBC WITH DIFFERENTIAL (CANCER CENTER ONLY)
Abs Immature Granulocytes: 0.02 10*3/uL (ref 0.00–0.07)
Basophils Absolute: 0 10*3/uL (ref 0.0–0.1)
Basophils Relative: 1 %
Eosinophils Absolute: 0.2 10*3/uL (ref 0.0–0.5)
Eosinophils Relative: 3 %
HCT: 40.2 % (ref 36.0–46.0)
Hemoglobin: 13 g/dL (ref 12.0–15.0)
Immature Granulocytes: 0 %
Lymphocytes Relative: 42 %
Lymphs Abs: 2.3 10*3/uL (ref 0.7–4.0)
MCH: 32.3 pg (ref 26.0–34.0)
MCHC: 32.3 g/dL (ref 30.0–36.0)
MCV: 100 fL (ref 80.0–100.0)
Monocytes Absolute: 0.4 10*3/uL (ref 0.1–1.0)
Monocytes Relative: 7 %
Neutro Abs: 2.6 10*3/uL (ref 1.7–7.7)
Neutrophils Relative %: 47 %
Platelet Count: 247 10*3/uL (ref 150–400)
RBC: 4.02 MIL/uL (ref 3.87–5.11)
RDW: 14.2 % (ref 11.5–15.5)
WBC Count: 5.5 10*3/uL (ref 4.0–10.5)
nRBC: 0 % (ref 0.0–0.2)

## 2018-07-18 LAB — CMP (CANCER CENTER ONLY)
ALK PHOS: 168 U/L — AB (ref 38–126)
ALT: 42 U/L (ref 0–44)
AST: 20 U/L (ref 15–41)
Albumin: 4 g/dL (ref 3.5–5.0)
Anion gap: 11 (ref 5–15)
BUN: 13 mg/dL (ref 8–23)
CALCIUM: 9.9 mg/dL (ref 8.9–10.3)
CO2: 28 mmol/L (ref 22–32)
Chloride: 104 mmol/L (ref 98–111)
Creatinine: 0.75 mg/dL (ref 0.44–1.00)
GFR, Est AFR Am: >60 mL/min (ref >60)
GFR, Estimated: >60 mL/min (ref >60)
Glucose, Bld: 118 mg/dL — ABNORMAL HIGH (ref 70–99)
Potassium: 4.4 mmol/L (ref 3.5–5.1)
Sodium: 143 mmol/L (ref 135–145)
Total Bilirubin: 0.5 mg/dL (ref 0.3–1.2)
Total Protein: 7.1 g/dL (ref 6.5–8.1)

## 2018-07-18 NOTE — Telephone Encounter (Signed)
-----   Message from Heath Lark, MD sent at 07/18/2018  9:06 AM EST ----- Regarding: how is she Can you call and ask how is she? Does she need more IVF this week?  Has she ask if she can get IVF at her assisted living? She needs more appt but If she needs to come back I will schedule multiple appt

## 2018-07-18 NOTE — Telephone Encounter (Signed)
Called with below message. She denies nausea and does not feel like she needs IV fluids at this time. She said the assisted living facility can do IV fluids with a orders.   She is complaining of pain all over and a headache. She feeling like she could have the Flu. She has taken Oxycodone for the pain. She has a rash to her port site from the dressing. Will need a OpSite dressing for port site from now on. Offered appt with Sandi Mealy, PA. Appt scheduled at 3:30 today. She verbalized understanding.

## 2018-07-18 NOTE — Progress Notes (Signed)
Pt presents today with fatigue and gen body aches.  Denies fever or chills, denies N/V/D/C or changes in urination.  Denies congestion in head or chest, coughing, SOB, or CP.  Got flu-vaccine this year.  VSS.

## 2018-07-21 ENCOUNTER — Telehealth: Payer: Self-pay

## 2018-07-21 ENCOUNTER — Other Ambulatory Visit: Payer: Self-pay | Admitting: Hematology and Oncology

## 2018-07-21 NOTE — Progress Notes (Signed)
[facility] Progress Note   Jordan Martinez [id number] 07-16-1950 68 y.o.  Brandis Wixted is managed by Dr. Heath Lark  Actively treated with chemotherapy/immunotherapy/hormonal therapy: yes  Current Therapy: Carboplatin and paclitaxel  Last Treated: 07/11/2018 (cycle 2, day 1)  Assessment: Plan:    Nausea without vomiting - Plan: CBC with Differential ([place] Only), CMP ([place] only)  Arthralgia, unspecified joint - Plan: CBC with Differential ([place] Only), CMP ([place] only)  Endometrial cancer ([place])   Nausea and arthralgias: The patient was reassured after a CBC and chemistry panel returned stable.    Endometrial cancer: The patient is status post cycle 2, day 1 of carboplatin and paclitaxel which was dosed on 07/11/2018.  She is awaiting contact regarding her follow-up appointment.  Please see After Visit Summary for patient specific instructions.  Future Appointments  Date Time Provider [place]  08/10/2018 11:15 AM Milus Banister, MD [facility]    Orders Placed This Encounter  Procedures  . CBC with Differential ([place] Only)  . CMP ([place] only)       Subjective:   Patient ID:  Jordan Martinez is a 68 y.o. (DOB [birth year]) female.  Chief Complaint:  Chief Complaint  Patient presents with  . Fatigue    HPI Jordan Martinez is a 68 year old female with a history of an endometrial cancer who is followed by Dr. Heath Lark.  She is is status post cycle 2, day 1 of carboplatin and paclitaxel which was dosed on 07/11/2018.  She was most recently treated with chemotherapy last Monday and then received IV fluids and Phenergan on Thursday Friday and Saturday.  Despite this, she has been having myalgias and arthralgias.  She is also been having sweats.  She developed a red rash and tenderness over her port site after a dressing was applied there.  She denies fevers, chills, cough, rhinorrhea, nausea,  or vomiting.  She received an influenza vaccine this year.  Medications: I have reviewed the patient's current medications.  Allergies:  Allergies  Allergen Reactions  . Amlodipine     Blurred vision   . Compazine [Prochlorperazine Edisylate]     Stroke symptoms,  . Gabapentin     Blurred vision and nystagmus  . Lyrica [Pregabalin]     Blurred Vision   . Sertraline Other (See Comments)    Visual disturbances.     Past Medical History:  Diagnosis Date  . Anxiety   . Arthritis   . Depression   . Diabetes mellitus without complication (HCC)    diet controlled. States always has borderline HgbA1C  . Endometrial cancer ([place])   . Family history of colon cancer   . Family history of genetic mutation for hereditary nonpolyposis colorectal cancer (HNPCC)   . GERD (gastroesophageal reflux disease)   . High cholesterol   . History of kidney stones    years   . Hypertension   . Neuropathy   . Sensation of pressure in bladder area   . Uterine cramping     Past Surgical History:  Procedure Laterality Date  . burnt the nerves  Right 04/29/2018   in right knee   . burnt the nerves  Left 04/22/2018   left knee nerve   . cystoscopy kidney stone   1985  . IR IMAGING GUIDED PORT INSERTION  06/15/2018  . REPLACEMENT TOTAL KNEE BILATERAL Bilateral 2018   Right x 2. 2019 undergoing intermittent injection "RFA" procedures with Dr. Sheliah Mends [facility]  . REVISION TOTAL KNEE ARTHROPLASTY Right 03/2017  . ROBOTIC ASSISTED TOTAL HYSTERECTOMY WITH BILATERAL SALPINGO OOPHERECTOMY N/A 05/03/2018   Procedure: XI ROBOTIC ASSISTED TOTAL HYSTERECTOMY WITH BILATERAL SALPINGO OOPHORECTOMY;  Surgeon: Everitt Amber, MD;  Location: [facility];  Service: Gynecology;  Laterality: N/A;  . SENTINEL NODE BIOPSY N/A 05/03/2018   Procedure: SENTINEL NODE BIOPSY;  Surgeon: Everitt Amber, MD;  Location: [facility];  Service: Gynecology;  Laterality: N/A;  . TONSILLECTOMY AND ADENOIDECTOMY    . TUBAL LIGATION       Family History  Adopted: Yes  Problem Relation Age of Onset  . Colon cancer Daughter 91       Lynch syndrome - MSH6 +  . Healthy Son     Social History   Socioeconomic History  . Marital status: Divorced    Spouse name: Not on file  . Number of children: 2  . Years of education: Not on file  . Highest education level: Not on file  Occupational History  . Occupation: retired Marine scientist  Social Needs  . Financial resource strain: Not on file  . Food insecurity:    Worry: Not on file    Inability: Not on file  . Transportation needs:    Medical: Not on file    Non-medical: Not on file  Tobacco Use  . Smoking status: Current Every Day Smoker    Packs/day: 0.50    Years: 40.00    Pack years: 20.00    Types: Cigarettes  . Smokeless tobacco: Never Used  . Tobacco comment: 4-5 loose cigarettes per day , over 20 year hx of smoking   Substance and Sexual Activity  . Alcohol use: Yes    Comment: 2 glasses wine daily.   . Drug use: Never  . Sexual activity: Not Currently    Birth control/protection: None  Lifestyle  . Physical activity:    Days per week: Not on file    Minutes per session: Not on file  . Stress: Not on file  Relationships  . Social connections:    Talks on phone: Not on file    Gets together: Not on file    Attends religious service: Not on file    Active member of club or organization: Not on file    Attends meetings of clubs or organizations: Not on file    Relationship status: Not on file  . Intimate partner violence:    Fear of current or ex partner: Not on file    Emotionally abused: Not on file    Physically abused: Not on file    Forced sexual activity: Not on file  Other Topics Concern  . Not on file  Social History Narrative  . Not on file    Past Medical History, Surgical history, Social history, and Family history were reviewed and updated as appropriate.   Please see review of systems for further details on the patient's review from  today.   Review of Systems:  Review of Systems  Constitutional: Negative for chills, diaphoresis and fever.  HENT: Negative for facial swelling and trouble swallowing.   Respiratory: Negative for cough, chest tightness and shortness of breath.   Cardiovascular: Negative for chest pain.  Musculoskeletal: Positive for arthralgias and myalgias.  Skin: Positive for rash.    Objective:   Physical Exam:  BP 130/84 (BP Location: Right Arm, Patient Position: Sitting)   Pulse (!) 103 Comment: nurse aware of pulse  Temp 98.7 F (37.1 C) (Oral)  Resp 18   Ht _0  (1.626 m)   Wt 194 lb 3.2 oz (88.1 kg)   SpO2 97%   BMI 33.33 kg/m  ECOG: 0  Physical Exam Constitutional:      General: She is not in acute distress.    Appearance: She is not diaphoretic.  HENT:     Head: Normocephalic and atraumatic.  Cardiovascular:     Rate and Rhythm: Normal rate and regular rhythm.     Heart sounds: Normal heart sounds. No murmur. No friction rub. No gallop.   Pulmonary:     Effort: Pulmonary effort is normal. No respiratory distress.     Breath sounds: Normal breath sounds. No wheezing or rales.  Abdominal:     General: Bowel sounds are normal. There is no distension.     Tenderness: There is no abdominal tenderness. There is no guarding.  Skin:    General: Skin is warm and dry.     Findings: No erythema or rash.  Neurological:     Mental Status: She is alert.     Gait: Gait normal.     Lab Review:     Component Value Date/Time   NA 143 07/18/2018 1611   K 4.4 07/18/2018 1611   CL 104 07/18/2018 1611   CO2 28 07/18/2018 1611   GLUCOSE 118 (H) 07/18/2018 1611   BUN 13 07/18/2018 1611   CREATININE 0.75 07/18/2018 1611   CALCIUM 9.9 07/18/2018 1611   PROT 7.1 07/18/2018 1611   ALBUMIN 4.0 07/18/2018 1611   AST 20 07/18/2018 1611   ALT 42 07/18/2018 1611   ALKPHOS 168 (H) 07/18/2018 1611   BILITOT 0.5 07/18/2018 1611   GFRNONAA >60 07/18/2018 1611   GFRAA >60 07/18/2018 1611        Component Value Date/Time   WBC 5.5 07/18/2018 1611   WBC 9.1 07/08/2018 0924   RBC 4.02 07/18/2018 1611   HGB 13.0 07/18/2018 1611   HCT 40.2 07/18/2018 1611   PLT 247 07/18/2018 1611   MCV 100.0 07/18/2018 1611   MCH 32.3 07/18/2018 1611   MCHC 32.3 07/18/2018 1611   RDW 14.2 07/18/2018 1611   LYMPHSABS 2.3 07/18/2018 1611   MONOABS 0.4 07/18/2018 1611   EOSABS 0.2 07/18/2018 1611   BASOSABS 0.0 07/18/2018 1611   -------------------------------  Imaging from last 24 hours (if applicable):  Radiology interpretation: US Renal  Result Date: 06/25/2018 CLINICAL DATA:  Acute renal failure EXAM: RENAL / URINARY TRACT ULTRASOUND COMPLETE COMPARISON:  CT 04/27/2018 FINDINGS: Right Kidney: Renal measurements: 12.8 x 5.2 x 4.5 cm = volume: 156.2 mL . Echogenicity within normal limits. No mass or hydronephrosis visualized. Left Kidney: Renal measurements: 11.7 x 5.4 x 5.9 cm = volume: 195 mL. Echogenicity within normal limits. No mass or hydronephrosis visualized. Bladder: Appears normal for degree of bladder distention. IMPRESSION: No acute process or explanation for acute renal failure. Electronically Signed   By: Abigail Miyamoto M.D.   On: 06/25/2018 22:29

## 2018-07-21 NOTE — Telephone Encounter (Signed)
-----   Message from Heath Lark, MD sent at 07/21/2018  2:53 PM EST ----- Regarding: chemo and follow-up Can you call and ask how she is doing? Does she need any more IVF today? She needs more appt to follow. Currently next chemo is due on 1/27. I recommend IVF again on days 3 and 4 after chemo Please let me know what she decides so we can schedule appt

## 2018-07-21 NOTE — Telephone Encounter (Signed)
Called with below message. She does not feel like she needs IVF's at this time. She is feeling better. Eating and drinking more. Still complaining of low energy. She is agreeable to schedule next chemo for 1/27 and IVF's on day 3 and 4 after chemo. She can come for labs and see Dr. Alvy Bimler on 1/24 if she needs to split day.

## 2018-07-22 ENCOUNTER — Telehealth: Payer: Self-pay

## 2018-07-22 ENCOUNTER — Other Ambulatory Visit: Payer: Self-pay | Admitting: Hematology and Oncology

## 2018-07-22 MED ORDER — OXYCODONE-ACETAMINOPHEN 5-325 MG PO TABS: 2.0000 | ORAL_TABLET | Freq: Four times a day (QID) | ORAL | 0 refills | Status: DC | PRN

## 2018-07-22 NOTE — Telephone Encounter (Signed)
She called and left a message to call her.  Called back. She needs a refill sent to [facility] for the Percocet.

## 2018-07-22 NOTE — Telephone Encounter (Signed)
Order sent. I am not here on 1/24 but I'm allowing scheduler to overbook me

## 2018-07-22 NOTE — Telephone Encounter (Signed)
done

## 2018-07-23 ENCOUNTER — Other Ambulatory Visit: Payer: Self-pay | Admitting: Hematology and Oncology

## 2018-07-25 ENCOUNTER — Telehealth: Payer: Self-pay | Admitting: Oncology

## 2018-07-25 MED ORDER — HYDRALAZINE HCL 50 MG PO TABS: 50.0000 mg | ORAL_TABLET | Freq: Three times a day (TID) | ORAL | 1 refills | Status: DC

## 2018-07-25 NOTE — Telephone Encounter (Signed)
If she agrees, increase hydralazine to 50 mg TID PO Please call in prescription for 1 month plus 1 refill

## 2018-07-25 NOTE — Telephone Encounter (Signed)
Called Jordan Martinez back and she would like to increase Hydralazine to 50 mg TID.  Prescription sent to [facility] per Dr. Alvy Bimler.

## 2018-07-25 NOTE — Addendum Note (Signed)
Addended by: Jacqulyn Liner on: 07/25/2018 01:36 PM   Modules accepted: Orders

## 2018-07-25 NOTE — Telephone Encounter (Signed)
Jordan Martinez called and asked about the schedule for her next chemotherapy infusion.  Advised her that it is still pending.  She also said she has been having headaches since Friday and that her BP has been elevated.  She wanted to let Dr. Alvy Bimler know what they were:  157/91 on Friday 167/98 on Saturday 161/90 on Sunday 156/90 today  She is taking hydralazine 25 mg TID as directed.

## 2018-07-28 ENCOUNTER — Telehealth: Payer: Self-pay | Admitting: Hematology and Oncology

## 2018-07-28 NOTE — Telephone Encounter (Signed)
Scheduled appt per 1/17 sch message - pt is aware of appt date and time   

## 2018-07-29 ENCOUNTER — Other Ambulatory Visit: Payer: Self-pay | Admitting: Medical

## 2018-07-29 ENCOUNTER — Telehealth: Payer: Self-pay | Admitting: Oncology

## 2018-07-29 DIAGNOSIS — N390 Urinary tract infection, site not specified: Secondary | ICD-10-CM

## 2018-07-29 MED ORDER — CIPROFLOXACIN HCL 500 MG PO TABS: 250.0000 mg | ORAL_TABLET | Freq: Two times a day (BID) | ORAL | 0 refills | Status: AC

## 2018-07-29 NOTE — Telephone Encounter (Addendum)
Jordan Martinez called and thinks she has a UTI.  It started this morning with burning, malaise, pressure, bladder spasms and "blood from her urethra."  Called Jordan Mealy, PA and discussed patient's symptoms.  He said he will treat empirically with Cipro 250 mg BID for 5 days.  Jordan Martinez and she verbalized agreement.

## 2018-07-30 ENCOUNTER — Other Ambulatory Visit: Payer: Self-pay | Admitting: Hematology and Oncology

## 2018-08-01 ENCOUNTER — Inpatient Hospital Stay: Payer: Medicare Other

## 2018-08-01 ENCOUNTER — Other Ambulatory Visit: Payer: Self-pay | Admitting: Hematology and Oncology

## 2018-08-01 ENCOUNTER — Telehealth: Payer: Self-pay | Admitting: Hematology and Oncology

## 2018-08-01 ENCOUNTER — Encounter: Payer: Self-pay | Admitting: Hematology and Oncology

## 2018-08-01 ENCOUNTER — Inpatient Hospital Stay (HOSPITAL_BASED_OUTPATIENT_CLINIC_OR_DEPARTMENT_OTHER): Payer: Medicare Other | Admitting: Hematology and Oncology

## 2018-08-01 VITALS — BP 137/66 | HR 95 | Temp 98.4°F | Resp 18 | Ht 64.0 in | Wt 194.8 lb

## 2018-08-01 DIAGNOSIS — I10 Essential (primary) hypertension: Secondary | ICD-10-CM | POA: Diagnosis not present

## 2018-08-01 DIAGNOSIS — Z95828 Presence of other vascular implants and grafts: Secondary | ICD-10-CM

## 2018-08-01 DIAGNOSIS — R748 Abnormal levels of other serum enzymes: Secondary | ICD-10-CM | POA: Insufficient documentation

## 2018-08-01 DIAGNOSIS — N3289 Other specified disorders of bladder: Secondary | ICD-10-CM

## 2018-08-01 DIAGNOSIS — R300 Dysuria: Secondary | ICD-10-CM

## 2018-08-01 DIAGNOSIS — C541 Malignant neoplasm of endometrium: Secondary | ICD-10-CM

## 2018-08-01 DIAGNOSIS — Z7982 Long term (current) use of aspirin: Secondary | ICD-10-CM

## 2018-08-01 DIAGNOSIS — E1165 Type 2 diabetes mellitus with hyperglycemia: Secondary | ICD-10-CM

## 2018-08-01 DIAGNOSIS — E860 Dehydration: Secondary | ICD-10-CM

## 2018-08-01 DIAGNOSIS — T451X5A Adverse effect of antineoplastic and immunosuppressive drugs, initial encounter: Secondary | ICD-10-CM

## 2018-08-01 DIAGNOSIS — E119 Type 2 diabetes mellitus without complications: Secondary | ICD-10-CM

## 2018-08-01 DIAGNOSIS — Z79899 Other long term (current) drug therapy: Secondary | ICD-10-CM

## 2018-08-01 DIAGNOSIS — R112 Nausea with vomiting, unspecified: Secondary | ICD-10-CM

## 2018-08-01 DIAGNOSIS — R110 Nausea: Secondary | ICD-10-CM

## 2018-08-01 DIAGNOSIS — R945 Abnormal results of liver function studies: Secondary | ICD-10-CM

## 2018-08-01 DIAGNOSIS — Z7984 Long term (current) use of oral hypoglycemic drugs: Secondary | ICD-10-CM

## 2018-08-01 LAB — CBC WITH DIFFERENTIAL/PLATELET
Abs Immature Granulocytes: 0.03 10*3/uL (ref 0.00–0.07)
Basophils Absolute: 0.1 10*3/uL (ref 0.0–0.1)
Basophils Relative: 1 %
Eosinophils Absolute: 0.4 10*3/uL (ref 0.0–0.5)
Eosinophils Relative: 5 %
HCT: 37.7 % (ref 36.0–46.0)
Hemoglobin: 12.4 g/dL (ref 12.0–15.0)
Immature Granulocytes: 0 %
Lymphocytes Relative: 20 %
Lymphs Abs: 1.5 10*3/uL (ref 0.7–4.0)
MCH: 33.1 pg (ref 26.0–34.0)
MCHC: 32.9 g/dL (ref 30.0–36.0)
MCV: 100.5 fL — ABNORMAL HIGH (ref 80.0–100.0)
Monocytes Absolute: 0.8 10*3/uL (ref 0.1–1.0)
Monocytes Relative: 11 %
Neutro Abs: 4.6 10*3/uL (ref 1.7–7.7)
Neutrophils Relative %: 63 %
Platelets: 278 10*3/uL (ref 150–400)
RBC: 3.75 MIL/uL — ABNORMAL LOW (ref 3.87–5.11)
RDW: 16.4 % — ABNORMAL HIGH (ref 11.5–15.5)
WBC: 7.4 10*3/uL (ref 4.0–10.5)
nRBC: 0 % (ref 0.0–0.2)

## 2018-08-01 LAB — COMPREHENSIVE METABOLIC PANEL
ALT: 266 U/L — AB (ref 0–44)
AST: 75 U/L — ABNORMAL HIGH (ref 15–41)
Albumin: 3.2 g/dL — ABNORMAL LOW (ref 3.5–5.0)
Alkaline Phosphatase: 370 U/L — ABNORMAL HIGH (ref 38–126)
Anion gap: 11 (ref 5–15)
BUN: 12 mg/dL (ref 8–23)
CO2: 25 mmol/L (ref 22–32)
Calcium: 9.4 mg/dL (ref 8.9–10.3)
Chloride: 104 mmol/L (ref 98–111)
Creatinine, Ser: 0.84 mg/dL (ref 0.44–1.00)
GFR calc Af Amer: >60 mL/min (ref >60)
GFR calc non Af Amer: >60 mL/min (ref >60)
Glucose, Bld: 358 mg/dL — ABNORMAL HIGH (ref 70–99)
Potassium: 3.8 mmol/L (ref 3.5–5.1)
Sodium: 140 mmol/L (ref 135–145)
Total Bilirubin: 0.8 mg/dL (ref 0.3–1.2)
Total Protein: 7 g/dL (ref 6.5–8.1)

## 2018-08-01 LAB — URINALYSIS, COMPLETE (UACMP) WITH MICROSCOPIC
Bilirubin Urine: NEGATIVE
Glucose, UA: >=500 mg/dL — AB
Hgb urine dipstick: NEGATIVE
Ketones, ur: NEGATIVE mg/dL
Leukocytes, UA: NEGATIVE
Nitrite: NEGATIVE
Protein, ur: 30 mg/dL — AB
Specific Gravity, Urine: 1.028 (ref 1.005–1.030)
pH: 5 (ref 5.0–8.0)

## 2018-08-01 MED ORDER — HEPARIN SOD (PORK) LOCK FLUSH 100 UNIT/ML IV SOLN
500.0000 [IU] | Freq: Once | INTRAVENOUS | Status: AC | PRN
  Administered 2018-08-01: 500 [IU]
  Filled 2018-08-01: qty 5

## 2018-08-01 MED ORDER — SODIUM CHLORIDE 0.9% FLUSH
10.0000 mL | INTRAVENOUS | Status: DC | PRN
  Administered 2018-08-01: 10 mL
  Filled 2018-08-01: qty 10

## 2018-08-01 MED ORDER — LOSARTAN POTASSIUM 50 MG PO TABS: 50.0000 mg | ORAL_TABLET | Freq: Every day | ORAL | 11 refills | Status: DC

## 2018-08-01 NOTE — Assessment & Plan Note (Addendum)
She continues to tolerate treatment very poorly, complicated by severe nausea, vomiting, dehydration, excessive fatigue, bladder spasm and others We will hold treatment today and rescheduled to allow extra week of recovery At the time of dictation, she is noted to have abnormal liver enzymes which I think is precipitated by poorly controlled diabetes I plan to recheck her blood work again at the end of the week before resumption of treatment next week.  We discussed chemotherapy dosing modification We discussed either omission of Taxol completely, reducing Taxol by 50% or omitting of Taxol for 1 cycle and then resume Ultimately, she agreed to delay chemo by 1 week and reducing Taxol by 50%

## 2018-08-01 NOTE — Assessment & Plan Note (Signed)
She has signs and symptoms of recurrent UTI I will order urinalysis and urine culture She was started on empiric antibiotics with ciprofloxacin last week

## 2018-08-01 NOTE — Telephone Encounter (Signed)
Gave avs and calendar waiting on approval for 2/3 chemo

## 2018-08-01 NOTE — Assessment & Plan Note (Signed)
She has uncontrolled hypertension despite medication adjustment I will start her on ARB in the evening

## 2018-08-01 NOTE — Assessment & Plan Note (Signed)
She has grossly elevated liver enzymes, could be due to Taxol or poorly controlled diabetes We will cancel her chemotherapy this week and reschedule I plan to reduce the dose of Taxol as above

## 2018-08-01 NOTE — Assessment & Plan Note (Signed)
She has profound, severe chemotherapy induced nausea She will continue antiemetics and IV fluids for several days after each cycle of treatment

## 2018-08-01 NOTE — Progress Notes (Signed)
[place] OFFICE PROGRESS NOTE  Patient Care Team: Javier Glazier, MD as PCP - General (Internal Medicine)  ASSESSMENT & PLAN:  Endometrial cancer [facility]) She continues to tolerate treatment very poorly, complicated by severe nausea, vomiting, dehydration, excessive fatigue, bladder spasm and others We will hold treatment today and rescheduled to allow extra week of recovery At the time of dictation, she is noted to have abnormal liver enzymes which I think is precipitated by poorly controlled diabetes I plan to recheck her blood work again at the end of the week before resumption of treatment next week.  We discussed chemotherapy dosing modification We discussed either omission of Taxol completely, reducing Taxol by 50% or omitting of Taxol for 1 cycle and then resume Ultimately, she agreed to delay chemo by 1 week and reducing Taxol by 50%  Diabetes mellitus without complication ([place]) She has poorly controlled diabetes Her abnormal liver function test could be due to hepatic congestion I will discuss further medication management with her in her next visit  Elevated liver enzymes She has grossly elevated liver enzymes, could be due to Taxol or poorly controlled diabetes We will cancel her chemotherapy this week and reschedule I plan to reduce the dose of Taxol as above  Chemotherapy-induced nausea She has profound, severe chemotherapy induced nausea She will continue antiemetics and IV fluids for several days after each cycle of treatment  Essential hypertension She has uncontrolled hypertension despite medication adjustment I will start her on ARB in the evening  Dysuria She has signs and symptoms of recurrent UTI I will order urinalysis and urine culture She was started on empiric antibiotics with ciprofloxacin last week  Port-A-Cath in place She has significant skin reaction with chlorhexidine I will enter the as medication allergy and we will try to  avoid chlorhexidine in the future   No orders of the defined types were placed in this encounter.   INTERVAL HISTORY: Please see below for problem oriented charting. She returns to be seen prior to cycle 3 of chemotherapy. 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 Endometrium, biopsy - HIGH GRADE CARCINOMA, SEE COMMENT. 2. Cervix, biopsy - UNREMARKABLE ENDOCERVICAL GLANDULAR AND SQUAMOUS MUCOSA. - MULTIPLE DETACHED FRAGMENTS OF HIGH GRADE CARCINOMA, MORPHOLOGICALLY SIMILAR TO SPECIMEN 1. Microscopic Comment 1. 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    04/21/2018 Imaging    US pelvis Abnormally thickened endometrium measuring up to 16 mm. In the setting of post-menopausal bleeding, endometrial sampling is indicated to exclude carcinoma. If results are benign, sonohysterogram should be considered for focal lesion work-up.     04/27/2018 Imaging    Ct abdomen and pelvis 1. Thickening of the endometrium identified compatible with endometrial carcinoma. 2. No findings to suggest metastatic adenopathy or distant metastatic disease. 3. Hiatal hernia 4.  Aortic Atherosclerosis (ICD10-I70.0).  05/02/2018 Tumor Marker    Patient's tumor was tested for the following markers:  CA-125 Results of the tumor marker test revealed 65.3    05/03/2018 Pathology Results    1. Lymph node, sentinel, biopsy, right obturator - ONE OF ONE LYMPH NODES NEGATIVE FOR CARCINOMA (0/1). 2. Lymph node, sentinel, biopsy, left external iliac - FOUR OF FOUR LYMPH NODES NEGATIVE FOR CARCINOMA (0/4). 3. 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 TUMOR INVADES LESS THAN ONE HALF OF THE MYOMETRIUM. SEE ONCOLOGY TABLE. -SEROSA: UNREMARKABLE. NO MALIGNANCY. - CERVIX: BENIGN SQUAMOUS AND ENDOCERVICAL MUCOSA. NO DYSPLASIA OR MALIGNANCY. - BILATERAL OVARIES: INCLUSION CYSTS. NO MALIGNANCY. - BILATERAL FALLOPIAN TUBES: UNREMARKABLE. NO MALIGNANCY. Microscopic Comment 3. UTERUS, CARCINOMA OR CARCINOSARCOMA Procedure: Total hysterectomy with bilateral salpingo-oophorectomy. Right obturator and left external iliac lymph node biopsies. Histologic type: Endometrioid adenocarcinoma with squamous differentiation. Histologic Grade: FIGO grade I. Myometrial invasion: Depth of invasion: 3 mm Myometrial thickness: 12 mm Uterine Serosa Involvement: Not identified. Cervical stromal involvement: Not identified. Extent of involvement of other organs: Uninvolved. Lymphovascular invasion: Not identified. Regional Lymph Nodes: Examined: 5 Sentinel 0 Non-sentinel 5 Total Lymph nodes with metastasis: 0 Isolated tumor cells (< 0.2 mm): 0 Micrometastasis: (> 0.2 mm and < 2.0 mm): 0 Macrometastasis: (> 2.0 mm): 0 Extracapsular extension: N/A. MMR / MSI testing: Will be ordered. Pathologic Stage Classification (pTNM, AJCC 8th edition): pT1a, pN0 FIGO Stage: IA Representative Tumor Block: 3D-G Comment: Pancytokeratin was performed on the lymph nodes and is negative    05/03/2018 Surgery    Operation: Robotic-assisted laparoscopic total  hysterectomy with bilateral salpingoophorectomy, SLN biopsy   Surgeon: Donaciano Eva  Operative Findings:  : 6cm normal appearing uterus. Normal tubes and ovaries. Normal omentum and diaphragm. No suspicious nodes.      05/03/2018 Pathology Results    PERITONEAL WASHING(SPECIMEN 1 OF 1 COLLECTED 05/03/18): MALIGNANT CELLS CONSISTENT WITH METASTATIC ADENOCARCINOMA.    05/18/2018 Tumor Marker    Patient's tumor was tested for the following markers: CA-125 Results of the tumor marker test revealed 77.5    05/27/2018 Genetic Testing    MSH6 c.2731C>T pathogenic variant and GATA2 c.1232C>T VUS identified on the multicancer panel.  The Multi-Gene Panel offered by [facility] includes sequencing and/or deletion duplication testing of the following 85 genes: AIP, ALK, APC, ATM, AXIN2,BAP1,  BARD1, BLM, BMPR1A, BRCA1, BRCA2, BRIP1, CASR, CDC73, CDH1, CDK4, CDKN1B, CDKN1C, CDKN2A (p14ARF), CDKN2A (p16INK4a), CEBPA, CHEK2, CTNNA1, DICER1, DIS3L2, EGFR (c.2369C>T, p.Thr790Met variant only), EPCAM (Deletion/duplication testing only), FH, FLCN, GATA2, GPC3, GREM1 (Promoter region deletion/duplication testing only), HOXB13 (c.251G>A, p.Gly84Glu), HRAS, KIT, MAX, MEN1, MET, MITF (c.952G>A, p.Glu318Lys variant only), MLH1, MSH2, MSH3, MSH6, MUTYH, NBN, NF1, NF2, NTHL1, PALB2, PDGFRA, PHOX2B, PMS2, POLD1, POLE, POT1, PRKAR1A, PTCH1, PTEN, RAD50, RAD51C, RAD51D, RB1, RECQL4, RET, RNF43, RUNX1, SDHAF2, SDHA (sequence changes only), SDHB, SDHC, SDHD, SMAD4, SMARCA4, SMARCB1, SMARCE1, STK11, SUFU, TERC, TERT, TMEM127, TP53, TSC1, TSC2, VHL, WRN and WT1.  The report date is 05/27/2018.    06/09/2018 Cancer Staging    Staging form: Corpus Uteri - Carcinoma and Carcinosarcoma, AJCC 8th Edition - Pathologic: Stage I (pT1, pN0, cM0) - Signed by Heath Lark, MD on 06/09/2018    06/15/2018 Procedure    Placement of single lumen port a cath via right internal jugular vein. The catheter tip lies at the cavo-atrial  junction. A power injectable port a cath was placed and is ready for  immediate use.     06/20/2018 -  Chemotherapy    The patient had carboplatin and Taxol    06/25/2018 - 06/27/2018 Hospital Admission    She was admitted to the hospital with deydration     REVIEW OF SYSTEMS:   Constitutional: Denies fevers, chills or abnormal weight loss Eyes: Denies blurriness of vision Ears, nose, mouth, throat, and face: Denies mucositis or sore throat Respiratory: Denies cough, dyspnea or wheezes Cardiovascular: Denies palpitation, chest discomfort or lower extremity swelling Lymphatics: Denies new lymphadenopathy or easy bruising Behavioral/Psych: Mood is stable, no new changes  All other systems were reviewed with the patient and are negative.  I have reviewed the past medical history, past surgical history, social history and family history with the patient and they are unchanged from previous note.  ALLERGIES:  is allergic to amlodipine; compazine [prochlorperazine edisylate]; gabapentin; lyrica [pregabalin]; sertraline; and chlorhexidine.  MEDICATIONS:  Current Outpatient Medications  Medication Sig Dispense Refill  . acetaminophen (TYLENOL) 325 MG tablet Take 2 tablets (650 mg total) by mouth every 6 (six) hours as needed for mild pain, fever or headache (or Fever >/= 101). 15 tablet 0  . aspirin EC 81 MG tablet Take 81 mg by mouth daily.    Marland Kitchen atenolol (TENORMIN) 100 MG tablet Take 1 tablet (100 mg total) by mouth at bedtime. For BP 30 tablet 1  . atorvastatin (LIPITOR) 20 MG tablet Take 20 mg by mouth at bedtime.   0  . cholecalciferol (VITAMIN D) 400 units TABS tablet Take 400 Units by mouth daily.    . ciprofloxacin (CIPRO) 500 MG tablet Take 0.5 tablets (250 mg total) by mouth 2 (two) times daily for 5 days. 10 tablet 0  . cyclobenzaprine (FLEXERIL) 5 MG tablet Take 5 mg by mouth 3 (three) times daily.     Marland Kitchen dexamethasone (DECADRON) 4 MG tablet Take 2 tabs at the night before and 2  tab the morning of chemotherapy, every 3 weeks, by mouth 36 tablet 0  . hydrALAZINE (APRESOLINE) 50 MG tablet Take 1 tablet (50 mg total) by mouth 3 (three) times daily. 90 tablet 1  . lidocaine-prilocaine (EMLA) cream Apply to affected area once 30 g 3  . losartan (COZAAR) 50 MG tablet Take 1 tablet (50 mg total) by mouth daily. 30 tablet 11  . metFORMIN (GLUCOPHAGE) 500 MG tablet TAKE 1 TABLET BY MOUTH EVERY DAY WITH BREAKFAST 30 tablet 1  . Omega-3 Fatty Acids (FISH OIL) 1200 MG CAPS Take 1,200 mg by mouth daily.     Marland Kitchen omeprazole (PRILOSEC) 20 MG capsule Take 1 capsule (20 mg total) by mouth daily. 30 capsule 2  . ondansetron (ZOFRAN) 8 MG tablet Take 1 tablet (8 mg total) by mouth every 8 (eight) hours as needed for refractory nausea / vomiting. Start on day 3 after chemo. 15 tablet 1  . oxyCODONE-acetaminophen (PERCOCET/ROXICET) 5-325 MG tablet Take 2 tablets by mouth every 6 (six) hours as needed for moderate pain or severe pain. 60 tablet 0  . promethazine (PHENERGAN) 25 MG tablet Take 1 tablet (25 mg total) by mouth every 6 (six) hours as needed for nausea or vomiting. 30 tablet 0  . Propylene Glycol (SYSTANE BALANCE) 0.6 % SOLN Place 1 drop into both eyes every morning.    . senna-docusate (SENOKOT-S) 8.6-50 MG tablet Take 2 tablets by mouth at bedtime. 60 tablet 1   No current facility-administered medications for this visit.     PHYSICAL EXAMINATION: ECOG PERFORMANCE STATUS: 2 -  Symptomatic, <50% confined to bed  Vitals:   08/01/18 1220  BP: 137/66  Pulse: 95  Resp: 18  Temp: 98.4 F (36.9 C)  SpO2: 98%   Filed Weights   08/01/18 1220  Weight: 194 lb 12.8 oz (88.4 kg)    GENERAL:alert, no distress and comfortable SKIN: skin color, texture, turgor are normal, no rashes or significant lesions EYES: normal, Conjunctiva are pink and non-injected, sclera clear OROPHARYNX:no exudate, no erythema and lips, buccal mucosa, and tongue normal  NECK: supple, thyroid normal size,  non-tender, without nodularity LYMPH:  no palpable lymphadenopathy in the cervical, axillary or inguinal LUNGS: clear to auscultation and percussion with normal breathing effort HEART: regular rate & rhythm and no murmurs and no lower extremity edema ABDOMEN:abdomen soft, non-tender and normal bowel sounds Musculoskeletal:no cyanosis of digits and no clubbing  NEURO: alert & oriented x 3 with fluent speech, no focal motor/sensory deficits  LABORATORY DATA:  I have reviewed the data as listed    Component Value Date/Time   NA 140 08/01/2018 1128   K 3.8 08/01/2018 1128   CL 104 08/01/2018 1128   CO2 25 08/01/2018 1128   GLUCOSE 358 (H) 08/01/2018 1128   BUN 12 08/01/2018 1128   CREATININE 0.84 08/01/2018 1128   CREATININE 0.75 07/18/2018 1611   CALCIUM 9.4 08/01/2018 1128   PROT 7.0 08/01/2018 1128   ALBUMIN 3.2 (L) 08/01/2018 1128   AST 75 (H) 08/01/2018 1128   AST 20 07/18/2018 1611   ALT 266 (H) 08/01/2018 1128   ALT 42 07/18/2018 1611   ALKPHOS 370 (H) 08/01/2018 1128   BILITOT 0.8 08/01/2018 1128   BILITOT 0.5 07/18/2018 1611   GFRNONAA >60 08/01/2018 1128   GFRNONAA >60 07/18/2018 1611   GFRAA >60 08/01/2018 1128   GFRAA >60 07/18/2018 1611    No results found for: SPEP, UPEP  Lab Results  Component Value Date   WBC 7.4 08/01/2018   NEUTROABS 4.6 08/01/2018   HGB 12.4 08/01/2018   HCT 37.7 08/01/2018   MCV 100.5 (H) 08/01/2018   PLT 278 08/01/2018      Chemistry      Component Value Date/Time   NA 140 08/01/2018 1128   K 3.8 08/01/2018 1128   CL 104 08/01/2018 1128   CO2 25 08/01/2018 1128   BUN 12 08/01/2018 1128   CREATININE 0.84 08/01/2018 1128   CREATININE 0.75 07/18/2018 1611      Component Value Date/Time   CALCIUM 9.4 08/01/2018 1128   ALKPHOS 370 (H) 08/01/2018 1128   AST 75 (H) 08/01/2018 1128   AST 20 07/18/2018 1611   ALT 266 (H) 08/01/2018 1128   ALT 42 07/18/2018 1611   BILITOT 0.8 08/01/2018 1128   BILITOT 0.5 07/18/2018 1611        All questions were answered. The patient knows to call the clinic with any problems, questions or concerns. No barriers to learning was detected.  I spent 30 minutes counseling the patient face to face. The total time spent in the appointment was 40 minutes and more than 50% was on counseling and review of test results  Heath Lark, MD 08/01/2018 4:10 PM

## 2018-08-01 NOTE — Assessment & Plan Note (Signed)
She has significant skin reaction with chlorhexidine I will enter the as medication allergy and we will try to avoid chlorhexidine in the future

## 2018-08-01 NOTE — Assessment & Plan Note (Signed)
She has poorly controlled diabetes Her abnormal liver function test could be due to hepatic congestion I will discuss further medication management with her in her next visit

## 2018-08-02 ENCOUNTER — Other Ambulatory Visit: Payer: Self-pay | Admitting: Hematology and Oncology

## 2018-08-02 ENCOUNTER — Ambulatory Visit: Payer: Medicare Other

## 2018-08-02 ENCOUNTER — Telehealth: Payer: Self-pay | Admitting: Hematology and Oncology

## 2018-08-02 LAB — URINE CULTURE: Culture: NO GROWTH

## 2018-08-02 MED ORDER — METFORMIN HCL 500 MG PO TABS: 500.0000 mg | ORAL_TABLET | Freq: Two times a day (BID) | ORAL | 1 refills | Status: DC

## 2018-08-02 NOTE — Telephone Encounter (Signed)
Called regarding 2/3 infusion

## 2018-08-02 NOTE — Telephone Encounter (Signed)
I have reviewed blood test from yesterday with the patient She has significant hyperglycemia, likely due to steroids induced diabetes Since we plan to reduce a dose of Taxol, I recommend omission of oral premedication dexamethasone at home and I plan to reduce the dose of treatment IV dexamethasone before chemo We discussed dietary modification We discussed increasing the dose of metformin to 500 mg twice a day She will need repeat blood test end of the week or early next week before chemotherapy Her abnormal elevated liver enzymes are likely due to fatty liver congestion

## 2018-08-04 ENCOUNTER — Telehealth: Payer: Self-pay

## 2018-08-04 ENCOUNTER — Ambulatory Visit: Payer: Medicare Other

## 2018-08-04 NOTE — Telephone Encounter (Signed)
Called with below message. She verbalized understanding.  She is feeling so much better after taking the week off.  She will call her transport and call back with scheduling preference.

## 2018-08-04 NOTE — Telephone Encounter (Signed)
Called back. She would like appts prior to Dr. Alvy Bimler appt on 2/3. Scheduling message sent.   Complaining of redness and swelling to 2nd toe on right foot that started today. This has happened before and went away in a couple of days. Complaining of joint pain all over.   Above message given to Dr. Alvy Bimler. Instructed to take 1/2 tablet of Dexamethasone daily in am until seen by Dr. Alvy Bimler and take a picture of her toe.. She verbalized understanding.  Per patient. 1/28 am glucose- 226 and bp- 155/81. 1/28 pm glucose 222 and bp- 148/72. 1/29 am glucose- 205 and bp-142/92 and today am glucose 193 and bp-148 86. Just FYI.

## 2018-08-04 NOTE — Telephone Encounter (Signed)
-----   Message from Heath Lark, MD sent at 08/04/2018  8:09 AM EST ----- Regarding: how is she doing Can you call and ask how she is doing/ She needs repeat labs before chemo on Monday. Can she come in on Friday for labs? WOuld you prefer to see me on Friday and repeat labs and keep chemo on Monday or would she want labs on Monday?

## 2018-08-05 ENCOUNTER — Ambulatory Visit: Payer: Medicare Other

## 2018-08-08 ENCOUNTER — Inpatient Hospital Stay: Payer: Medicare Other

## 2018-08-08 ENCOUNTER — Inpatient Hospital Stay: Payer: Medicare Other | Attending: Obstetrics | Admitting: Hematology and Oncology

## 2018-08-08 ENCOUNTER — Encounter: Payer: Self-pay | Admitting: Hematology and Oncology

## 2018-08-08 DIAGNOSIS — M10071 Idiopathic gout, right ankle and foot: Secondary | ICD-10-CM | POA: Insufficient documentation

## 2018-08-08 DIAGNOSIS — Z7984 Long term (current) use of oral hypoglycemic drugs: Secondary | ICD-10-CM | POA: Insufficient documentation

## 2018-08-08 DIAGNOSIS — C541 Malignant neoplasm of endometrium: Secondary | ICD-10-CM

## 2018-08-08 DIAGNOSIS — K760 Fatty (change of) liver, not elsewhere classified: Secondary | ICD-10-CM | POA: Insufficient documentation

## 2018-08-08 DIAGNOSIS — Z95828 Presence of other vascular implants and grafts: Secondary | ICD-10-CM

## 2018-08-08 DIAGNOSIS — R748 Abnormal levels of other serum enzymes: Secondary | ICD-10-CM

## 2018-08-08 DIAGNOSIS — Z7982 Long term (current) use of aspirin: Secondary | ICD-10-CM | POA: Insufficient documentation

## 2018-08-08 DIAGNOSIS — R530 Neoplastic (malignant) related fatigue: Secondary | ICD-10-CM | POA: Diagnosis not present

## 2018-08-08 DIAGNOSIS — E0965 Drug or chemical induced diabetes mellitus with hyperglycemia: Secondary | ICD-10-CM | POA: Insufficient documentation

## 2018-08-08 DIAGNOSIS — K761 Chronic passive congestion of liver: Secondary | ICD-10-CM | POA: Diagnosis not present

## 2018-08-08 DIAGNOSIS — Z79899 Other long term (current) drug therapy: Secondary | ICD-10-CM | POA: Diagnosis not present

## 2018-08-08 DIAGNOSIS — R110 Nausea: Secondary | ICD-10-CM | POA: Diagnosis not present

## 2018-08-08 DIAGNOSIS — E119 Type 2 diabetes mellitus without complications: Secondary | ICD-10-CM

## 2018-08-08 DIAGNOSIS — Z5111 Encounter for antineoplastic chemotherapy: Secondary | ICD-10-CM | POA: Diagnosis present

## 2018-08-08 DIAGNOSIS — M109 Gout, unspecified: Secondary | ICD-10-CM | POA: Insufficient documentation

## 2018-08-08 DIAGNOSIS — I10 Essential (primary) hypertension: Secondary | ICD-10-CM | POA: Diagnosis not present

## 2018-08-08 LAB — CBC WITH DIFFERENTIAL/PLATELET
ABS IMMATURE GRANULOCYTES: 0.05 10*3/uL (ref 0.00–0.07)
Basophils Absolute: 0.1 10*3/uL (ref 0.0–0.1)
Basophils Relative: 1 %
Eosinophils Absolute: 0.3 10*3/uL (ref 0.0–0.5)
Eosinophils Relative: 3 %
HCT: 39.9 % (ref 36.0–46.0)
Hemoglobin: 12.9 g/dL (ref 12.0–15.0)
Immature Granulocytes: 0 %
Lymphocytes Relative: 26 %
Lymphs Abs: 2.9 10*3/uL (ref 0.7–4.0)
MCH: 32.7 pg (ref 26.0–34.0)
MCHC: 32.3 g/dL (ref 30.0–36.0)
MCV: 101.3 fL — ABNORMAL HIGH (ref 80.0–100.0)
Monocytes Absolute: 0.8 10*3/uL (ref 0.1–1.0)
Monocytes Relative: 7 %
Neutro Abs: 7 10*3/uL (ref 1.7–7.7)
Neutrophils Relative %: 63 %
Platelets: 377 10*3/uL (ref 150–400)
RBC: 3.94 MIL/uL (ref 3.87–5.11)
RDW: 15.5 % (ref 11.5–15.5)
WBC: 11.2 10*3/uL — ABNORMAL HIGH (ref 4.0–10.5)
nRBC: 0 % (ref 0.0–0.2)

## 2018-08-08 LAB — COMPREHENSIVE METABOLIC PANEL
ALT: 52 U/L — ABNORMAL HIGH (ref 0–44)
AST: 18 U/L (ref 15–41)
Albumin: 3.6 g/dL (ref 3.5–5.0)
Alkaline Phosphatase: 224 U/L — ABNORMAL HIGH (ref 38–126)
Anion gap: 12 (ref 5–15)
BUN: 15 mg/dL (ref 8–23)
CHLORIDE: 104 mmol/L (ref 98–111)
CO2: 25 mmol/L (ref 22–32)
Calcium: 10.3 mg/dL (ref 8.9–10.3)
Creatinine, Ser: 0.76 mg/dL (ref 0.44–1.00)
GFR calc Af Amer: >60 mL/min (ref >60)
GFR calc non Af Amer: >60 mL/min (ref >60)
Glucose, Bld: 162 mg/dL — ABNORMAL HIGH (ref 70–99)
POTASSIUM: 3.9 mmol/L (ref 3.5–5.1)
Sodium: 141 mmol/L (ref 135–145)
Total Bilirubin: 0.5 mg/dL (ref 0.3–1.2)
Total Protein: 7 g/dL (ref 6.5–8.1)

## 2018-08-08 MED ORDER — SODIUM CHLORIDE 0.9 % IV SOLN
70.0000 mg/m2 | Freq: Once | INTRAVENOUS | Status: AC
  Administered 2018-08-08: 144 mg via INTRAVENOUS
  Filled 2018-08-08: qty 24

## 2018-08-08 MED ORDER — DIPHENHYDRAMINE HCL 50 MG/ML IJ SOLN
50.0000 mg | Freq: Once | INTRAMUSCULAR | Status: AC
  Administered 2018-08-08: 50 mg via INTRAVENOUS

## 2018-08-08 MED ORDER — SODIUM CHLORIDE 0.9 % IV SOLN
Freq: Once | INTRAVENOUS | Status: AC
  Administered 2018-08-08: 10:00:00 via INTRAVENOUS
  Filled 2018-08-08: qty 250

## 2018-08-08 MED ORDER — SODIUM CHLORIDE 0.9% FLUSH
10.0000 mL | INTRAVENOUS | Status: DC | PRN
  Administered 2018-08-08: 10 mL
  Filled 2018-08-08: qty 10

## 2018-08-08 MED ORDER — FAMOTIDINE IN NACL 20-0.9 MG/50ML-% IV SOLN
INTRAVENOUS | Status: AC
  Filled 2018-08-08: qty 50

## 2018-08-08 MED ORDER — PALONOSETRON HCL INJECTION 0.25 MG/5ML
INTRAVENOUS | Status: AC
  Filled 2018-08-08: qty 5

## 2018-08-08 MED ORDER — FAMOTIDINE IN NACL 20-0.9 MG/50ML-% IV SOLN
20.0000 mg | Freq: Once | INTRAVENOUS | Status: AC
  Administered 2018-08-08: 20 mg via INTRAVENOUS

## 2018-08-08 MED ORDER — PALONOSETRON HCL INJECTION 0.25 MG/5ML
0.2500 mg | Freq: Once | INTRAVENOUS | Status: AC
  Administered 2018-08-08: 0.25 mg via INTRAVENOUS

## 2018-08-08 MED ORDER — SODIUM CHLORIDE 0.9 % IV SOLN
516.5000 mg | Freq: Once | INTRAVENOUS | Status: AC
  Administered 2018-08-08: 520 mg via INTRAVENOUS
  Filled 2018-08-08: qty 52

## 2018-08-08 MED ORDER — SODIUM CHLORIDE 0.9 % IV SOLN
Freq: Once | INTRAVENOUS | Status: AC
  Administered 2018-08-08: 11:00:00 via INTRAVENOUS
  Filled 2018-08-08: qty 5

## 2018-08-08 MED ORDER — HEPARIN SOD (PORK) LOCK FLUSH 100 UNIT/ML IV SOLN
500.0000 [IU] | Freq: Once | INTRAVENOUS | Status: AC | PRN
  Administered 2018-08-08: 500 [IU]
  Filled 2018-08-08: qty 5

## 2018-08-08 MED ORDER — DIPHENHYDRAMINE HCL 50 MG/ML IJ SOLN
INTRAMUSCULAR | Status: AC
  Filled 2018-08-08: qty 1

## 2018-08-08 NOTE — Assessment & Plan Note (Signed)
She felt better with recent 1 week break Per previous discussion, I plan to reduce the dose of Taxol further The patient has developed steroid-induced diabetes along with abnormal liver enzymes and uncontrolled hypertension She also have profound nausea with treatment and will return in 2 days for IV fluid support and IV antiemetics

## 2018-08-08 NOTE — Assessment & Plan Note (Signed)
She has recent gout attack, improved with 2 mg of dexamethasone daily.  She will continue for few more days

## 2018-08-08 NOTE — Assessment & Plan Note (Signed)
For now, she will continue metformin 500 mg twice a day I am hopeful, with dexamethasone taper, her blood sugar control will improve If not, I plan to increase metformin to 1000 mg twice a day She will continue dietary modification

## 2018-08-08 NOTE — Assessment & Plan Note (Signed)
Her blood pressure control is suboptimal We discussed changing different arms to see if her blood pressure monitor at home is accurate Due to ongoing treatment with dexamethasone, a suspension she will continue to retain some fluid and could make blood pressure management a bit tricky For now, she will continue all her blood pressure medication as scheduled If her blood pressure remains suboptimally controlled in her next discussion, we will increase her losartan to 100 mg daily

## 2018-08-08 NOTE — Patient Instructions (Signed)
   [facility] Discharge Instructions for Patients Receiving Chemotherapy  Today you received the following chemotherapy agents Taxol and Carboplatin   To help prevent nausea and vomiting after your treatment, we encourage you to take your nausea medication as directed.    If you develop nausea and vomiting that is not controlled by your nausea medication, call the clinic.   BELOW ARE SYMPTOMS THAT SHOULD BE REPORTED IMMEDIATELY:  *FEVER GREATER THAN 100.5 F  *CHILLS WITH OR WITHOUT FEVER  NAUSEA AND VOMITING THAT IS NOT CONTROLLED WITH YOUR NAUSEA MEDICATION  *UNUSUAL SHORTNESS OF BREATH  *UNUSUAL BRUISING OR BLEEDING  TENDERNESS IN MOUTH AND THROAT WITH OR WITHOUT PRESENCE OF ULCERS  *URINARY PROBLEMS  *BOWEL PROBLEMS  UNUSUAL RASH Items with * indicate a potential emergency and should be followed up as soon as possible.  Feel free to call the clinic should you have any questions or concerns. The clinic phone number is [phone].  Please show the CHEMO ALERT CARD at check-in to the Emergency Department and triage nurse.   

## 2018-08-08 NOTE — Progress Notes (Signed)
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  She will continue for few more days  Essential hypertension Her blood pressure control is suboptimal We discussed changing different arms to see if her blood pressure monitor at home is accurate Due to ongoing treatment with dexamethasone, a suspension she will continue to retain some fluid and could make blood pressure management a bit tricky For now, she will continue all her blood pressure medication as scheduled If her blood pressure remains suboptimally controlled in her next discussion, we will increase her losartan to 100 mg daily  Diabetes mellitus without complication ([place]) For now, she will continue metformin 500 mg twice a day I am hopeful, with dexamethasone taper, her blood sugar control will improve If not, I plan to increase metformin to 1000 mg twice a day She will continue dietary modification   No orders of the defined types were placed in this encounter.   INTERVAL HISTORY: Please see below for problem oriented charting. She returns for further follow-up She felt better since last time I saw her, except for acute gout attack of her second toe on the right foot It improved with mild dose of dexamethasone She brought  with her blood pressure and blood sugar monitoring over the past few days Her blood pressure control is still suboptimal.  Blood sugar is elevated between 1 50-200 She is doing her best to modify her diet She denies nausea or vomiting.  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 Endometrium, biopsy - HIGH GRADE CARCINOMA, SEE COMMENT. 2. Cervix, biopsy - UNREMARKABLE ENDOCERVICAL GLANDULAR AND SQUAMOUS MUCOSA. - MULTIPLE DETACHED FRAGMENTS OF HIGH GRADE CARCINOMA, MORPHOLOGICALLY SIMILAR TO SPECIMEN 1. Microscopic Comment 1. 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    04/21/2018 Imaging    US pelvis Abnormally thickened endometrium measuring up to 16 mm. In the setting of post-menopausal bleeding, endometrial sampling is indicated to exclude carcinoma. If results are benign, sonohysterogram should be considered for focal lesion work-up.     04/27/2018 Imaging    Ct abdomen and pelvis 1. Thickening of the endometrium identified compatible with endometrial carcinoma. 2. No findings to suggest metastatic adenopathy or distant metastatic disease. 3. Hiatal hernia 4.  Aortic Atherosclerosis (ICD10-I70.0).     05/02/2018 Tumor Marker    Patient's tumor was tested for the following markers: CA-125 Results of the tumor marker test revealed 65.3    05/03/2018 Pathology Results    1. Lymph node, sentinel, biopsy, right obturator - ONE OF ONE LYMPH  NODES NEGATIVE FOR CARCINOMA (0/1). 2. Lymph node, sentinel, biopsy, left external iliac - FOUR OF FOUR LYMPH NODES NEGATIVE FOR CARCINOMA (0/4). 3. Uterus +/- tubes/ovaries, neoplastic, cervix, bilateral tubes and  ovaries - UTERUS: -ENDO/MYOMETRIUM: INVASIVE ENDOMETRIOID ADENOCARCINOMA WITH SQUAMOUS DIFFERENTIATION, FIGO GRADE 1, SPANNING 2.2 CM. TUMOR INVADES LESS THAN ONE HALF OF THE MYOMETRIUM. SEE ONCOLOGY TABLE. -SEROSA: UNREMARKABLE. NO MALIGNANCY. - CERVIX: BENIGN SQUAMOUS AND ENDOCERVICAL MUCOSA. NO DYSPLASIA OR MALIGNANCY. - BILATERAL OVARIES: INCLUSION CYSTS. NO MALIGNANCY. - BILATERAL FALLOPIAN TUBES: UNREMARKABLE. NO MALIGNANCY. Microscopic Comment 3. UTERUS, CARCINOMA OR CARCINOSARCOMA Procedure: Total hysterectomy with bilateral salpingo-oophorectomy. Right obturator and left external iliac lymph node biopsies. Histologic type: Endometrioid adenocarcinoma with squamous differentiation. Histologic Grade: FIGO grade I. Myometrial invasion: Depth of invasion: 3 mm Myometrial thickness: 12 mm Uterine Serosa Involvement: Not identified. Cervical stromal involvement: Not identified. Extent of involvement of other organs: Uninvolved. Lymphovascular invasion: Not identified. Regional Lymph Nodes: Examined: 5 Sentinel 0 Non-sentinel 5 Total Lymph nodes with metastasis: 0 Isolated tumor cells (< 0.2 mm): 0 Micrometastasis: (> 0.2 mm and < 2.0 mm): 0 Macrometastasis: (> 2.0 mm): 0 Extracapsular extension: N/A. MMR / MSI testing: Will be ordered. Pathologic Stage Classification (pTNM, AJCC 8th edition): pT1a, pN0 FIGO Stage: IA Representative Tumor Block: 3D-G Comment: Pancytokeratin was performed on the lymph nodes and is negative    05/03/2018 Surgery    Operation: Robotic-assisted laparoscopic total hysterectomy with bilateral salpingoophorectomy, SLN biopsy   Surgeon: Donaciano Eva  Operative Findings:  : 6cm normal appearing uterus. Normal tubes and ovaries. Normal omentum and diaphragm. No suspicious nodes.      05/03/2018 Pathology Results    PERITONEAL WASHING(SPECIMEN 1 OF 1 COLLECTED 05/03/18): MALIGNANT CELLS CONSISTENT WITH METASTATIC ADENOCARCINOMA.     05/18/2018 Tumor Marker    Patient's tumor was tested for the following markers: CA-125 Results of the tumor marker test revealed 77.5    05/27/2018 Genetic Testing    MSH6 c.2731C>T pathogenic variant and GATA2 c.1232C>T VUS identified on the multicancer panel.  The Multi-Gene Panel offered by [facility] includes sequencing and/or deletion duplication testing of the following 85 genes: AIP, ALK, APC, ATM, AXIN2,BAP1,  BARD1, BLM, BMPR1A, BRCA1, BRCA2, BRIP1, CASR, CDC73, CDH1, CDK4, CDKN1B, CDKN1C, CDKN2A (p14ARF), CDKN2A (p16INK4a), CEBPA, CHEK2, CTNNA1, DICER1, DIS3L2, EGFR (c.2369C>T, p.Thr790Met variant only), EPCAM (Deletion/duplication testing only), FH, FLCN, GATA2, GPC3, GREM1 (Promoter region deletion/duplication testing only), HOXB13 (c.251G>A, p.Gly84Glu), HRAS, KIT, MAX, MEN1, MET, MITF (c.952G>A, p.Glu318Lys variant only), MLH1, MSH2, MSH3, MSH6, MUTYH, NBN, NF1, NF2, NTHL1, PALB2, PDGFRA, PHOX2B, PMS2, POLD1, POLE, POT1, PRKAR1A, PTCH1, PTEN, RAD50, RAD51C, RAD51D, RB1, RECQL4, RET, RNF43, RUNX1, SDHAF2, SDHA (sequence changes only), SDHB, SDHC, SDHD, SMAD4, SMARCA4, SMARCB1, SMARCE1, STK11, SUFU, TERC, TERT, TMEM127, TP53, TSC1, TSC2, VHL, WRN and WT1.  The report date is 05/27/2018.    06/09/2018 Cancer Staging    Staging form: Corpus Uteri - Carcinoma and Carcinosarcoma, AJCC 8th Edition - Pathologic: Stage I (pT1, pN0, cM0) - Signed by Heath Lark, MD on 06/09/2018    06/15/2018 Procedure    Placement of single lumen port a cath via right internal jugular vein. The catheter tip lies at the cavo-atrial junction. A power injectable port a cath was placed and is ready for immediate use.     06/20/2018 -  Chemotherapy    The patient had carboplatin and Taxol    06/25/2018 - 06/27/2018 Hospital Admission    She was admitted to the hospital with deydration     REVIEW OF SYSTEMS:  Constitutional: Denies fevers, chills or abnormal weight loss Eyes: Denies blurriness of  vision Ears, nose, mouth, throat, and face: Denies mucositis or sore throat Respiratory: Denies cough, dyspnea or wheezes Cardiovascular: Denies palpitation, chest discomfort or lower extremity swelling Gastrointestinal:  Denies nausea, heartburn or change in bowel habits Skin: Denies abnormal skin rashes Lymphatics: Denies new lymphadenopathy or easy bruising Neurological:Denies numbness, tingling or new weaknesses Behavioral/Psych: Mood is stable, no new changes  All other systems were reviewed with the patient and are negative.  I have reviewed the past medical history, past surgical history, social history and family history with the patient and they are unchanged from previous note.  ALLERGIES:  is allergic to amlodipine; compazine [prochlorperazine edisylate]; gabapentin; lyrica [pregabalin]; sertraline; and chlorhexidine.  MEDICATIONS:  Current Outpatient Medications  Medication Sig Dispense Refill  . acetaminophen (TYLENOL) 325 MG tablet Take 2 tablets (650 mg total) by mouth every 6 (six) hours as needed for mild pain, fever or headache (or Fever >/= 101). 15 tablet 0  . aspirin EC 81 MG tablet Take 81 mg by mouth daily.    Marland Kitchen atenolol (TENORMIN) 100 MG tablet Take 1 tablet (100 mg total) by mouth at bedtime. For BP 30 tablet 1  . atorvastatin (LIPITOR) 20 MG tablet Take 20 mg by mouth at bedtime.   0  . cholecalciferol (VITAMIN D) 400 units TABS tablet Take 400 Units by mouth daily.    . cyclobenzaprine (FLEXERIL) 5 MG tablet Take 5 mg by mouth 3 (three) times daily.     . hydrALAZINE (APRESOLINE) 50 MG tablet Take 1 tablet (50 mg total) by mouth 3 (three) times daily. 90 tablet 1  . lidocaine-prilocaine (EMLA) cream Apply to affected area once 30 g 3  . losartan (COZAAR) 50 MG tablet Take 1 tablet (50 mg total) by mouth daily. 30 tablet 11  . metFORMIN (GLUCOPHAGE) 500 MG tablet Take 1 tablet (500 mg total) by mouth 2 (two) times daily with a meal. 60 tablet 1  . Omega-3 Fatty  Acids (FISH OIL) 1200 MG CAPS Take 1,200 mg by mouth daily.     Marland Kitchen omeprazole (PRILOSEC) 20 MG capsule Take 1 capsule (20 mg total) by mouth daily. 30 capsule 2  . ondansetron (ZOFRAN) 8 MG tablet Take 1 tablet (8 mg total) by mouth every 8 (eight) hours as needed for refractory nausea / vomiting. Start on day 3 after chemo. 15 tablet 1  . oxyCODONE-acetaminophen (PERCOCET/ROXICET) 5-325 MG tablet Take 2 tablets by mouth every 6 (six) hours as needed for moderate pain or severe pain. 60 tablet 0  . promethazine (PHENERGAN) 25 MG tablet Take 1 tablet (25 mg total) by mouth every 6 (six) hours as needed for nausea or vomiting. 30 tablet 0  . Propylene Glycol (SYSTANE BALANCE) 0.6 % SOLN Place 1 drop into both eyes every morning.    . senna-docusate (SENOKOT-S) 8.6-50 MG tablet Take 2 tablets by mouth at bedtime. 60 tablet 1   No current facility-administered medications for this visit.     PHYSICAL EXAMINATION: ECOG PERFORMANCE STATUS: 1 - Symptomatic but completely ambulatory  Vitals:   08/08/18 0932  BP: 133/72  Pulse: 75  Resp: 17  Temp: 98.1 F (36.7 C)  SpO2: 97%   Filed Weights   08/08/18 0932  Weight: 194 lb (88 kg)    GENERAL:alert, no distress and comfortable SKIN: skin color, texture, turgor are normal, no rashes or significant lesions EYES: normal, Conjunctiva are pink and non-injected, sclera clear OROPHARYNX:no  exudate, no erythema and lips, buccal mucosa, and tongue normal  NECK: supple, thyroid normal size, non-tender, without nodularity LYMPH:  no palpable lymphadenopathy in the cervical, axillary or inguinal LUNGS: clear to auscultation and percussion with normal breathing effort HEART: regular rate & rhythm and no murmurs and no lower extremity edema ABDOMEN:abdomen soft, non-tender and normal bowel sounds Musculoskeletal:no cyanosis of digits and no clubbing.  Noted mild discoloration of the second toe on the right foot, in the distal interphalangeal joint  consistent with acute gout attack NEURO: alert & oriented x 3 with fluent speech, no focal motor/sensory deficits  LABORATORY DATA:  I have reviewed the data as listed    Component Value Date/Time   NA 141 08/08/2018 0839   K 3.9 08/08/2018 0839   CL 104 08/08/2018 0839   CO2 25 08/08/2018 0839   GLUCOSE 162 (H) 08/08/2018 0839   BUN 15 08/08/2018 0839   CREATININE 0.76 08/08/2018 0839   CREATININE 0.75 07/18/2018 1611   CALCIUM 10.3 08/08/2018 0839   PROT 7.0 08/08/2018 0839   ALBUMIN 3.6 08/08/2018 0839   AST 18 08/08/2018 0839   AST 20 07/18/2018 1611   ALT 52 (H) 08/08/2018 0839   ALT 42 07/18/2018 1611   ALKPHOS 224 (H) 08/08/2018 0839   BILITOT 0.5 08/08/2018 0839   BILITOT 0.5 07/18/2018 1611   GFRNONAA >60 08/08/2018 0839   GFRNONAA >60 07/18/2018 1611   GFRAA >60 08/08/2018 0839   GFRAA >60 07/18/2018 1611    No results found for: SPEP, UPEP  Lab Results  Component Value Date   WBC 11.2 (H) 08/08/2018   NEUTROABS 7.0 08/08/2018   HGB 12.9 08/08/2018   HCT 39.9 08/08/2018   MCV 101.3 (H) 08/08/2018   PLT 377 08/08/2018      Chemistry      Component Value Date/Time   NA 141 08/08/2018 0839   K 3.9 08/08/2018 0839   CL 104 08/08/2018 0839   CO2 25 08/08/2018 0839   BUN 15 08/08/2018 0839   CREATININE 0.76 08/08/2018 0839   CREATININE 0.75 07/18/2018 1611      Component Value Date/Time   CALCIUM 10.3 08/08/2018 0839   ALKPHOS 224 (H) 08/08/2018 0839   AST 18 08/08/2018 0839   AST 20 07/18/2018 1611   ALT 52 (H) 08/08/2018 0839   ALT 42 07/18/2018 1611   BILITOT 0.5 08/08/2018 0839   BILITOT 0.5 07/18/2018 1611      All questions were answered. The patient knows to call the clinic with any problems, questions or concerns. No barriers to learning was detected.  I spent 25 minutes counseling the patient face to face. The total time spent in the appointment was 30 minutes and more than 50% was on counseling and review of test results  Heath Lark,  MD 08/08/2018 10:03 AM

## 2018-08-08 NOTE — Assessment & Plan Note (Signed)
Her liver enzymes has improved It is related to fatty liver congestion from diabetes We will continue with treatment with dose reduction

## 2018-08-10 ENCOUNTER — Other Ambulatory Visit: Payer: Self-pay | Admitting: *Deleted

## 2018-08-10 ENCOUNTER — Inpatient Hospital Stay: Payer: Medicare Other

## 2018-08-10 ENCOUNTER — Ambulatory Visit: Payer: Medicare Other | Admitting: Gastroenterology

## 2018-08-10 VITALS — BP 154/68 | HR 66 | Temp 98.5°F | Resp 18

## 2018-08-10 DIAGNOSIS — Z95828 Presence of other vascular implants and grafts: Secondary | ICD-10-CM

## 2018-08-10 DIAGNOSIS — C541 Malignant neoplasm of endometrium: Secondary | ICD-10-CM | POA: Diagnosis not present

## 2018-08-10 MED ORDER — HEPARIN SOD (PORK) LOCK FLUSH 100 UNIT/ML IV SOLN
500.0000 [IU] | Freq: Once | INTRAVENOUS | Status: AC | PRN
  Administered 2018-08-10: 500 [IU]
  Filled 2018-08-10: qty 5

## 2018-08-10 MED ORDER — ALTEPLASE 2 MG IJ SOLR
INTRAMUSCULAR | Status: AC
  Filled 2018-08-10: qty 2

## 2018-08-10 MED ORDER — SODIUM CHLORIDE 0.9 % IV SOLN
Freq: Once | INTRAVENOUS | Status: AC
  Administered 2018-08-10: 14:00:00 via INTRAVENOUS
  Filled 2018-08-10: qty 250

## 2018-08-10 MED ORDER — ALTEPLASE 2 MG IJ SOLR
2.0000 mg | Freq: Once | INTRAMUSCULAR | Status: DC | PRN
  Filled 2018-08-10: qty 2

## 2018-08-10 MED ORDER — SODIUM CHLORIDE 0.9% FLUSH
10.0000 mL | Freq: Once | INTRAVENOUS | Status: AC | PRN
  Administered 2018-08-10: 10 mL
  Filled 2018-08-10: qty 10

## 2018-08-10 NOTE — Patient Instructions (Signed)

## 2018-08-11 ENCOUNTER — Telehealth: Payer: Self-pay

## 2018-08-11 ENCOUNTER — Other Ambulatory Visit: Payer: Self-pay | Admitting: Hematology and Oncology

## 2018-08-11 ENCOUNTER — Inpatient Hospital Stay: Payer: Medicare Other

## 2018-08-11 ENCOUNTER — Other Ambulatory Visit: Payer: Self-pay | Admitting: Medical Oncology

## 2018-08-11 VITALS — BP 118/64 | HR 74 | Temp 98.3°F | Resp 16

## 2018-08-11 DIAGNOSIS — C541 Malignant neoplasm of endometrium: Secondary | ICD-10-CM | POA: Diagnosis not present

## 2018-08-11 DIAGNOSIS — Z95828 Presence of other vascular implants and grafts: Secondary | ICD-10-CM

## 2018-08-11 MED ORDER — HEPARIN SOD (PORK) LOCK FLUSH 100 UNIT/ML IV SOLN
500.0000 [IU] | Freq: Once | INTRAVENOUS | Status: AC | PRN
  Administered 2018-08-11: 500 [IU]
  Filled 2018-08-11: qty 5

## 2018-08-11 MED ORDER — PROMETHAZINE HCL 25 MG/ML IJ SOLN
12.5000 mg | Freq: Every day | INTRAMUSCULAR | Status: DC
  Administered 2018-08-11: 12.5 mg via INTRAVENOUS

## 2018-08-11 MED ORDER — OXYCODONE-ACETAMINOPHEN 5-325 MG PO TABS: 2.0000 | ORAL_TABLET | Freq: Four times a day (QID) | ORAL | 0 refills | Status: DC | PRN

## 2018-08-11 MED ORDER — PROMETHAZINE HCL 25 MG/ML IJ SOLN
INTRAMUSCULAR | Status: AC
  Filled 2018-08-11: qty 1

## 2018-08-11 MED ORDER — SODIUM CHLORIDE 0.9% FLUSH
10.0000 mL | Freq: Once | INTRAVENOUS | Status: AC | PRN
  Administered 2018-08-11: 10 mL
  Filled 2018-08-11: qty 10

## 2018-08-11 MED ORDER — SODIUM CHLORIDE 0.9 % IV SOLN
Freq: Once | INTRAVENOUS | Status: AC
  Administered 2018-08-11: 13:00:00 via INTRAVENOUS
  Filled 2018-08-11: qty 250

## 2018-08-11 NOTE — Telephone Encounter (Signed)
Called and spoke with her in the infusion room. Per Dr. Alvy Bimler, Glucose and blood pressure will improve when she gets off dexamethasone. She verbalized understanding. Reminded of appt 2/8.

## 2018-08-11 NOTE — Telephone Encounter (Signed)
-----   Message from Heath Lark, MD sent at 08/11/2018  8:01 AM EST ----- Regarding: her appt today is at 1 pm Can you stop by later and ask   1) How is her toe? If better, can stop dex. If not better, continue 2) How is her BP at home? 3) does she need more IVF?

## 2018-08-11 NOTE — Patient Instructions (Signed)
Dehydration, Adult  Dehydration is when there is not enough fluid or water in your body. This happens when you lose more fluids than you take in. Dehydration can range from mild to very bad. It should be treated right away to keep it from getting very bad. Symptoms of mild dehydration may include:  Thirst.  Dry lips.  Slightly dry mouth.  Dry, warm skin.  Dizziness. Symptoms of moderate dehydration may include:  Very dry mouth.  Muscle cramps.  Dark pee (urine). Pee may be the color of tea.  Your body making less pee.  Your eyes making fewer tears.  Heartbeat that is uneven or faster than normal (palpitations).  Headache.  Light-headedness, especially when you stand up from sitting.  Fainting (syncope). Symptoms of very bad dehydration may include:  Changes in skin, such as: ? Cold and clammy skin. ? Blotchy (mottled) or pale skin. ? Skin that does not quickly return to normal after being lightly pinched and let go (poor skin turgor).  Changes in body fluids, such as: ? Feeling very thirsty. ? Your eyes making fewer tears. ? Not sweating when body temperature is high, such as in hot weather. ? Your body making very little pee.  Changes in vital signs, such as: ? Weak pulse. ? Pulse that is more than 100 beats a minute when you are sitting still. ? Fast breathing. ? Low blood pressure.  Other changes, such as: ? Sunken eyes. ? Cold hands and feet. ? Confusion. ? Lack of energy (lethargy). ? Trouble waking up from sleep. ? Short-term weight loss. ? Unconsciousness. Follow these instructions at home:   If told by your doctor, drink an ORS: ? Make an ORS by using instructions on the package. ? Start by drinking small amounts, about  cup (120 mL) every 5-10 minutes. ? Slowly drink more until you have had the amount that your doctor said to have.  Drink enough clear fluid to keep your pee clear or pale yellow. If you were told to drink an ORS, finish the  ORS first, then start slowly drinking clear fluids. Drink fluids such as: ? Water. Do not drink only water by itself. Doing that can make the salt (sodium) level in your body get too low (hyponatremia). ? Ice chips. ? Fruit juice that you have added water to (diluted). ? Low-calorie sports drinks.  Avoid: ? Alcohol. ? Drinks that have a lot of sugar. These include high-calorie sports drinks, fruit juice that does not have water added, and soda. ? Caffeine. ? Foods that are greasy or have a lot of fat or sugar.  Take over-the-counter and prescription medicines only as told by your doctor.  Do not take salt tablets. Doing that can make the salt level in your body get too high (hypernatremia).  Eat foods that have minerals (electrolytes). Examples include bananas, oranges, potatoes, tomatoes, and spinach.  Keep all follow-up visits as told by your doctor. This is important. Contact a doctor if:  You have belly (abdominal) pain that: ? Gets worse. ? Stays in one area (localizes).  You have a rash.  You have a stiff neck.  You get angry or annoyed more easily than normal (irritability).  You are more sleepy than normal.  You have a harder time waking up than normal.  You feel: ? Weak. ? Dizzy. ? Very thirsty.  You have peed (urinated) only a small amount of very dark pee during 6-8 hours. Get help right away if:  You have   symptoms of very bad dehydration.  You cannot drink fluids without throwing up (vomiting).  Your symptoms get worse with treatment.  You have a fever.  You have a very bad headache.  You are throwing up or having watery poop (diarrhea) and it: ? Gets worse. ? Does not go away.  You have blood or something green (bile) in your throw-up.  You have blood in your poop (stool). This may cause poop to look black and tarry.  You have not peed in 6-8 hours.  You pass out (faint).  Your heart rate when you are sitting still is more than 100 beats a  minute.  You have trouble breathing. This information is not intended to replace advice given to you by your health care provider. Make sure you discuss any questions you have with your health care provider. Document Released: 04/18/2009 Document Revised: 01/10/2016 Document Reviewed: 08/16/2015 Elsevier Interactive Patient Education  2019 [employer]  

## 2018-08-11 NOTE — Telephone Encounter (Signed)
Talked with her in the infusion room. Given below message. Her toe is better, she will stop dexamethasone. She gave a paper with bp readings, given to Dr. Alvy Bimler. Bp readings sent to HIM to be scanned into chart. She thinks she wants more IV fluids. Scheduling message sent for IV fluids 2/8 and 2/10.

## 2018-08-12 ENCOUNTER — Telehealth: Payer: Self-pay | Admitting: Hematology and Oncology

## 2018-08-12 NOTE — Telephone Encounter (Signed)
Scheduled appt per 2/6 sch message - pt is aware of appt date and time for 2/8 and 2/10

## 2018-08-13 ENCOUNTER — Inpatient Hospital Stay: Payer: Medicare Other

## 2018-08-13 VITALS — BP 138/83 | HR 85 | Temp 98.0°F | Resp 16

## 2018-08-13 DIAGNOSIS — Z95828 Presence of other vascular implants and grafts: Secondary | ICD-10-CM

## 2018-08-13 DIAGNOSIS — C541 Malignant neoplasm of endometrium: Secondary | ICD-10-CM | POA: Diagnosis not present

## 2018-08-13 MED ORDER — SODIUM CHLORIDE 0.9% FLUSH
10.0000 mL | Freq: Once | INTRAVENOUS | Status: AC | PRN
  Administered 2018-08-13: 10 mL
  Filled 2018-08-13: qty 10

## 2018-08-13 MED ORDER — HEPARIN SOD (PORK) LOCK FLUSH 100 UNIT/ML IV SOLN
500.0000 [IU] | Freq: Once | INTRAVENOUS | Status: AC | PRN
  Administered 2018-08-13: 500 [IU]
  Filled 2018-08-13: qty 5

## 2018-08-13 MED ORDER — SODIUM CHLORIDE 0.9 % IV SOLN
Freq: Once | INTRAVENOUS | Status: AC
  Administered 2018-08-13: 10:00:00 via INTRAVENOUS
  Filled 2018-08-13: qty 250

## 2018-08-13 NOTE — Patient Instructions (Signed)
Dehydration, Adult  Dehydration is a condition in which there is not enough fluid or water in the body. This happens when you lose more fluids than you take in. Important organs, such as the kidneys, brain, and heart, cannot function without a proper amount of fluids. Any loss of fluids from the body can lead to dehydration. Dehydration can range from mild to severe. This condition should be treated right away to prevent it from becoming severe. What are the causes? This condition may be caused by:  Vomiting.  Diarrhea.  Excessive sweating, such as from heat exposure or exercise.  Not drinking enough fluid, especially: ? When ill. ? While doing activity that requires a lot of energy.  Excessive urination.  Fever.  Infection.  Certain medicines, such as medicines that cause the body to lose excess fluid (diuretics).  Inability to access safe drinking water.  Reduced physical ability to get adequate water and food. What increases the risk? This condition is more likely to develop in people:  Who have a poorly controlled long-term (chronic) illness, such as diabetes, heart disease, or kidney disease.  Who are age 65 or older.  Who are disabled.  Who live in a place with high altitude.  Who play endurance sports. What are the signs or symptoms? Symptoms of mild dehydration may include:  Thirst.  Dry lips.  Slightly dry mouth.  Dry, warm skin.  Dizziness. Symptoms of moderate dehydration may include:  Very dry mouth.  Muscle cramps.  Dark urine. Urine may be the color of tea.  Decreased urine production.  Decreased tear production.  Heartbeat that is irregular or faster than normal (palpitations).  Headache.  Light-headedness, especially when you stand up from a sitting position.  Fainting (syncope). Symptoms of severe dehydration may include:  Changes in skin, such as: ? Cold and clammy skin. ? Blotchy (mottled) or pale skin. ? Skin that does  not quickly return to normal after being lightly pinched and released (poor skin turgor).  Changes in body fluids, such as: ? Extreme thirst. ? No tear production. ? Inability to sweat when body temperature is high, such as in hot weather. ? Very little urine production.  Changes in vital signs, such as: ? Weak pulse. ? Pulse that is more than 100 beats a minute when sitting still. ? Rapid breathing. ? Low blood pressure.  Other changes, such as: ? Sunken eyes. ? Cold hands and feet. ? Confusion. ? Lack of energy (lethargy). ? Difficulty waking up from sleep. ? Short-term weight loss. ? Unconsciousness. How is this diagnosed? This condition is diagnosed based on your symptoms and a physical exam. Blood and urine tests may be done to help confirm the diagnosis. How is this treated? Treatment for this condition depends on the severity. Mild or moderate dehydration can often be treated at home. Treatment should be started right away. Do not wait until dehydration becomes severe. Severe dehydration is an emergency and it needs to be treated in a hospital. Treatment for mild dehydration may include:  Drinking more fluids.  Replacing salts and minerals in your blood (electrolytes) that you may have lost. Treatment for moderate dehydration may include:  Drinking an oral rehydration solution (ORS). This is a drink that helps you replace fluids and electrolytes (rehydrate). It can be found at pharmacies and retail stores. Treatment for severe dehydration may include:  Receiving fluids through an IV tube.  Receiving an electrolyte solution through a feeding tube that is passed through your nose and   into your stomach (nasogastric tube, or NG tube).  Correcting any abnormalities in electrolytes.  Treating the underlying cause of dehydration. Follow these instructions at home:  If directed by your health care provider, drink an ORS: ? Make an ORS by following instructions on the  package. ? Start by drinking small amounts, about  cup (120 mL) every 5-10 minutes. ? Slowly increase how much you drink until you have taken the amount recommended by your health care provider.  Drink enough clear fluid to keep your urine clear or pale yellow. If you were told to drink an ORS, finish the ORS first, then start slowly drinking other clear fluids. Drink fluids such as: ? Water. Do not drink only water. Doing that can lead to having too little salt (sodium) in the body (hyponatremia). ? Ice chips. ? Fruit juice that you have added water to (diluted fruit juice). ? Low-calorie sports drinks.  Avoid: ? Alcohol. ? Drinks that contain a lot of sugar. These include high-calorie sports drinks, fruit juice that is not diluted, and soda. ? Caffeine. ? Foods that are greasy or contain a lot of fat or sugar.  Take over-the-counter and prescription medicines only as told by your health care provider.  Do not take sodium tablets. This can lead to having too much sodium in the body (hypernatremia).  Eat foods that contain a healthy balance of electrolytes, such as bananas, oranges, potatoes, tomatoes, and spinach.  Keep all follow-up visits as told by your health care provider. This is important. Contact a health care provider if:  You have abdominal pain that: ? Gets worse. ? Stays in one area (localizes).  You have a rash.  You have a stiff neck.  You are more irritable than usual.  You are sleepier or more difficult to wake up than usual.  You feel weak or dizzy.  You feel very thirsty.  You have urinated only a small amount of very dark urine over 6-8 hours. Get help right away if:  You have symptoms of severe dehydration.  You cannot drink fluids without vomiting.  Your symptoms get worse with treatment.  You have a fever.  You have a severe headache.  You have vomiting or diarrhea that: ? Gets worse. ? Does not go away.  You have blood or green matter  (bile) in your vomit.  You have blood in your stool. This may cause stool to look black and tarry.  You have not urinated in 6-8 hours.  You faint.  Your heart rate while sitting still is over 100 beats a minute.  You have trouble breathing. This information is not intended to replace advice given to you by your health care provider. Make sure you discuss any questions you have with your health care provider. Document Released: 06/22/2005 Document Revised: 01/17/2016 Document Reviewed: 08/16/2015 Elsevier Interactive Patient Education  2019 [employer]  

## 2018-08-15 ENCOUNTER — Other Ambulatory Visit: Payer: Self-pay | Admitting: *Deleted

## 2018-08-15 ENCOUNTER — Inpatient Hospital Stay: Payer: Medicare Other

## 2018-08-15 MED ORDER — METFORMIN HCL 500 MG PO TABS: 500.0000 mg | ORAL_TABLET | Freq: Two times a day (BID) | ORAL | 1 refills | Status: DC

## 2018-08-18 ENCOUNTER — Other Ambulatory Visit: Payer: Self-pay | Admitting: Hematology and Oncology

## 2018-08-25 ENCOUNTER — Other Ambulatory Visit: Payer: Self-pay | Admitting: Hematology and Oncology

## 2018-08-25 MED ORDER — ATENOLOL 100 MG PO TABS: 100.0000 mg | ORAL_TABLET | Freq: Every day | ORAL | 1 refills | Status: DC

## 2018-08-29 ENCOUNTER — Encounter: Payer: Self-pay | Admitting: Hematology and Oncology

## 2018-08-29 ENCOUNTER — Telehealth: Payer: Self-pay | Admitting: Oncology

## 2018-08-29 ENCOUNTER — Inpatient Hospital Stay: Payer: Medicare Other

## 2018-08-29 ENCOUNTER — Inpatient Hospital Stay (HOSPITAL_BASED_OUTPATIENT_CLINIC_OR_DEPARTMENT_OTHER): Payer: Medicare Other | Admitting: Hematology and Oncology

## 2018-08-29 ENCOUNTER — Telehealth: Payer: Self-pay | Admitting: Hematology and Oncology

## 2018-08-29 VITALS — BP 134/72 | HR 77 | Temp 98.1°F | Resp 18 | Ht 64.0 in | Wt 192.6 lb

## 2018-08-29 DIAGNOSIS — C541 Malignant neoplasm of endometrium: Secondary | ICD-10-CM | POA: Diagnosis not present

## 2018-08-29 DIAGNOSIS — R110 Nausea: Secondary | ICD-10-CM | POA: Diagnosis not present

## 2018-08-29 DIAGNOSIS — Z95828 Presence of other vascular implants and grafts: Secondary | ICD-10-CM

## 2018-08-29 DIAGNOSIS — E0965 Drug or chemical induced diabetes mellitus with hyperglycemia: Secondary | ICD-10-CM

## 2018-08-29 DIAGNOSIS — R530 Neoplastic (malignant) related fatigue: Secondary | ICD-10-CM

## 2018-08-29 DIAGNOSIS — K761 Chronic passive congestion of liver: Secondary | ICD-10-CM

## 2018-08-29 DIAGNOSIS — Z7984 Long term (current) use of oral hypoglycemic drugs: Secondary | ICD-10-CM

## 2018-08-29 DIAGNOSIS — Z79899 Other long term (current) drug therapy: Secondary | ICD-10-CM

## 2018-08-29 DIAGNOSIS — I10 Essential (primary) hypertension: Secondary | ICD-10-CM | POA: Diagnosis not present

## 2018-08-29 DIAGNOSIS — K760 Fatty (change of) liver, not elsewhere classified: Secondary | ICD-10-CM

## 2018-08-29 DIAGNOSIS — Z7982 Long term (current) use of aspirin: Secondary | ICD-10-CM

## 2018-08-29 DIAGNOSIS — R748 Abnormal levels of other serum enzymes: Secondary | ICD-10-CM

## 2018-08-29 DIAGNOSIS — M10071 Idiopathic gout, right ankle and foot: Secondary | ICD-10-CM

## 2018-08-29 DIAGNOSIS — E119 Type 2 diabetes mellitus without complications: Secondary | ICD-10-CM

## 2018-08-29 DIAGNOSIS — T451X5A Adverse effect of antineoplastic and immunosuppressive drugs, initial encounter: Secondary | ICD-10-CM

## 2018-08-29 LAB — CBC WITH DIFFERENTIAL/PLATELET
Abs Immature Granulocytes: 0.02 10*3/uL (ref 0.00–0.07)
Basophils Absolute: 0.1 10*3/uL (ref 0.0–0.1)
Basophils Relative: 1 %
EOS PCT: 0 %
Eosinophils Absolute: 0 10*3/uL (ref 0.0–0.5)
HCT: 42.6 % (ref 36.0–46.0)
Hemoglobin: 13.9 g/dL (ref 12.0–15.0)
Immature Granulocytes: 0 %
Lymphocytes Relative: 37 %
Lymphs Abs: 2.5 10*3/uL (ref 0.7–4.0)
MCH: 33.2 pg (ref 26.0–34.0)
MCHC: 32.6 g/dL (ref 30.0–36.0)
MCV: 101.7 fL — ABNORMAL HIGH (ref 80.0–100.0)
Monocytes Absolute: 0.6 10*3/uL (ref 0.1–1.0)
Monocytes Relative: 8 %
Neutro Abs: 3.6 10*3/uL (ref 1.7–7.7)
Neutrophils Relative %: 54 %
Platelets: 192 10*3/uL (ref 150–400)
RBC: 4.19 MIL/uL (ref 3.87–5.11)
RDW: 14.8 % (ref 11.5–15.5)
WBC: 6.7 10*3/uL (ref 4.0–10.5)
nRBC: 0 % (ref 0.0–0.2)

## 2018-08-29 LAB — COMPREHENSIVE METABOLIC PANEL
ALK PHOS: 158 U/L — AB (ref 38–126)
ALT: 46 U/L — AB (ref 0–44)
AST: 28 U/L (ref 15–41)
Albumin: 3.8 g/dL (ref 3.5–5.0)
Anion gap: 12 (ref 5–15)
BUN: 14 mg/dL (ref 8–23)
CO2: 24 mmol/L (ref 22–32)
CREATININE: 0.79 mg/dL (ref 0.44–1.00)
Calcium: 9.9 mg/dL (ref 8.9–10.3)
Chloride: 105 mmol/L (ref 98–111)
GFR calc Af Amer: >60 mL/min (ref >60)
GFR calc non Af Amer: >60 mL/min (ref >60)
Glucose, Bld: 191 mg/dL — ABNORMAL HIGH (ref 70–99)
Potassium: 4.1 mmol/L (ref 3.5–5.1)
Sodium: 141 mmol/L (ref 135–145)
Total Bilirubin: 0.4 mg/dL (ref 0.3–1.2)
Total Protein: 7.1 g/dL (ref 6.5–8.1)

## 2018-08-29 MED ORDER — HEPARIN SOD (PORK) LOCK FLUSH 100 UNIT/ML IV SOLN
500.0000 [IU] | Freq: Once | INTRAVENOUS | Status: AC | PRN
  Administered 2018-08-29: 500 [IU]
  Filled 2018-08-29: qty 5

## 2018-08-29 MED ORDER — ALTEPLASE 2 MG IJ SOLR
INTRAMUSCULAR | Status: AC
  Filled 2018-08-29: qty 2

## 2018-08-29 MED ORDER — SODIUM CHLORIDE 0.9% FLUSH
10.0000 mL | INTRAVENOUS | Status: DC | PRN
  Administered 2018-08-29: 10 mL
  Filled 2018-08-29: qty 10

## 2018-08-29 MED ORDER — ALTEPLASE 2 MG IJ SOLR
2.0000 mg | Freq: Once | INTRAMUSCULAR | Status: AC | PRN
  Administered 2018-08-29: 2 mg
  Filled 2018-08-29: qty 2

## 2018-08-29 NOTE — Assessment & Plan Note (Signed)
Her blood pressure control is suboptimal but stable For now, she will continue all her blood pressure medication as scheduled If her blood pressure remains suboptimally controlled in her next discussion, we will increase her losartan to 100 mg daily

## 2018-08-29 NOTE — Progress Notes (Signed)
[place] OFFICE PROGRESS NOTE  Patient Care Team: Javier Glazier, MD as PCP - General (Internal Medicine)  ASSESSMENT & PLAN:  Endometrial cancer [facility]) She continue to experience significant side effects of treatment with nausea, lack of energy and feeling unwell She is tearful today due to the prospect of continuing chemotherapy as is We discussed the risk, benefits, side effects of discontinuing Taxol and she agreed to proceed With single agent carboplatin only, I do not anticipate significant major risk of neuropathy and others We will shorten her future treatment to 2 hours only I do not believe she will have significant hair loss with single agent carboplatinum We will proceed with treatment as scheduled tomorrow and 2 more cycles I also do not anticipate significant nausea and vomiting but due to high risk of anticipatory nausea, I will schedule her to have IV fluids for 2 days after treatment  Elevated liver enzymes This is multifactorial, related to fatty liver congestion as well as Taxol With reduced dose Taxol, her liver enzymes has improved.  We will proceed with treatment as scheduled  Diabetes mellitus without complication ([place] [place]) She continues to have persistent hyperglycemia with her blood sugar usually ranging around 170 She will continue metformin as directed.  Essential hypertension Her blood pressure control is suboptimal but stable For now, she will continue all her blood pressure medication as scheduled If her blood pressure remains suboptimally controlled in her next discussion, we will increase her losartan to 100 mg daily  Chemotherapy-induced nausea She continues to have profound nausea even with significant dose reduction of treatment She will continue IV fluids and antiemetics as directed   No orders of the defined types were placed in this encounter.   INTERVAL HISTORY: Please see below for problem oriented charting. She returns  today for further follow-up She is tearful Despite significant dose changes, she continues to have excessive fatigue, nausea and feeling unwell Her blood sugar remained high around 170 at home Her blood pressure control has improved, usually around 696E to diastolic around 95M She denies vomiting. 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 Endometrium, biopsy - HIGH GRADE CARCINOMA, SEE COMMENT. 2. Cervix, biopsy - UNREMARKABLE ENDOCERVICAL GLANDULAR AND SQUAMOUS MUCOSA. - MULTIPLE DETACHED FRAGMENTS OF HIGH GRADE CARCINOMA, MORPHOLOGICALLY SIMILAR TO SPECIMEN 1. Microscopic Comment 1. 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    04/21/2018 Imaging    US pelvis Abnormally thickened endometrium measuring up to 16 mm. In the setting of post-menopausal bleeding, endometrial sampling is indicated to exclude carcinoma. If results are benign, sonohysterogram should be considered for focal lesion work-up.     04/27/2018 Imaging    Ct abdomen and pelvis 1. Thickening of the endometrium identified compatible with endometrial carcinoma. 2. No findings to suggest metastatic adenopathy or distant metastatic disease. 3. Hiatal hernia 4.  Aortic Atherosclerosis (ICD10-I70.0).     05/02/2018 Tumor Marker    Patient's tumor was tested for the following markers: CA-125 Results of the  tumor marker test revealed 65.3    05/03/2018 Pathology Results    1. Lymph node,  sentinel, biopsy, right obturator - ONE OF ONE LYMPH NODES NEGATIVE FOR CARCINOMA (0/1). 2. Lymph node, sentinel, biopsy, left external iliac - FOUR OF FOUR LYMPH NODES NEGATIVE FOR CARCINOMA (0/4). 3. 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 TUMOR INVADES LESS THAN ONE HALF OF THE MYOMETRIUM. SEE ONCOLOGY TABLE. -SEROSA: UNREMARKABLE. NO MALIGNANCY. - CERVIX: BENIGN SQUAMOUS AND ENDOCERVICAL MUCOSA. NO DYSPLASIA OR MALIGNANCY. - BILATERAL OVARIES: INCLUSION CYSTS. NO MALIGNANCY. - BILATERAL FALLOPIAN TUBES: UNREMARKABLE. NO MALIGNANCY. Microscopic Comment 3. UTERUS, CARCINOMA OR CARCINOSARCOMA Procedure: Total hysterectomy with bilateral salpingo-oophorectomy. Right obturator and left external iliac lymph node biopsies. Histologic type: Endometrioid adenocarcinoma with squamous differentiation. Histologic Grade: FIGO grade I. Myometrial invasion: Depth of invasion: 3 mm Myometrial thickness: 12 mm Uterine Serosa Involvement: Not identified. Cervical stromal involvement: Not identified. Extent of involvement of other organs: Uninvolved. Lymphovascular invasion: Not identified. Regional Lymph Nodes: Examined: 5 Sentinel 0 Non-sentinel 5 Total Lymph nodes with metastasis: 0 Isolated tumor cells (< 0.2 mm): 0 Micrometastasis: (> 0.2 mm and < 2.0 mm): 0 Macrometastasis: (> 2.0 mm): 0 Extracapsular extension: N/A. MMR / MSI testing: Will be ordered. Pathologic Stage Classification (pTNM, AJCC 8th edition): pT1a, pN0 FIGO Stage: IA Representative Tumor Block: 3D-G Comment: Pancytokeratin was performed on the lymph nodes and is negative    05/03/2018 Surgery    Operation: Robotic-assisted laparoscopic total hysterectomy with bilateral salpingoophorectomy, SLN biopsy   Surgeon: Donaciano Eva  Operative  Findings:  : 6cm normal appearing uterus. Normal tubes and ovaries. Normal omentum and diaphragm. No suspicious nodes.      05/03/2018 Pathology Results    PERITONEAL WASHING(SPECIMEN 1 OF 1 COLLECTED 05/03/18): MALIGNANT CELLS CONSISTENT WITH METASTATIC ADENOCARCINOMA.    05/18/2018 Tumor Marker    Patient's tumor was tested for the following markers: CA-125 Results of the tumor marker test revealed 77.5    05/27/2018 Genetic Testing    MSH6 c.2731C>T pathogenic variant and GATA2 c.1232C>T VUS identified on the multicancer panel.  The Multi-Gene Panel offered by [facility] includes sequencing and/or deletion duplication testing of the following 85 genes: AIP, ALK, APC, ATM, AXIN2,BAP1,  BARD1, BLM, BMPR1A, BRCA1, BRCA2, BRIP1, CASR, CDC73, CDH1, CDK4, CDKN1B, CDKN1C, CDKN2A (p14ARF), CDKN2A (p16INK4a), CEBPA, CHEK2, CTNNA1, DICER1, DIS3L2, EGFR (c.2369C>T, p.Thr790Met variant only), EPCAM (Deletion/duplication testing only), FH, FLCN, GATA2, GPC3, GREM1 (Promoter region deletion/duplication testing only), HOXB13 (c.251G>A, p.Gly84Glu), HRAS, KIT, MAX, MEN1, MET, MITF (c.952G>A, p.Glu318Lys variant only), MLH1, MSH2, MSH3, MSH6, MUTYH, NBN, NF1, NF2, NTHL1, PALB2, PDGFRA, PHOX2B, PMS2, POLD1, POLE, POT1, PRKAR1A, PTCH1, PTEN, RAD50, RAD51C, RAD51D, RB1, RECQL4, RET, RNF43, RUNX1, SDHAF2, SDHA (sequence changes only), SDHB, SDHC, SDHD, SMAD4, SMARCA4, SMARCB1, SMARCE1, STK11, SUFU, TERC, TERT, TMEM127, TP53, TSC1, TSC2, VHL, WRN and WT1.  The report date is 05/27/2018.    06/09/2018 Cancer Staging    Staging form: Corpus Uteri - Carcinoma and Carcinosarcoma, AJCC 8th Edition - Pathologic: Stage I (pT1, pN0, cM0) - Signed by Heath Lark, MD on 06/09/2018    06/15/2018 Procedure    Placement of single lumen port a cath via right internal jugular vein. The catheter tip lies at the cavo-atrial junction. A power injectable port a cath was placed and is ready for immediate use.     06/20/2018 -   Chemotherapy    The patient had carboplatin and  Taxol    06/25/2018 - 06/27/2018 Hospital Admission    She was admitted to the hospital with deydration     REVIEW OF SYSTEMS:   Constitutional: Denies fevers, chills or abnormal weight loss Eyes: Denies blurriness of vision Ears, nose, mouth, throat, and face: Denies mucositis or sore throat Respiratory: Denies cough, dyspnea or wheezes Cardiovascular: Denies palpitation, chest discomfort or lower extremity swelling Skin: Denies abnormal skin rashes Lymphatics: Denies new lymphadenopathy or easy bruising BAll other systems were reviewed with the patient and are negative.  I have reviewed the past medical history, past surgical history, social history and family history with the patient and they are unchanged from previous note.  ALLERGIES:  is allergic to amlodipine; compazine [prochlorperazine edisylate]; gabapentin; lyrica [pregabalin]; sertraline; and chlorhexidine.  MEDICATIONS:  Current Outpatient Medications  Medication Sig Dispense Refill  . acetaminophen (TYLENOL) 325 MG tablet Take 2 tablets (650 mg total) by mouth every 6 (six) hours as needed for mild pain, fever or headache (or Fever >/= 101). 15 tablet 0  . aspirin EC 81 MG tablet Take 81 mg by mouth daily.    Marland Kitchen atenolol (TENORMIN) 100 MG tablet Take 1 tablet (100 mg total) by mouth at bedtime. For BP 30 tablet 1  . atorvastatin (LIPITOR) 20 MG tablet Take 20 mg by mouth at bedtime.   0  . cholecalciferol (VITAMIN D) 400 units TABS tablet Take 400 Units by mouth daily.    . cyclobenzaprine (FLEXERIL) 5 MG tablet Take 5 mg by mouth 3 (three) times daily.     . hydrALAZINE (APRESOLINE) 50 MG tablet TAKE 1 TABLET BY MOUTH THREE TIMES A DAY 90 tablet 1  . lidocaine-prilocaine (EMLA) cream Apply to affected area once 30 g 3  . losartan (COZAAR) 50 MG tablet Take 1 tablet (50 mg total) by mouth daily. 30 tablet 11  . metFORMIN (GLUCOPHAGE) 500 MG tablet Take 1 tablet (500 mg  total) by mouth 2 (two) times daily with a meal. 60 tablet 1  . Omega-3 Fatty Acids (FISH OIL) 1200 MG CAPS Take 1,200 mg by mouth daily.     Marland Kitchen omeprazole (PRILOSEC) 20 MG capsule Take 1 capsule (20 mg total) by mouth daily. 30 capsule 2  . ondansetron (ZOFRAN) 8 MG tablet Take 1 tablet (8 mg total) by mouth every 8 (eight) hours as needed for refractory nausea / vomiting. Start on day 3 after chemo. 15 tablet 1  . oxyCODONE-acetaminophen (PERCOCET/ROXICET) 5-325 MG tablet Take 2 tablets by mouth every 6 (six) hours as needed for moderate pain or severe pain. 60 tablet 0  . promethazine (PHENERGAN) 25 MG tablet Take 1 tablet (25 mg total) by mouth every 6 (six) hours as needed for nausea or vomiting. 30 tablet 0  . Propylene Glycol (SYSTANE BALANCE) 0.6 % SOLN Place 1 drop into both eyes every morning.    . senna-docusate (SENOKOT-S) 8.6-50 MG tablet Take 2 tablets by mouth at bedtime. 60 tablet 1   Current Facility-Administered Medications  Medication Dose Route Frequency Provider Last Rate Last Dose  . sodium chloride flush (NS) 0.9 % injection 10 mL  10 mL Intracatheter PRN Alvy Bimler, Chasten Blaze, MD   10 mL at 08/29/18 1343    PHYSICAL EXAMINATION: ECOG PERFORMANCE STATUS: 1 - Symptomatic but completely ambulatory  Vitals:   08/29/18 1228  BP: 134/72  Pulse: 77  Resp: 18  Temp: 98.1 F (36.7 C)  SpO2: 100%   Filed Weights   08/29/18 1228  Weight: 192 lb  9.6 oz (87.4 kg)    GENERAL:alert, no distress and comfortable SKIN: skin color, texture, turgor are normal, no rashes or significant lesions EYES: normal, Conjunctiva are pink and non-injected, sclera clear OROPHARYNX:no exudate, no erythema and lips, buccal mucosa, and tongue normal.  Poor dentition is noted NECK: supple, thyroid normal size, non-tender, without nodularity LYMPH:  no palpable lymphadenopathy in the cervical, axillary or inguinal LUNGS: clear to auscultation and percussion with normal breathing effort HEART: regular  rate & rhythm and no murmurs and no lower extremity edema ABDOMEN:abdomen soft, non-tender and normal bowel sounds Musculoskeletal:no cyanosis of digits and no clubbing  NEURO: alert & oriented x 3 with fluent speech, no focal motor/sensory deficits  LABORATORY DATA:  I have reviewed the data as listed    Component Value Date/Time   NA 141 08/29/2018 1116   K 4.1 08/29/2018 1116   CL 105 08/29/2018 1116   CO2 24 08/29/2018 1116   GLUCOSE 191 (H) 08/29/2018 1116   BUN 14 08/29/2018 1116   CREATININE 0.79 08/29/2018 1116   CREATININE 0.75 07/18/2018 1611   CALCIUM 9.9 08/29/2018 1116   PROT 7.1 08/29/2018 1116   ALBUMIN 3.8 08/29/2018 1116   AST 28 08/29/2018 1116   AST 20 07/18/2018 1611   ALT 46 (H) 08/29/2018 1116   ALT 42 07/18/2018 1611   ALKPHOS 158 (H) 08/29/2018 1116   BILITOT 0.4 08/29/2018 1116   BILITOT 0.5 07/18/2018 1611   GFRNONAA >60 08/29/2018 1116   GFRNONAA >60 07/18/2018 1611   GFRAA >60 08/29/2018 1116   GFRAA >60 07/18/2018 1611    No results found for: SPEP, UPEP  Lab Results  Component Value Date   WBC 6.7 08/29/2018   NEUTROABS 3.6 08/29/2018   HGB 13.9 08/29/2018   HCT 42.6 08/29/2018   MCV 101.7 (H) 08/29/2018   PLT 192 08/29/2018      Chemistry      Component Value Date/Time   NA 141 08/29/2018 1116   K 4.1 08/29/2018 1116   CL 105 08/29/2018 1116   CO2 24 08/29/2018 1116   BUN 14 08/29/2018 1116   CREATININE 0.79 08/29/2018 1116   CREATININE 0.75 07/18/2018 1611      Component Value Date/Time   CALCIUM 9.9 08/29/2018 1116   ALKPHOS 158 (H) 08/29/2018 1116   AST 28 08/29/2018 1116   AST 20 07/18/2018 1611   ALT 46 (H) 08/29/2018 1116   ALT 42 07/18/2018 1611   BILITOT 0.4 08/29/2018 1116   BILITOT 0.5 07/18/2018 1611       All questions were answered. The patient knows to call the clinic with any problems, questions or concerns. No barriers to learning was detected.  I spent 30 minutes counseling the patient face to face.  The total time spent in the appointment was 40 minutes and more than 50% was on counseling and review of test results  Heath Lark, MD 08/29/2018 1:51 PM

## 2018-08-29 NOTE — Assessment & Plan Note (Signed)
She continues to have persistent hyperglycemia with her blood sugar usually ranging around 170 She will continue metformin as directed.

## 2018-08-29 NOTE — Assessment & Plan Note (Signed)
She continue to experience significant side effects of treatment with nausea, lack of energy and feeling unwell She is tearful today due to the prospect of continuing chemotherapy as is We discussed the risk, benefits, side effects of discontinuing Taxol and she agreed to proceed With single agent carboplatin only, I do not anticipate significant major risk of neuropathy and others We will shorten her future treatment to 2 hours only I do not believe she will have significant hair loss with single agent carboplatinum We will proceed with treatment as scheduled tomorrow and 2 more cycles I also do not anticipate significant nausea and vomiting but due to high risk of anticipatory nausea, I will schedule her to have IV fluids for 2 days after treatment

## 2018-08-29 NOTE — Telephone Encounter (Signed)
Jordan Martinez called and said her bridge broke this morning.  She is at the dentist and they have done x-rays that show that her tooth is broken, loose and has some infection in it.  Her dentist is wondering if it is OK to remove the tooth.  Advised her that the tooth can come out per Dr. Alvy Bimler but that chemo will need to be delayed 7-10 days for healing and that we will call her with the new appointments. The dental hygienist also asked if they should give her antibiotics.  They think they can remove the infection with the extraction.  Advised her that Dr. Alvy Bimler is leaving the antibiotic decision up to the dentist.

## 2018-08-29 NOTE — Assessment & Plan Note (Signed)
This is multifactorial, related to fatty liver congestion as well as Taxol With reduced dose Taxol, her liver enzymes has improved.  We will proceed with treatment as scheduled

## 2018-08-29 NOTE — Telephone Encounter (Signed)
Gave avs and calendar ° °

## 2018-08-29 NOTE — Assessment & Plan Note (Signed)
She continues to have profound nausea even with significant dose reduction of treatment She will continue IV fluids and antiemetics as directed

## 2018-08-30 ENCOUNTER — Inpatient Hospital Stay: Payer: Medicare Other

## 2018-08-30 ENCOUNTER — Telehealth: Payer: Self-pay | Admitting: Hematology and Oncology

## 2018-08-30 NOTE — Telephone Encounter (Signed)
Scheduled appt per 2/25 sch message- unable to reach pt . Left message for patient with appt date and time

## 2018-09-01 ENCOUNTER — Ambulatory Visit: Payer: Medicare Other

## 2018-09-02 ENCOUNTER — Ambulatory Visit: Payer: Medicare Other

## 2018-09-06 ENCOUNTER — Telehealth: Payer: Self-pay

## 2018-09-06 ENCOUNTER — Other Ambulatory Visit: Payer: Self-pay | Admitting: Hematology and Oncology

## 2018-09-06 NOTE — Telephone Encounter (Signed)
1. No 2. Yes, from chemo 3. Minimum hair loss with Norma Fredrickson only but if she wants to continue that is fine

## 2018-09-06 NOTE — Telephone Encounter (Signed)
Called back and given below message. She verbalized understanding. She would like to continue with dignicap with carboplatin.  Called and spoke with Threasa Beards, AD, she will look at schedule and make make changes to appt if needed.

## 2018-09-06 NOTE — Telephone Encounter (Signed)
She called and left a message.  Called back. She wanted to clarify 3/6 appts. Her questions: 1. Since she has been taking taxol her hot flashes have gotten better and now they have gotten worse since she stopped Taxol. Could the Taxol helped with her hot flashes? 2. She has lines on her nail beds. Is that caused by the chemo? 3. She called the [employer] representative because she has 3 tickets left and was trying to get a refund. She was told Carboplatin can cause hair loss and to continue Dignicap.

## 2018-09-09 ENCOUNTER — Inpatient Hospital Stay (HOSPITAL_BASED_OUTPATIENT_CLINIC_OR_DEPARTMENT_OTHER): Payer: Medicare Other | Admitting: Hematology and Oncology

## 2018-09-09 ENCOUNTER — Inpatient Hospital Stay: Payer: Medicare Other

## 2018-09-09 ENCOUNTER — Inpatient Hospital Stay: Payer: Medicare Other | Attending: Obstetrics

## 2018-09-09 ENCOUNTER — Encounter: Payer: Self-pay | Admitting: Hematology and Oncology

## 2018-09-09 DIAGNOSIS — R110 Nausea: Secondary | ICD-10-CM | POA: Diagnosis not present

## 2018-09-09 DIAGNOSIS — E119 Type 2 diabetes mellitus without complications: Secondary | ICD-10-CM | POA: Diagnosis not present

## 2018-09-09 DIAGNOSIS — Z452 Encounter for adjustment and management of vascular access device: Secondary | ICD-10-CM | POA: Insufficient documentation

## 2018-09-09 DIAGNOSIS — C541 Malignant neoplasm of endometrium: Secondary | ICD-10-CM

## 2018-09-09 DIAGNOSIS — R748 Abnormal levels of other serum enzymes: Secondary | ICD-10-CM

## 2018-09-09 DIAGNOSIS — Z5111 Encounter for antineoplastic chemotherapy: Secondary | ICD-10-CM | POA: Diagnosis not present

## 2018-09-09 DIAGNOSIS — T451X5A Adverse effect of antineoplastic and immunosuppressive drugs, initial encounter: Secondary | ICD-10-CM

## 2018-09-09 DIAGNOSIS — Z95828 Presence of other vascular implants and grafts: Secondary | ICD-10-CM

## 2018-09-09 LAB — CBC WITH DIFFERENTIAL/PLATELET
ABS IMMATURE GRANULOCYTES: 0.01 10*3/uL (ref 0.00–0.07)
Basophils Absolute: 0.1 10*3/uL (ref 0.0–0.1)
Basophils Relative: 1 %
Eosinophils Absolute: 0.1 10*3/uL (ref 0.0–0.5)
Eosinophils Relative: 1 %
HCT: 42.2 % (ref 36.0–46.0)
Hemoglobin: 13.9 g/dL (ref 12.0–15.0)
Immature Granulocytes: 0 %
Lymphocytes Relative: 41 %
Lymphs Abs: 2.8 10*3/uL (ref 0.7–4.0)
MCH: 33.5 pg (ref 26.0–34.0)
MCHC: 32.9 g/dL (ref 30.0–36.0)
MCV: 101.7 fL — ABNORMAL HIGH (ref 80.0–100.0)
MONO ABS: 0.7 10*3/uL (ref 0.1–1.0)
Monocytes Relative: 11 %
Neutro Abs: 3.1 10*3/uL (ref 1.7–7.7)
Neutrophils Relative %: 46 %
Platelets: 265 10*3/uL (ref 150–400)
RBC: 4.15 MIL/uL (ref 3.87–5.11)
RDW: 14.6 % (ref 11.5–15.5)
WBC: 6.8 10*3/uL (ref 4.0–10.5)
nRBC: 0 % (ref 0.0–0.2)

## 2018-09-09 LAB — COMPREHENSIVE METABOLIC PANEL
ALT: 20 U/L (ref 0–44)
AST: 26 U/L (ref 15–41)
Albumin: 4.4 g/dL (ref 3.5–5.0)
Alkaline Phosphatase: 117 U/L (ref 38–126)
Anion gap: 8 (ref 5–15)
BILIRUBIN TOTAL: 0.7 mg/dL (ref 0.3–1.2)
BUN: 12 mg/dL (ref 8–23)
CHLORIDE: 107 mmol/L (ref 98–111)
CO2: 27 mmol/L (ref 22–32)
Calcium: 9.2 mg/dL (ref 8.9–10.3)
Creatinine, Ser: 0.72 mg/dL (ref 0.44–1.00)
GFR calc Af Amer: >60 mL/min (ref >60)
Glucose, Bld: 114 mg/dL — ABNORMAL HIGH (ref 70–99)
Potassium: 3.6 mmol/L (ref 3.5–5.1)
Sodium: 142 mmol/L (ref 135–145)
Total Protein: 7.2 g/dL (ref 6.5–8.1)

## 2018-09-09 MED ORDER — HEPARIN SOD (PORK) LOCK FLUSH 100 UNIT/ML IV SOLN
500.0000 [IU] | Freq: Once | INTRAVENOUS | Status: AC | PRN
  Administered 2018-09-09: 500 [IU]
  Filled 2018-09-09: qty 5

## 2018-09-09 MED ORDER — PALONOSETRON HCL INJECTION 0.25 MG/5ML
0.2500 mg | Freq: Once | INTRAVENOUS | Status: AC
  Administered 2018-09-09: 0.25 mg via INTRAVENOUS

## 2018-09-09 MED ORDER — SODIUM CHLORIDE 0.9 % IV SOLN
516.5000 mg | Freq: Once | INTRAVENOUS | Status: AC
  Administered 2018-09-09: 520 mg via INTRAVENOUS
  Filled 2018-09-09: qty 52

## 2018-09-09 MED ORDER — SODIUM CHLORIDE 0.9% FLUSH
10.0000 mL | INTRAVENOUS | Status: DC | PRN
  Administered 2018-09-09: 10 mL
  Filled 2018-09-09: qty 10

## 2018-09-09 MED ORDER — SODIUM CHLORIDE 0.9 % IV SOLN
Freq: Once | INTRAVENOUS | Status: AC
  Administered 2018-09-09: 15:00:00 via INTRAVENOUS
  Filled 2018-09-09: qty 250

## 2018-09-09 MED ORDER — CYCLOBENZAPRINE HCL 5 MG PO TABS: 5.0000 mg | ORAL_TABLET | Freq: Three times a day (TID) | ORAL | 1 refills | Status: DC

## 2018-09-09 MED ORDER — ALTEPLASE 2 MG IJ SOLR
2.0000 mg | Freq: Once | INTRAMUSCULAR | Status: AC | PRN
  Administered 2018-09-09: 2 mg
  Filled 2018-09-09: qty 2

## 2018-09-09 MED ORDER — ALTEPLASE 2 MG IJ SOLR
INTRAMUSCULAR | Status: AC
  Filled 2018-09-09: qty 2

## 2018-09-09 MED ORDER — SODIUM CHLORIDE 0.9 % IV SOLN
Freq: Once | INTRAVENOUS | Status: AC
  Administered 2018-09-09: 15:00:00 via INTRAVENOUS
  Filled 2018-09-09: qty 5

## 2018-09-09 NOTE — Patient Instructions (Signed)
[facility] Discharge Instructions for Patients Receiving Chemotherapy  Today you received the following chemotherapy agent: Carboplatin.   To help prevent nausea and vomiting after your treatment, we encourage you to take your nausea medication as directed.   If you develop nausea and vomiting that is not controlled by your nausea medication, call the clinic.   BELOW ARE SYMPTOMS THAT SHOULD BE REPORTED IMMEDIATELY:  *FEVER GREATER THAN 100.5 F  *CHILLS WITH OR WITHOUT FEVER  NAUSEA AND VOMITING THAT IS NOT CONTROLLED WITH YOUR NAUSEA MEDICATION  *UNUSUAL SHORTNESS OF BREATH  *UNUSUAL BRUISING OR BLEEDING  TENDERNESS IN MOUTH AND THROAT WITH OR WITHOUT PRESENCE OF ULCERS  *URINARY PROBLEMS  *BOWEL PROBLEMS  UNUSUAL RASH Items with * indicate a potential emergency and should be followed up as soon as possible.  Feel free to call the clinic should you have any questions or concerns. The clinic phone number is [phone].  Please show the [facility] at check-in to the Emergency Department and triage nurse.

## 2018-09-09 NOTE — Progress Notes (Signed)
Able to get blood return from port at 2 hour recheck after cathflo administered.  Rest of cathflo drawn out of port.  Port will be deaccessed when pt leaves after infusion today.  Pt's carboplatin/premeds were administered through PIV, tolerated well.  RN Elmyra Ricks for MD Alvy Bimler made aware of this.  Inbasket sent to MD Alvy Bimler as well so she can follow up.  Pt denies any concerns/complaints at this time.

## 2018-09-09 NOTE — Assessment & Plan Note (Signed)
Based on previous extensive discussion, she is in agreement to stop Taxol and proceed with carboplatin only We will resume treatment today I will see her back in 3 weeks for further follow-up

## 2018-09-09 NOTE — Assessment & Plan Note (Signed)
She has significant concern of chemotherapy induced nausea She is scheduled for antiemetics and IV fluid next week With omission of Taxol, I suspect her risk of nausea is minimum

## 2018-09-09 NOTE — Assessment & Plan Note (Signed)
Her blood sugar control has been satisfactory She will continue dietary modification and metformin 

## 2018-09-09 NOTE — Assessment & Plan Note (Signed)
This is likely due to chemotherapy Since we have given her extra week of break for dental procedure, her liver enzymes are now normal

## 2018-09-09 NOTE — Progress Notes (Signed)
[facility] OFFICE PROGRESS NOTE  Patient Care Team: Javier Glazier, MD as PCP - General (Internal Medicine)  ASSESSMENT & PLAN:  Endometrial cancer [facility]) Based on previous extensive discussion, she is in agreement to stop Taxol and proceed with carboplatin only We will resume treatment today I will see her back in 3 weeks for further follow-up  Chemotherapy-induced nausea She has significant concern of chemotherapy induced nausea She is scheduled for antiemetics and IV fluid next week With omission of Taxol, I suspect her risk of nausea is minimum  Elevated liver enzymes This is likely due to chemotherapy Since we have given her extra week of break for dental procedure, her liver enzymes are now normal  Diabetes mellitus without complication ([place]) Her blood sugar control has been satisfactory She will continue dietary modification and metformin   No orders of the defined types were placed in this encounter.   INTERVAL HISTORY: Please see below for problem oriented charting. 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 Endometrium, biopsy - HIGH GRADE CARCINOMA, SEE COMMENT. 2. Cervix, biopsy - UNREMARKABLE ENDOCERVICAL GLANDULAR AND SQUAMOUS MUCOSA. - MULTIPLE DETACHED FRAGMENTS OF HIGH GRADE CARCINOMA, MORPHOLOGICALLY SIMILAR TO SPECIMEN 1. Microscopic Comment 1. 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    04/21/2018 Imaging    US pelvis Abnormally thickened  endometrium measuring up to 16 mm. In the setting of post-menopausal bleeding, endometrial sampling is indicated to exclude carcinoma. If results are benign, sonohysterogram should be considered for focal lesion work-up.     04/27/2018 Imaging    Ct abdomen and pelvis 1. Thickening of the endometrium identified compatible with endometrial carcinoma. 2. No findings to suggest metastatic adenopathy or distant metastatic disease. 3. Hiatal hernia 4.  Aortic Atherosclerosis (ICD10-I70.0).     05/02/2018 Tumor Marker    Patient's tumor was tested for the following markers: CA-125 Results of the tumor marker test revealed 65.3    05/03/2018 Pathology Results    1. Lymph node, sentinel, biopsy, right obturator - ONE OF ONE LYMPH NODES NEGATIVE FOR CARCINOMA (0/1). 2. Lymph node, sentinel, biopsy, left external iliac - FOUR OF FOUR LYMPH NODES NEGATIVE FOR CARCINOMA (0/4). 3. 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 TUMOR INVADES LESS THAN ONE HALF OF THE MYOMETRIUM. SEE ONCOLOGY TABLE. -SEROSA: UNREMARKABLE. NO MALIGNANCY. - CERVIX: BENIGN SQUAMOUS AND ENDOCERVICAL MUCOSA. NO DYSPLASIA OR MALIGNANCY. - BILATERAL OVARIES: INCLUSION CYSTS. NO MALIGNANCY. - BILATERAL FALLOPIAN TUBES: UNREMARKABLE. NO MALIGNANCY. Microscopic Comment 3. UTERUS, CARCINOMA OR CARCINOSARCOMA Procedure: Total hysterectomy with bilateral salpingo-oophorectomy. Right obturator and left external iliac lymph node biopsies. Histologic type: Endometrioid adenocarcinoma with squamous differentiation. Histologic Grade: FIGO grade I. Myometrial invasion: Depth of invasion: 3 mm Myometrial thickness: 12 mm Uterine Serosa Involvement: Not identified. Cervical stromal involvement: Not identified. Extent of involvement of other organs: Uninvolved. Lymphovascular  invasion: Not identified. Regional Lymph Nodes: Examined: 5 Sentinel 0 Non-sentinel 5 Total Lymph nodes with  metastasis: 0 Isolated tumor cells (< 0.2 mm): 0 Micrometastasis: (> 0.2 mm and < 2.0 mm): 0 Macrometastasis: (> 2.0 mm): 0 Extracapsular extension: N/A. MMR / MSI testing: Will be ordered. Pathologic Stage Classification (pTNM, AJCC 8th edition): pT1a, pN0 FIGO Stage: IA Representative Tumor Block: 3D-G Comment: Pancytokeratin was performed on the lymph nodes and is negative    05/03/2018 Surgery    Operation: Robotic-assisted laparoscopic total hysterectomy with bilateral salpingoophorectomy, SLN biopsy   Surgeon: Donaciano Eva  Operative Findings:  : 6cm normal appearing uterus. Normal tubes and ovaries. Normal omentum and diaphragm. No suspicious nodes.      05/03/2018 Pathology Results    PERITONEAL WASHING(SPECIMEN 1 OF 1 COLLECTED 05/03/18): MALIGNANT CELLS CONSISTENT WITH METASTATIC ADENOCARCINOMA.    05/18/2018 Tumor Marker    Patient's tumor was tested for the following markers: CA-125 Results of the tumor marker test revealed 77.5    05/27/2018 Genetic Testing    MSH6 c.2731C>T pathogenic variant and GATA2 c.1232C>T VUS identified on the multicancer panel.  The Multi-Gene Panel offered by [facility] includes sequencing and/or deletion duplication testing of the following 85 genes: AIP, ALK, APC, ATM, AXIN2,BAP1,  BARD1, BLM, BMPR1A, BRCA1, BRCA2, BRIP1, CASR, CDC73, CDH1, CDK4, CDKN1B, CDKN1C, CDKN2A (p14ARF), CDKN2A (p16INK4a), CEBPA, CHEK2, CTNNA1, DICER1, DIS3L2, EGFR (c.2369C>T, p.Thr790Met variant only), EPCAM (Deletion/duplication testing only), FH, FLCN, GATA2, GPC3, GREM1 (Promoter region deletion/duplication testing only), HOXB13 (c.251G>A, p.Gly84Glu), HRAS, KIT, MAX, MEN1, MET, MITF (c.952G>A, p.Glu318Lys variant only), MLH1, MSH2, MSH3, MSH6, MUTYH, NBN, NF1, NF2, NTHL1, PALB2, PDGFRA, PHOX2B, PMS2, POLD1, POLE, POT1, PRKAR1A, PTCH1, PTEN, RAD50,  RAD51C, RAD51D, RB1, RECQL4, RET, RNF43, RUNX1, SDHAF2, SDHA (sequence changes only), SDHB, SDHC, SDHD, SMAD4, SMARCA4, SMARCB1, SMARCE1, STK11, SUFU, TERC, TERT, TMEM127, TP53, TSC1, TSC2, VHL, WRN and WT1.  The report date is 05/27/2018.    06/09/2018 Cancer Staging    Staging form: Corpus Uteri - Carcinoma and Carcinosarcoma, AJCC 8th Edition - Pathologic: Stage I (pT1, pN0, cM0) - Signed by Heath Lark, MD on 06/09/2018    06/15/2018 Procedure    Placement of single lumen port a cath via right internal jugular vein. The catheter tip lies at the cavo-atrial junction. A power injectable port a cath was placed and is ready for immediate use.     06/20/2018 -  Chemotherapy    The patient had carboplatin and Taxol    06/25/2018 - 06/27/2018 Hospital Admission    She was admitted to the hospital with deydration     REVIEW OF SYSTEMS:   Constitutional: Denies fevers, chills or abnormal weight loss Eyes: Denies blurriness of vision Ears, nose, mouth, throat, and face: Denies mucositis or sore throat Respiratory: Denies cough, dyspnea or wheezes Cardiovascular: Denies palpitation, chest discomfort or lower extremity swelling Gastrointestinal:  Denies nausea, heartburn or change in bowel habits Skin: Denies abnormal skin rashes Lymphatics: Denies new lymphadenopathy or easy bruising Neurological:Denies numbness, tingling or new weaknesses Behavioral/Psych: Mood is stable, no new changes  All other systems were reviewed with the patient and are negative.  I have reviewed the past medical history, past surgical history, social history and family history with the patient and they are unchanged from previous note.  ALLERGIES:  is allergic to amlodipine; compazine [prochlorperazine edisylate]; gabapentin; lyrica [pregabalin]; sertraline; and chlorhexidine.  MEDICATIONS:  Current Outpatient Medications  Medication Sig Dispense Refill  . acetaminophen (TYLENOL) 325 MG tablet Take 2 tablets  (650 mg total) by mouth every 6 (six) hours as needed for mild pain, fever or headache (  or Fever >/= 101). 15 tablet 0  . aspirin EC 81 MG tablet Take 81 mg by mouth daily.    Marland Kitchen atenolol (TENORMIN) 100 MG tablet Take 1 tablet (100 mg total) by mouth at bedtime. For BP 30 tablet 1  . atorvastatin (LIPITOR) 20 MG tablet Take 20 mg by mouth at bedtime.   0  . cholecalciferol (VITAMIN D) 400 units TABS tablet Take 400 Units by mouth daily.    . cyclobenzaprine (FLEXERIL) 5 MG tablet Take 1 tablet (5 mg total) by mouth 3 (three) times daily. 90 tablet 1  . hydrALAZINE (APRESOLINE) 50 MG tablet TAKE 1 TABLET BY MOUTH THREE TIMES A DAY 90 tablet 1  . lidocaine-prilocaine (EMLA) cream Apply to affected area once 30 g 3  . losartan (COZAAR) 50 MG tablet Take 1 tablet (50 mg total) by mouth daily. 30 tablet 11  . metFORMIN (GLUCOPHAGE) 500 MG tablet TAKE 1 TABLET BY MOUTH TWICE A DAY WITH MEALS 60 tablet 1  . Omega-3 Fatty Acids (FISH OIL) 1200 MG CAPS Take 1,200 mg by mouth daily.     Marland Kitchen omeprazole (PRILOSEC) 20 MG capsule Take 1 capsule (20 mg total) by mouth daily. 30 capsule 2  . ondansetron (ZOFRAN) 8 MG tablet Take 1 tablet (8 mg total) by mouth every 8 (eight) hours as needed for refractory nausea / vomiting. Start on day 3 after chemo. 15 tablet 1  . oxyCODONE-acetaminophen (PERCOCET/ROXICET) 5-325 MG tablet Take 2 tablets by mouth every 6 (six) hours as needed for moderate pain or severe pain. 60 tablet 0  . promethazine (PHENERGAN) 25 MG tablet Take 1 tablet (25 mg total) by mouth every 6 (six) hours as needed for nausea or vomiting. 30 tablet 0  . Propylene Glycol (SYSTANE BALANCE) 0.6 % SOLN Place 1 drop into both eyes every morning.    . senna-docusate (SENOKOT-S) 8.6-50 MG tablet Take 2 tablets by mouth at bedtime. 60 tablet 1   No current facility-administered medications for this visit.     PHYSICAL EXAMINATION: ECOG PERFORMANCE STATUS: 1 - Symptomatic but completely ambulatory  Vitals:    09/09/18 1349  BP: 134/70  Pulse: 77  Resp: 18  Temp: 98.2 F (36.8 C)  SpO2: 100%   Filed Weights   09/09/18 1349  Weight: 195 lb 12.8 oz (88.8 kg)    GENERAL:alert, no distress and comfortable SKIN: skin color, texture, turgor are normal, no rashes or significant lesions EYES: normal, Conjunctiva are pink and non-injected, sclera clear OROPHARYNX:no exudate, no erythema and lips, buccal mucosa, and tongue normal  NECK: supple, thyroid normal size, non-tender, without nodularity LYMPH:  no palpable lymphadenopathy in the cervical, axillary or inguinal LUNGS: clear to auscultation and percussion with normal breathing effort HEART: regular rate & rhythm and no murmurs and no lower extremity edema ABDOMEN:abdomen soft, non-tender and normal bowel sounds Musculoskeletal:no cyanosis of digits and no clubbing  NEURO: alert & oriented x 3 with fluent speech, no focal motor/sensory deficits  LABORATORY DATA:  I have reviewed the data as listed    Component Value Date/Time   NA 142 09/09/2018 1257   K 3.6 09/09/2018 1257   CL 107 09/09/2018 1257   CO2 27 09/09/2018 1257   GLUCOSE 114 (H) 09/09/2018 1257   BUN 12 09/09/2018 1257   CREATININE 0.72 09/09/2018 1257   CREATININE 0.75 07/18/2018 1611   CALCIUM 9.2 09/09/2018 1257   PROT 7.2 09/09/2018 1257   ALBUMIN 4.4 09/09/2018 1257   AST 26 09/09/2018  1257   AST 20 07/18/2018 1611   ALT 20 09/09/2018 1257   ALT 42 07/18/2018 1611   ALKPHOS 117 09/09/2018 1257   BILITOT 0.7 09/09/2018 1257   BILITOT 0.5 07/18/2018 1611   GFRNONAA >60 09/09/2018 1257   GFRNONAA >60 07/18/2018 1611   GFRAA >60 09/09/2018 1257   GFRAA >60 07/18/2018 1611    No results found for: SPEP, UPEP  Lab Results  Component Value Date   WBC 6.8 09/09/2018   NEUTROABS 3.1 09/09/2018   HGB 13.9 09/09/2018   HCT 42.2 09/09/2018   MCV 101.7 (H) 09/09/2018   PLT 265 09/09/2018      Chemistry      Component Value Date/Time   NA 142 09/09/2018  1257   K 3.6 09/09/2018 1257   CL 107 09/09/2018 1257   CO2 27 09/09/2018 1257   BUN 12 09/09/2018 1257   CREATININE 0.72 09/09/2018 1257   CREATININE 0.75 07/18/2018 1611      Component Value Date/Time   CALCIUM 9.2 09/09/2018 1257   ALKPHOS 117 09/09/2018 1257   AST 26 09/09/2018 1257   AST 20 07/18/2018 1611   ALT 20 09/09/2018 1257   ALT 42 07/18/2018 1611   BILITOT 0.7 09/09/2018 1257   BILITOT 0.5 07/18/2018 1611       All questions were answered. The patient knows to call the clinic with any problems, questions or concerns. No barriers to learning was detected.  I spent 15 minutes counseling the patient face to face. The total time spent in the appointment was 20 minutes and more than 50% was on counseling and review of test results  Heath Lark, MD 09/09/2018 2:49 PM

## 2018-09-12 ENCOUNTER — Inpatient Hospital Stay: Payer: Medicare Other

## 2018-09-12 VITALS — BP 127/61 | HR 80 | Temp 98.0°F | Resp 18 | Wt 195.8 lb

## 2018-09-12 DIAGNOSIS — Z95828 Presence of other vascular implants and grafts: Secondary | ICD-10-CM

## 2018-09-12 DIAGNOSIS — Z5111 Encounter for antineoplastic chemotherapy: Secondary | ICD-10-CM | POA: Diagnosis not present

## 2018-09-12 MED ORDER — PROMETHAZINE HCL 25 MG/ML IJ SOLN: 12.5000 mg | Freq: Every day | INTRAMUSCULAR | Status: DC

## 2018-09-12 MED ORDER — SODIUM CHLORIDE 0.9 % IV SOLN
Freq: Once | INTRAVENOUS | Status: AC
  Administered 2018-09-12: 16:00:00 via INTRAVENOUS
  Filled 2018-09-12: qty 250

## 2018-09-12 NOTE — Patient Instructions (Signed)
Dehydration, Adult  Dehydration is when there is not enough fluid or water in your body. This happens when you lose more fluids than you take in. Dehydration can range from mild to very bad. It should be treated right away to keep it from getting very bad. Symptoms of mild dehydration may include:  Thirst.  Dry lips.  Slightly dry mouth.  Dry, warm skin.  Dizziness. Symptoms of moderate dehydration may include:  Very dry mouth.  Muscle cramps.  Dark pee (urine). Pee may be the color of tea.  Your body making less pee.  Your eyes making fewer tears.  Heartbeat that is uneven or faster than normal (palpitations).  Headache.  Light-headedness, especially when you stand up from sitting.  Fainting (syncope). Symptoms of very bad dehydration may include:  Changes in skin, such as: ? Cold and clammy skin. ? Blotchy (mottled) or pale skin. ? Skin that does not quickly return to normal after being lightly pinched and let go (poor skin turgor).  Changes in body fluids, such as: ? Feeling very thirsty. ? Your eyes making fewer tears. ? Not sweating when body temperature is high, such as in hot weather. ? Your body making very little pee.  Changes in vital signs, such as: ? Weak pulse. ? Pulse that is more than 100 beats a minute when you are sitting still. ? Fast breathing. ? Low blood pressure.  Other changes, such as: ? Sunken eyes. ? Cold hands and feet. ? Confusion. ? Lack of energy (lethargy). ? Trouble waking up from sleep. ? Short-term weight loss. ? Unconsciousness. Follow these instructions at home:   If told by your doctor, drink an ORS: ? Make an ORS by using instructions on the package. ? Start by drinking small amounts, about  cup (120 mL) every 5-10 minutes. ? Slowly drink more until you have had the amount that your doctor said to have.  Drink enough clear fluid to keep your pee clear or pale yellow. If you were told to drink an ORS, finish the  ORS first, then start slowly drinking clear fluids. Drink fluids such as: ? Water. Do not drink only water by itself. Doing that can make the salt (sodium) level in your body get too low (hyponatremia). ? Ice chips. ? Fruit juice that you have added water to (diluted). ? Low-calorie sports drinks.  Avoid: ? Alcohol. ? Drinks that have a lot of sugar. These include high-calorie sports drinks, fruit juice that does not have water added, and soda. ? Caffeine. ? Foods that are greasy or have a lot of fat or sugar.  Take over-the-counter and prescription medicines only as told by your doctor.  Do not take salt tablets. Doing that can make the salt level in your body get too high (hypernatremia).  Eat foods that have minerals (electrolytes). Examples include bananas, oranges, potatoes, tomatoes, and spinach.  Keep all follow-up visits as told by your doctor. This is important. Contact a doctor if:  You have belly (abdominal) pain that: ? Gets worse. ? Stays in one area (localizes).  You have a rash.  You have a stiff neck.  You get angry or annoyed more easily than normal (irritability).  You are more sleepy than normal.  You have a harder time waking up than normal.  You feel: ? Weak. ? Dizzy. ? Very thirsty.  You have peed (urinated) only a small amount of very dark pee during 6-8 hours. Get help right away if:  You have   symptoms of very bad dehydration.  You cannot drink fluids without throwing up (vomiting).  Your symptoms get worse with treatment.  You have a fever.  You have a very bad headache.  You are throwing up or having watery poop (diarrhea) and it: ? Gets worse. ? Does not go away.  You have blood or something green (bile) in your throw-up.  You have blood in your poop (stool). This may cause poop to look black and tarry.  You have not peed in 6-8 hours.  You pass out (faint).  Your heart rate when you are sitting still is more than 100 beats a  minute.  You have trouble breathing. This information is not intended to replace advice given to you by your health care provider. Make sure you discuss any questions you have with your health care provider. Document Released: 04/18/2009 Document Revised: 01/10/2016 Document Reviewed: 08/16/2015 Elsevier Interactive Patient Education  2019 [employer]  

## 2018-09-13 ENCOUNTER — Inpatient Hospital Stay: Payer: Medicare Other

## 2018-09-13 VITALS — BP 133/72 | HR 78 | Temp 98.0°F | Resp 18

## 2018-09-13 DIAGNOSIS — Z5111 Encounter for antineoplastic chemotherapy: Secondary | ICD-10-CM | POA: Diagnosis not present

## 2018-09-13 DIAGNOSIS — Z95828 Presence of other vascular implants and grafts: Secondary | ICD-10-CM

## 2018-09-13 MED ORDER — SODIUM CHLORIDE 0.9% FLUSH
10.0000 mL | Freq: Once | INTRAVENOUS | Status: AC | PRN
  Administered 2018-09-13: 10 mL
  Filled 2018-09-13: qty 10

## 2018-09-13 MED ORDER — SODIUM CHLORIDE 0.9 % IV SOLN
Freq: Once | INTRAVENOUS | Status: AC
  Administered 2018-09-13: 16:00:00 via INTRAVENOUS
  Filled 2018-09-13: qty 250

## 2018-09-13 MED ORDER — HEPARIN SOD (PORK) LOCK FLUSH 100 UNIT/ML IV SOLN
500.0000 [IU] | Freq: Once | INTRAVENOUS | Status: AC | PRN
  Administered 2018-09-13: 500 [IU]
  Filled 2018-09-13: qty 5

## 2018-09-13 NOTE — Patient Instructions (Signed)

## 2018-09-14 ENCOUNTER — Other Ambulatory Visit: Payer: Self-pay | Admitting: Hematology and Oncology

## 2018-09-15 ENCOUNTER — Telehealth: Payer: Self-pay

## 2018-09-15 NOTE — Telephone Encounter (Signed)
Pt lvm stating she is preparing food for a neighbor to help her out and the neighbor wants her to stay and eat with her,however, the neighbor has also recently been treated for shingles.  Pt states she had shingles vaccine 2 yrs ago. Call returned.  LVM instructed pt that it is not advisable for her to be in close contact with anyone who has recently been treated for shingles d/t she is currently getting chemo and could have a weakened immune system.  Encouraged to call back if any further questions or concerns.

## 2018-09-19 ENCOUNTER — Other Ambulatory Visit: Payer: Medicare Other

## 2018-09-19 ENCOUNTER — Ambulatory Visit: Payer: Medicare Other | Admitting: Hematology and Oncology

## 2018-09-19 ENCOUNTER — Ambulatory Visit: Payer: Medicare Other

## 2018-09-22 ENCOUNTER — Ambulatory Visit: Payer: Medicare Other

## 2018-09-23 ENCOUNTER — Ambulatory Visit: Payer: Medicare Other

## 2018-09-23 ENCOUNTER — Telehealth: Payer: Self-pay

## 2018-09-23 ENCOUNTER — Other Ambulatory Visit: Payer: Self-pay | Admitting: Hematology and Oncology

## 2018-09-23 MED ORDER — OXYCODONE-ACETAMINOPHEN 5-325 MG PO TABS: 2.0000 | ORAL_TABLET | Freq: Four times a day (QID) | ORAL | 0 refills | Status: DC | PRN

## 2018-09-23 NOTE — Telephone Encounter (Signed)
She called requesting refill on Oxycodone, ask that it be sent to [facility] in [place] [facility].

## 2018-09-23 NOTE — Telephone Encounter (Signed)
done

## 2018-09-30 ENCOUNTER — Other Ambulatory Visit: Payer: Self-pay | Admitting: Hematology and Oncology

## 2018-10-03 ENCOUNTER — Inpatient Hospital Stay (HOSPITAL_BASED_OUTPATIENT_CLINIC_OR_DEPARTMENT_OTHER): Payer: Medicare Other | Admitting: Hematology and Oncology

## 2018-10-03 ENCOUNTER — Telehealth: Payer: Self-pay | Admitting: Hematology and Oncology

## 2018-10-03 ENCOUNTER — Other Ambulatory Visit: Payer: Self-pay

## 2018-10-03 ENCOUNTER — Inpatient Hospital Stay: Payer: Medicare Other

## 2018-10-03 ENCOUNTER — Encounter: Payer: Self-pay | Admitting: Hematology and Oncology

## 2018-10-03 DIAGNOSIS — Z7984 Long term (current) use of oral hypoglycemic drugs: Secondary | ICD-10-CM

## 2018-10-03 DIAGNOSIS — C541 Malignant neoplasm of endometrium: Secondary | ICD-10-CM

## 2018-10-03 DIAGNOSIS — Z5111 Encounter for antineoplastic chemotherapy: Secondary | ICD-10-CM | POA: Diagnosis not present

## 2018-10-03 DIAGNOSIS — E119 Type 2 diabetes mellitus without complications: Secondary | ICD-10-CM | POA: Diagnosis not present

## 2018-10-03 DIAGNOSIS — Z95828 Presence of other vascular implants and grafts: Secondary | ICD-10-CM

## 2018-10-03 DIAGNOSIS — I10 Essential (primary) hypertension: Secondary | ICD-10-CM

## 2018-10-03 DIAGNOSIS — R748 Abnormal levels of other serum enzymes: Secondary | ICD-10-CM

## 2018-10-03 LAB — COMPREHENSIVE METABOLIC PANEL
ALT: 33 U/L (ref 0–44)
ANION GAP: 14 (ref 5–15)
AST: 23 U/L (ref 15–41)
Albumin: 4.4 g/dL (ref 3.5–5.0)
Alkaline Phosphatase: 108 U/L (ref 38–126)
BILIRUBIN TOTAL: 0.5 mg/dL (ref 0.3–1.2)
BUN: 18 mg/dL (ref 8–23)
CO2: 21 mmol/L — ABNORMAL LOW (ref 22–32)
Calcium: 10 mg/dL (ref 8.9–10.3)
Chloride: 106 mmol/L (ref 98–111)
Creatinine, Ser: 0.81 mg/dL (ref 0.44–1.00)
GFR calc Af Amer: >60 mL/min (ref >60)
GFR calc non Af Amer: >60 mL/min (ref >60)
Glucose, Bld: 126 mg/dL — ABNORMAL HIGH (ref 70–99)
Potassium: 4 mmol/L (ref 3.5–5.1)
Sodium: 141 mmol/L (ref 135–145)
Total Protein: 7.7 g/dL (ref 6.5–8.1)

## 2018-10-03 LAB — CBC WITH DIFFERENTIAL/PLATELET
Abs Immature Granulocytes: 0.01 10*3/uL (ref 0.00–0.07)
BASOS PCT: 1 %
Basophils Absolute: 0 10*3/uL (ref 0.0–0.1)
Eosinophils Absolute: 0.1 10*3/uL (ref 0.0–0.5)
Eosinophils Relative: 1 %
HCT: 44.1 % (ref 36.0–46.0)
Hemoglobin: 14.3 g/dL (ref 12.0–15.0)
Immature Granulocytes: 0 %
Lymphocytes Relative: 36 %
Lymphs Abs: 2.7 10*3/uL (ref 0.7–4.0)
MCH: 33 pg (ref 26.0–34.0)
MCHC: 32.4 g/dL (ref 30.0–36.0)
MCV: 101.8 fL — ABNORMAL HIGH (ref 80.0–100.0)
Monocytes Absolute: 0.7 10*3/uL (ref 0.1–1.0)
Monocytes Relative: 9 %
Neutro Abs: 4 10*3/uL (ref 1.7–7.7)
Neutrophils Relative %: 53 %
PLATELETS: 188 10*3/uL (ref 150–400)
RBC: 4.33 MIL/uL (ref 3.87–5.11)
RDW: 14.2 % (ref 11.5–15.5)
WBC: 7.5 10*3/uL (ref 4.0–10.5)
nRBC: 0 % (ref 0.0–0.2)

## 2018-10-03 MED ORDER — SODIUM CHLORIDE 0.9% FLUSH
10.0000 mL | INTRAVENOUS | Status: DC | PRN
  Administered 2018-10-03: 10 mL
  Filled 2018-10-03: qty 10

## 2018-10-03 MED ORDER — SODIUM CHLORIDE 0.9 % IV SOLN
Freq: Once | INTRAVENOUS | Status: AC
  Administered 2018-10-03: 12:00:00 via INTRAVENOUS
  Filled 2018-10-03: qty 5

## 2018-10-03 MED ORDER — HEPARIN SOD (PORK) LOCK FLUSH 100 UNIT/ML IV SOLN
500.0000 [IU] | Freq: Once | INTRAVENOUS | Status: AC | PRN
  Administered 2018-10-03: 500 [IU]
  Filled 2018-10-03: qty 5

## 2018-10-03 MED ORDER — ALTEPLASE 2 MG IJ SOLR
INTRAMUSCULAR | Status: AC
  Filled 2018-10-03: qty 2

## 2018-10-03 MED ORDER — ALTEPLASE 2 MG IJ SOLR
2.0000 mg | Freq: Once | INTRAMUSCULAR | Status: AC | PRN
  Administered 2018-10-03: 2 mg
  Filled 2018-10-03: qty 2

## 2018-10-03 MED ORDER — SODIUM CHLORIDE 0.9 % IV SOLN
Freq: Once | INTRAVENOUS | Status: AC
  Administered 2018-10-03: 12:00:00 via INTRAVENOUS
  Filled 2018-10-03: qty 250

## 2018-10-03 MED ORDER — PALONOSETRON HCL INJECTION 0.25 MG/5ML
INTRAVENOUS | Status: AC
  Filled 2018-10-03: qty 5

## 2018-10-03 MED ORDER — SODIUM CHLORIDE 0.9 % IV SOLN
520.0000 mg | Freq: Once | INTRAVENOUS | Status: AC
  Administered 2018-10-03: 520 mg via INTRAVENOUS
  Filled 2018-10-03: qty 52

## 2018-10-03 MED ORDER — PALONOSETRON HCL INJECTION 0.25 MG/5ML
0.2500 mg | Freq: Once | INTRAVENOUS | Status: AC
  Administered 2018-10-03: 0.25 mg via INTRAVENOUS

## 2018-10-03 NOTE — Telephone Encounter (Signed)
Gave avs and calendar ° °

## 2018-10-03 NOTE — Assessment & Plan Note (Signed)
She tolerated carboplatin only very well without major side effects such as nausea I recommend discontinuation of IV fluid support We will schedule her for final treatment in 3 weeks

## 2018-10-03 NOTE — Assessment & Plan Note (Signed)
Her blood sugar control has been satisfactory She will continue dietary modification and metformin

## 2018-10-03 NOTE — Assessment & Plan Note (Signed)
Her blood pressure control is satisfactory.  I do not plan to change her blood pressure medication

## 2018-10-03 NOTE — Assessment & Plan Note (Signed)
This has resolved Observe only 

## 2018-10-03 NOTE — Progress Notes (Signed)
[facility] OFFICE PROGRESS NOTE  Patient Care Team: Javier Glazier, MD as PCP - General (Internal Medicine)  ASSESSMENT & PLAN:  Endometrial cancer [facility]) She tolerated carboplatin only very well without major side effects such as nausea I recommend discontinuation of IV fluid support We will schedule her for final treatment in 3 weeks  Diabetes mellitus without complication ([place]) Her blood sugar control has been satisfactory She will continue dietary modification and metformin  Essential hypertension Her blood pressure control is satisfactory.  I do not plan to change her blood pressure medication  Elevated liver enzymes This has resolved.  Observe only   No orders of the defined types were placed in this encounter.   INTERVAL HISTORY: Please see below for problem oriented charting. She returns for further follow-up She is doing well with last cycle of treatment Her blood pressure averages 130s and her blood sugar in the 140s She denies nausea or vomiting. No peripheral neuropathy. 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 Endometrium, biopsy - HIGH GRADE CARCINOMA, SEE COMMENT. 2. Cervix, biopsy - UNREMARKABLE ENDOCERVICAL GLANDULAR AND SQUAMOUS MUCOSA. - MULTIPLE DETACHED FRAGMENTS OF HIGH GRADE CARCINOMA, MORPHOLOGICALLY SIMILAR TO SPECIMEN 1. Microscopic Comment 1. 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    04/21/2018 Imaging    US pelvis Abnormally thickened endometrium measuring up to 16 mm. In the setting of post-menopausal bleeding, endometrial sampling is indicated to exclude carcinoma. If results are benign,  sonohysterogram should be considered for focal lesion work-up.     04/27/2018 Imaging    Ct abdomen and pelvis 1. Thickening of the endometrium identified compatible with endometrial carcinoma. 2. No findings to suggest metastatic adenopathy or distant metastatic disease. 3. Hiatal hernia 4.  Aortic Atherosclerosis (ICD10-I70.0).     05/02/2018 Tumor Marker    Patient's tumor was tested for the following markers: CA-125 Results of the tumor marker test revealed 65.3    05/03/2018 Pathology Results    1. Lymph node, sentinel, biopsy, right obturator - ONE OF ONE LYMPH NODES NEGATIVE FOR CARCINOMA (0/1). 2. Lymph node, sentinel, biopsy, left external iliac - FOUR OF FOUR LYMPH NODES NEGATIVE FOR CARCINOMA (0/4). 3. 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 TUMOR INVADES LESS THAN ONE HALF OF THE MYOMETRIUM. SEE ONCOLOGY TABLE. -SEROSA: UNREMARKABLE. NO MALIGNANCY. - CERVIX: BENIGN SQUAMOUS AND ENDOCERVICAL MUCOSA. NO DYSPLASIA OR MALIGNANCY. - BILATERAL OVARIES: INCLUSION CYSTS. NO MALIGNANCY. - BILATERAL FALLOPIAN TUBES: UNREMARKABLE. NO MALIGNANCY. Microscopic Comment 3. UTERUS, CARCINOMA OR CARCINOSARCOMA Procedure: Total hysterectomy with bilateral salpingo-oophorectomy. Right obturator and left external iliac lymph node biopsies. Histologic type: Endometrioid adenocarcinoma with squamous differentiation. Histologic Grade: FIGO grade I. Myometrial invasion: Depth of invasion: 3 mm Myometrial thickness: 12 mm Uterine Serosa Involvement: Not identified. Cervical stromal involvement: Not identified. Extent of involvement of other organs: Uninvolved. Lymphovascular invasion: Not identified. Regional Lymph Nodes: Examined: 5 Sentinel 0 Non-sentinel 5 Total Lymph nodes with metastasis: 0 Isolated tumor cells (< 0.2  mm): 0 Micrometastasis: (> 0.2 mm and < 2.0 mm): 0 Macrometastasis: (> 2.0 mm): 0 Extracapsular extension: N/A.  MMR / MSI testing: Will be ordered. Pathologic Stage Classification (pTNM, AJCC 8th edition): pT1a, pN0 FIGO Stage: IA Representative Tumor Block: 3D-G Comment: Pancytokeratin was performed on the lymph nodes and is negative    05/03/2018 Surgery    Operation: Robotic-assisted laparoscopic total hysterectomy with bilateral salpingoophorectomy, SLN biopsy   Surgeon: Donaciano Eva  Operative Findings:  : 6cm normal appearing uterus. Normal tubes and ovaries. Normal omentum and diaphragm. No suspicious nodes.      05/03/2018 Pathology Results    PERITONEAL WASHING(SPECIMEN 1 OF 1 COLLECTED 05/03/18): MALIGNANT CELLS CONSISTENT WITH METASTATIC ADENOCARCINOMA.    05/18/2018 Tumor Marker    Patient's tumor was tested for the following markers: CA-125 Results of the tumor marker test revealed 77.5    05/27/2018 Genetic Testing    MSH6 c.2731C>T pathogenic variant and GATA2 c.1232C>T VUS identified on the multicancer panel.  The Multi-Gene Panel offered by [facility] includes sequencing and/or deletion duplication testing of the following 85 genes: AIP, ALK, APC, ATM, AXIN2,BAP1,  BARD1, BLM, BMPR1A, BRCA1, BRCA2, BRIP1, CASR, CDC73, CDH1, CDK4, CDKN1B, CDKN1C, CDKN2A (p14ARF), CDKN2A (p16INK4a), CEBPA, CHEK2, CTNNA1, DICER1, DIS3L2, EGFR (c.2369C>T, p.Thr790Met variant only), EPCAM (Deletion/duplication testing only), FH, FLCN, GATA2, GPC3, GREM1 (Promoter region deletion/duplication testing only), HOXB13 (c.251G>A, p.Gly84Glu), HRAS, KIT, MAX, MEN1, MET, MITF (c.952G>A, p.Glu318Lys variant only), MLH1, MSH2, MSH3, MSH6, MUTYH, NBN, NF1, NF2, NTHL1, PALB2, PDGFRA, PHOX2B, PMS2, POLD1, POLE, POT1, PRKAR1A, PTCH1, PTEN, RAD50, RAD51C, RAD51D, RB1, RECQL4, RET, RNF43, RUNX1, SDHAF2, SDHA (sequence changes only), SDHB, SDHC, SDHD, SMAD4, SMARCA4, SMARCB1, SMARCE1, STK11, SUFU, TERC,  TERT, TMEM127, TP53, TSC1, TSC2, VHL, WRN and WT1.  The report date is 05/27/2018.    06/09/2018 Cancer Staging    Staging form: Corpus Uteri - Carcinoma and Carcinosarcoma, AJCC 8th Edition - Pathologic: Stage I (pT1, pN0, cM0) - Signed by Heath Lark, MD on 06/09/2018    06/15/2018 Procedure    Placement of single lumen port a cath via right internal jugular vein. The catheter tip lies at the cavo-atrial junction. A power injectable port a cath was placed and is ready for immediate use.     06/20/2018 -  Chemotherapy    The patient had carboplatin and Taxol    06/25/2018 - 06/27/2018 Hospital Admission    She was admitted to the hospital with deydration     REVIEW OF SYSTEMS:   Constitutional: Denies fevers, chills or abnormal weight loss Eyes: Denies blurriness of vision Ears, nose, mouth, throat, and face: Denies mucositis or sore throat Respiratory: Denies cough, dyspnea or wheezes Cardiovascular: Denies palpitation, chest discomfort or lower extremity swelling Gastrointestinal:  Denies nausea, heartburn or change in bowel habits Skin: Denies abnormal skin rashes Lymphatics: Denies new lymphadenopathy or easy bruising Neurological:Denies numbness, tingling or new weaknesses Behavioral/Psych: Mood is stable, no new changes  All other systems were reviewed with the patient and are negative.  I have reviewed the past medical history, past surgical history, social history and family history with the patient and they are unchanged from previous note.  ALLERGIES:  is allergic to amlodipine; compazine [prochlorperazine edisylate]; gabapentin; lyrica [pregabalin]; sertraline; and chlorhexidine.  MEDICATIONS:  Current Outpatient Medications  Medication Sig Dispense Refill  . acetaminophen (TYLENOL) 325 MG tablet Take 2 tablets (650 mg total) by mouth every 6 (six) hours as needed for mild pain, fever or headache (or Fever >/= 101). 15 tablet 0  . aspirin EC 81 MG tablet Take 81 mg  by mouth daily.    Marland Kitchen atenolol (TENORMIN)  100 MG tablet Take 1 tablet (100 mg total) by mouth at bedtime. For BP 30 tablet 1  . atorvastatin (LIPITOR) 20 MG tablet Take 20 mg by mouth at bedtime.   0  . cholecalciferol (VITAMIN D) 400 units TABS tablet Take 400 Units by mouth daily.    . cyclobenzaprine (FLEXERIL) 5 MG tablet Take 1 tablet (5 mg total) by mouth 3 (three) times daily. 90 tablet 1  . hydrALAZINE (APRESOLINE) 50 MG tablet TAKE 1 TABLET BY MOUTH THREE TIMES A DAY 90 tablet 1  . lidocaine-prilocaine (EMLA) cream Apply to affected area once 30 g 3  . losartan (COZAAR) 50 MG tablet Take 1 tablet (50 mg total) by mouth daily. 30 tablet 11  . metFORMIN (GLUCOPHAGE) 500 MG tablet TAKE 1 TABLET BY MOUTH TWICE A DAY WITH MEALS 60 tablet 1  . Omega-3 Fatty Acids (FISH OIL) 1200 MG CAPS Take 1,200 mg by mouth daily.     Marland Kitchen omeprazole (PRILOSEC) 20 MG capsule Take 1 capsule (20 mg total) by mouth daily. 30 capsule 2  . ondansetron (ZOFRAN) 8 MG tablet Take 1 tablet (8 mg total) by mouth every 8 (eight) hours as needed for refractory nausea / vomiting. Start on day 3 after chemo. 15 tablet 1  . oxyCODONE-acetaminophen (PERCOCET/ROXICET) 5-325 MG tablet Take 2 tablets by mouth every 6 (six) hours as needed for moderate pain or severe pain. 60 tablet 0  . promethazine (PHENERGAN) 25 MG tablet Take 1 tablet (25 mg total) by mouth every 6 (six) hours as needed for nausea or vomiting. 30 tablet 0  . Propylene Glycol (SYSTANE BALANCE) 0.6 % SOLN Place 1 drop into both eyes every morning.    . senna-docusate (SENOKOT-S) 8.6-50 MG tablet Take 2 tablets by mouth at bedtime. 60 tablet 1   No current facility-administered medications for this visit.    Facility-Administered Medications Ordered in Other Visits  Medication Dose Route Frequency Provider Last Rate Last Dose  . heparin lock flush 100 unit/mL  500 Units Intracatheter Once PRN Alvy Bimler, Micala Saltsman, MD      . sodium chloride flush (NS) 0.9 % injection 10  mL  10 mL Intracatheter PRN Alvy Bimler, Massiel Stipp, MD        PHYSICAL EXAMINATION: ECOG PERFORMANCE STATUS: 1 - Symptomatic but completely ambulatory  Vitals:   10/03/18 1051  BP: 132/64  Pulse: 66  Resp: 18  Temp: 97.8 F (36.6 C)  SpO2: 99%   Filed Weights   10/03/18 1051  Weight: 192 lb 12.8 oz (87.5 kg)    GENERAL:alert, no distress and comfortable SKIN: skin color, texture, turgor are normal, no rashes or significant lesions EYES: normal, Conjunctiva are pink and non-injected, sclera clear OROPHARYNX:no exudate, no erythema and lips, buccal mucosa, and tongue normal  NECK: supple, thyroid normal size, non-tender, without nodularity LYMPH:  no palpable lymphadenopathy in the cervical, axillary or inguinal LUNGS: clear to auscultation and percussion with normal breathing effort HEART: regular rate & rhythm and no murmurs and no lower extremity edema ABDOMEN:abdomen soft, non-tender and normal bowel sounds Musculoskeletal:no cyanosis of digits and no clubbing  NEURO: alert & oriented x 3 with fluent speech, no focal motor/sensory deficits  LABORATORY DATA:  I have reviewed the data as listed    Component Value Date/Time   NA 141 10/03/2018 0936   K 4.0 10/03/2018 0936   CL 106 10/03/2018 0936   CO2 21 (L) 10/03/2018 0936   GLUCOSE 126 (H) 10/03/2018 0936   BUN 18 10/03/2018  0936   CREATININE 0.81 10/03/2018 0936   CREATININE 0.75 07/18/2018 1611   CALCIUM 10.0 10/03/2018 0936   PROT 7.7 10/03/2018 0936   ALBUMIN 4.4 10/03/2018 0936   AST 23 10/03/2018 0936   AST 20 07/18/2018 1611   ALT 33 10/03/2018 0936   ALT 42 07/18/2018 1611   ALKPHOS 108 10/03/2018 0936   BILITOT 0.5 10/03/2018 0936   BILITOT 0.5 07/18/2018 1611   GFRNONAA >60 10/03/2018 0936   GFRNONAA >60 07/18/2018 1611   GFRAA >60 10/03/2018 0936   GFRAA >60 07/18/2018 1611    No results found for: SPEP, UPEP  Lab Results  Component Value Date   WBC 7.5 10/03/2018   NEUTROABS 4.0 10/03/2018   HGB  14.3 10/03/2018   HCT 44.1 10/03/2018   MCV 101.8 (H) 10/03/2018   PLT 188 10/03/2018      Chemistry      Component Value Date/Time   NA 141 10/03/2018 0936   K 4.0 10/03/2018 0936   CL 106 10/03/2018 0936   CO2 21 (L) 10/03/2018 0936   BUN 18 10/03/2018 0936   CREATININE 0.81 10/03/2018 0936   CREATININE 0.75 07/18/2018 1611      Component Value Date/Time   CALCIUM 10.0 10/03/2018 0936   ALKPHOS 108 10/03/2018 0936   AST 23 10/03/2018 0936   AST 20 07/18/2018 1611   ALT 33 10/03/2018 0936   ALT 42 07/18/2018 1611   BILITOT 0.5 10/03/2018 0936   BILITOT 0.5 07/18/2018 1611      All questions were answered. The patient knows to call the clinic with any problems, questions or concerns. No barriers to learning was detected.  I spent 15 minutes counseling the patient face to face. The total time spent in the appointment was 20 minutes and more than 50% was on counseling and review of test results  Heath Lark, MD 10/03/2018 1:56 PM

## 2018-10-03 NOTE — Patient Instructions (Signed)
Coronavirus (COVID-19) Are you at risk?  Are you at risk for the Coronavirus (COVID-19)?  To be considered HIGH RISK for Coronavirus (COVID-19), you have to meet the following criteria:  . Traveled to [place], [place], [place], [place] or [place]; or in the [place] to [place], [place], [place], or [place]; and have fever, cough, and shortness of breath within the last 2 weeks of travel OR . Been in close contact with a person diagnosed with COVID-19 within the last 2 weeks and have fever, cough, and shortness of breath . IF YOU DO NOT MEET THESE CRITERIA, YOU ARE CONSIDERED LOW RISK FOR COVID-19.  What to do if you are HIGH RISK for COVID-19?  . If you are having a medical emergency, call 911. . Seek medical care right away. Before you go to a doctor's office, urgent care or emergency department, call ahead and tell them about your recent travel, contact with someone diagnosed with COVID-19, and your symptoms. You should receive instructions from your physician's office regarding next steps of care.  . When you arrive at healthcare provider, tell the healthcare staff immediately you have returned from visiting [place], [place], [place], [place] or [place]; or traveled in the [place] to [place], [place], [place], or [place]; in the last two weeks or you have been in close contact with a person diagnosed with COVID-19 in the last 2 weeks.   . Tell the health care staff about your symptoms: fever, cough and shortness of breath. . After you have been seen by a medical provider, you will be either: o Tested for (COVID-19) and discharged home on quarantine except to seek medical care if symptoms worsen, and asked to  - Stay home and avoid contact with others until you get your results (4-5 days)  - Avoid travel on public transportation if possible (such as bus, train, or airplane) or o Sent to the Emergency Department by EMS for evaluation, COVID-19 testing, and possible  admission depending on your condition and test results.  What to do if you are LOW RISK for COVID-19?  Reduce your risk of any infection by using the same precautions used for avoiding the common cold or flu:  . Wash your hands often with soap and warm water for at least 20 seconds.  If soap and water are not readily available, use an alcohol-based hand sanitizer with at least 60% alcohol.  . If coughing or sneezing, cover your mouth and nose by coughing or sneezing into the elbow areas of your shirt or coat, into a tissue or into your sleeve (not your hands). . Avoid shaking hands with others and consider head nods or verbal greetings only. . Avoid touching your eyes, nose, or mouth with unwashed hands.  . Avoid close contact with people who are sick. . Avoid places or events with large numbers of people in one location, like concerts or sporting events. . Carefully consider travel plans you have or are making. . If you are planning any travel outside or inside the [place], visit the CDC's Travelers' Health webpage for the latest health notices. . If you have some symptoms but not all symptoms, continue to monitor at home and seek medical attention if your symptoms worsen. . If you are having a medical emergency, call 911.   ADDITIONAL HEALTHCARE OPTIONS FOR PATIENTS  [facility] / e-Visit: [email or web address]         [facility]: [phone]  Onset   Urgent Care: [facility]: [place] Discharge Instructions for Patients Receiving Chemotherapy  Today you received the following chemotherapy agents: Carboplatin.   To help prevent nausea and vomiting after your treatment, we encourage you to take your nausea medication as directed.   If you develop nausea and vomiting that is not controlled by your nausea medication, call the clinic.    BELOW ARE SYMPTOMS THAT SHOULD BE REPORTED IMMEDIATELY:  *FEVER GREATER THAN 100.5 F  *CHILLS WITH OR WITHOUT FEVER  NAUSEA AND VOMITING THAT IS NOT CONTROLLED WITH YOUR NAUSEA MEDICATION  *UNUSUAL SHORTNESS OF BREATH  *UNUSUAL BRUISING OR BLEEDING  TENDERNESS IN MOUTH AND THROAT WITH OR WITHOUT PRESENCE OF ULCERS  *URINARY PROBLEMS  *BOWEL PROBLEMS  UNUSUAL RASH Items with * indicate a potential emergency and should be followed up as soon as possible.  Feel free to call the clinic should you have any questions or concerns. The clinic phone number is [phone].  Please show the [facility] at check-in to the Emergency Department and triage nurse.

## 2018-10-06 ENCOUNTER — Ambulatory Visit: Payer: Medicare Other

## 2018-10-07 ENCOUNTER — Ambulatory Visit: Payer: Medicare Other

## 2018-10-08 ENCOUNTER — Other Ambulatory Visit: Payer: Self-pay | Admitting: Hematology and Oncology

## 2018-10-10 ENCOUNTER — Ambulatory Visit: Payer: Medicare Other

## 2018-10-10 ENCOUNTER — Other Ambulatory Visit: Payer: Medicare Other

## 2018-10-10 ENCOUNTER — Ambulatory Visit: Payer: Medicare Other | Admitting: Hematology and Oncology

## 2018-10-13 ENCOUNTER — Ambulatory Visit: Payer: Medicare Other

## 2018-10-14 ENCOUNTER — Ambulatory Visit: Payer: Medicare Other

## 2018-10-26 ENCOUNTER — Inpatient Hospital Stay: Payer: Medicare Other

## 2018-10-26 ENCOUNTER — Other Ambulatory Visit: Payer: Self-pay

## 2018-10-26 ENCOUNTER — Inpatient Hospital Stay (HOSPITAL_BASED_OUTPATIENT_CLINIC_OR_DEPARTMENT_OTHER): Payer: Medicare Other | Admitting: Hematology and Oncology

## 2018-10-26 ENCOUNTER — Inpatient Hospital Stay: Payer: Medicare Other | Attending: Obstetrics

## 2018-10-26 VITALS — BP 142/70 | HR 71 | Temp 98.4°F | Resp 18 | Ht 64.0 in | Wt 193.4 lb

## 2018-10-26 DIAGNOSIS — Z79899 Other long term (current) drug therapy: Secondary | ICD-10-CM | POA: Insufficient documentation

## 2018-10-26 DIAGNOSIS — Z9221 Personal history of antineoplastic chemotherapy: Secondary | ICD-10-CM

## 2018-10-26 DIAGNOSIS — Z7984 Long term (current) use of oral hypoglycemic drugs: Secondary | ICD-10-CM | POA: Diagnosis not present

## 2018-10-26 DIAGNOSIS — Z5111 Encounter for antineoplastic chemotherapy: Secondary | ICD-10-CM | POA: Insufficient documentation

## 2018-10-26 DIAGNOSIS — Z299 Encounter for prophylactic measures, unspecified: Secondary | ICD-10-CM

## 2018-10-26 DIAGNOSIS — Z95828 Presence of other vascular implants and grafts: Secondary | ICD-10-CM

## 2018-10-26 DIAGNOSIS — C541 Malignant neoplasm of endometrium: Secondary | ICD-10-CM | POA: Insufficient documentation

## 2018-10-26 DIAGNOSIS — E119 Type 2 diabetes mellitus without complications: Secondary | ICD-10-CM | POA: Insufficient documentation

## 2018-10-26 DIAGNOSIS — I10 Essential (primary) hypertension: Secondary | ICD-10-CM

## 2018-10-26 DIAGNOSIS — Z7982 Long term (current) use of aspirin: Secondary | ICD-10-CM

## 2018-10-26 LAB — CBC WITH DIFFERENTIAL/PLATELET
Abs Immature Granulocytes: 0.01 10*3/uL (ref 0.00–0.07)
Basophils Absolute: 0.1 10*3/uL (ref 0.0–0.1)
Basophils Relative: 1 %
Eosinophils Absolute: 0.1 10*3/uL (ref 0.0–0.5)
Eosinophils Relative: 1 %
HCT: 41.5 % (ref 36.0–46.0)
Hemoglobin: 13.9 g/dL (ref 12.0–15.0)
Immature Granulocytes: 0 %
Lymphocytes Relative: 36 %
Lymphs Abs: 2.4 10*3/uL (ref 0.7–4.0)
MCH: 34.2 pg — ABNORMAL HIGH (ref 26.0–34.0)
MCHC: 33.5 g/dL (ref 30.0–36.0)
MCV: 102 fL — ABNORMAL HIGH (ref 80.0–100.0)
Monocytes Absolute: 0.6 10*3/uL (ref 0.1–1.0)
Monocytes Relative: 9 %
Neutro Abs: 3.5 10*3/uL (ref 1.7–7.7)
Neutrophils Relative %: 53 %
Platelets: 167 10*3/uL (ref 150–400)
RBC: 4.07 MIL/uL (ref 3.87–5.11)
RDW: 14.6 % (ref 11.5–15.5)
WBC: 6.6 10*3/uL (ref 4.0–10.5)
nRBC: 0 % (ref 0.0–0.2)

## 2018-10-26 LAB — CMP (CANCER CENTER ONLY)
ALT: 32 U/L (ref 0–44)
AST: 22 U/L (ref 15–41)
Albumin: 4 g/dL (ref 3.5–5.0)
Alkaline Phosphatase: 101 U/L (ref 38–126)
Anion gap: 14 (ref 5–15)
BUN: 18 mg/dL (ref 8–23)
CO2: 23 mmol/L (ref 22–32)
Calcium: 9.6 mg/dL (ref 8.9–10.3)
Chloride: 105 mmol/L (ref 98–111)
Creatinine: 0.82 mg/dL (ref 0.44–1.00)
GFR, Est AFR Am: >60 mL/min (ref >60)
GFR, Estimated: >60 mL/min (ref >60)
Glucose, Bld: 135 mg/dL — ABNORMAL HIGH (ref 70–99)
Potassium: 3.9 mmol/L (ref 3.5–5.1)
Sodium: 142 mmol/L (ref 135–145)
Total Bilirubin: 0.3 mg/dL (ref 0.3–1.2)
Total Protein: 7.2 g/dL (ref 6.5–8.1)

## 2018-10-26 MED ORDER — PALONOSETRON HCL INJECTION 0.25 MG/5ML
0.2500 mg | Freq: Once | INTRAVENOUS | Status: AC
  Administered 2018-10-26: 0.25 mg via INTRAVENOUS

## 2018-10-26 MED ORDER — SODIUM CHLORIDE 0.9 % IV SOLN
520.0000 mg | Freq: Once | INTRAVENOUS | Status: AC
  Administered 2018-10-26: 520 mg via INTRAVENOUS
  Filled 2018-10-26: qty 52

## 2018-10-26 MED ORDER — SODIUM CHLORIDE 0.9% FLUSH
10.0000 mL | INTRAVENOUS | Status: DC | PRN
  Administered 2018-10-26: 13:00:00 10 mL
  Filled 2018-10-26: qty 10

## 2018-10-26 MED ORDER — SODIUM CHLORIDE 0.9 % IV SOLN
Freq: Once | INTRAVENOUS | Status: AC
  Administered 2018-10-26: 11:00:00 via INTRAVENOUS
  Filled 2018-10-26: qty 250

## 2018-10-26 MED ORDER — SODIUM CHLORIDE 0.9 % IV SOLN
Freq: Once | INTRAVENOUS | Status: AC
  Administered 2018-10-26: 11:00:00 via INTRAVENOUS
  Filled 2018-10-26: qty 5

## 2018-10-26 MED ORDER — ALTEPLASE 2 MG IJ SOLR
2.0000 mg | Freq: Once | INTRAMUSCULAR | Status: AC | PRN
  Administered 2018-10-26: 2 mg
  Filled 2018-10-26: qty 2

## 2018-10-26 MED ORDER — ACETAMINOPHEN 325 MG PO TABS
650.0000 mg | ORAL_TABLET | Freq: Once | ORAL | Status: AC
  Administered 2018-10-26: 12:00:00 650 mg via ORAL

## 2018-10-26 MED ORDER — ATENOLOL 100 MG PO TABS: 50.0000 mg | ORAL_TABLET | Freq: Every day | ORAL | 1 refills | Status: DC

## 2018-10-26 MED ORDER — HEPARIN SOD (PORK) LOCK FLUSH 100 UNIT/ML IV SOLN
500.0000 [IU] | Freq: Once | INTRAVENOUS | Status: AC | PRN
  Administered 2018-10-26: 13:00:00 500 [IU]
  Filled 2018-10-26: qty 5

## 2018-10-26 MED ORDER — PALONOSETRON HCL INJECTION 0.25 MG/5ML
INTRAVENOUS | Status: AC
  Filled 2018-10-26: qty 5

## 2018-10-26 MED ORDER — ACETAMINOPHEN 325 MG PO TABS
ORAL_TABLET | ORAL | Status: AC
  Filled 2018-10-26: qty 2

## 2018-10-26 MED ORDER — SODIUM CHLORIDE 0.9% FLUSH
10.0000 mL | INTRAVENOUS | Status: DC | PRN
  Administered 2018-10-26: 10 mL
  Filled 2018-10-26: qty 10

## 2018-10-26 NOTE — Patient Instructions (Signed)
Coronavirus (COVID-19) Are you at risk?  Are you at risk for the Coronavirus (COVID-19)?  To be considered HIGH RISK for Coronavirus (COVID-19), you have to meet the following criteria:  . Traveled to [place], [place], [place], [place] or [place]; or in the [place] to [place], [place], [place], or [place]; and have fever, cough, and shortness of breath within the last 2 weeks of travel OR . Been in close contact with a person diagnosed with COVID-19 within the last 2 weeks and have fever, cough, and shortness of breath . IF YOU DO NOT MEET THESE CRITERIA, YOU ARE CONSIDERED LOW RISK FOR COVID-19.  What to do if you are HIGH RISK for COVID-19?  . If you are having a medical emergency, call 911. . Seek medical care right away. Before you go to a doctor's office, urgent care or emergency department, call ahead and tell them about your recent travel, contact with someone diagnosed with COVID-19, and your symptoms. You should receive instructions from your physician's office regarding next steps of care.  . When you arrive at healthcare provider, tell the healthcare staff immediately you have returned from visiting [place], [place], [place], [place] or [place]; or traveled in the [place] to [place], [place], [place], or [place]; in the last two weeks or you have been in close contact with a person diagnosed with COVID-19 in the last 2 weeks.   . Tell the health care staff about your symptoms: fever, cough and shortness of breath. . After you have been seen by a medical provider, you will be either: o Tested for (COVID-19) and discharged home on quarantine except to seek medical care if symptoms worsen, and asked to  - Stay home and avoid contact with others until you get your results (4-5 days)  - Avoid travel on public transportation if possible (such as bus, train, or airplane) or o Sent to the Emergency Department by EMS for evaluation, COVID-19 testing, and possible  admission depending on your condition and test results.  What to do if you are LOW RISK for COVID-19?  Reduce your risk of any infection by using the same precautions used for avoiding the common cold or flu:  . Wash your hands often with soap and warm water for at least 20 seconds.  If soap and water are not readily available, use an alcohol-based hand sanitizer with at least 60% alcohol.  . If coughing or sneezing, cover your mouth and nose by coughing or sneezing into the elbow areas of your shirt or coat, into a tissue or into your sleeve (not your hands). . Avoid shaking hands with others and consider head nods or verbal greetings only. . Avoid touching your eyes, nose, or mouth with unwashed hands.  . Avoid close contact with people who are sick. . Avoid places or events with large numbers of people in one location, like concerts or sporting events. . Carefully consider travel plans you have or are making. . If you are planning any travel outside or inside the [place], visit the CDC's Travelers' Health webpage for the latest health notices. . If you have some symptoms but not all symptoms, continue to monitor at home and seek medical attention if your symptoms worsen. . If you are having a medical emergency, call 911.   ADDITIONAL HEALTHCARE OPTIONS FOR PATIENTS  [facility] / e-Visit: [email or web address]         [facility]: [phone]  [place]   Urgent Care: [facility]: [facility] Discharge Instructions for Patients Receiving Chemotherapy  Today you received the following chemotherapy agents: Carboplatin.   To help prevent nausea and vomiting after your treatment, we encourage you to take your nausea medication as directed.   If you develop nausea and vomiting that is not controlled by your nausea medication, call the clinic.    BELOW ARE SYMPTOMS THAT SHOULD BE REPORTED IMMEDIATELY:  *FEVER GREATER THAN 100.5 F  *CHILLS WITH OR WITHOUT FEVER  NAUSEA AND VOMITING THAT IS NOT CONTROLLED WITH YOUR NAUSEA MEDICATION  *UNUSUAL SHORTNESS OF BREATH  *UNUSUAL BRUISING OR BLEEDING  TENDERNESS IN MOUTH AND THROAT WITH OR WITHOUT PRESENCE OF ULCERS  *URINARY PROBLEMS  *BOWEL PROBLEMS  UNUSUAL RASH Items with * indicate a potential emergency and should be followed up as soon as possible.  Feel free to call the clinic should you have any questions or concerns. The clinic phone number is [phone].  Please show the [facility] at check-in to the Emergency Department and triage nurse.

## 2018-10-26 NOTE — Patient Instructions (Signed)

## 2018-10-27 ENCOUNTER — Other Ambulatory Visit: Payer: Self-pay | Admitting: Hematology and Oncology

## 2018-10-28 ENCOUNTER — Encounter: Payer: Self-pay | Admitting: Hematology and Oncology

## 2018-10-28 DIAGNOSIS — Z299 Encounter for prophylactic measures, unspecified: Secondary | ICD-10-CM | POA: Insufficient documentation

## 2018-10-28 NOTE — Assessment & Plan Note (Signed)
She tolerated single agent carboplatin better We will proceed with final dose today We discussed the plan of care after treatment I plan to repeat imaging study at the end of May for objective assessment of response to therapy If her CT scan is completely normal, we will get her port removed

## 2018-10-28 NOTE — Assessment & Plan Note (Signed)
She will continue metformin for now

## 2018-10-28 NOTE — Assessment & Plan Note (Signed)
Her blood pressure continues to improve We will continue weaning effort to get her off the majority of her blood pressure medications

## 2018-10-28 NOTE — Assessment & Plan Note (Signed)
She has numerous questions in regards to screening colonoscopy, dental work and others I recommend she hold off scheduling any of these appointment until her next evaluation in May.

## 2018-10-28 NOTE — Progress Notes (Signed)
[facility] OFFICE PROGRESS NOTE  Patient Care Team: Javier Glazier, MD as PCP - General (Internal Medicine)  ASSESSMENT & PLAN:  Endometrial cancer [facility]) She tolerated single agent carboplatin better We will proceed with final dose today We discussed the plan of care after treatment I plan to repeat imaging study at the end of May for objective assessment of response to therapy If her CT scan is completely normal, we will get her port removed  Diabetes mellitus without complication ([place]) She will continue metformin for now  Essential hypertension Her blood pressure continues to improve We will continue weaning effort to get her off the majority of her blood pressure medications  Port-A-Cath in place Her port has not been working well After completion of chemotherapy, we will get her port removed  Preventive measure She has numerous questions in regards to screening colonoscopy, dental work and others I recommend she hold off scheduling any of these appointment until her next evaluation in May.   Orders Placed This Encounter  Procedures  . CT ABDOMEN PELVIS W CONTRAST    Standing Status:   Future    Standing Expiration Date:   10/28/2019    Order Specific Question:   If indicated for the ordered procedure, I authorize the administration of contrast media per Radiology protocol    Answer:   Yes    Order Specific Question:   Preferred imaging location?    Answer:   [facility]    Order Specific Question:   Radiology Contrast Protocol - do NOT remove file path    Answer:   [email or web address]    INTERVAL HISTORY: Please see below for problem oriented charting. 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 Endometrium, biopsy - HIGH GRADE CARCINOMA, SEE COMMENT. 2. Cervix, biopsy - UNREMARKABLE ENDOCERVICAL GLANDULAR AND SQUAMOUS MUCOSA. - MULTIPLE DETACHED FRAGMENTS OF HIGH GRADE CARCINOMA, MORPHOLOGICALLY SIMILAR TO SPECIMEN 1. Microscopic Comment 1. 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    04/21/2018 Imaging    US pelvis Abnormally thickened endometrium measuring up to 16 mm. In the setting of post-menopausal bleeding, endometrial sampling is indicated to exclude carcinoma. If results are benign, sonohysterogram should be considered for focal lesion work-up.     04/27/2018 Imaging    Ct abdomen and pelvis 1. Thickening of the endometrium identified compatible with endometrial carcinoma. 2. No findings to suggest metastatic adenopathy or distant metastatic disease. 3. Hiatal hernia 4.  Aortic Atherosclerosis (ICD10-I70.0).     05/02/2018 Tumor Marker    Patient's tumor was tested for the following markers: CA-125 Results of the tumor marker test revealed 65.3    05/03/2018 Pathology Results    1. Lymph node, sentinel, biopsy, right obturator - ONE OF ONE LYMPH NODES NEGATIVE FOR CARCINOMA (0/1). 2. Lymph node, sentinel, biopsy, left external iliac - FOUR OF FOUR LYMPH NODES NEGATIVE FOR CARCINOMA (0/4). 3. Uterus +/- tubes/ovaries,  neoplastic, cervix, bilateral tubes and ovaries - UTERUS: -ENDO/MYOMETRIUM: INVASIVE ENDOMETRIOID ADENOCARCINOMA WITH SQUAMOUS DIFFERENTIATION, FIGO GRADE 1, SPANNING 2.2 CM. TUMOR INVADES LESS THAN ONE HALF OF THE MYOMETRIUM. SEE ONCOLOGY TABLE. -SEROSA:  UNREMARKABLE. NO MALIGNANCY. - CERVIX: BENIGN SQUAMOUS AND ENDOCERVICAL MUCOSA. NO DYSPLASIA OR MALIGNANCY. - BILATERAL OVARIES: INCLUSION CYSTS. NO MALIGNANCY. - BILATERAL FALLOPIAN TUBES: UNREMARKABLE. NO MALIGNANCY. Microscopic Comment 3. UTERUS, CARCINOMA OR CARCINOSARCOMA Procedure: Total hysterectomy with bilateral salpingo-oophorectomy. Right obturator and left external iliac lymph node biopsies. Histologic type: Endometrioid adenocarcinoma with squamous differentiation. Histologic Grade: FIGO grade I. Myometrial invasion: Depth of invasion: 3 mm Myometrial thickness: 12 mm Uterine Serosa Involvement: Not identified. Cervical stromal involvement: Not identified. Extent of involvement of other organs: Uninvolved. Lymphovascular invasion: Not identified. Regional Lymph Nodes: Examined: 5 Sentinel 0 Non-sentinel 5 Total Lymph nodes with metastasis: 0 Isolated tumor cells (< 0.2 mm): 0 Micrometastasis: (> 0.2 mm and < 2.0 mm): 0 Macrometastasis: (> 2.0 mm): 0 Extracapsular extension: N/A. MMR / MSI testing: Will be ordered. Pathologic Stage Classification (pTNM, AJCC 8th edition): pT1a, pN0 FIGO Stage: IA Representative Tumor Block: 3D-G Comment: Pancytokeratin was performed on the lymph nodes and is negative    05/03/2018 Surgery    Operation: Robotic-assisted laparoscopic total hysterectomy with bilateral salpingoophorectomy, SLN biopsy   Surgeon: Donaciano Eva  Operative Findings:  : 6cm normal appearing uterus. Normal tubes and ovaries. Normal omentum and diaphragm. No suspicious nodes.      05/03/2018 Pathology Results    PERITONEAL WASHING(SPECIMEN 1 OF 1 COLLECTED 05/03/18): MALIGNANT CELLS CONSISTENT WITH METASTATIC ADENOCARCINOMA.    05/18/2018 Tumor Marker    Patient's tumor was tested for the following markers: CA-125 Results of the tumor marker test revealed 77.5    05/27/2018 Genetic Testing    MSH6 c.2731C>T pathogenic variant and GATA2  c.1232C>T VUS identified on the multicancer panel.  The Multi-Gene Panel offered by [facility] includes sequencing and/or deletion duplication testing of the following 85 genes: AIP, ALK, APC, ATM, AXIN2,BAP1,  BARD1, BLM, BMPR1A, BRCA1, BRCA2, BRIP1, CASR, CDC73, CDH1, CDK4, CDKN1B, CDKN1C, CDKN2A (p14ARF), CDKN2A (p16INK4a), CEBPA, CHEK2, CTNNA1, DICER1, DIS3L2, EGFR (c.2369C>T, p.Thr790Met variant only), EPCAM (Deletion/duplication testing only), FH, FLCN, GATA2, GPC3, GREM1 (Promoter region deletion/duplication testing only), HOXB13 (c.251G>A, p.Gly84Glu), HRAS, KIT, MAX, MEN1, MET, MITF (c.952G>A, p.Glu318Lys variant only), MLH1, MSH2, MSH3, MSH6, MUTYH, NBN, NF1, NF2, NTHL1, PALB2, PDGFRA, PHOX2B, PMS2, POLD1, POLE, POT1, PRKAR1A, PTCH1, PTEN, RAD50, RAD51C, RAD51D, RB1, RECQL4, RET, RNF43, RUNX1, SDHAF2, SDHA (sequence changes only), SDHB, SDHC, SDHD, SMAD4, SMARCA4, SMARCB1, SMARCE1, STK11, SUFU, TERC, TERT, TMEM127, TP53, TSC1, TSC2, VHL, WRN and WT1.  The report date is 05/27/2018.    06/09/2018 Cancer Staging    Staging form: Corpus Uteri - Carcinoma and Carcinosarcoma, AJCC 8th Edition - Pathologic: Stage I (pT1, pN0, cM0) - Signed by Heath Lark, MD on 06/09/2018    06/15/2018 Procedure    Placement of single lumen port a cath via right internal jugular vein. The catheter tip lies at the cavo-atrial junction. A power injectable port a cath was placed and is ready for immediate use.     06/20/2018 -  Chemotherapy    The patient had carboplatin and Taxol    06/25/2018 - 06/27/2018 Hospital Admission    She was admitted to the hospital with deydration     REVIEW OF SYSTEMS:   Constitutional: Denies fevers, chills or abnormal weight loss Eyes: Denies blurriness of vision Ears, nose, mouth, throat, and face: Denies mucositis or sore throat Respiratory:  Denies cough, dyspnea or wheezes Cardiovascular: Denies palpitation, chest discomfort or lower extremity swelling Gastrointestinal:   Denies nausea, heartburn or change in bowel habits Skin: Denies abnormal skin rashes Lymphatics: Denies new lymphadenopathy or easy bruising Neurological:Denies numbness, tingling or new weaknesses Behavioral/Psych: Mood is stable, no new changes  All other systems were reviewed with the patient and are negative.  I have reviewed the past medical history, past surgical history, social history and family history with the patient and they are unchanged from previous note.  ALLERGIES:  is allergic to amlodipine; compazine [prochlorperazine edisylate]; gabapentin; lyrica [pregabalin]; sertraline; and chlorhexidine.  MEDICATIONS:  Current Outpatient Medications  Medication Sig Dispense Refill  . acetaminophen (TYLENOL) 325 MG tablet Take 2 tablets (650 mg total) by mouth every 6 (six) hours as needed for mild pain, fever or headache (or Fever >/= 101). 15 tablet 0  . aspirin EC 81 MG tablet Take 81 mg by mouth daily.    Marland Kitchen atenolol (TENORMIN) 100 MG tablet Take 0.5 tablets (50 mg total) by mouth at bedtime. For BP 30 tablet 1  . atorvastatin (LIPITOR) 20 MG tablet Take 20 mg by mouth at bedtime.   0  . cholecalciferol (VITAMIN D) 400 units TABS tablet Take 400 Units by mouth daily.    . cyclobenzaprine (FLEXERIL) 5 MG tablet Take 1 tablet (5 mg total) by mouth 3 (three) times daily. 90 tablet 1  . hydrALAZINE (APRESOLINE) 50 MG tablet TAKE 1 TABLET BY MOUTH THREE TIMES A DAY 90 tablet 1  . lidocaine-prilocaine (EMLA) cream Apply to affected area once 30 g 3  . losartan (COZAAR) 50 MG tablet Take 1 tablet (50 mg total) by mouth daily. 30 tablet 11  . metFORMIN (GLUCOPHAGE) 500 MG tablet TAKE 1 TABLET BY MOUTH TWICE A DAY WITH MEALS 60 tablet 1  . Omega-3 Fatty Acids (FISH OIL) 1200 MG CAPS Take 1,200 mg by mouth daily.     Marland Kitchen omeprazole (PRILOSEC) 20 MG capsule Take 1 capsule (20 mg total) by mouth daily. 30 capsule 2  . ondansetron (ZOFRAN) 8 MG tablet Take 1 tablet (8 mg total) by mouth every 8  (eight) hours as needed for refractory nausea / vomiting. Start on day 3 after chemo. 15 tablet 1  . oxyCODONE-acetaminophen (PERCOCET/ROXICET) 5-325 MG tablet Take 2 tablets by mouth every 6 (six) hours as needed for moderate pain or severe pain. 60 tablet 0  . promethazine (PHENERGAN) 25 MG tablet Take 1 tablet (25 mg total) by mouth every 6 (six) hours as needed for nausea or vomiting. 30 tablet 0  . Propylene Glycol (SYSTANE BALANCE) 0.6 % SOLN Place 1 drop into both eyes every morning.    . senna-docusate (SENOKOT-S) 8.6-50 MG tablet Take 2 tablets by mouth at bedtime. 60 tablet 1   No current facility-administered medications for this visit.     PHYSICAL EXAMINATION: ECOG PERFORMANCE STATUS: 1 - Symptomatic but completely ambulatory  Vitals:   10/26/18 0950  BP: (!) 142/70  Pulse: 71  Resp: 18  Temp: 98.4 F (36.9 C)  SpO2: 97%   Filed Weights   10/26/18 0950  Weight: 193 lb 6.4 oz (87.7 kg)    GENERAL:alert, no distress and comfortable SKIN: skin color, texture, turgor are normal, no rashes or significant lesions EYES: normal, Conjunctiva are pink and non-injected, sclera clear OROPHARYNX:no exudate, no erythema and lips, buccal mucosa, and tongue normal  NECK: supple, thyroid normal size, non-tender, without nodularity LYMPH:  no palpable lymphadenopathy in the cervical,  axillary or inguinal LUNGS: clear to auscultation and percussion with normal breathing effort HEART: regular rate & rhythm and no murmurs and no lower extremity edema ABDOMEN:abdomen soft, non-tender and normal bowel sounds Musculoskeletal:no cyanosis of digits and no clubbing  NEURO: alert & oriented x 3 with fluent speech, no focal motor/sensory deficits  LABORATORY DATA:  I have reviewed the data as listed    Component Value Date/Time   NA 142 10/26/2018 0900   K 3.9 10/26/2018 0900   CL 105 10/26/2018 0900   CO2 23 10/26/2018 0900   GLUCOSE 135 (H) 10/26/2018 0900   BUN 18 10/26/2018 0900    CREATININE 0.82 10/26/2018 0900   CALCIUM 9.6 10/26/2018 0900   PROT 7.2 10/26/2018 0900   ALBUMIN 4.0 10/26/2018 0900   AST 22 10/26/2018 0900   ALT 32 10/26/2018 0900   ALKPHOS 101 10/26/2018 0900   BILITOT 0.3 10/26/2018 0900   GFRNONAA >60 10/26/2018 0900   GFRAA >60 10/26/2018 0900    No results found for: SPEP, UPEP  Lab Results  Component Value Date   WBC 6.6 10/26/2018   NEUTROABS 3.5 10/26/2018   HGB 13.9 10/26/2018   HCT 41.5 10/26/2018   MCV 102.0 (H) 10/26/2018   PLT 167 10/26/2018      Chemistry      Component Value Date/Time   NA 142 10/26/2018 0900   K 3.9 10/26/2018 0900   CL 105 10/26/2018 0900   CO2 23 10/26/2018 0900   BUN 18 10/26/2018 0900   CREATININE 0.82 10/26/2018 0900      Component Value Date/Time   CALCIUM 9.6 10/26/2018 0900   ALKPHOS 101 10/26/2018 0900   AST 22 10/26/2018 0900   ALT 32 10/26/2018 0900   BILITOT 0.3 10/26/2018 0900       All questions were answered. The patient knows to call the clinic with any problems, questions or concerns. No barriers to learning was detected.  I spent 25 minutes counseling the patient face to face. The total time spent in the appointment was 30 minutes and more than 50% was on counseling and review of test results  Heath Lark, MD 10/28/2018 9:19 AM

## 2018-10-28 NOTE — Assessment & Plan Note (Signed)
Her port has not been working well After completion of chemotherapy, we will get her port removed

## 2018-10-31 ENCOUNTER — Telehealth: Payer: Self-pay

## 2018-10-31 ENCOUNTER — Telehealth: Payer: Self-pay | Admitting: Hematology and Oncology

## 2018-10-31 NOTE — Telephone Encounter (Signed)
She called and left a message. Radiology called and scheduled scan for 5/20. She does not have the labs scheduled or follow up with Dr. Alvy Bimler. She is just want ing to make sure a scheduling message was sent.

## 2018-10-31 NOTE — Telephone Encounter (Signed)
appt msg was sent as non urgent last week

## 2018-10-31 NOTE — Telephone Encounter (Signed)
Called and given below message. She verbalized understanding. 

## 2018-10-31 NOTE — Telephone Encounter (Signed)
Spoke with patient re 5/20 and 5/21 appointments. Added comment to port appointment that patient would like port to be left accessed for ct after lab/port.

## 2018-11-01 ENCOUNTER — Other Ambulatory Visit: Payer: Self-pay | Admitting: Hematology and Oncology

## 2018-11-09 ENCOUNTER — Other Ambulatory Visit: Payer: Self-pay | Admitting: Hematology and Oncology

## 2018-11-15 ENCOUNTER — Telehealth: Payer: Self-pay | Admitting: *Deleted

## 2018-11-15 ENCOUNTER — Other Ambulatory Visit: Payer: Self-pay | Admitting: Hematology and Oncology

## 2018-11-15 DIAGNOSIS — B379 Candidiasis, unspecified: Secondary | ICD-10-CM

## 2018-11-15 MED ORDER — NYSTATIN 100000 UNIT/GM EX POWD: Freq: Four times a day (QID) | CUTANEOUS | 0 refills | Status: DC

## 2018-11-15 NOTE — Telephone Encounter (Signed)
Spoke to patient and advised medicated powder sent to her pharmacy. She understands to call this clinic back with any further concerns or questions.

## 2018-11-15 NOTE — Telephone Encounter (Signed)
Patient called with concerns of a yeast infection under her left breast. She says it is itches and is slightly painful. Discussed keeping it clean and dry. She is looking for a recommendation of an antifungal powder to use.

## 2018-11-15 NOTE — Telephone Encounter (Signed)
I will send to her local pharmacy

## 2018-11-19 ENCOUNTER — Other Ambulatory Visit: Payer: Self-pay | Admitting: Hematology and Oncology

## 2018-11-23 ENCOUNTER — Ambulatory Visit (HOSPITAL_COMMUNITY)
Admission: RE | Admit: 2018-11-23 | Discharge: 2018-11-23 | Disposition: A | Discharge status: Home / Self Care | Payer: Medicare Other | Source: Ambulatory Visit | Attending: Hematology and Oncology | Admitting: Hematology and Oncology

## 2018-11-23 ENCOUNTER — Other Ambulatory Visit: Payer: Self-pay

## 2018-11-23 ENCOUNTER — Inpatient Hospital Stay: Payer: Medicare Other

## 2018-11-23 ENCOUNTER — Inpatient Hospital Stay: Payer: Medicare Other | Attending: Obstetrics

## 2018-11-23 DIAGNOSIS — Z79899 Other long term (current) drug therapy: Secondary | ICD-10-CM | POA: Diagnosis not present

## 2018-11-23 DIAGNOSIS — Z90722 Acquired absence of ovaries, bilateral: Secondary | ICD-10-CM | POA: Diagnosis not present

## 2018-11-23 DIAGNOSIS — I10 Essential (primary) hypertension: Secondary | ICD-10-CM | POA: Insufficient documentation

## 2018-11-23 DIAGNOSIS — B373 Candidiasis of vulva and vagina: Secondary | ICD-10-CM | POA: Diagnosis not present

## 2018-11-23 DIAGNOSIS — Z7982 Long term (current) use of aspirin: Secondary | ICD-10-CM | POA: Insufficient documentation

## 2018-11-23 DIAGNOSIS — Z90710 Acquired absence of both cervix and uterus: Secondary | ICD-10-CM | POA: Insufficient documentation

## 2018-11-23 DIAGNOSIS — Z7984 Long term (current) use of oral hypoglycemic drugs: Secondary | ICD-10-CM | POA: Insufficient documentation

## 2018-11-23 DIAGNOSIS — C541 Malignant neoplasm of endometrium: Secondary | ICD-10-CM

## 2018-11-23 DIAGNOSIS — Z800 Family history of malignant neoplasm of digestive organs: Secondary | ICD-10-CM | POA: Diagnosis not present

## 2018-11-23 DIAGNOSIS — E1140 Type 2 diabetes mellitus with diabetic neuropathy, unspecified: Secondary | ICD-10-CM | POA: Diagnosis not present

## 2018-11-23 DIAGNOSIS — Z1509 Genetic susceptibility to other malignant neoplasm: Secondary | ICD-10-CM | POA: Diagnosis not present

## 2018-11-23 LAB — COMPREHENSIVE METABOLIC PANEL
ALT: 31 U/L (ref 0–44)
AST: 22 U/L (ref 15–41)
Albumin: 3.8 g/dL (ref 3.5–5.0)
Alkaline Phosphatase: 97 U/L (ref 38–126)
Anion gap: 12 (ref 5–15)
BUN: 15 mg/dL (ref 8–23)
CO2: 23 mmol/L (ref 22–32)
Calcium: 8.8 mg/dL — ABNORMAL LOW (ref 8.9–10.3)
Chloride: 105 mmol/L (ref 98–111)
Creatinine, Ser: 0.78 mg/dL (ref 0.44–1.00)
GFR calc Af Amer: >60 mL/min (ref >60)
GFR calc non Af Amer: >60 mL/min (ref >60)
Glucose, Bld: 134 mg/dL — ABNORMAL HIGH (ref 70–99)
Potassium: 4.1 mmol/L (ref 3.5–5.1)
Sodium: 140 mmol/L (ref 135–145)
Total Bilirubin: 0.4 mg/dL (ref 0.3–1.2)
Total Protein: 6.9 g/dL (ref 6.5–8.1)

## 2018-11-23 LAB — CBC WITH DIFFERENTIAL/PLATELET
Abs Immature Granulocytes: 0.02 10*3/uL (ref 0.00–0.07)
Basophils Absolute: 0 10*3/uL (ref 0.0–0.1)
Basophils Relative: 1 %
Eosinophils Absolute: 0.1 10*3/uL (ref 0.0–0.5)
Eosinophils Relative: 1 %
HCT: 38.6 % (ref 36.0–46.0)
Hemoglobin: 12.9 g/dL (ref 12.0–15.0)
Immature Granulocytes: 0 %
Lymphocytes Relative: 33 %
Lymphs Abs: 2.3 10*3/uL (ref 0.7–4.0)
MCH: 34 pg (ref 26.0–34.0)
MCHC: 33.4 g/dL (ref 30.0–36.0)
MCV: 101.8 fL — ABNORMAL HIGH (ref 80.0–100.0)
Monocytes Absolute: 0.6 10*3/uL (ref 0.1–1.0)
Monocytes Relative: 8 %
Neutro Abs: 4 10*3/uL (ref 1.7–7.7)
Neutrophils Relative %: 57 %
Platelets: 184 10*3/uL (ref 150–400)
RBC: 3.79 MIL/uL — ABNORMAL LOW (ref 3.87–5.11)
RDW: 14.8 % (ref 11.5–15.5)
WBC: 7 10*3/uL (ref 4.0–10.5)
nRBC: 0 % (ref 0.0–0.2)

## 2018-11-23 MED ORDER — SODIUM CHLORIDE (PF) 0.9 % IJ SOLN
INTRAMUSCULAR | Status: AC
  Filled 2018-11-23: qty 50

## 2018-11-23 MED ORDER — HEPARIN SOD (PORK) LOCK FLUSH 100 UNIT/ML IV SOLN
INTRAVENOUS | Status: AC
  Filled 2018-11-23: qty 5

## 2018-11-23 MED ORDER — HEPARIN SOD (PORK) LOCK FLUSH 100 UNIT/ML IV SOLN
500.0000 [IU] | Freq: Once | INTRAVENOUS | Status: AC
  Administered 2018-11-23: 11:00:00 500 [IU] via INTRAVENOUS

## 2018-11-23 MED ORDER — IOHEXOL 300 MG/ML  SOLN
100.0000 mL | Freq: Once | INTRAMUSCULAR | Status: AC | PRN
  Administered 2018-11-23: 100 mL via INTRAVENOUS

## 2018-11-24 ENCOUNTER — Other Ambulatory Visit: Payer: Self-pay

## 2018-11-24 ENCOUNTER — Inpatient Hospital Stay (HOSPITAL_BASED_OUTPATIENT_CLINIC_OR_DEPARTMENT_OTHER): Payer: Medicare Other | Admitting: Hematology and Oncology

## 2018-11-24 VITALS — BP 119/84 | HR 70 | Temp 97.9°F | Resp 18 | Ht 64.0 in | Wt 195.0 lb

## 2018-11-24 DIAGNOSIS — Z1509 Genetic susceptibility to other malignant neoplasm: Secondary | ICD-10-CM

## 2018-11-24 DIAGNOSIS — I10 Essential (primary) hypertension: Secondary | ICD-10-CM

## 2018-11-24 DIAGNOSIS — E1140 Type 2 diabetes mellitus with diabetic neuropathy, unspecified: Secondary | ICD-10-CM | POA: Diagnosis not present

## 2018-11-24 DIAGNOSIS — C541 Malignant neoplasm of endometrium: Secondary | ICD-10-CM | POA: Diagnosis not present

## 2018-11-24 DIAGNOSIS — B379 Candidiasis, unspecified: Secondary | ICD-10-CM

## 2018-11-24 DIAGNOSIS — E119 Type 2 diabetes mellitus without complications: Secondary | ICD-10-CM

## 2018-11-24 DIAGNOSIS — B373 Candidiasis of vulva and vagina: Secondary | ICD-10-CM | POA: Diagnosis not present

## 2018-11-24 DIAGNOSIS — Z800 Family history of malignant neoplasm of digestive organs: Secondary | ICD-10-CM

## 2018-11-24 DIAGNOSIS — Z8481 Family history of carrier of genetic disease: Secondary | ICD-10-CM

## 2018-11-24 DIAGNOSIS — Z7984 Long term (current) use of oral hypoglycemic drugs: Secondary | ICD-10-CM

## 2018-11-24 DIAGNOSIS — Z7982 Long term (current) use of aspirin: Secondary | ICD-10-CM

## 2018-11-24 DIAGNOSIS — Z95828 Presence of other vascular implants and grafts: Secondary | ICD-10-CM

## 2018-11-24 DIAGNOSIS — Z79899 Other long term (current) drug therapy: Secondary | ICD-10-CM

## 2018-11-24 MED ORDER — NYSTATIN 100000 UNIT/GM EX POWD: Freq: Four times a day (QID) | CUTANEOUS | 1 refills | Status: DC

## 2018-11-24 MED ORDER — LOSARTAN POTASSIUM 50 MG PO TABS: 25.0000 mg | ORAL_TABLET | Freq: Every day | ORAL | 11 refills | Status: DC

## 2018-11-24 MED ORDER — ATENOLOL 25 MG PO TABS: 50.0000 mg | ORAL_TABLET | Freq: Every day | ORAL | Status: DC

## 2018-11-24 MED ORDER — HYDRALAZINE HCL 50 MG PO TABS: 25.0000 mg | ORAL_TABLET | Freq: Three times a day (TID) | ORAL | 1 refills | Status: DC

## 2018-11-25 ENCOUNTER — Telehealth: Payer: Self-pay | Admitting: Oncology

## 2018-11-25 ENCOUNTER — Other Ambulatory Visit: Payer: Self-pay | Admitting: Gynecologic Oncology

## 2018-11-25 ENCOUNTER — Encounter: Payer: Self-pay | Admitting: Hematology and Oncology

## 2018-11-25 ENCOUNTER — Encounter: Payer: Self-pay | Admitting: Oncology

## 2018-11-25 DIAGNOSIS — R935 Abnormal findings on diagnostic imaging of other abdominal regions, including retroperitoneum: Secondary | ICD-10-CM

## 2018-11-25 DIAGNOSIS — C541 Malignant neoplasm of endometrium: Secondary | ICD-10-CM

## 2018-11-25 NOTE — Assessment & Plan Note (Signed)
Her genetic testing confirmed possible Lynch syndrome She would need screening colonoscopy sooner than the current recommendation for general population I recommend referral to GI service for further discussion

## 2018-11-25 NOTE — Assessment & Plan Note (Signed)
She has no clinical signs of disease except for a small nodule in her abdomen I have reviewed the scan with the patient and GYN oncologist She will be follow in a few months with repeat imaging study I suspect the nodule is most likely postoperative related Due to her malfunctioning port, in the absence of conclusive disease, I will get her port removed

## 2018-11-25 NOTE — Telephone Encounter (Addendum)
Faxed request for information to [facility] at [phone]. Also called Mateo Flow with the number for Mammography at [facility] to call and schedule a mammogram.

## 2018-11-25 NOTE — Assessment & Plan Note (Signed)
Her blood pressure is improving with discontinuation of treatment I have outlined a written instruction for tapering instructions to discontinue her blood pressure medications gradually We will call her again in a few weeks for further assessment

## 2018-11-25 NOTE — Progress Notes (Signed)
[name] OFFICE PROGRESS NOTE  Patient Care Team: Piazza, Michael J, MD as PCP - General (Internal Medicine)  ASSESSMENT & PLAN:  Endometrial cancer (HCC) She has no clinical signs of disease except for a small nodule in her abdomen I have reviewed the scan with the patient and GYN oncologist She will be follow in a few months with repeat imaging study I suspect the nodule is most likely postoperative related Due to her malfunctioning port, in the absence of conclusive disease, I will get her port removed  Diabetes mellitus without complication (HCC) We have extensive discussion about dietary modification and weight loss strategy She will continue metformin for now  Essential hypertension Her blood pressure is improving with discontinuation of treatment I have outlined a written instruction for tapering instructions to discontinue her blood pressure medications gradually We will call her again in a few weeks for further assessment  Family history of genetic mutation for hereditary nonpolyposis colorectal cancer (HNPCC) 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 IR REMOVAL TUN ACCESS W/ PORT W/O FL MOD SED    Standing Status:   Future    Standing Expiration Date:   01/24/2020    Order Specific Question:   Reason for exam:    Answer:   finished chemo, for port removal    Order Specific Question:   Preferred Imaging Location?    Answer:   [facility]  . Ambulatory referral to Gastroenterology    Referral Priority:   Routine    Referral Type:   Consultation    Referral Reason:    Specialty Services Required    Number of Visits Requested:   1    INTERVAL HISTORY: Please see below for problem oriented charting. The patient returns to review final test results of CT imaging after completion of therapy She felt well since her last treatment except for frequent sweating and recurrent yeast infection under her breasts She also have occasional vaginal discharge but no itching Her appetite is stable No recent changes in bowel habits She has numerous questions about future plan of care, when she can return to get her dental appointment, mammogram, eye appointment, GYN oncology follow-up and GI referral. 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 Endometrium, biopsy - HIGH GRADE CARCINOMA, SEE COMMENT. 2. Cervix, biopsy - UNREMARKABLE ENDOCERVICAL GLANDULAR AND SQUAMOUS MUCOSA. - MULTIPLE DETACHED FRAGMENTS OF HIGH GRADE CARCINOMA, MORPHOLOGICALLY SIMILAR TO SPECIMEN 1. Microscopic Comment 1. 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    04/21/2018 Imaging    US pelvis Abnormally thickened endometrium measuring up to 16 mm. In the setting of post-menopausal bleeding, endometrial sampling is indicated to exclude carcinoma. If results are benign, sonohysterogram should be considered for focal lesion work-up.     04/27/2018 Imaging    Ct   abdomen and pelvis 1. Thickening of the endometrium identified compatible with endometrial carcinoma. 2. No findings to suggest metastatic adenopathy or distant metastatic disease. 3. Hiatal hernia 4.  Aortic Atherosclerosis (ICD10-I70.0).      05/02/2018 Tumor Marker    Patient's tumor was tested for the following markers: CA-125 Results of the tumor marker test revealed 65.3    05/03/2018 Pathology Results    1. Lymph node, sentinel, biopsy, right obturator - ONE OF ONE LYMPH NODES NEGATIVE FOR CARCINOMA (0/1). 2. Lymph node, sentinel, biopsy, left external iliac - FOUR OF FOUR LYMPH NODES NEGATIVE FOR CARCINOMA (0/4). 3. 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 TUMOR INVADES LESS THAN ONE HALF OF THE MYOMETRIUM. SEE ONCOLOGY TABLE. -SEROSA: UNREMARKABLE. NO MALIGNANCY. - CERVIX: BENIGN SQUAMOUS AND ENDOCERVICAL MUCOSA. NO DYSPLASIA OR MALIGNANCY. - BILATERAL OVARIES: INCLUSION CYSTS. NO MALIGNANCY. - BILATERAL FALLOPIAN TUBES: UNREMARKABLE. NO MALIGNANCY. Microscopic Comment 3. UTERUS, CARCINOMA OR CARCINOSARCOMA Procedure: Total hysterectomy with bilateral salpingo-oophorectomy. Right obturator and left external iliac lymph node biopsies. Histologic type: Endometrioid adenocarcinoma with squamous differentiation. Histologic Grade: FIGO grade I. Myometrial invasion: Depth of invasion: 3 mm Myometrial thickness: 12 mm Uterine Serosa Involvement: Not identified. Cervical stromal involvement: Not identified. Extent of involvement of other organs: Uninvolved. Lymphovascular invasion: Not identified. Regional Lymph Nodes: Examined: 5 Sentinel 0 Non-sentinel 5 Total Lymph nodes with metastasis: 0 Isolated tumor cells (< 0.2 mm): 0 Micrometastasis: (> 0.2 mm and < 2.0 mm): 0 Macrometastasis: (> 2.0 mm): 0 Extracapsular extension: N/A. MMR / MSI testing: Will be ordered. Pathologic Stage Classification (pTNM, AJCC 8th edition): pT1a, pN0 FIGO Stage: IA Representative Tumor Block: 3D-G Comment: Pancytokeratin was performed on the lymph nodes and is negative     05/03/2018 Surgery    Operation: Robotic-assisted laparoscopic total hysterectomy with bilateral salpingoophorectomy, SLN biopsy   Surgeon: Donaciano Eva  Operative Findings:  : 6cm normal appearing uterus. Normal tubes and ovaries. Normal omentum and diaphragm. No suspicious nodes.      05/03/2018 Pathology Results    PERITONEAL WASHING(SPECIMEN 1 OF 1 COLLECTED 05/03/18): MALIGNANT CELLS CONSISTENT WITH METASTATIC ADENOCARCINOMA.    05/18/2018 Tumor Marker    Patient's tumor was tested for the following markers: CA-125 Results of the tumor marker test revealed 77.5    05/27/2018 Genetic Testing    MSH6 c.2731C>T pathogenic variant and GATA2 c.1232C>T VUS identified on the multicancer panel.  The Multi-Gene Panel offered by [facility] includes sequencing and/or deletion duplication testing of the following 85 genes: AIP, ALK, APC, ATM, AXIN2,BAP1,  BARD1, BLM, BMPR1A, BRCA1, BRCA2, BRIP1, CASR, CDC73, CDH1, CDK4, CDKN1B, CDKN1C, CDKN2A (p14ARF), CDKN2A (p16INK4a), CEBPA, CHEK2, CTNNA1, DICER1, DIS3L2, EGFR (c.2369C>T, p.Thr790Met variant only), EPCAM (Deletion/duplication testing only), FH, FLCN, GATA2, GPC3, GREM1 (Promoter region deletion/duplication testing only), HOXB13 (c.251G>A, p.Gly84Glu), HRAS, KIT, MAX, MEN1, MET, MITF (c.952G>A, p.Glu318Lys variant only), MLH1, MSH2, MSH3, MSH6, MUTYH, NBN, NF1, NF2, NTHL1, PALB2, PDGFRA, PHOX2B, PMS2, POLD1, POLE, POT1, PRKAR1A, PTCH1, PTEN, RAD50, RAD51C, RAD51D, RB1, RECQL4, RET, RNF43, RUNX1, SDHAF2, SDHA (sequence changes only), SDHB, SDHC, SDHD, SMAD4, SMARCA4, SMARCB1, SMARCE1, STK11, SUFU, TERC, TERT, TMEM127, TP53, TSC1, TSC2, VHL, WRN and WT1.  The report date is 05/27/2018.    06/09/2018 Cancer Staging    Staging form: Corpus Uteri - Carcinoma and Carcinosarcoma, AJCC 8th Edition - Pathologic: Stage I (pT1, pN0, cM0) - Signed by Heath Lark, MD on 06/09/2018    06/15/2018 Procedure  Placement of single lumen port a cath via  right internal jugular vein. The catheter tip lies at the cavo-atrial junction. A power injectable port a cath was placed and is ready for immediate use.     06/20/2018 - 10/26/2018 Chemotherapy    The patient had carboplatin and Taxol x 2 cycles, Cycle 3 Taxol at 50%, then cycle 4-6 without Taxol, carboplatin only    06/25/2018 - 06/27/2018 Hospital Admission    She was admitted to the hospital with deydration    11/23/2018 Imaging    1. 8 mm hyperattenuating nodule along the midline vaginal cuff. Coronal imaging suggests that the hyperattenuation is related to calcification, but enhancement in this nodule cannot be excluded. Patient is status post hysterectomy in the interval since prior CT. While this finding may be related to prior surgery, close follow-up recommended. 2. Otherwise no evidence for metastatic disease in the abdomen or pelvis. 3. Large hiatal hernia with more than 50% of the stomach herniated into the lower chest, stable. 4.  Aortic Atherosclerois (ICD10-170.0)     REVIEW OF SYSTEMS:   Constitutional: Denies fevers, chills or abnormal weight loss Eyes: Denies blurriness of vision Ears, nose, mouth, throat, and face: Denies mucositis or sore throat Respiratory: Denies cough, dyspnea or wheezes Cardiovascular: Denies palpitation, chest discomfort or lower extremity swelling Gastrointestinal:  Denies nausea, heartburn or change in bowel habits Skin: Denies abnormal skin rashes Lymphatics: Denies new lymphadenopathy or easy bruising Neurological:Denies numbness, tingling or new weaknesses Behavioral/Psych: Mood is stable, no new changes  All other systems were reviewed with the patient and are negative.  I have reviewed the past medical history, past surgical history, social history and family history with the patient and they are unchanged from previous note.  ALLERGIES:  is allergic to amlodipine; compazine [prochlorperazine edisylate]; gabapentin; lyrica  [pregabalin]; sertraline; and chlorhexidine.  MEDICATIONS:  Current Outpatient Medications  Medication Sig Dispense Refill  . acetaminophen (TYLENOL) 325 MG tablet Take 2 tablets (650 mg total) by mouth every 6 (six) hours as needed for mild pain, fever or headache (or Fever >/= 101). 15 tablet 0  . aspirin EC 81 MG tablet Take 81 mg by mouth daily.    Marland Kitchen atenolol (TENORMIN) 25 MG tablet Take 2 tablets (50 mg total) by mouth at bedtime. For BP    . atorvastatin (LIPITOR) 20 MG tablet Take 20 mg by mouth at bedtime.   0  . cholecalciferol (VITAMIN D) 400 units TABS tablet Take 400 Units by mouth daily.    . cyclobenzaprine (FLEXERIL) 5 MG tablet Take 1 tablet (5 mg total) by mouth 3 (three) times daily. 90 tablet 1  . hydrALAZINE (APRESOLINE) 50 MG tablet Take 0.5 tablets (25 mg total) by mouth 3 (three) times daily. 90 tablet 1  . lidocaine-prilocaine (EMLA) cream Apply to affected area once 30 g 3  . losartan (COZAAR) 50 MG tablet Take 0.5 tablets (25 mg total) by mouth daily. 30 tablet 11  . metFORMIN (GLUCOPHAGE) 500 MG tablet TAKE 1 TABLET BY MOUTH TWICE A DAY WITH MEALS 60 tablet 1  . nystatin (MYCOSTATIN/NYSTOP) powder Apply topically 4 (four) times daily. 15 g 1  . Omega-3 Fatty Acids (FISH OIL) 1200 MG CAPS Take 1,200 mg by mouth daily.     Marland Kitchen omeprazole (PRILOSEC) 20 MG capsule Take 1 capsule (20 mg total) by mouth daily. 30 capsule 2  . ondansetron (ZOFRAN) 8 MG tablet Take 1 tablet (8 mg total) by mouth every 8 (eight) hours as  needed for refractory nausea / vomiting. Start on day 3 after chemo. 15 tablet 1  . oxyCODONE-acetaminophen (PERCOCET/ROXICET) 5-325 MG tablet Take 2 tablets by mouth every 6 (six) hours as needed for moderate pain or severe pain. 60 tablet 0  . promethazine (PHENERGAN) 25 MG tablet Take 1 tablet (25 mg total) by mouth every 6 (six) hours as needed for nausea or vomiting. 30 tablet 0  . Propylene Glycol (SYSTANE BALANCE) 0.6 % SOLN Place 1 drop into both eyes  every morning.    . senna-docusate (SENOKOT-S) 8.6-50 MG tablet Take 2 tablets by mouth at bedtime. 60 tablet 1   No current facility-administered medications for this visit.     PHYSICAL EXAMINATION: ECOG PERFORMANCE STATUS: 1 - Symptomatic but completely ambulatory  Vitals:   11/24/18 1030  BP: 119/84  Pulse: 70  Resp: 18  Temp: 97.9 F (36.6 C)  SpO2: 98%   Filed Weights   11/24/18 1030  Weight: 195 lb (88.5 kg)    GENERAL:alert, no distress and comfortable Musculoskeletal:no cyanosis of digits and no clubbing  NEURO: alert & oriented x 3 with fluent speech, no focal motor/sensory deficits  LABORATORY DATA:  I have reviewed the data as listed    Component Value Date/Time   NA 140 11/23/2018 0930   K 4.1 11/23/2018 0930   CL 105 11/23/2018 0930   CO2 23 11/23/2018 0930   GLUCOSE 134 (H) 11/23/2018 0930   BUN 15 11/23/2018 0930   CREATININE 0.78 11/23/2018 0930   CREATININE 0.82 10/26/2018 0900   CALCIUM 8.8 (L) 11/23/2018 0930   PROT 6.9 11/23/2018 0930   ALBUMIN 3.8 11/23/2018 0930   AST 22 11/23/2018 0930   AST 22 10/26/2018 0900   ALT 31 11/23/2018 0930   ALT 32 10/26/2018 0900   ALKPHOS 97 11/23/2018 0930   BILITOT 0.4 11/23/2018 0930   BILITOT 0.3 10/26/2018 0900   GFRNONAA >60 11/23/2018 0930   GFRNONAA >60 10/26/2018 0900   GFRAA >60 11/23/2018 0930   GFRAA >60 10/26/2018 0900    No results found for: SPEP, UPEP  Lab Results  Component Value Date   WBC 7.0 11/23/2018   NEUTROABS 4.0 11/23/2018   HGB 12.9 11/23/2018   HCT 38.6 11/23/2018   MCV 101.8 (H) 11/23/2018   PLT 184 11/23/2018      Chemistry      Component Value Date/Time   NA 140 11/23/2018 0930   K 4.1 11/23/2018 0930   CL 105 11/23/2018 0930   CO2 23 11/23/2018 0930   BUN 15 11/23/2018 0930   CREATININE 0.78 11/23/2018 0930   CREATININE 0.82 10/26/2018 0900      Component Value Date/Time   CALCIUM 8.8 (L) 11/23/2018 0930   ALKPHOS 97 11/23/2018 0930   AST 22  11/23/2018 0930   AST 22 10/26/2018 0900   ALT 31 11/23/2018 0930   ALT 32 10/26/2018 0900   BILITOT 0.4 11/23/2018 0930   BILITOT 0.3 10/26/2018 0900       RADIOGRAPHIC STUDIES: I have reviewed multiple imaging studies with the patient I have personally reviewed the radiological images as listed and agreed with the findings in the report. Ct Abdomen Pelvis W Contrast  Result Date: 11/23/2018 CLINICAL DATA:  Endometrial cancer. Status post hysterectomy. EXAM: CT ABDOMEN AND PELVIS WITH CONTRAST TECHNIQUE: Multidetector CT imaging of the abdomen and pelvis was performed using the standard protocol following bolus administration of intravenous contrast. CONTRAST:  100mL OMNIPAQUE IOHEXOL 300 MG/ML  SOLN COMPARISON:    04/27/2018. FINDINGS: Lower chest: Large hiatal hernia, incompletely visualized. Hepatobiliary: The liver shows diffusely decreased attenuation suggesting steatosis. No suspicious focal abnormality within the liver parenchyma. There is no evidence for gallstones, gallbladder wall thickening, or pericholecystic fluid. No intrahepatic or extrahepatic biliary dilation. Pancreas: No focal mass lesion. No dilatation of the main duct. No intraparenchymal cyst. No peripancreatic edema. Spleen: No splenomegaly. No focal mass lesion. Adrenals/Urinary Tract: No adrenal nodule or mass. Kidneys unremarkable. No evidence for hydroureter. Bladder decompressed. Stomach/Bowel: Large hiatal hernia, incompletely visualized. Duodenum is normally positioned as is the ligament of Treitz. No small bowel wall thickening. No small bowel dilatation. The terminal ileum is normal. The appendix is normal. Large colonic stool volume noted. Diverticular changes are noted in the left colon without evidence of diverticulitis. Vascular/Lymphatic: There is abdominal aortic atherosclerosis without aneurysm. There is no gastrohepatic or hepatoduodenal ligament lymphadenopathy. No intraperitoneal or retroperitoneal  lymphadenopathy. No pelvic sidewall lymphadenopathy. Reproductive: The uterus is surgically absent. 8 mm hyperattenuating nodule is seen along the vaginal cuff and the increased density may be related to enhancement. There is no adnexal mass. Other: No intraperitoneal free fluid. No omental or mesenteric nodularity. Musculoskeletal: No worrisome lytic or sclerotic osseous abnormality. IMPRESSION: 1. 8 mm hyperattenuating nodule along the midline vaginal cuff. Coronal imaging suggests that the hyperattenuation is related to calcification, but enhancement in this nodule cannot be excluded. Patient is status post hysterectomy in the interval since prior CT. While this finding may be related to prior surgery, close follow-up recommended. 2. Otherwise no evidence for metastatic disease in the abdomen or pelvis. 3. Large hiatal hernia with more than 50% of the stomach herniated into the lower chest, stable. 4.  Aortic Atherosclerois (ICD10-170.0) Electronically Signed   By: Eric  Mansell M.D.   On: 11/23/2018 18:39    All questions were answered. The patient knows to call the clinic with any problems, questions or concerns. No barriers to learning was detected.  I spent 25 minutes counseling the patient face to face. The total time spent in the appointment was 30 minutes and more than 50% was on counseling and review of test results  Ni Gorsuch, MD 11/25/2018 10:48 AM  

## 2018-11-25 NOTE — Assessment & Plan Note (Signed)
She has recurrent yeast infection secondary to poorly controlled hyperglycemia/diabetes I refill her prescription of topical nystatin to use

## 2018-11-25 NOTE — Telephone Encounter (Signed)
Called Jordan Martinez and advised her of PET scan on 8.21.20 at 9:00 (arrival at 8:30, npo 6 hours prior, no carbs 12 hours prior) and follow up with Dr. Denman George on 03/03/19 at 1:15.  She verbalized agreement and had the following questions:  1.She needs a colonoscopy/endoscopy.  Last one was 07/2016 with Dr. Larwance Rote at [facility] in [place].  She would like assistance in getting her records. 2. She needs assistance in scheduling a mammogram. Last one was 10/2017 at [facility]. 3. She is wondering if Dr. Alvy Bimler will refill oxycodone/acetaminophen and flexeril next week because she is not sure when she will get in to see Dr. Evelene Croon at [facility] for knee pain. 4. She is wondering when she can get the new shingles vaccine.  Advised her that I can contact [facility] to have her records sent and work on getting her scheduled for mammogram.

## 2018-11-25 NOTE — Assessment & Plan Note (Signed)
We have extensive discussion about dietary modification and weight loss strategy She will continue metformin for now

## 2018-11-25 NOTE — Progress Notes (Signed)
Based on CT scan results with 8 mm nodule noted at the vaginal cuff, plan for PET scan in three months to evaluate the area with follow up appointment with Dr. Denman George after.

## 2018-11-29 ENCOUNTER — Other Ambulatory Visit: Payer: Self-pay | Admitting: Radiology

## 2018-11-29 ENCOUNTER — Encounter: Payer: Self-pay | Admitting: Oncology

## 2018-11-29 NOTE — Telephone Encounter (Signed)
1) For pain medication refill she need to contact Dr. Evelene Croon first. I do not want to refill her medication and then she gets fired from the practice. Most practice has narcotic contract in place 2) I do not recommend shingles vaccine for her now. I recommend wait 6 months

## 2018-11-29 NOTE — Telephone Encounter (Signed)
Called Jordan Martinez back and advised her of message from Dr. Alvy Bimler. Scheduled appointment with Dr. Denman George at 2:15 tomorrow per [name], NP.  Also advised her to call [facility] directly to request the CD's of her mammogram.  She verbalized understanding and agreement.

## 2018-11-29 NOTE — Telephone Encounter (Signed)
Called Jordan Martinez back with message from Dr. Alvy Bimler.  She verbalized agreement and then had 2 more questions:    1. Is it OK to wait until August to see Dr. Denman George when she has a vaginal odor that she has had for weeks. She denies having any itching/vaginal bleeding.  Advised her that I will check with [name], NP and let her know.  2. Is it OK to take the dietary supplement Keto Booster.  She would take it twice a day with meals.

## 2018-11-29 NOTE — Telephone Encounter (Signed)
For Keto Booster, I am not sure she needs it Most nutritional supplement is full of sugar and artificial stuff and since she is trying to lose weight maybe it is not a good idea

## 2018-11-30 ENCOUNTER — Other Ambulatory Visit: Payer: Self-pay

## 2018-11-30 ENCOUNTER — Encounter: Payer: Self-pay | Admitting: Gynecologic Oncology

## 2018-11-30 ENCOUNTER — Other Ambulatory Visit: Payer: Self-pay | Admitting: Radiology

## 2018-11-30 ENCOUNTER — Inpatient Hospital Stay (HOSPITAL_BASED_OUTPATIENT_CLINIC_OR_DEPARTMENT_OTHER): Payer: Medicare Other | Admitting: Gynecologic Oncology

## 2018-11-30 VITALS — BP 148/84 | HR 85 | Temp 98.5°F | Resp 18 | Ht 64.0 in | Wt 194.8 lb

## 2018-11-30 DIAGNOSIS — C541 Malignant neoplasm of endometrium: Secondary | ICD-10-CM

## 2018-11-30 DIAGNOSIS — Z90710 Acquired absence of both cervix and uterus: Secondary | ICD-10-CM

## 2018-11-30 DIAGNOSIS — Z1509 Genetic susceptibility to other malignant neoplasm: Secondary | ICD-10-CM

## 2018-11-30 DIAGNOSIS — Z90722 Acquired absence of ovaries, bilateral: Secondary | ICD-10-CM | POA: Diagnosis not present
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
## 2018-11-30 NOTE — Patient Instructions (Signed)
Dr Denman George recommends PET scan to evaluate the area at the top of the vagina.  She will see you for follow-up after that time.  Please notify Dr Denman George at phone number [phone] if you notice vaginal bleeding, new pelvic or abdominal pains, bloating, feeling full easy, or a change in bladder or bowel function.   If you would like Dr Denman George to refer you to a bladder specialist for leakage symptoms please notify her at the above number.

## 2018-12-01 ENCOUNTER — Ambulatory Visit (HOSPITAL_COMMUNITY)
Admission: RE | Admit: 2018-12-01 | Discharge: 2018-12-01 | Disposition: A | Discharge status: Home / Self Care | Payer: Medicare Other | Source: Ambulatory Visit | Attending: Hematology and Oncology | Admitting: Hematology and Oncology

## 2018-12-01 ENCOUNTER — Other Ambulatory Visit: Payer: Self-pay | Admitting: Hematology and Oncology

## 2018-12-01 ENCOUNTER — Other Ambulatory Visit: Payer: Self-pay

## 2018-12-01 ENCOUNTER — Encounter: Payer: Self-pay | Admitting: Hematology and Oncology

## 2018-12-01 ENCOUNTER — Encounter (HOSPITAL_COMMUNITY): Payer: Self-pay

## 2018-12-01 ENCOUNTER — Telehealth: Payer: Self-pay | Admitting: Oncology

## 2018-12-01 DIAGNOSIS — C541 Malignant neoplasm of endometrium: Secondary | ICD-10-CM | POA: Diagnosis not present

## 2018-12-01 DIAGNOSIS — Z452 Encounter for adjustment and management of vascular access device: Secondary | ICD-10-CM | POA: Diagnosis present

## 2018-12-01 DIAGNOSIS — Z7984 Long term (current) use of oral hypoglycemic drugs: Secondary | ICD-10-CM | POA: Diagnosis not present

## 2018-12-01 DIAGNOSIS — K219 Gastro-esophageal reflux disease without esophagitis: Secondary | ICD-10-CM | POA: Insufficient documentation

## 2018-12-01 DIAGNOSIS — Z95828 Presence of other vascular implants and grafts: Secondary | ICD-10-CM

## 2018-12-01 DIAGNOSIS — Z888 Allergy status to other drugs, medicaments and biological substances status: Secondary | ICD-10-CM | POA: Insufficient documentation

## 2018-12-01 DIAGNOSIS — I10 Essential (primary) hypertension: Secondary | ICD-10-CM | POA: Insufficient documentation

## 2018-12-01 DIAGNOSIS — Z96653 Presence of artificial knee joint, bilateral: Secondary | ICD-10-CM | POA: Diagnosis not present

## 2018-12-01 DIAGNOSIS — Z7982 Long term (current) use of aspirin: Secondary | ICD-10-CM | POA: Diagnosis not present

## 2018-12-01 DIAGNOSIS — E7800 Pure hypercholesterolemia, unspecified: Secondary | ICD-10-CM | POA: Insufficient documentation

## 2018-12-01 DIAGNOSIS — Z808 Family history of malignant neoplasm of other organs or systems: Secondary | ICD-10-CM | POA: Insufficient documentation

## 2018-12-01 DIAGNOSIS — Z79899 Other long term (current) drug therapy: Secondary | ICD-10-CM | POA: Diagnosis not present

## 2018-12-01 DIAGNOSIS — M1990 Unspecified osteoarthritis, unspecified site: Secondary | ICD-10-CM | POA: Insufficient documentation

## 2018-12-01 DIAGNOSIS — Z90710 Acquired absence of both cervix and uterus: Secondary | ICD-10-CM | POA: Diagnosis not present

## 2018-12-01 DIAGNOSIS — E1140 Type 2 diabetes mellitus with diabetic neuropathy, unspecified: Secondary | ICD-10-CM | POA: Diagnosis not present

## 2018-12-01 HISTORY — PX: IR REMOVAL TUN ACCESS W/ PORT W/O FL MOD SED: IMG2290

## 2018-12-01 LAB — CBC WITH DIFFERENTIAL/PLATELET
Abs Immature Granulocytes: 0.01 10*3/uL (ref 0.00–0.07)
Basophils Absolute: 0 10*3/uL (ref 0.0–0.1)
Basophils Relative: 1 %
Eosinophils Absolute: 0.1 10*3/uL (ref 0.0–0.5)
Eosinophils Relative: 1 %
HCT: 40.8 % (ref 36.0–46.0)
Hemoglobin: 13.5 g/dL (ref 12.0–15.0)
Immature Granulocytes: 0 %
Lymphocytes Relative: 42 %
Lymphs Abs: 2.6 10*3/uL (ref 0.7–4.0)
MCH: 35.3 pg — ABNORMAL HIGH (ref 26.0–34.0)
MCHC: 33.1 g/dL (ref 30.0–36.0)
MCV: 106.8 fL — ABNORMAL HIGH (ref 80.0–100.0)
Monocytes Absolute: 0.6 10*3/uL (ref 0.1–1.0)
Monocytes Relative: 9 %
Neutro Abs: 2.8 10*3/uL (ref 1.7–7.7)
Neutrophils Relative %: 47 %
Platelets: 355 10*3/uL (ref 150–400)
RBC: 3.82 MIL/uL — ABNORMAL LOW (ref 3.87–5.11)
RDW: 15.1 % (ref 11.5–15.5)
WBC: 6.1 10*3/uL (ref 4.0–10.5)
nRBC: 0 % (ref 0.0–0.2)

## 2018-12-01 LAB — PROTIME-INR
INR: 0.8 (ref 0.8–1.2)
Prothrombin Time: 11 seconds — ABNORMAL LOW (ref 11.4–15.2)

## 2018-12-01 LAB — GLUCOSE, CAPILLARY: Glucose-Capillary: 103 mg/dL — ABNORMAL HIGH (ref 70–99)

## 2018-12-01 MED ORDER — FENTANYL CITRATE (PF) 100 MCG/2ML IJ SOLN
INTRAMUSCULAR | Status: AC | PRN
  Administered 2018-12-01 (×2): 50 ug via INTRAVENOUS

## 2018-12-01 MED ORDER — LIDOCAINE-EPINEPHRINE 1 %-1:100000 IJ SOLN
INTRAMUSCULAR | Status: AC
  Filled 2018-12-01: qty 1

## 2018-12-01 MED ORDER — CEFAZOLIN SODIUM-DEXTROSE 2-4 GM/100ML-% IV SOLN
INTRAVENOUS | Status: AC
  Administered 2018-12-01: 2 g via INTRAVENOUS
  Filled 2018-12-01: qty 100

## 2018-12-01 MED ORDER — FENTANYL CITRATE (PF) 100 MCG/2ML IJ SOLN
INTRAMUSCULAR | Status: AC
  Filled 2018-12-01: qty 2

## 2018-12-01 MED ORDER — CEFAZOLIN SODIUM-DEXTROSE 2-4 GM/100ML-% IV SOLN
2.0000 g | INTRAVENOUS | Status: AC
  Administered 2018-12-01: 10:00:00 2 g via INTRAVENOUS

## 2018-12-01 MED ORDER — MIDAZOLAM HCL 2 MG/2ML IJ SOLN
INTRAMUSCULAR | Status: AC | PRN
  Administered 2018-12-01 (×3): 1 mg via INTRAVENOUS

## 2018-12-01 MED ORDER — MIDAZOLAM HCL 2 MG/2ML IJ SOLN
INTRAMUSCULAR | Status: AC
  Filled 2018-12-01: qty 4

## 2018-12-01 MED ORDER — SODIUM CHLORIDE 0.9 % IV SOLN
INTRAVENOUS | Status: DC
  Administered 2018-12-01: 09:00:00 via INTRAVENOUS

## 2018-12-01 MED ORDER — LIDOCAINE-EPINEPHRINE 2 %-1:100000 IJ SOLN
INTRAMUSCULAR | Status: AC | PRN
  Administered 2018-12-01: 10 mL

## 2018-12-01 NOTE — H&P (Addendum)
Referring Physician(s): Heath Lark  Supervising Physician: Sandi Mariscal  Patient Status:  [facility]  Chief Complaint: "I'm getting my port out"   Subjective: Patient familiar to IR service from prior Port-A-Cath placement on 06/15/2018. She has a history of endometrial carcinoma, status post surgery and completion of chemotherapy.  She presents today for Port-A-Cath removal.  She currently denies fever, headache, chest pain, dyspnea, cough, abdominal pain, nausea, vomiting or bleeding.  She does have intermittent back pain as well as lower extremity neuropathy.  She states she has had difficulty with port use since placement but request for IR evaluation was never placed.  Past Medical History:  Diagnosis Date  . Anxiety   . Arthritis   . Depression   . Diabetes mellitus without complication (HCC)    diet controlled. States always has borderline HgbA1C  . Endometrial cancer ([place])   . Family history of colon cancer   . Family history of genetic mutation for hereditary nonpolyposis colorectal cancer (HNPCC)   . GERD (gastroesophageal reflux disease)   . High cholesterol   . History of kidney stones    years   . Hypertension   . Neuropathy   . Sensation of pressure in bladder area   . Uterine cramping    Past Surgical History:  Procedure Laterality Date  . burnt the nerves  Right 04/29/2018   in right knee   . burnt the nerves  Left 04/22/2018   left knee nerve   . cystoscopy kidney stone   1985  . IR IMAGING GUIDED PORT INSERTION  06/15/2018  . REPLACEMENT TOTAL KNEE BILATERAL Bilateral 2018   Right x 2. 2019 undergoing intermittent injection "RFA" procedures with Dr. Sheliah Mends [facility]  . REVISION TOTAL KNEE ARTHROPLASTY Right 03/2017  . ROBOTIC ASSISTED TOTAL HYSTERECTOMY WITH BILATERAL SALPINGO OOPHERECTOMY N/A 05/03/2018   Procedure: XI ROBOTIC ASSISTED TOTAL HYSTERECTOMY WITH BILATERAL SALPINGO OOPHORECTOMY;  Surgeon: Everitt Amber, MD;  Location: [facility];   Service: Gynecology;  Laterality: N/A;  . SENTINEL NODE BIOPSY N/A 05/03/2018   Procedure: SENTINEL NODE BIOPSY;  Surgeon: Everitt Amber, MD;  Location: [facility];  Service: Gynecology;  Laterality: N/A;  . TONSILLECTOMY AND ADENOIDECTOMY    . TUBAL LIGATION        Allergies: Amlodipine; Compazine [prochlorperazine edisylate]; Gabapentin; Lyrica [pregabalin]; Sertraline; and Chlorhexidine  Medications: Prior to Admission medications   Medication Sig Start Date End Date Taking? Authorizing Provider  aspirin EC 81 MG tablet Take 81 mg by mouth daily.   Yes [provider]  atenolol (TENORMIN) 25 MG tablet Take 2 tablets (50 mg total) by mouth at bedtime. For BP 11/24/18  Yes Gorsuch, Ni, MD  atorvastatin (LIPITOR) 20 MG tablet Take 20 mg by mouth at bedtime.  01/17/18  Yes [provider]  cholecalciferol (VITAMIN D) 400 units TABS tablet Take 400 Units by mouth daily.   Yes [provider]  cyclobenzaprine (FLEXERIL) 5 MG tablet Take 1 tablet (5 mg total) by mouth 3 (three) times daily. 09/09/18  Yes Gorsuch, Ni, MD  hydrALAZINE (APRESOLINE) 50 MG tablet Take 0.5 tablets (25 mg total) by mouth 3 (three) times daily. 11/24/18  Yes Gorsuch, Ni, MD  losartan (COZAAR) 50 MG tablet Take 0.5 tablets (25 mg total) by mouth daily. 11/24/18  Yes Gorsuch, Ni, MD  metFORMIN (GLUCOPHAGE) 500 MG tablet TAKE 1 TABLET BY MOUTH TWICE A DAY WITH MEALS 11/19/18  Yes Gorsuch, Ni, MD  nystatin (MYCOSTATIN/NYSTOP) powder Apply topically 4 (four) times  daily. 11/24/18  Yes Gorsuch, Ni, MD  Omega-3 Fatty Acids (FISH OIL) 1200 MG CAPS Take 1,200 mg by mouth daily.    Yes [provider]  omeprazole (PRILOSEC) 20 MG capsule Take 1 capsule (20 mg total) by mouth daily. 06/27/18  Yes Roxan Hockey, MD  oxyCODONE-acetaminophen (PERCOCET/ROXICET) 5-325 MG tablet Take 2 tablets by mouth every 6 (six) hours as needed for moderate pain or severe pain. 09/23/18  Yes Gorsuch, Ni, MD  Propylene  Glycol (SYSTANE BALANCE) 0.6 % SOLN Place 1 drop into both eyes every morning.   Yes [provider]  acetaminophen (TYLENOL) 325 MG tablet Take 2 tablets (650 mg total) by mouth every 6 (six) hours as needed for mild pain, fever or headache (or Fever >/= 101). 06/27/18   Roxan Hockey, MD  lidocaine-prilocaine (EMLA) cream Apply to affected area once 06/16/18   Heath Lark, MD  ondansetron (ZOFRAN) 8 MG tablet Take 1 tablet (8 mg total) by mouth every 8 (eight) hours as needed for refractory nausea / vomiting. Start on day 3 after chemo. 06/27/18   Roxan Hockey, MD  promethazine (PHENERGAN) 25 MG tablet Take 1 tablet (25 mg total) by mouth every 6 (six) hours as needed for nausea or vomiting. 06/16/18   Alvy Bimler, Ni, MD  senna-docusate (SENOKOT-S) 8.6-50 MG tablet Take 2 tablets by mouth at bedtime. 06/27/18 06/27/19  Roxan Hockey, MD     Vital Signs: BP (!) 146/80 (BP Location: Left Arm)   Pulse 67   Temp 98.3 F (36.8 C) (Oral)   Resp 18   SpO2 99%   Physical Exam awake, alert.  Chest clear to auscultation bilaterally.  Clean, intact right chest wall Port-A-Cath.  Heart with regular rate and rhythm.  Abdomen soft, positive bowel sounds, nontender.  No lower extremity edema.  Imaging: No results found.  Labs:  CBC: Recent Labs    09/09/18 1257 10/03/18 0936 10/26/18 0839 11/23/18 0930  WBC 6.8 7.5 6.6 7.0  HGB 13.9 14.3 13.9 12.9  HCT 42.2 44.1 41.5 38.6  PLT 265 188 167 184    COAGS: Recent Labs    06/15/18 1057  INR 0.82    BMP: Recent Labs    09/09/18 1257 10/03/18 0936 10/26/18 0900 11/23/18 0930  NA 142 141 142 140  K 3.6 4.0 3.9 4.1  CL 107 106 105 105  CO2 27 21* 23 23  GLUCOSE 114* 126* 135* 134*  BUN 12 18 18 15   CALCIUM 9.2 10.0 9.6 8.8*  CREATININE 0.72 0.81 0.82 0.78  GFRNONAA >60 >60 >60 >60  GFRAA >60 >60 >60 >60    LIVER FUNCTION TESTS: Recent Labs    09/09/18 1257 10/03/18 0936 10/26/18 0900 11/23/18 0930   BILITOT 0.7 0.5 0.3 0.4  AST 26 23 22 22   ALT 20 33 32 31  ALKPHOS 117 108 101 97  PROT 7.2 7.7 7.2 6.9  ALBUMIN 4.4 4.4 4.0 3.8    Assessment and Plan: Pt with history of endometrial carcinoma, status post surgery and completion of chemotherapy.  She presents today for Port-A-Cath removal.  Details/risks of procedure, including but not limited to, internal bleeding, infection, injury to adjacent structures discussed with patient with her understanding and consent.   Electronically Signed: D. Rowe Robert, PA-C 12/01/2018, 8:37 AM   I spent a total of 20 minutes at the the patient's bedside AND on the patient's hospital floor or unit, greater than 50% of which was counseling/coordinating care for Port-A-Cath removal

## 2018-12-01 NOTE — Telephone Encounter (Signed)
Received call from [facility] requesting our fax number to send Jordan Martinez's records.  Fax number given and records for endoscopy and colonoscopy received.

## 2018-12-01 NOTE — Procedures (Signed)
Pre Procedural Dx: Poor venous access Post Procedural Dx: Same  Successful removal of anterior chest wall port-a-cath.  EBL: Minimal  No immediate post procedural complications.   Jay Valen Mascaro, MD Pager #: [phone]   

## 2018-12-01 NOTE — Discharge Instructions (Signed)
Implanted Port Removal, Care After °This sheet gives you information about how to care for yourself after your procedure. Your health care provider may also give you more specific instructions. If you have problems or questions, contact your health care provider. °What can I expect after the procedure? °After the procedure, it is common to have: °· Soreness or pain near your incision. °· Some swelling or bruising near your incision. °Follow these instructions at home: °Medicines °· Take over-the-counter and prescription medicines only as told by your health care provider. °· If you were prescribed an antibiotic medicine, take it as told by your health care provider. Do not stop taking the antibiotic even if you start to feel better. °Bathing °· Do not take baths, swim, or use a hot tub until your health care provider approves. Ask your health care provider if you can take showers. You may only be allowed to take sponge baths.  You may shower tomorrow. °Incision care ° °· Follow instructions from your health care provider about how to take care of your incision. Make sure you: °? Wash your hands with soap and water before you change your bandage (dressing). If soap and water are not available, use hand sanitizer. °? Change your dressing as told by your health care provider.  You may remove your dressing tomorrow. °? Keep your dressing dry. °? Leave stitches (sutures), skin glue, or adhesive strips in place. These skin closures may need to stay in place for 2 weeks or longer. If adhesive strip edges start to loosen and curl up, you may trim the loose edges. Do not remove adhesive strips completely unless your health care provider tells you to do that. °· Check your incision area every day for signs of infection. Check for: °? More redness, swelling, or pain. °? More fluid or blood. °? Warmth. °? Pus or a bad smell. °Driving ° °· Do not drive for 24 hours if you were given a medicine to help you relax (sedative) during  your procedure. °· If you did not receive a sedative, ask your health care provider when it is safe to drive. °Activity °· Return to your normal activities as told by your health care provider. Ask your health care provider what activities are safe for you. °· Do not lift anything that is heavier than 10 lb (4.5 kg), or the limit that you are told, until your health care provider says that it is safe. °· Do not do activities that involve lifting your arms over your head. °General instructions °· Do not use any products that contain nicotine or tobacco, such as cigarettes and e-cigarettes. These can delay healing. If you need help quitting, ask your health care provider. °· Keep all follow-up visits as told by your health care provider. This is important. °Contact a health care provider if: °· You have more redness, swelling, or pain around your incision. °· You have more fluid or blood coming from your incision. °· Your incision feels warm to the touch. °· You have pus or a bad smell coming from your incision. °· You have pain that is not relieved by your pain medicine. °Get help right away if you have: °· A fever or chills. °· Chest pain. °· Difficulty breathing. °Summary °· After the procedure, it is common to have pain, soreness, swelling, or bruising near your incision. °· If you were prescribed an antibiotic medicine, take it as told by your health care provider. Do not stop taking the antibiotic even if   you start to feel better. °· Do not drive for 24 hours if you were given a sedative during your procedure. °· Return to your normal activities as told by your health care provider. Ask your health care provider what activities are safe for you. °This information is not intended to replace advice given to you by your health care provider. Make sure you discuss any questions you have with your health care provider. °Document Released: 06/03/2015 Document Revised: 08/05/2017 Document Reviewed: 08/05/2017 °Elsevier  Interactive Patient Education © 2019 [employer] Inc. ° °

## 2018-12-05 HISTORY — PX: COLONOSCOPY: SHX174

## 2018-12-09 ENCOUNTER — Encounter: Payer: Self-pay | Admitting: Gastroenterology

## 2018-12-09 ENCOUNTER — Ambulatory Visit (INDEPENDENT_AMBULATORY_CARE_PROVIDER_SITE_OTHER): Payer: Medicare Other | Admitting: Gastroenterology

## 2018-12-09 ENCOUNTER — Other Ambulatory Visit: Payer: Self-pay

## 2018-12-09 VITALS — Ht 64.0 in | Wt 194.0 lb

## 2018-12-09 DIAGNOSIS — Z1509 Genetic susceptibility to other malignant neoplasm: Secondary | ICD-10-CM

## 2018-12-09 DIAGNOSIS — K219 Gastro-esophageal reflux disease without esophagitis: Secondary | ICD-10-CM | POA: Diagnosis not present

## 2018-12-09 MED ORDER — PEG-KCL-NACL-NASULF-NA ASC-C 140 G PO SOLR: 1.0000 | ORAL | 0 refills | Status: DC

## 2018-12-09 MED ORDER — OMEPRAZOLE 40 MG PO CPDR: 40.0000 mg | DELAYED_RELEASE_CAPSULE | Freq: Every day | ORAL | 3 refills | Status: AC

## 2018-12-09 MED ORDER — NA SULFATE-K SULFATE-MG SULF 17.5-3.13-1.6 GM/177ML PO SOLN: 1.0000 | ORAL | 0 refills | Status: DC

## 2018-12-09 NOTE — Patient Instructions (Addendum)
Reflux and lifestyle and dietary modification reviewed including: Take your proton pump inhibitor medication 30 minutes before meals Avoid any dietary triggers Avoid spicy and acidic foods Limit your intake of coffee, tea, alcohol, and carbonated drinks Work to maintain a healthy weight Keep the head of the bed elevated with blocks if you are having any nighttime symptoms Stay upright for 2 hours after eating Avoid meals and snacks three to four hours before bedtime  I have recommended increasing your omeprazole to 40 mg daily.   I have recommended an EGD and colonoscopy.  You may take one of the Zofran that you have at home prior to starting the colon prep in an effort to prevent the nausea that you have had when getting ready for previous colonoscopies.   Please call with any questions or concerns in the meantime.   Thank you for your patience with me and our technology today! Please stay home, safe, and healthy. I look forward to meeting you in person in the future.

## 2018-12-09 NOTE — Progress Notes (Signed)
TELEHEALTH VISIT  Referring Provider: Javier Glazier, MD Primary Care Physician:  Javier Glazier, MD   Tele-visit due to COVID-19 pandemic Patient requested visit virtually, consented to the virtual encounter via audio enabled telemedicine application (Doxim[employer]) Contact made at: 08:35 12/09/18 Patient verified by name and date of birth Location of patient: Home Location provider: Home Names of persons participating: Me, patient, Tinnie Gens CMA Time spent on telehealth visit: 30 minutes I discussed the limitations of evaluation and management by telemedicine. The patient expressed understanding and agreed to proceed.  Reason for Consultation:  Lynch syndrome   IMPRESSION:  MSH6-related Lynch syndrome Reflux despite daily PPI Stage 1A grade 3 endometrial cancer  Family history of colon cancer (daughter at age 4) Personal history of hyperplastic polyps on colonoscopy in [place]  The risk for colon cancer in individuals with an MSH6 mutation is about 14-55%. Recommendations include colonoscopy every 1-2 years beginning at age 3-25 or 2-5 years prior to the earliest colon cancer diagnosis, whichever comes first.  While there is no clear evidence to support screening for stomach and small bowel cancer, an upper endoscopy can be considered at 3-5 year intervals beginning at age 56.   She reports ongoing reflux despite daily omeprazole 20 mg.  We reviewed lifestyle modifications.  Will increase her omeprazole to 40 mg daily.  Plan esophageal evaluation during her upper endoscopy.  PLAN: Increase omeprazole to 40 mg QAM Reviewed reflux lifestyle and dietary modifications Colonoscopy EGD with esophageal biopsies  I consented the patient discussing the risks, benefits, and alternatives to endoscopic evaluation. In particular, we discussed the risks that include, but are not limited to, reaction to medication, cardiopulmonary compromise, bleeding requiring blood transfusion,  aspiration resulting in pneumonia, perforation requiring surgery, lack of diagnosis, severe illness requiring hospitalization, and even death. We reviewed the risk of missed lesion including polyps or even cancer. The patient acknowledges these risks and asks that we proceed.   HPI: Jordan Martinez is a 68 y.o. female relocated from [place] in June.  Retired Engineer, drilling.  She has diabetes, hypertension, and a family history of genetic mutation for hereditary nonpolyposis colorectal cancer consistent with Lynch Syndrome.   Oldest daughter was diagnosed with colon cancer at age 47. She was found to have MSH6. The patient was tested when she developed anemia last year and was ultimately diagnosed with stage 1A grade 3 endometrial cancer and just finished chemotherapy. She was found to have Lynch Syndrome.  Last endoscopy performed in 2018 in [place]. Path results from 07/29/16: normal duodenal, H pylori negative gastritis, normal esophageal biopsies, and three hyperplastic polyps. Mild nausea with the prep. Tolerated the procedure and the anesthesia.   Ongoing GERD despite daily omeprazole. CT scan shows a hiatal hernia with more than 50% of the stomach. Currently taking omeprazole 20 mg QAM.  No dysphagia, odynophagia, dysphonia, sore throat, abdominal pain, changes in bowel habits, or blood in the stool. No other associated symptoms. No identified exacerbating or relieving features.   From 12/01/2018 show a hemoglobin of 13.5, MCV 106.8, RDW 15.1, platelets 355.  Adopted, so family history is unknown. No known family history of colon cancer or polyps beyond her daughter. No known family history of uterine/endometrial cancer, pancreatic cancer or gastric/stomach cancer.  Past Medical History:  Diagnosis Date   Anxiety    Arthritis    Depression    Diabetes mellitus without complication ([place])    diet controlled. States always has borderline HgbA1C   Endometrial cancer (  [place])    Family history of colon  cancer    Family history of genetic mutation for hereditary nonpolyposis colorectal cancer (HNPCC)    GERD (gastroesophageal reflux disease)    High cholesterol    History of kidney stones    years    Hypertension    Neuropathy    Sensation of pressure in bladder area    Uterine cramping     Past Surgical History:  Procedure Laterality Date   burnt the nerves  Right 04/29/2018   in right knee    burnt the nerves  Left 04/22/2018   left knee nerve    cystoscopy kidney stone   1985   IR IMAGING GUIDED PORT INSERTION  06/15/2018   IR REMOVAL TUN ACCESS W/ PORT W/O FL MOD SED  12/01/2018   REPLACEMENT TOTAL KNEE BILATERAL Bilateral 2018   Right x 2. 2019 undergoing intermittent injection "RFA" procedures with Dr. Sheliah Mends [facility]   REVISION TOTAL KNEE ARTHROPLASTY Right 03/2017   ROBOTIC ASSISTED TOTAL HYSTERECTOMY WITH BILATERAL SALPINGO OOPHERECTOMY N/A 05/03/2018   Procedure: XI ROBOTIC ASSISTED TOTAL HYSTERECTOMY WITH BILATERAL SALPINGO OOPHORECTOMY;  Surgeon: Everitt Amber, MD;  Location[facility];  Service: Gynecology;  Laterality: N/A;   SENTINEL NODE BIOPSY N/A 05/03/2018   Procedure: SENTINEL NODE BIOPSY;  Surgeon: Everitt Amber, MD;  Location: [facility];  Service: Gynecology;  Laterality: N/A;   TONSILLECTOMY AND ADENOIDECTOMY     TUBAL LIGATION      Current Outpatient Medications  Medication Sig Dispense Refill   acetaminophen (TYLENOL) 325 MG tablet Take 2 tablets (650 mg total) by mouth every 6 (six) hours as needed for mild pain, fever or headache (or Fever >/= 101). 15 tablet 0   aspirin EC 81 MG tablet Take 81 mg by mouth daily.     atenolol (TENORMIN) 25 MG tablet Take 2 tablets (50 mg total) by mouth at bedtime. For BP     atorvastatin (LIPITOR) 20 MG tablet Take 20 mg by mouth at bedtime.   0   cholecalciferol (VITAMIN D) 400 units TABS tablet Take 400 Units by mouth daily.     cyclobenzaprine (FLEXERIL) 5 MG tablet Take 1 tablet (5 mg  total) by mouth 3 (three) times daily. 90 tablet 1   hydrALAZINE (APRESOLINE) 50 MG tablet Take 0.5 tablets (25 mg total) by mouth 3 (three) times daily. 90 tablet 1   losartan (COZAAR) 50 MG tablet Take 0.5 tablets (25 mg total) by mouth daily. 30 tablet 11   metFORMIN (GLUCOPHAGE) 500 MG tablet TAKE 1 TABLET BY MOUTH TWICE A DAY WITH MEALS 60 tablet 1   nystatin (MYCOSTATIN/NYSTOP) powder Apply topically 4 (four) times daily. 15 g 1   Omega-3 Fatty Acids (FISH OIL) 1200 MG CAPS Take 1,200 mg by mouth daily.      omeprazole (PRILOSEC) 20 MG capsule Take 1 capsule (20 mg total) by mouth daily. 30 capsule 2   oxyCODONE-acetaminophen (PERCOCET/ROXICET) 5-325 MG tablet Take 2 tablets by mouth every 6 (six) hours as needed for moderate pain or severe pain. 60 tablet 0   promethazine (PHENERGAN) 25 MG tablet Take 1 tablet (25 mg total) by mouth every 6 (six) hours as needed for nausea or vomiting. 30 tablet 0   Propylene Glycol (SYSTANE BALANCE) 0.6 % SOLN Place 1 drop into both eyes every morning.     senna-docusate (SENOKOT-S) 8.6-50 MG tablet Take 2 tablets by mouth at bedtime. 60 tablet 1   No current facility-administered medications  for this visit.     Allergies as of 12/09/2018 - Review Complete 12/01/2018  Allergen Reaction Noted   Amlodipine  03/31/2018   Compazine [prochlorperazine edisylate]  03/31/2018   Gabapentin  03/31/2018   Lyrica [pregabalin]  03/31/2018   Sertraline Other (See Comments) 04/12/2018   Chlorhexidine Rash 08/01/2018    Family History  Adopted: Yes  Problem Relation Age of Onset   Colon cancer Daughter 69       Lynch syndrome - MSH6 +   Healthy Son     Social History   Socioeconomic History   Marital status: Divorced    Spouse name: Not on file   Number of children: 2   Years of education: Not on file   Highest education level: Not on file  Occupational History   Occupation: retired Editor, commissioning  strain: Not on file   Food insecurity:    Worry: Not on file    Inability: Not on file   Transportation needs:    Medical: Not on file    Non-medical: Not on file  Tobacco Use   Smoking status: Current Every Day Smoker    Packs/day: 0.50    Years: 40.00    Pack years: 20.00    Types: Cigarettes   Smokeless tobacco: Never Used   Tobacco comment: 4-5 loose cigarettes per day , over 20 year hx of smoking   Substance and Sexual Activity   Alcohol use: Yes    Comment: 2 glasses wine daily.    Drug use: Never   Sexual activity: Not Currently    Birth control/protection: None  Lifestyle   Physical activity:    Days per week: Not on file    Minutes per session: Not on file   Stress: Not on file  Relationships   Social connections:    Talks on phone: Not on file    Gets together: Not on file    Attends religious service: Not on file    Active member of club or organization: Not on file    Attends meetings of clubs or organizations: Not on file    Relationship status: Not on file   Intimate partner violence:    Fear of current or ex partner: Not on file    Emotionally abused: Not on file    Physically abused: Not on file    Forced sexual activity: Not on file  Other Topics Concern   Not on file  Social History Narrative   Not on file    Review of Systems: ALL ROS discussed and all others negative except listed in HPI.  Physical Exam: General: in no acute distress Neuro: Alert and appropriate Psych: Normal affect and normal insight   Roniqua Kintz L. Tarri Glenn, MD, MPH [facility] 12/09/2018, 8:30 AM

## 2018-12-12 ENCOUNTER — Telehealth: Payer: Self-pay | Admitting: Oncology

## 2018-12-12 ENCOUNTER — Other Ambulatory Visit: Payer: Self-pay | Admitting: Hematology and Oncology

## 2018-12-12 NOTE — Telephone Encounter (Signed)
Called Jordan Martinez and let her know that she can pick up the CD's of her past mammograms.  She verbalized agreement and then said she accidentally shredded the paper Dr. Alvy Bimler gave her with the instructions for weaning off her BP medications.  She has stopped taking hydralazine as instructed and is now taking Losartan every other day as of 12/05/18.  She is wondering when she should stop Losartan and then how long she will need to take Atenolol.

## 2018-12-13 NOTE — Telephone Encounter (Addendum)
Called Jordan Martinez back and advised her of message from Dr. Alvy Bimler.  She said her BP was 123/82 on Saturday and 130/83 on Sunday.  She verbalized understanding and agreement of all instructions for her BP medications.

## 2018-12-13 NOTE — Telephone Encounter (Signed)
Are her BP OK now? If BP readings are less than 671 Systolic, she can stop losartan Atenolol she can cut to half to start with for 1 week then every other day and then stop She needs PCP follow-up to monitor her BP long term

## 2018-12-14 ENCOUNTER — Other Ambulatory Visit: Payer: Self-pay | Admitting: Hematology and Oncology

## 2018-12-15 ENCOUNTER — Other Ambulatory Visit: Payer: Self-pay | Admitting: Hematology and Oncology

## 2018-12-17 ENCOUNTER — Other Ambulatory Visit: Payer: Self-pay | Admitting: Hematology and Oncology

## 2018-12-19 ENCOUNTER — Telehealth: Payer: Self-pay

## 2018-12-19 NOTE — Telephone Encounter (Signed)
I recommend reduce Atenolol to 25 mg daily (she may cut it to half, or call in 25 mg dose if she cannot cut) DC lisinopril We will call her again in about 2 weeks to follow

## 2018-12-19 NOTE — Telephone Encounter (Signed)
Spoke with pt by phone. Her PCP changed her BP meds on 6/10. BP readings since then are as follows: 6/11:  135/86 6/12:  128/70 6/13:  136/82 6/14:  125/76 Has been checking BP between 0900-1200 each day. Current BP meds are Atenolol 50mg  daily and Lisinopril 5mg  daily. Epic med list has been udated. She also wants you to know she is scheduled for endoscopy and colonoscopy on 6/19, she sees Pain Management on 6/15, and her mammo is scheduled for July.

## 2018-12-19 NOTE — Telephone Encounter (Signed)
-----   Message from Heath Lark, MD sent at 12/19/2018  7:26 AM EDT ----- Regarding: BP taper Pls verify her current medications she is taking and update on epic How is her BP doing? Planning to wean her off all BP meds if SBP <140. Also, remind her PCP follow-up for DM and BP management long term

## 2018-12-19 NOTE — Telephone Encounter (Signed)
Spoke with pt by phone and gave below msg. Pt prefers to keep BP meds as they are and have Dr Antony Salmon manage her BP and meds. Pt understands that she will need to contact Dr Antony Salmon with any BP issues.

## 2018-12-22 ENCOUNTER — Telehealth: Payer: Self-pay | Admitting: Gastroenterology

## 2018-12-22 DIAGNOSIS — M25569 Pain in unspecified knee: Secondary | ICD-10-CM | POA: Insufficient documentation

## 2018-12-22 NOTE — Telephone Encounter (Signed)

## 2018-12-23 ENCOUNTER — Ambulatory Visit (AMBULATORY_SURGERY_CENTER): Payer: Medicare Other | Admitting: Gastroenterology

## 2018-12-23 ENCOUNTER — Other Ambulatory Visit: Payer: Self-pay

## 2018-12-23 ENCOUNTER — Encounter: Payer: Self-pay | Admitting: Gastroenterology

## 2018-12-23 VITALS — BP 141/73 | HR 61 | Temp 98.4°F | Resp 18 | Ht 64.0 in | Wt 194.0 lb

## 2018-12-23 DIAGNOSIS — Z86010 Personal history of colonic polyps: Secondary | ICD-10-CM

## 2018-12-23 DIAGNOSIS — Z1509 Genetic susceptibility to other malignant neoplasm: Secondary | ICD-10-CM

## 2018-12-23 DIAGNOSIS — D124 Benign neoplasm of descending colon: Secondary | ICD-10-CM

## 2018-12-23 DIAGNOSIS — D120 Benign neoplasm of cecum: Secondary | ICD-10-CM | POA: Diagnosis not present

## 2018-12-23 DIAGNOSIS — Z800 Family history of malignant neoplasm of digestive organs: Secondary | ICD-10-CM

## 2018-12-23 DIAGNOSIS — K3189 Other diseases of stomach and duodenum: Secondary | ICD-10-CM | POA: Diagnosis not present

## 2018-12-23 DIAGNOSIS — K2970 Gastritis, unspecified, without bleeding: Secondary | ICD-10-CM

## 2018-12-23 DIAGNOSIS — K449 Diaphragmatic hernia without obstruction or gangrene: Secondary | ICD-10-CM

## 2018-12-23 DIAGNOSIS — K219 Gastro-esophageal reflux disease without esophagitis: Secondary | ICD-10-CM

## 2018-12-23 DIAGNOSIS — K210 Gastro-esophageal reflux disease with esophagitis: Secondary | ICD-10-CM | POA: Diagnosis not present

## 2018-12-23 MED ORDER — SODIUM CHLORIDE 0.9 % IV SOLN: 500.0000 mL | Freq: Once | INTRAVENOUS | Status: DC

## 2018-12-23 NOTE — Progress Notes (Signed)
PT taken to PACU. Monitors in place. VSS. Report given to RN. 

## 2018-12-23 NOTE — Op Note (Signed)
[facility] Patient Name: Jordan Martinez Procedure Date: 12/23/2018 2:51 PM MRN: [id number] Endoscopist: Thornton Park MD, MD Age: 68 Referring MD:  Date of Birth: 15-Aug-1950 Gender: Female Account #: [id number] Procedure:                Upper GI endoscopy Indications:              Esophageal reflux symptoms that persist despite                            appropriate therapy\                           MSH6-related Lynch syndrome                           Reflux despite daily PPI Medicines:                See the Anesthesia note for documentation of the                            administered medications Procedure:                Pre-Anesthesia Assessment:                           - Prior to the procedure, a History and Physical                            was performed, and patient medications and                            allergies were reviewed. The patient's tolerance of                            previous anesthesia was also reviewed. The risks                            and benefits of the procedure and the sedation                            options and risks were discussed with the patient.                            All questions were answered, and informed consent                            was obtained. Prior Anticoagulants: The patient has                            taken no previous anticoagulant or antiplatelet                            agents. ASA Grade Assessment: II - A patient with  mild systemic disease. After reviewing the risks                            and benefits, the patient was deemed in                            satisfactory condition to undergo the procedure.                           After obtaining informed consent, the endoscope was                            passed under direct vision. Throughout the                            procedure, the patient's blood pressure, pulse, and                            oxygen  saturations were monitored continuously. The                            Endoscope was introduced through the mouth, and                            advanced to the third part of duodenum. The upper                            GI endoscopy was accomplished without difficulty.                            The patient tolerated the procedure well. Scope In: Scope Out: Findings:                 LA Grade A (one or more mucosal breaks less than 5                            mm, not extending between tops of 2 mucosal folds)                            esophagitis with no bleeding was found. Biopsies                            were taken with a cold forceps for histology from                            the distal esophagus and from the mid and proximal                            esophagus. .                           Diffuse mildly erythematous mucosa without bleeding  was found in the gastric body. Biopsies were taken                            with a cold forceps for histology.                           A small hiatal hernia was present.                           Diffuse mildly erythematous mucosa without active                            bleeding and with no stigmata of bleeding was found                            in the duodenal bulb. Biopsies were taken with a                            cold forceps for histology.                           The cardia and gastric fundus were normal on                            retroflexion.                           The exam was otherwise without abnormality. Complications:            No immediate complications. Estimated blood loss:                            Minimal. Estimated Blood Loss:     Estimated blood loss was minimal. Impression:               - LA Grade A reflux esophagitis. Biopsied.                           - Erythematous mucosa in the gastric body. Biopsied.                           - Small hiatal hernia.                            - Erythematous duodenopathy. Biopsied.                           - The examination was otherwise normal. Recommendation:           - Await pathology results.                           - Resume regular diet today.                           - Continue present medications. Continue omeprazole  40 mg daily.                           - Avoid all NSAIDs.                           - Repeat upper endoscopy in 3 years for                            surveillance. Thornton Park MD, MD 12/23/2018 3:47:21 PM This report has been signed electronically.

## 2018-12-23 NOTE — Progress Notes (Signed)
Called to room to assist during endoscopic procedure.  Patient ID and intended procedure confirmed with present staff. Received instructions for my participation in the procedure from the performing physician.  

## 2018-12-23 NOTE — Patient Instructions (Signed)
YOU HAD AN ENDOSCOPIC PROCEDURE TODAY AT THE [facility]:   Refer to the procedure report that was given to you for any specific questions about what was found during the examination.  If the procedure report does not answer your questions, please call your gastroenterologist to clarify.  If you requested that your care partner not be given the details of your procedure findings, then the procedure report has been included in a sealed envelope for you to review at your convenience later.  YOU SHOULD EXPECT: Some feelings of bloating in the abdomen. Passage of more gas than usual.  Walking can help get rid of the air that was put into your GI tract during the procedure and reduce the bloating. If you had a lower endoscopy (such as a colonoscopy or flexible sigmoidoscopy) you may notice spotting of blood in your stool or on the toilet paper. If you underwent a bowel prep for your procedure, you may not have a normal bowel movement for a few days.  Please Note:  You might notice some irritation and congestion in your nose or some drainage.  This is from the oxygen used during your procedure.  There is no need for concern and it should clear up in a day or so.  SYMPTOMS TO REPORT IMMEDIATELY:   Following lower endoscopy (colonoscopy or flexible sigmoidoscopy):  Excessive amounts of blood in the stool  Significant tenderness or worsening of abdominal pains  Swelling of the abdomen that is new, acute  Fever of 100F or higher   Following upper endoscopy (EGD)  Vomiting of blood or coffee ground material  New chest pain or pain under the shoulder blades  Painful or persistently difficult swallowing  New shortness of breath  Fever of 100F or higher  Black, tarry-looking stools  For urgent or emergent issues, a gastroenterologist can be reached at any hour by calling [phone].   DIET:  We do recommend a small meal at first, but then you may proceed to your regular diet.  Drink  plenty of fluids but you should avoid alcoholic beverages for 24 hours.  ACTIVITY:  You should plan to take it easy for the rest of today and you should NOT DRIVE or use heavy machinery until tomorrow (because of the sedation medicines used during the test).    FOLLOW UP: Our staff will call the number listed on your records 48-72 hours following your procedure to check on you and address any questions or concerns that you may have regarding the information given to you following your procedure. If we do not reach you, we will leave a message.  We will attempt to reach you two times.  During this call, we will ask if you have developed any symptoms of COVID 19. If you develop any symptoms (ie: fever, flu-like symptoms, shortness of breath, cough etc.) before then, please call [phone].  If you test positive for Covid 19 in the 2 weeks post procedure, please call and report this information to us.    If any biopsies were taken you will be contacted by phone or by letter within the next 1-3 weeks.  Please call us at [phone] if you have not heard about the biopsies in 3 weeks.    SIGNATURES/CONFIDENTIALITY: You and/or your care partner have signed paperwork which will be entered into your electronic medical record.  These signatures attest to the fact that that the information above on your After Visit Summary has been reviewed and is   understood.  Full responsibility of the confidentiality of this discharge information lies with you and/or your care-partner.    Please see handout on polyps.   Thank you for allowing [place] to provide your healthcare today.

## 2018-12-23 NOTE — Op Note (Signed)
[facility] Patient Name: Jordan Martinez Procedure Date: 12/23/2018 2:51 PM MRN: [id number] Endoscopist: Thornton Park MD, MD Age: 68 Referring MD:  Date of Birth: 1950/09/03 Gender: Female Account #: [id number] Procedure:                Colonoscopy Indications:              Lynch Syndrome                           MSH6-related Lynch syndrome                           Reflux despite daily PPI                           Stage 1A grade 3 endometrial cancer                           Family history of colon cancer (daughter at age 63)                           Personal history of hyperplastic polyps on                            colonoscopy in [place] Medicines:                See the Anesthesia note for documentation of the                            administered medications Procedure:                Pre-Anesthesia Assessment:                           - Prior to the procedure, a History and Physical                            was performed, and patient medications and                            allergies were reviewed. The patient's tolerance of                            previous anesthesia was also reviewed. The risks                            and benefits of the procedure and the sedation                            options and risks were discussed with the patient.                            All questions were answered, and informed consent                            was obtained. Prior Anticoagulants: The patient has  taken no previous anticoagulant or antiplatelet                            agents. ASA Grade Assessment: II - A patient with                            mild systemic disease. After reviewing the risks                            and benefits, the patient was deemed in                            satisfactory condition to undergo the procedure.                           After obtaining informed consent, the colonoscope                             was passed under direct vision. Throughout the                            procedure, the patient's blood pressure, pulse, and                            oxygen saturations were monitored continuously. The                            Colonoscope was introduced through the anus and                            advanced to the the terminal ileum, with                            identification of the appendiceal orifice and IC                            valve. The colonoscopy was performed without                            difficulty. The patient tolerated the procedure                            well. The quality of the bowel preparation was                            good. The terminal ileum, ileocecal valve,                            appendiceal orifice, and rectum were photographed. Scope In: 3:24:08 PM Scope Out: 3:40:16 PM Scope Withdrawal Time: 0 hours 13 minutes 6 seconds  Total Procedure Duration: 0 hours 16 minutes 8 seconds  Findings:                 The perianal and digital rectal examinations were  normal.                           Three flat polyps were found in the ascending                            colon. The polyps were 1 to 2 mm in size. These                            polyps were removed with a cold biopsy forceps.                            Resection and retrieval were complete.                           A 3 mm polyp was found in the descending colon. The                            polyp was sessile. The polyp was removed with a                            cold snare. Resection and retrieval were complete.                            Estimated blood loss was minimal.                           Multiple small and large-mouthed diverticula were                            found in the left colon.                           The exam was otherwise without abnormality on                            direct and retroflexion  views. Complications:            No immediate complications. Estimated blood loss:                            Minimal. Estimated Blood Loss:     Estimated blood loss was minimal. Impression:               - Three 1 to 2 mm polyps in the ascending colon,                            removed with a cold biopsy forceps. Resected and                            retrieved.                           - One 3 mm polyp in the descending colon, removed  with a cold snare. Resected and retrieved.                           - Diverticulosis in the left colon.                           - The examination was otherwise normal on direct                            and retroflexion views. Recommendation:           - Discharge patient to home.                           - Resume previous diet today. High fiber diet                            recommended.                           - Continue present medications.                           - Await pathology results.                           - Repeat colonoscopy in 1 year for surveillance. Thornton Park MD, MD 12/23/2018 3:51:52 PM This report has been signed electronically.

## 2018-12-27 ENCOUNTER — Telehealth: Payer: Self-pay

## 2018-12-27 NOTE — Telephone Encounter (Signed)
Please obtain more information from the patient regarding her lip and blister. This is not an expected complication from an EGD. Thank you.

## 2018-12-27 NOTE — Telephone Encounter (Signed)
  Follow up Call-  Call back number 12/23/2018  Post procedure Call Back phone  # [phone]  Permission to leave phone message Yes  Some recent data might be hidden     Patient questions:  Do you have a fever, pain , or abdominal swelling? No. Pain Score  0 *  Have you tolerated food without any problems? Yes.    Have you been able to return to your normal activities? Yes.    Do you have any questions about your discharge instructions: Diet   No. Medications  No. Follow up visit  No.  Do you have questions or concerns about your Care? Yes.   Has blister on inside of lip  "tiny but bigger than it was when I left the hospital" RN advised pt to watch for signs of infection (redness, colored drainage such as yellow or green, any changes that concern pt)  Pt agreed Actions: * If pain score is 4 or above: No action needed, pain <4.  1. Have you developed a fever since your procedure? no  2.   Have you had an respiratory symptoms (SOB or cough) since your procedure? no  3.   Have you tested positive for COVID 19 since your procedure no  4.   Have you had any family members/close contacts diagnosed with the COVID 19 since your procedure?  no   If yes to any of these questions please route to [name], RN and Alphonsa Gin, Therapist, sports.

## 2018-12-27 NOTE — Telephone Encounter (Signed)
Would she like to discuss it with me? I've had a cancellation this morning and we could review her concerns if she wishes. Thank you.

## 2018-12-27 NOTE — Telephone Encounter (Signed)
Spoke to the patient who reports a "blister" smaller than the tip of a pen on the inside of the lower lip (left of center) that the patient describes as "itty bitty," non fluid filled, pink in color (same as her normal mucosa), denies redness, oozing or bleeding), slight pain when eating certain foods or drinking hot liquids. The patient said she noticed this blister as soon as she woke from sedation on Friday and pointed this out to the recovery nurse. The patient states that the area on her lip has progressively gotten smaller since Friday.

## 2018-12-27 NOTE — Telephone Encounter (Signed)
Many thanks for the follow-up. I'd be happy to see her if things don't continue to improve.

## 2018-12-27 NOTE — Telephone Encounter (Signed)
Left message for patient to call back  

## 2018-12-27 NOTE — Telephone Encounter (Signed)
The patient stated that she didn't think that she needed to talk about it. 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 The thought it was very nice that Dr. Tarri Glenn offered to speak about it but she declined the offer saying she didn't think it was necessary. Nothing further at the time of the call.

## 2018-12-28 ENCOUNTER — Telehealth: Payer: Self-pay | Admitting: Gastroenterology

## 2018-12-28 ENCOUNTER — Other Ambulatory Visit: Payer: Self-pay | Admitting: *Deleted

## 2018-12-28 DIAGNOSIS — K219 Gastro-esophageal reflux disease without esophagitis: Secondary | ICD-10-CM

## 2018-12-28 NOTE — Telephone Encounter (Signed)
Spoke to patient. See result note.

## 2018-12-28 NOTE — Telephone Encounter (Signed)
Patient is about to have a melt down about know if she has cancer again. She is returning your call regarding her pathology results.

## 2018-12-30 ENCOUNTER — Other Ambulatory Visit: Payer: Medicare Other

## 2018-12-30 DIAGNOSIS — K219 Gastro-esophageal reflux disease without esophagitis: Secondary | ICD-10-CM

## 2019-01-04 DIAGNOSIS — K635 Polyp of colon: Secondary | ICD-10-CM | POA: Insufficient documentation

## 2019-01-04 LAB — TEST AUTHORIZATION

## 2019-01-04 LAB — TISSUE TRANSGLUTAMINASE, IGA: (tTG) Ab, IgA: 1 U/mL

## 2019-01-04 LAB — IGA: Immunoglobulin A: 208 mg/dL (ref 70–320)

## 2019-01-12 ENCOUNTER — Other Ambulatory Visit: Payer: Self-pay | Admitting: Hematology and Oncology

## 2019-02-03 ENCOUNTER — Ambulatory Visit (INDEPENDENT_AMBULATORY_CARE_PROVIDER_SITE_OTHER): Payer: Medicare Other | Admitting: Gastroenterology

## 2019-02-03 ENCOUNTER — Encounter: Payer: Self-pay | Admitting: Gastroenterology

## 2019-02-03 VITALS — Ht 64.0 in | Wt 194.0 lb

## 2019-02-03 DIAGNOSIS — R857 Abnormal histological findings in specimens from digestive organs and abdominal cavity: Secondary | ICD-10-CM | POA: Diagnosis not present

## 2019-02-03 DIAGNOSIS — Z1509 Genetic susceptibility to other malignant neoplasm: Secondary | ICD-10-CM | POA: Diagnosis not present

## 2019-02-03 NOTE — Patient Instructions (Signed)
Continue taking Omeprazole 40 mg every morning while on Celebrex.   Patient advised to avoid spicy, acidic, citrus, chocolate, mints, fruit and fruit juices.  Limit the intake of caffeine, alcohol and Soda.  Don't exercise too soon after eating.  Don't lie down within 3-4 hours of eating.  Elevate the head of your bed.  You will be due for a recall colonoscopy in 12/2019. We will send you a reminder in the mail when it gets closer to that time.  You will be due for a recall endoscopy in 12/2021. We will send you a reminder in the mail when it gets closer to that time.  Your provider has requested that you go to the basement level for lab work at your convenience. Press "B" on the elevator. The lab is located at the first door on the left as you exit the elevator.
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
## 2019-02-05 ENCOUNTER — Encounter: Payer: Self-pay | Admitting: Gastroenterology

## 2019-02-07 ENCOUNTER — Telehealth: Payer: Self-pay | Admitting: Oncology

## 2019-02-07 ENCOUNTER — Other Ambulatory Visit: Payer: Self-pay | Admitting: Hematology and Oncology

## 2019-02-07 NOTE — Telephone Encounter (Signed)
Called Jordan Martinez regarding her medications refill requests.  She said her PCP is refilling all her medications and to disregard any refill requests that Dr. Alvy Bimler may receive.  She will talk to [facility] the next time she is there to stop any future refill requests.

## 2019-02-08 ENCOUNTER — Other Ambulatory Visit: Payer: Self-pay | Admitting: Oncology

## 2019-02-24 ENCOUNTER — Ambulatory Visit (HOSPITAL_COMMUNITY)
Admission: RE | Admit: 2019-02-24 | Discharge: 2019-02-24 | Disposition: A | Discharge status: Home / Self Care | Payer: Medicare Other | Source: Ambulatory Visit | Attending: Gynecologic Oncology | Admitting: Gynecologic Oncology

## 2019-02-24 ENCOUNTER — Other Ambulatory Visit: Payer: Self-pay

## 2019-02-24 DIAGNOSIS — I700 Atherosclerosis of aorta: Secondary | ICD-10-CM | POA: Diagnosis not present

## 2019-02-24 DIAGNOSIS — K760 Fatty (change of) liver, not elsewhere classified: Secondary | ICD-10-CM | POA: Diagnosis not present

## 2019-02-24 DIAGNOSIS — K5730 Diverticulosis of large intestine without perforation or abscess without bleeding: Secondary | ICD-10-CM | POA: Insufficient documentation

## 2019-02-24 DIAGNOSIS — C541 Malignant neoplasm of endometrium: Secondary | ICD-10-CM | POA: Diagnosis present

## 2019-02-24 DIAGNOSIS — R935 Abnormal findings on diagnostic imaging of other abdominal regions, including retroperitoneum: Secondary | ICD-10-CM | POA: Diagnosis present

## 2019-02-24 DIAGNOSIS — K449 Diaphragmatic hernia without obstruction or gangrene: Secondary | ICD-10-CM | POA: Insufficient documentation

## 2019-02-24 DIAGNOSIS — I2510 Atherosclerotic heart disease of native coronary artery without angina pectoris: Secondary | ICD-10-CM | POA: Diagnosis not present

## 2019-02-24 LAB — GLUCOSE, CAPILLARY: Glucose-Capillary: 129 mg/dL — ABNORMAL HIGH (ref 70–99)

## 2019-02-24 MED ORDER — FLUDEOXYGLUCOSE F - 18 (FDG) INJECTION
9.7700 | Freq: Once | INTRAVENOUS | Status: AC | PRN
  Administered 2019-02-24: 09:00:00 9.77 via INTRAVENOUS

## 2019-03-03 ENCOUNTER — Other Ambulatory Visit: Payer: Self-pay

## 2019-03-03 ENCOUNTER — Inpatient Hospital Stay: Payer: Medicare Other | Attending: Gynecologic Oncology | Admitting: Gynecologic Oncology

## 2019-03-03 ENCOUNTER — Encounter: Payer: Self-pay | Admitting: Gynecologic Oncology

## 2019-03-03 VITALS — BP 158/86 | HR 70 | Temp 98.3°F | Resp 20 | Ht 64.5 in | Wt 198.6 lb

## 2019-03-03 DIAGNOSIS — Z90722 Acquired absence of ovaries, bilateral: Secondary | ICD-10-CM | POA: Diagnosis not present

## 2019-03-03 DIAGNOSIS — Z9221 Personal history of antineoplastic chemotherapy: Secondary | ICD-10-CM | POA: Diagnosis not present

## 2019-03-03 DIAGNOSIS — Z90710 Acquired absence of both cervix and uterus: Secondary | ICD-10-CM | POA: Diagnosis not present

## 2019-03-03 DIAGNOSIS — C541 Malignant neoplasm of endometrium: Secondary | ICD-10-CM | POA: Diagnosis present

## 2019-03-03 DIAGNOSIS — R232 Flushing: Secondary | ICD-10-CM

## 2019-03-03 DIAGNOSIS — Z148 Genetic carrier of other disease: Secondary | ICD-10-CM | POA: Diagnosis not present

## 2019-03-03 NOTE — Patient Instructions (Signed)
Please notify Dr Denman George at phone number [phone] if you notice vaginal bleeding, new pelvic or abdominal pains, bloating, feeling full easy, or a change in bladder or bowel function.   Please return to see Dr Denman George in 3 months.  Dr Denman George can prescribe venlafaxine (Effexor) for your hotflashes if you are interested. Please see the drug information that is attached.

## 2019-03-03 NOTE — Progress Notes (Signed)
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 MSI high, MMR abnormal (loss of [place] 6). Lynch syndrome  S/p 6 cycles platinum and taxane chemotherapy completed April 22nd, 2020.   Post-treatment imaging revealed nodule at vaginal cuff (31m). No concerning findings on examination and stable/benign appearing on follow-up imaging.   Symptomatic vasomotor symptoms of menopause  Plan  68 provided VPaviellewith information regarding the drug effects are.  She will consider this literature and notify [place] she is interested in prescription of this to ameliorate her symptoms of menopause.  I am recommending 68-monthurveillance physical examinations until April 2022 at which time we will transition to 6 monthly evaluations.  She does not appear to have metastatic disease on most recent pet imaging, therefore additional routine screening imaging is not indicated at this time but will be ordered based on development of new symptoms or physical exam findings.   Chief Complaint  Patient presents with  . endometrial cancer    follow-up    GYN Oncologic Summary  HPI: Jordan Martinez a very nice moderately anxious [id number].  P2 who was seen as a consult from Dr HaIhor Martinez  She noted 3 weeks of postmenopausal bleeding.  On 03/30/2018 she reports this was quite heavy with cramps.  [place] and she was unable to find a provider in a timely manner.  She [facility] by the [facility] to see Jordan Martinez Given the patient's symptoms an endometrial biopsy was performed along with a pelvic ultrasound.  Documented exam by Dr. HaIhor Dowtates that there was a lesion at 6:00 on the cervix which was biopsied.  In addition to the  endometrial biopsy being performed.  The endometrial biopsy revealed high-grade carcinoma with a comment stating the majority appears to be FIGO grade 3 endometrioid with scattered foci suggestive of serous phenotype.  The cervix revealed unremarkable endocervical glandular and squamous mucosa there were detached fragments of high-grade carcinoma similar to the endometrial biopsy tissue.  Ultrasound revealed a uterus 6.6 x 3 x 3.7 cm with no fibroids endometrium 1.6 cm right ovary normal left ovary not visualized.  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  A CT scan was performed preop and noted below.  In summary no evidence of extrauterine disease.  She admits to anxiety and has some pressured speech today.  She does note some vague pain and again the primary complaint of the vaginal bleeding.  Given the diagnosis of uterine cancer she was referred for management and recommendations.   Interval Hx:   The patient underwent surgical staging with a robotic assisted total hysterectomy BSO sentinel lymph node biopsy on May 03, 2018.  Due to her symptoms of abdominal bloating, elevated Ca1 25, and her daughter's history of Lynch syndrome her tinea washings were obtained.  Intraoperative findings were otherwise unremarkable and she had no gross extrauterine disease within normal omentum diaphragms and peritoneal surfaces, 6 cm normal-appearing uterus, normal tubes and ovaries, no suspicious nodes.  The pathology revealed positive malignant cells consistent with metastatic adenocarcinoma  in the peritoneal washings.  The histopathology report revealed a FIGO grade 1 endometrioid endometrial adenocarcinoma measuring 2.2 cm and invading less than one half of the myometrium (3 of 12 mm), with no LVSI, and 5- sentinel lymph nodes, with normal cervix and adnexa.  Given  the finding of positive washings, the fallopian tubes and ovaries were subsequently submitted for complete serial sectioning and no occult ovarian or fallopian tube carcinoma was identified.  MSI testing showed high instability, and mismatch repair protein IHC was abnormal with loss of nuclear expression of Benjamin 6.  She was subsequently seen by the genetics counselors on 05/18/2018 who drew blood for genetic screening including Lynch syndrome which was positive (mutation in MSH6).  Given the presence of positive washings and the preoperative biopsy of high grade cancer, she was felt to represent high risk for recurrence, and therefore adjuvant chemotherapy with carboplatin and paclitaxel was recommended.   She received chemotherapy between June 20, 2018 and October 26, 2018.  Chemotherapy was somewhat poorly tolerated by the patient who had severe bone pain, and issues with diabetes and dehydration.  She received 2 cycles of carboplatin with paclitaxel, a third cycle with Taxol at 50%, and then cycle 4-6 without Taxol carboplatin as a single agent.  Post treatment imaging on Nov 23, 2018 revealed an 8 mm hyperattenuating nodule along the midline vaginal cuff this is well-circumscribed.  It appeared related to calcification.  Otherwise no evidence of metastatic disease in the abdomen and pelvis.  A follow-up PET/CT was ordered and performed on 68/21/2022 reassess the area at the vaginal cuff that had previously shown some nodularity and avidity.  On this follow-up PET scan there was no metabolic evidence of recurrent metastatic disease.  There is a stable benign postsurgical change at the hysterectomy margin that had calcified.  No other new disease was visualized.  The PET does show evidence of coronary artery disease and atherosclerosis.  The patient reports very bothersome symptoms of hot flashes that became worse after her surgery.  She is interested potentially in medical therapy for  this.   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 Endometrium, biopsy - HIGH GRADE CARCINOMA, SEE COMMENT. 2. Cervix, biopsy - UNREMARKABLE ENDOCERVICAL GLANDULAR AND SQUAMOUS MUCOSA. - MULTIPLE DETACHED FRAGMENTS OF HIGH GRADE CARCINOMA, MORPHOLOGICALLY SIMILAR TO SPECIMEN 1. Microscopic Comment 1. 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   04/21/2018 Imaging   US pelvis Abnormally thickened endometrium measuring up to 16 mm. In the setting of post-menopausal bleeding, endometrial sampling is indicated to exclude carcinoma. If results are benign, sonohysterogram should be considered for focal lesion work-up.    04/27/2018 Imaging   Ct abdomen and pelvis 1. Thickening of the endometrium identified compatible with endometrial carcinoma. 2. No findings to suggest metastatic adenopathy or distant metastatic disease. 3. Hiatal hernia 4.  Aortic Atherosclerosis (ICD10-I70.0).    05/02/2018 Tumor Marker   Patient's tumor was tested for the following markers: CA-125 Results of the tumor marker test revealed 65.3   05/03/2018 Pathology Results   1. Lymph node, sentinel, biopsy, right obturator - ONE OF ONE LYMPH NODES NEGATIVE FOR CARCINOMA (0/1). 2. Lymph node, sentinel, biopsy, left external iliac - FOUR OF FOUR LYMPH NODES NEGATIVE FOR  CARCINOMA (0/4). 3. 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 TUMOR INVADES LESS THAN ONE HALF OF THE MYOMETRIUM. SEE ONCOLOGY TABLE. -SEROSA: UNREMARKABLE. NO MALIGNANCY. - CERVIX:  BENIGN SQUAMOUS AND ENDOCERVICAL MUCOSA. NO DYSPLASIA OR MALIGNANCY. - BILATERAL OVARIES: INCLUSION CYSTS. NO MALIGNANCY. - BILATERAL FALLOPIAN TUBES: UNREMARKABLE. NO MALIGNANCY. Microscopic Comment 3. UTERUS, CARCINOMA OR CARCINOSARCOMA Procedure: Total hysterectomy with bilateral salpingo-oophorectomy. Right obturator and left external iliac lymph node biopsies. Histologic type: Endometrioid adenocarcinoma with squamous differentiation. Histologic Grade: FIGO grade I. Myometrial invasion: Depth of invasion: 3 mm Myometrial thickness: 12 mm Uterine Serosa Involvement: Not identified. Cervical stromal involvement: Not identified. Extent of involvement of other organs: Uninvolved. Lymphovascular invasion: Not identified. Regional Lymph Nodes: Examined: 5 Sentinel 0 Non-sentinel 5 Total Lymph nodes with metastasis: 0 Isolated tumor cells (< 0.2 mm): 0 Micrometastasis: (> 0.2 mm and < 2.0 mm): 0 Macrometastasis: (> 2.0 mm): 0 Extracapsular extension: N/A. MMR / MSI testing: Will be ordered. Pathologic Stage Classification (pTNM, AJCC 8th edition): pT1a, pN0 FIGO Stage: IA Representative Tumor Block: 3D-G Comment: Pancytokeratin was performed on the lymph nodes and is negative   05/03/2018 Surgery   Operation: Robotic-assisted laparoscopic total hysterectomy with bilateral salpingoophorectomy, SLN biopsy   Surgeon: Donaciano Eva  Operative Findings:  : 6cm normal appearing uterus. Normal tubes and ovaries. Normal omentum and diaphragm. No suspicious nodes.     05/03/2018 Pathology Results   PERITONEAL WASHING(SPECIMEN 1 OF 1 COLLECTED 05/03/18): MALIGNANT CELLS CONSISTENT WITH METASTATIC ADENOCARCINOMA.   05/18/2018 Tumor Marker   Patient's tumor was tested for the following markers: CA-125 Results of the tumor marker test revealed 77.5   05/27/2018 Genetic Testing   MSH6 c.2731C>T pathogenic variant and GATA2 c.1232C>T VUS identified on the multicancer panel.   The Multi-Gene Panel offered by [facility] includes sequencing and/or deletion duplication testing of the following 85 genes: AIP, ALK, APC, ATM, AXIN2,BAP1,  BARD1, BLM, BMPR1A, BRCA1, BRCA2, BRIP1, CASR, CDC73, CDH1, CDK4, CDKN1B, CDKN1C, CDKN2A (p14ARF), CDKN2A (p16INK4a), CEBPA, CHEK2, CTNNA1, DICER1, DIS3L2, EGFR (c.2369C>T, p.Thr790Met variant only), EPCAM (Deletion/duplication testing only), FH, FLCN, GATA2, GPC3, GREM1 (Promoter region deletion/duplication testing only), HOXB13 (c.251G>A, p.Gly84Glu), HRAS, KIT, MAX, MEN1, MET, MITF (c.952G>A, p.Glu318Lys variant only), MLH1, MSH2, MSH3, MSH6, MUTYH, NBN, NF1, NF2, NTHL1, PALB2, PDGFRA, PHOX2B, PMS2, POLD1, POLE, POT1, PRKAR1A, PTCH1, PTEN, RAD50, RAD51C, RAD51D, RB1, RECQL4, RET, RNF43, RUNX1, SDHAF2, SDHA (sequence changes only), SDHB, SDHC, SDHD, SMAD4, SMARCA4, SMARCB1, SMARCE1, STK11, SUFU, TERC, TERT, TMEM127, TP53, TSC1, TSC2, VHL, WRN and WT1.  The report date is 05/27/2018.   06/09/2018 Cancer Staging   Staging form: Corpus Uteri - Carcinoma and Carcinosarcoma, AJCC 8th Edition - Pathologic: Stage I (pT1, pN0, cM0) - Signed by Heath Lark, MD on 06/09/2018   06/15/2018 Procedure   Placement of single lumen port a cath via right internal jugular vein. The catheter tip lies at the cavo-atrial junction. A power injectable port a cath was placed and is ready for immediate use.    06/20/2018 - 10/26/2018 Chemotherapy   The patient had carboplatin and Taxol x 2 cycles, Cycle 3 Taxol at 50%, then cycle 4-6 without Taxol, carboplatin only   06/25/2018 - 06/27/2018 Hospital Admission   She was admitted to the hospital with deydration   11/23/2018 Imaging   1. 8 mm hyperattenuating nodule along the midline vaginal cuff. Coronal imaging suggests that the hyperattenuation is related to calcification, but enhancement in  this nodule cannot be excluded. Patient is status post hysterectomy in the interval since prior CT. While this finding may be  related to prior surgery, close follow-up recommended. 2. Otherwise no evidence for metastatic disease in the abdomen or pelvis. 3. Large hiatal hernia with more than 50% of the stomach herniated into the lower chest, stable. 4.  Aortic Atherosclerois (ICD10-170.0)   12/01/2018 Procedure   Successful removal of implanted Port-A-Cath     Measurement of disease: Pending further workup . CA125 TBD  Radiology: Nm Pet Image Initial (pi) Skull Base To Thigh  Result Date: 02/24/2019 CLINICAL DATA:  Initial treatment strategy for stage IA grade 3 endometrial cancer status post [facility] 05/03/2018. EXAM: NUCLEAR MEDICINE PET SKULL BASE TO THIGH TECHNIQUE: 9.8 mCi F-18 FDG was injected intravenously. Full-ring PET imaging was performed from the skull base to thigh after the radiotracer. CT data was obtained and used for attenuation correction and anatomic localization. Fasting blood glucose: 129 mg/dl COMPARISON:  11/23/2018 CT abdomen/pelvis. FINDINGS: Mediastinal blood pool activity: SUV max 3.2 Liver activity: SUV max NA NECK: No hypermetabolic lymph nodes in the neck. Incidental CT findings: none CHEST: No enlarged or hypermetabolic axillary, mediastinal or hilar lymph nodes. No hypermetabolic pulmonary findings. Low level metabolism at the port removal site in the superficial upper ventral right chest wall (series 4/image 52), compatible with expected minimal inflammatory uptake. Incidental CT findings: Coronary atherosclerosis. Atherosclerotic nonaneurysmal thoracic aorta. No acute consolidative airspace disease, lung masses or significant pulmonary nodules. ABDOMEN/PELVIS: No abnormal hypermetabolic activity within the liver, pancreas, adrenal glands, or spleen. No hypermetabolic lymph nodes in the abdomen or pelvis. No evidence of hypermetabolic peritoneal implants. The hyperattenuating 8 mm nodule in the midline hysterectomy margin is stable and seen to be calcified on today's noncontrast CT images,  with no associated hypermetabolism, compatible with benign postsurgical change. Incidental CT findings: Diffuse hepatic steatosis. Large hiatal hernia. Atherosclerotic nonaneurysmal abdominal aorta. Moderate left colonic diverticulosis. Small fat containing umbilical hernia. SKELETON: No focal hypermetabolic activity to suggest skeletal metastasis. Incidental CT findings: none IMPRESSION: 1. No metabolic evidence of recurrent metastatic disease. 2. Stable benign postsurgical change at the hysterectomy margin. 3. Chronic findings include: Aortic Atherosclerosis (ICD10-I70.0). Coronary atherosclerosis. Diffuse hepatic steatosis. Large hiatal hernia. Moderate left colonic diverticulosis. Electronically Signed   By: Ilona Sorrel M.D.   On: 02/24/2019 12:07   .   Outpatient Encounter Medications as of 03/03/2019  Medication Sig  . acetaminophen (TYLENOL) 325 MG tablet Take 2 tablets (650 mg total) by mouth every 6 (six) hours as needed for mild pain, fever or headache (or Fever >/= 101).  Marland Kitchen aspirin EC 81 MG tablet Take 81 mg by mouth daily.  Marland Kitchen atenolol (TENORMIN) 50 MG tablet Take 50 mg by mouth daily.  Marland Kitchen atorvastatin (LIPITOR) 20 MG tablet Take 20 mg by mouth at bedtime.   . cholecalciferol (VITAMIN D) 400 units TABS tablet Take 400 Units by mouth daily.  . cyclobenzaprine (FLEXERIL) 5 MG tablet Take 1 tablet (5 mg total) by mouth 3 (three) times daily.  . metFORMIN (GLUCOPHAGE) 500 MG tablet TAKE 1 TABLET BY MOUTH TWICE A DAY WITH MEALS  . Omega-3 Fatty Acids (FISH OIL) 1200 MG CAPS Take 1,200 mg by mouth daily.   Marland Kitchen omeprazole (PRILOSEC) 40 MG capsule Take 1 capsule (40 mg total) by mouth daily.  Marland Kitchen Propylene Glycol (SYSTANE BALANCE) 0.6 % SOLN Place 1 drop into both eyes every morning.  . [DISCONTINUED] lisinopril (ZESTRIL) 5 MG tablet Take 5 mg  by mouth daily.  . [DISCONTINUED] nystatin (MYCOSTATIN/NYSTOP) powder Apply topically 4 (four) times daily.  . [DISCONTINUED] oxyCODONE-acetaminophen  (PERCOCET/ROXICET) 5-325 MG tablet Take 2 tablets by mouth every 6 (six) hours as needed for moderate pain or severe pain.  . [DISCONTINUED] promethazine (PHENERGAN) 25 MG tablet Take 1 tablet (25 mg total) by mouth every 6 (six) hours as needed for nausea or vomiting.   No facility-administered encounter medications on file as of 03/03/2019.    Allergies  Allergen Reactions  . Amlodipine     Blurred vision   . Compazine [Prochlorperazine Edisylate]     Stroke symptoms,  . Gabapentin     Blurred vision and nystagmus  . Lyrica [Pregabalin]     Blurred Vision   . Sertraline Other (See Comments)    Visual disturbances.   . Chlorhexidine Rash    Past Medical History:  Diagnosis Date  . Anxiety   . Arthritis   . Cataract    bilatereral cat. ext. with lens implant  . Depression   . Diabetes mellitus without complication (HCC)    diet controlled. States always has borderline HgbA1C  . Endometrial cancer ([place])   . Family history of colon cancer   . Family history of genetic mutation for hereditary nonpolyposis colorectal cancer (HNPCC)   . GERD (gastroesophageal reflux disease)   . High cholesterol   . History of kidney stones    years   . Hypertension   . Neuropathy   . Sensation of pressure in bladder area   . Uterine cramping    Past Surgical History:  Procedure Laterality Date  . burnt the nerves  Right 04/29/2018   in right knee   . burnt the nerves  Left 04/22/2018   left knee nerve   . cystoscopy kidney stone   1985  . IR IMAGING GUIDED PORT INSERTION  06/15/2018  . IR REMOVAL TUN ACCESS W/ PORT W/O FL MOD SED  12/01/2018  . REPLACEMENT TOTAL KNEE BILATERAL Bilateral 2018   Right x 2. 2019 undergoing intermittent injection "RFA" procedures with Dr. Sheliah [facility]  . REVISION TOTAL KNEE ARTHROPLASTY Right 03/2017  . ROBOTIC ASSISTED TOTAL HYSTERECTOMY WITH BILATERAL SALPINGO OOPHERECTOMY N/A 05/03/2018   Procedure: XI ROBOTIC ASSISTED TOTAL HYSTERECTOMY WITH  BILATERAL SALPINGO OOPHORECTOMY;  Surgeon: Everitt Amber, MD;  Location: [facility];  Service: Gynecology;  Laterality: N/A;  . SENTINEL NODE BIOPSY N/A 05/03/2018   Procedure: SENTINEL NODE BIOPSY;  Surgeon: Everitt Amber, MD;  Location: [facility];  Service: Gynecology;  Laterality: N/A;  . TONSILLECTOMY AND ADENOIDECTOMY    . TUBAL LIGATION          Past Gynecological History:   GYNECOLOGIC HISTORY:  . No LMP recorded. Patient has had a hysterectomy. 53 . Menarche: 68 years old . P 2 . Contraceptive desogen OCP and diaphragm history . HRT none  . Last Pap March 2019 "states normal" Family Hx:  Family History  Adopted: Yes  Problem Relation Age of Onset  . Colon cancer Daughter 58       Lynch syndrome - MSH6 +  . Healthy Son    Social Hx:  Marland Kitchen Tobacco use: current every day . Alcohol use: 2 glasses wine daily, negative CAGE . Illicit Drug use: none . Illicit IV Drug use: none    Review of Systems: Review of Systems  Constitutional: Negative.   HENT:  Negative.   Eyes: Negative.   Respiratory: Negative.   Cardiovascular: Negative.   Gastrointestinal: Negative.  Endocrine: Negative.   Genitourinary: Negative.    Musculoskeletal: Positive for arthralgias.  Neurological: Negative.   Hematological: Negative.   Psychiatric/Behavioral: The patient is nervous/anxious.   All other systems reviewed and are negative. +weight gain  Vitals:  Vitals:   03/03/19 1329  BP: (!) 158/86  Pulse: 70  Resp: 20  Temp: 98.3 F (36.8 C)  SpO2: 97%   Vitals:   03/03/19 1329  Weight: 198 lb 9.6 oz (90.1 kg)  Height: 5' 4.5" (1.638 m)   Body mass index is 33.56 kg/m.  Physical Exam: General :  Overweight.Well developed, 68 y.o., female in no apparent distress HEENT:  Normocephalic/atraumatic, symmetric, EOMI, eyelids normal Neck:   Supple, no masses.  Lymphatics:  No cervical/ submandibular/ supraclavicular/ infraclavicular/ inguinal adenopathy Respiratory:  Respirations unlabored, no  use of accessory muscles CV:   Deferred Breast:  Deferred Musculoskeletal: No CVA tenderness, normal muscle strength. Abdomen:  Soft, non-tender and nondistended. No evidence of hernia. No masses. Extremities:  No lymphedema, no erythema, non-tender. Skin:   Normal inspection Neuro/Psych:  No focal motor deficit, no abnormal mental status. Normal gait. Normal affect. Alert and oriented to person, place, and time. Well healed incisions.  Genito Urinary: Vaginal cuff well healed, no lesions or masses. Smooth, no lesions Rectovaginal:  Soft rectovaginal tissues, no palpable nodules.   Thereasa Solo, MD  Cc: Jordan Martinez (Referring Ob/Gyn) Jordan Glazier, MD (PCP)

## 2019-03-29 ENCOUNTER — Telehealth: Payer: Self-pay | Admitting: Oncology

## 2019-03-29 NOTE — Telephone Encounter (Signed)
Yes she can have both. The Shingrix is 1 month apart

## 2019-03-29 NOTE — Telephone Encounter (Signed)
Called Jordan Martinez and advised her of message from Dr. Alvy Bimler.  She verbalized understanding and agreement.

## 2019-03-29 NOTE — Telephone Encounter (Signed)
Jordan Martinez called and said she needs to have the high dose flu shot and the 2 does of the Shingrix vaccine.  She is wondering how long she needs to wait in between each vaccination.  [facility] told her that she can have both the same day but she wants to double check with Dr. Alvy Bimler.

## 2019-04-04 ENCOUNTER — Other Ambulatory Visit: Payer: Self-pay | Admitting: Hematology and Oncology

## 2019-05-05 ENCOUNTER — Other Ambulatory Visit: Payer: Self-pay | Admitting: Oncology

## 2019-06-01 IMAGING — XA IR FLUORO GUIDE CV LINE*L*
1 series · 1 of 1 positions shown · non-contrast
Comparison: none

CLINICAL DATA: Endometrial carcinoma and need for porta cath for
chemotherapy.

[Series 300: ir imaging guided port insertion · 1 of 1 slices shown]
[im 1/1]
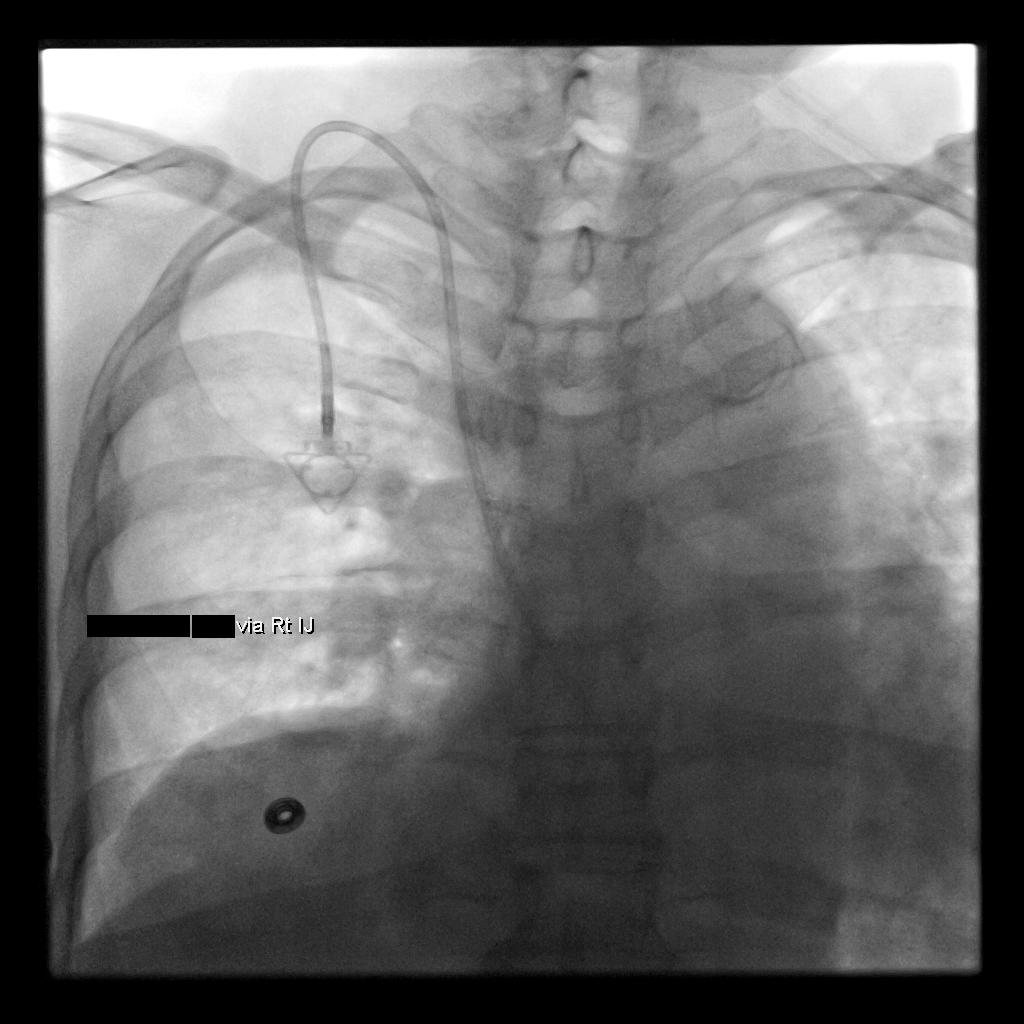

[1 of 1 positions shown; findings below may reference images not displayed]

EXAM:
IMPLANTED PORT A CATH PLACEMENT WITH ULTRASOUND AND FLUOROSCOPIC
GUIDANCE

ANESTHESIA/SEDATION:
4.0 mg IV Versed; 100 mcg IV Fentanyl

Total Moderate Sedation Time:  38 minutes

The patient's level of consciousness and physiologic status were
continuously monitored during the procedure by Radiology nursing.

Additional Medications: 2 g IV Ancef.

FLUOROSCOPY TIME:  1 minute and 24 seconds.  15.1 mGy.

PROCEDURE:
The procedure, risks, benefits, and alternatives were explained to
the patient. Questions regarding the procedure were encouraged and
answered. The patient understands and consents to the procedure. A
time-out was performed prior to initiating the procedure.

Ultrasound was utilized to confirm patency of the right internal
jugular vein. The right neck and chest were prepped with
chlorhexidine in a sterile fashion, and a sterile drape was applied
covering the operative field. Maximum barrier sterile technique with
sterile gowns and gloves were used for the procedure. Local
anesthesia was provided with 1% lidocaine.

After creating a small venotomy incision, a 21 gauge needle was
advanced into the right internal jugular vein under direct,
real-time ultrasound guidance. Ultrasound image documentation was
performed. After securing guidewire access, an 8 Fr dilator was
placed. A J-wire was kinked to measure appropriate catheter length.

A subcutaneous port pocket was then created along the upper chest
wall utilizing sharp and blunt dissection. Portable cautery was
utilized. The pocket was irrigated with sterile saline.

A single lumen power injectable port was chosen for placement. The 8
Fr catheter was tunneled from the port pocket site to the venotomy
incision. The port was placed in the pocket. External catheter was
trimmed to appropriate length based on guidewire measurement.

At the venotomy, an 8 Fr peel-away sheath was placed over a
guidewire. The catheter was then placed through the sheath and the
sheath removed. Final catheter positioning was confirmed and
documented with a fluoroscopic spot image. The port was accessed
with a needle and aspirated and flushed with heparinized saline. The
access needle was removed.

The venotomy and port pocket incisions were closed with subcutaneous
3-0 Monocryl and subcuticular 4-0 Vicryl. Dermabond was applied to
both incisions.

COMPLICATIONS:
COMPLICATIONS
None
FINDINGS: After catheter placement, the tip lies at the Ratts junction.
The catheter aspirates normally and is ready for immediate use.
IMPRESSION: Placement of single lumen port a cath via right internal jugular
vein. The catheter tip lies at the Ratts junction. A power
injectable port a cath was placed and is ready for immediate use.

## 2019-06-05 ENCOUNTER — Inpatient Hospital Stay: Payer: Medicare Other | Admitting: Gynecologic Oncology

## 2019-06-09 ENCOUNTER — Other Ambulatory Visit: Payer: Self-pay | Admitting: Oncology

## 2019-06-09 NOTE — Telephone Encounter (Signed)
Refill needs to go to Dr Alvy Bimler not Dr Jana Hakim

## 2019-06-15 ENCOUNTER — Inpatient Hospital Stay: Payer: Medicare Other | Attending: Gynecologic Oncology | Admitting: Gynecologic Oncology

## 2019-06-15 ENCOUNTER — Other Ambulatory Visit: Payer: Self-pay

## 2019-06-15 ENCOUNTER — Encounter: Payer: Self-pay | Admitting: Gynecologic Oncology

## 2019-06-15 VITALS — BP 129/73 | HR 71 | Temp 97.8°F | Resp 17 | Ht 64.5 in | Wt 196.6 lb

## 2019-06-15 DIAGNOSIS — Z1509 Genetic susceptibility to other malignant neoplasm: Secondary | ICD-10-CM | POA: Diagnosis not present

## 2019-06-15 DIAGNOSIS — E7800 Pure hypercholesterolemia, unspecified: Secondary | ICD-10-CM | POA: Diagnosis not present

## 2019-06-15 DIAGNOSIS — M1990 Unspecified osteoarthritis, unspecified site: Secondary | ICD-10-CM | POA: Insufficient documentation

## 2019-06-15 DIAGNOSIS — C786 Secondary malignant neoplasm of retroperitoneum and peritoneum: Secondary | ICD-10-CM | POA: Diagnosis not present

## 2019-06-15 DIAGNOSIS — Z79899 Other long term (current) drug therapy: Secondary | ICD-10-CM | POA: Diagnosis not present

## 2019-06-15 DIAGNOSIS — Z90722 Acquired absence of ovaries, bilateral: Secondary | ICD-10-CM

## 2019-06-15 DIAGNOSIS — F419 Anxiety disorder, unspecified: Secondary | ICD-10-CM | POA: Insufficient documentation

## 2019-06-15 DIAGNOSIS — Z90710 Acquired absence of both cervix and uterus: Secondary | ICD-10-CM

## 2019-06-15 DIAGNOSIS — Z7984 Long term (current) use of oral hypoglycemic drugs: Secondary | ICD-10-CM | POA: Insufficient documentation

## 2019-06-15 DIAGNOSIS — K219 Gastro-esophageal reflux disease without esophagitis: Secondary | ICD-10-CM | POA: Diagnosis not present

## 2019-06-15 DIAGNOSIS — Z9221 Personal history of antineoplastic chemotherapy: Secondary | ICD-10-CM

## 2019-06-15 DIAGNOSIS — I10 Essential (primary) hypertension: Secondary | ICD-10-CM | POA: Insufficient documentation

## 2019-06-15 DIAGNOSIS — E1136 Type 2 diabetes mellitus with diabetic cataract: Secondary | ICD-10-CM | POA: Diagnosis not present

## 2019-06-15 DIAGNOSIS — Z7982 Long term (current) use of aspirin: Secondary | ICD-10-CM | POA: Insufficient documentation

## 2019-06-15 DIAGNOSIS — C541 Malignant neoplasm of endometrium: Secondary | ICD-10-CM | POA: Diagnosis present
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
## 2019-06-15 NOTE — Patient Instructions (Signed)
Please notify Dr Denman George at phone number [phone] if you notice vaginal bleeding, new pelvic or abdominal pains, bloating, feeling full easy, or a change in bladder or bowel function.   Please return to see Dr Denman George in 3 months for a check up.

## 2019-06-21 ENCOUNTER — Other Ambulatory Visit: Payer: Self-pay | Admitting: Hematology and Oncology

## 2019-07-11 ENCOUNTER — Other Ambulatory Visit: Payer: Self-pay | Admitting: Hematology and Oncology

## 2019-07-17 ENCOUNTER — Other Ambulatory Visit: Payer: Self-pay

## 2019-07-17 ENCOUNTER — Encounter (HOSPITAL_COMMUNITY): Payer: Self-pay | Admitting: Radiology

## 2019-07-17 ENCOUNTER — Inpatient Hospital Stay (HOSPITAL_COMMUNITY)
Admission: EM | Admit: 2019-07-17 | Discharge: 2019-07-19 | DRG: 023 | Disposition: A | Discharge status: Home / Self Care | Payer: Medicare Other | Attending: Neurology | Admitting: Neurology

## 2019-07-17 ENCOUNTER — Inpatient Hospital Stay (HOSPITAL_COMMUNITY): Payer: Medicare Other

## 2019-07-17 ENCOUNTER — Emergency Department (HOSPITAL_COMMUNITY): Payer: Medicare Other

## 2019-07-17 ENCOUNTER — Inpatient Hospital Stay (HOSPITAL_COMMUNITY): Payer: Medicare Other | Admitting: Anesthesiology

## 2019-07-17 ENCOUNTER — Encounter (HOSPITAL_COMMUNITY)
Admission: EM | Disposition: A | Discharge status: Home / Self Care | Payer: Self-pay | Source: Home / Self Care | Attending: Neurology

## 2019-07-17 DIAGNOSIS — I10 Essential (primary) hypertension: Secondary | ICD-10-CM | POA: Diagnosis present

## 2019-07-17 DIAGNOSIS — Z961 Presence of intraocular lens: Secondary | ICD-10-CM | POA: Diagnosis present

## 2019-07-17 DIAGNOSIS — F17210 Nicotine dependence, cigarettes, uncomplicated: Secondary | ICD-10-CM | POA: Diagnosis present

## 2019-07-17 DIAGNOSIS — Z9221 Personal history of antineoplastic chemotherapy: Secondary | ICD-10-CM

## 2019-07-17 DIAGNOSIS — I63412 Cerebral infarction due to embolism of left middle cerebral artery: Secondary | ICD-10-CM | POA: Diagnosis present

## 2019-07-17 DIAGNOSIS — E782 Mixed hyperlipidemia: Secondary | ICD-10-CM

## 2019-07-17 DIAGNOSIS — Z9842 Cataract extraction status, left eye: Secondary | ICD-10-CM

## 2019-07-17 DIAGNOSIS — Z96653 Presence of artificial knee joint, bilateral: Secondary | ICD-10-CM | POA: Diagnosis present

## 2019-07-17 DIAGNOSIS — Z7984 Long term (current) use of oral hypoglycemic drugs: Secondary | ICD-10-CM

## 2019-07-17 DIAGNOSIS — I639 Cerebral infarction, unspecified: Secondary | ICD-10-CM | POA: Diagnosis present

## 2019-07-17 DIAGNOSIS — Z9841 Cataract extraction status, right eye: Secondary | ICD-10-CM

## 2019-07-17 DIAGNOSIS — E1140 Type 2 diabetes mellitus with diabetic neuropathy, unspecified: Secondary | ICD-10-CM | POA: Diagnosis present

## 2019-07-17 DIAGNOSIS — Z79899 Other long term (current) drug therapy: Secondary | ICD-10-CM

## 2019-07-17 DIAGNOSIS — K219 Gastro-esophageal reflux disease without esophagitis: Secondary | ICD-10-CM | POA: Diagnosis present

## 2019-07-17 DIAGNOSIS — I609 Nontraumatic subarachnoid hemorrhage, unspecified: Secondary | ICD-10-CM

## 2019-07-17 DIAGNOSIS — T45615A Adverse effect of thrombolytic drugs, initial encounter: Secondary | ICD-10-CM | POA: Diagnosis not present

## 2019-07-17 DIAGNOSIS — L271 Localized skin eruption due to drugs and medicaments taken internally: Secondary | ICD-10-CM | POA: Diagnosis not present

## 2019-07-17 DIAGNOSIS — Z79891 Long term (current) use of opiate analgesic: Secondary | ICD-10-CM

## 2019-07-17 DIAGNOSIS — E785 Hyperlipidemia, unspecified: Secondary | ICD-10-CM | POA: Diagnosis present

## 2019-07-17 DIAGNOSIS — E669 Obesity, unspecified: Secondary | ICD-10-CM | POA: Diagnosis present

## 2019-07-17 DIAGNOSIS — Z9079 Acquired absence of other genital organ(s): Secondary | ICD-10-CM

## 2019-07-17 DIAGNOSIS — M25562 Pain in left knee: Secondary | ICD-10-CM | POA: Diagnosis present

## 2019-07-17 DIAGNOSIS — F17200 Nicotine dependence, unspecified, uncomplicated: Secondary | ICD-10-CM | POA: Diagnosis not present

## 2019-07-17 DIAGNOSIS — Z90710 Acquired absence of both cervix and uterus: Secondary | ICD-10-CM

## 2019-07-17 DIAGNOSIS — Z90722 Acquired absence of ovaries, bilateral: Secondary | ICD-10-CM | POA: Diagnosis not present

## 2019-07-17 DIAGNOSIS — Z888 Allergy status to other drugs, medicaments and biological substances status: Secondary | ICD-10-CM

## 2019-07-17 DIAGNOSIS — I608 Other nontraumatic subarachnoid hemorrhage: Secondary | ICD-10-CM | POA: Diagnosis present

## 2019-07-17 DIAGNOSIS — Z6834 Body mass index (BMI) 34.0-34.9, adult: Secondary | ICD-10-CM

## 2019-07-17 DIAGNOSIS — M25561 Pain in right knee: Secondary | ICD-10-CM | POA: Diagnosis present

## 2019-07-17 DIAGNOSIS — I6602 Occlusion and stenosis of left middle cerebral artery: Secondary | ICD-10-CM | POA: Diagnosis not present

## 2019-07-17 DIAGNOSIS — R000 Tachycardia, unspecified: Secondary | ICD-10-CM | POA: Diagnosis present

## 2019-07-17 DIAGNOSIS — Z7982 Long term (current) use of aspirin: Secondary | ICD-10-CM

## 2019-07-17 DIAGNOSIS — Y92230 Patient room in hospital as the place of occurrence of the external cause: Secondary | ICD-10-CM | POA: Diagnosis not present

## 2019-07-17 DIAGNOSIS — Z20822 Contact with and (suspected) exposure to covid-19: Secondary | ICD-10-CM | POA: Diagnosis present

## 2019-07-17 DIAGNOSIS — E7800 Pure hypercholesterolemia, unspecified: Secondary | ICD-10-CM | POA: Diagnosis not present

## 2019-07-17 DIAGNOSIS — I63512 Cerebral infarction due to unspecified occlusion or stenosis of left middle cerebral artery: Secondary | ICD-10-CM | POA: Diagnosis present

## 2019-07-17 DIAGNOSIS — Z8542 Personal history of malignant neoplasm of other parts of uterus: Secondary | ICD-10-CM

## 2019-07-17 DIAGNOSIS — Z800 Family history of malignant neoplasm of digestive organs: Secondary | ICD-10-CM

## 2019-07-17 DIAGNOSIS — C541 Malignant neoplasm of endometrium: Secondary | ICD-10-CM | POA: Diagnosis present

## 2019-07-17 DIAGNOSIS — R4701 Aphasia: Secondary | ICD-10-CM | POA: Diagnosis present

## 2019-07-17 DIAGNOSIS — R29703 NIHSS score 3: Secondary | ICD-10-CM | POA: Diagnosis present

## 2019-07-17 DIAGNOSIS — Z791 Long term (current) use of non-steroidal anti-inflammatories (NSAID): Secondary | ICD-10-CM

## 2019-07-17 DIAGNOSIS — I6389 Other cerebral infarction: Secondary | ICD-10-CM | POA: Diagnosis not present

## 2019-07-17 DIAGNOSIS — Z9089 Acquired absence of other organs: Secondary | ICD-10-CM

## 2019-07-17 HISTORY — DX: Cerebral infarction, unspecified: I63.9

## 2019-07-17 HISTORY — PX: RADIOLOGY WITH ANESTHESIA: SHX6223

## 2019-07-17 LAB — COMPREHENSIVE METABOLIC PANEL
ALT: 37 U/L (ref 0–44)
AST: 33 U/L (ref 15–41)
Albumin: 4.2 g/dL (ref 3.5–5.0)
Alkaline Phosphatase: 80 U/L (ref 38–126)
Anion gap: 12 (ref 5–15)
BUN: 21 mg/dL (ref 8–23)
CO2: 22 mmol/L (ref 22–32)
Calcium: 9.2 mg/dL (ref 8.9–10.3)
Chloride: 109 mmol/L (ref 98–111)
Creatinine, Ser: 0.78 mg/dL (ref 0.44–1.00)
GFR calc Af Amer: >60 mL/min (ref >60)
GFR calc non Af Amer: >60 mL/min (ref >60)
Glucose, Bld: 127 mg/dL — ABNORMAL HIGH (ref 70–99)
Potassium: 4.1 mmol/L (ref 3.5–5.1)
Sodium: 143 mmol/L (ref 135–145)
Total Bilirubin: 1.2 mg/dL (ref 0.3–1.2)
Total Protein: 6.9 g/dL (ref 6.5–8.1)

## 2019-07-17 LAB — I-STAT CHEM 8, ED
BUN: 22 mg/dL (ref 8–23)
Calcium, Ion: 1.15 mmol/L (ref 1.15–1.40)
Chloride: 108 mmol/L (ref 98–111)
Creatinine, Ser: 0.6 mg/dL (ref 0.44–1.00)
Glucose, Bld: 123 mg/dL — ABNORMAL HIGH (ref 70–99)
HCT: 47 % — ABNORMAL HIGH (ref 36.0–46.0)
Hemoglobin: 16 g/dL — ABNORMAL HIGH (ref 12.0–15.0)
Potassium: 3.5 mmol/L (ref 3.5–5.1)
Sodium: 142 mmol/L (ref 135–145)
TCO2: 23 mmol/L (ref 22–32)

## 2019-07-17 LAB — PROTIME-INR
INR: 0.9 (ref 0.8–1.2)
Prothrombin Time: 11.7 seconds (ref 11.4–15.2)

## 2019-07-17 LAB — CBC
HCT: 46.8 % — ABNORMAL HIGH (ref 36.0–46.0)
Hemoglobin: 15.3 g/dL — ABNORMAL HIGH (ref 12.0–15.0)
MCH: 33.3 pg (ref 26.0–34.0)
MCHC: 32.7 g/dL (ref 30.0–36.0)
MCV: 102 fL — ABNORMAL HIGH (ref 80.0–100.0)
Platelets: 247 10*3/uL (ref 150–400)
RBC: 4.59 MIL/uL (ref 3.87–5.11)
RDW: 13.5 % (ref 11.5–15.5)
WBC: 10.4 10*3/uL (ref 4.0–10.5)
nRBC: 0 % (ref 0.0–0.2)

## 2019-07-17 LAB — ETHANOL: Alcohol, Ethyl (B): <10 mg/dL (ref <10)

## 2019-07-17 LAB — RESPIRATORY PANEL BY RT PCR (FLU A&B, COVID)
Influenza A by PCR: NEGATIVE
Influenza B by PCR: NEGATIVE
SARS Coronavirus 2 by RT PCR: NEGATIVE

## 2019-07-17 LAB — DIFFERENTIAL
Abs Immature Granulocytes: 0.03 10*3/uL (ref 0.00–0.07)
Basophils Absolute: 0.1 10*3/uL (ref 0.0–0.1)
Basophils Relative: 1 %
Eosinophils Absolute: 0.2 10*3/uL (ref 0.0–0.5)
Eosinophils Relative: 2 %
Immature Granulocytes: 0 %
Lymphocytes Relative: 39 %
Lymphs Abs: 4 10*3/uL (ref 0.7–4.0)
Monocytes Absolute: 1 10*3/uL (ref 0.1–1.0)
Monocytes Relative: 9 %
Neutro Abs: 5.1 10*3/uL (ref 1.7–7.7)
Neutrophils Relative %: 49 %

## 2019-07-17 LAB — CBG MONITORING, ED: Glucose-Capillary: 114 mg/dL — ABNORMAL HIGH (ref 70–99)

## 2019-07-17 LAB — APTT: aPTT: 23 seconds — ABNORMAL LOW (ref 24–36)

## 2019-07-17 SURGERY — IR WITH ANESTHESIA
Anesthesia: General

## 2019-07-17 MED ORDER — ASPIRIN 81 MG PO CHEW
CHEWABLE_TABLET | ORAL | Status: AC
  Filled 2019-07-17: qty 1

## 2019-07-17 MED ORDER — DIPHENHYDRAMINE HCL 50 MG/ML IJ SOLN: 25.0000 mg | Freq: Once | INTRAMUSCULAR | Status: DC

## 2019-07-17 MED ORDER — SODIUM CHLORIDE 0.9 % IV SOLN: INTRAVENOUS | Status: DC

## 2019-07-17 MED ORDER — PANTOPRAZOLE SODIUM 40 MG IV SOLR: 40.0000 mg | Freq: Every day | INTRAVENOUS | Status: DC

## 2019-07-17 MED ORDER — CLEVIDIPINE BUTYRATE 0.5 MG/ML IV EMUL: 0.0000 mg/h | INTRAVENOUS | Status: DC

## 2019-07-17 MED ORDER — LABETALOL HCL 5 MG/ML IV SOLN: 20.0000 mg | Freq: Once | INTRAVENOUS | Status: DC

## 2019-07-17 MED ORDER — STROKE: EARLY STAGES OF RECOVERY BOOK: Freq: Once | Status: AC

## 2019-07-17 MED ORDER — CLOPIDOGREL BISULFATE 300 MG PO TABS
ORAL_TABLET | ORAL | Status: AC
  Filled 2019-07-17: qty 1

## 2019-07-17 MED ORDER — CEFAZOLIN SODIUM-DEXTROSE 2-4 GM/100ML-% IV SOLN
INTRAVENOUS | Status: AC
  Filled 2019-07-17: qty 100

## 2019-07-17 MED ORDER — IOHEXOL 350 MG/ML SOLN
100.0000 mL | Freq: Once | INTRAVENOUS | Status: AC | PRN
  Administered 2019-07-17: 100 mL via INTRAVENOUS

## 2019-07-17 MED ORDER — SENNOSIDES-DOCUSATE SODIUM 8.6-50 MG PO TABS: 1.0000 | ORAL_TABLET | Freq: Every evening | ORAL | Status: DC | PRN

## 2019-07-17 MED ORDER — SODIUM CHLORIDE 0.9 % IV SOLN
50.0000 mL | Freq: Once | INTRAVENOUS | Status: DC
  Administered 2019-07-17: 50 mL via INTRAVENOUS

## 2019-07-17 MED ORDER — FENTANYL CITRATE (PF) 100 MCG/2ML IJ SOLN
INTRAMUSCULAR | Status: AC
  Filled 2019-07-17: qty 2

## 2019-07-17 MED ORDER — TIROFIBAN HCL IN NACL 5-0.9 MG/100ML-% IV SOLN
INTRAVENOUS | Status: AC
  Filled 2019-07-17: qty 100

## 2019-07-17 MED ORDER — EPTIFIBATIDE 20 MG/10ML IV SOLN
INTRAVENOUS | Status: AC
  Filled 2019-07-17: qty 10

## 2019-07-17 MED ORDER — NITROGLYCERIN 1 MG/10 ML FOR IR/CATH LAB
INTRA_ARTERIAL | Status: AC
  Filled 2019-07-17: qty 10

## 2019-07-17 MED ORDER — TICAGRELOR 90 MG PO TABS
ORAL_TABLET | ORAL | Status: AC
  Filled 2019-07-17: qty 2

## 2019-07-17 MED ORDER — FENTANYL CITRATE (PF) 100 MCG/2ML IJ SOLN
INTRAMUSCULAR | Status: DC | PRN
  Administered 2019-07-17: 50 ug via INTRAVENOUS

## 2019-07-17 MED ORDER — ACETAMINOPHEN 325 MG PO TABS: 650.0000 mg | ORAL_TABLET | ORAL | Status: DC | PRN

## 2019-07-17 MED ORDER — ACETAMINOPHEN 650 MG RE SUPP: 650.0000 mg | RECTAL | Status: DC | PRN

## 2019-07-17 MED ORDER — ALTEPLASE (STROKE) FULL DOSE INFUSION
0.9000 mg/kg | Freq: Once | INTRAVENOUS | Status: AC
  Administered 2019-07-17: 84.1 mg via INTRAVENOUS
  Filled 2019-07-17: qty 100

## 2019-07-17 MED ORDER — ACETAMINOPHEN 160 MG/5ML PO SOLN: 650.0000 mg | ORAL | Status: DC | PRN

## 2019-07-17 MED ORDER — DIPHENHYDRAMINE HCL 50 MG/ML IJ SOLN
INTRAMUSCULAR | Status: AC
  Filled 2019-07-17: qty 1

## 2019-07-17 NOTE — ED Notes (Signed)
Pt has allergic reaction (blisters/welps) at site of TPA; neuro notified and advised to give Benadryl IV and to continue TPA.

## 2019-07-17 NOTE — Progress Notes (Signed)
PHARMACIST CODE STROKE RESPONSE  Notified to mix tPA at 2202 by Dr. Rory Percy Delivered tPA to RN at 2206  tPA dose = 8.4mg  bolus over 1 minute followed by 75.7mg  for a total dose of 84.1mg  over 1 hour  Issues/delays encountered (if applicable):   Jordan Martinez 07/17/19 10:08 PM

## 2019-07-17 NOTE — ED Triage Notes (Signed)
Pt was eating supper with family, c/o brief HA and then stopped answering questions.  Upon EMS arrival, pt was unable to follow commands, but was aphasic. Shortly thereafter, pt has had intermittent aphasia.

## 2019-07-17 NOTE — ED Provider Notes (Signed)
[facility] EMERGENCY DEPARTMENT Provider Note   CSN: [id number] Arrival date & time: 07/17/19  2145     History Chief Complaint  Patient presents with  . Code Stroke    Jordan Martinez is a 69 y.o. female.  HPI   Patient presented to the emergency room for evaluation of acute onset of aphasia.  Patient was home with her family.  She was eating dinner when she started to complain of a headache and then immediately stopped answering any questions.  This all occurred shortly before arrival.  EMS was emergently called.  They indicated the patient was aphasic but would follow commands.  No other focal neurologic deficits noted.  Code stroke was activated in the field.  Patient was seen by Dr. Rory Percy on arrival.  Past Medical History:  Diagnosis Date  . Anxiety   . Arthritis   . Cataract    bilatereral cat. ext. with lens implant  . Depression   . Diabetes mellitus without complication (HCC)    diet controlled. States always has borderline HgbA1C  . Endometrial cancer ([place])   . Family history of colon cancer   . Family history of genetic mutation for hereditary nonpolyposis colorectal cancer (HNPCC)   . GERD (gastroesophageal reflux disease)   . High cholesterol   . History of kidney stones    years   . Hypertension   . Neuropathy   . Sensation of pressure in bladder area   . Uterine cramping     Patient Active Problem List   Diagnosis Date Noted  . Acute ischemic stroke ([place]) 07/17/2019  . Yeast infection 11/15/2018  . Preventive measure 10/28/2018  . Gout attack 08/08/2018  . Dysuria 08/01/2018  . Elevated liver enzymes 08/01/2018  . Chemotherapy-induced nausea 07/14/2018  . Other constipation 07/14/2018  . Physical debility 07/08/2018  . Port-A-Cath in place 07/01/2018  . Essential hypertension 07/01/2018  . 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 Arthritis 07/01/2018  . Leukopenia due to antineoplastic chemotherapy ([place]) 06/26/2018  .  Diarrhea 06/26/2018  . ARF (acute renal failure) ([place]) 06/25/2018  . Diabetes mellitus without complication ([place]) 32/44/0102  . Genetic testing 05/30/2018  . Family history of colon cancer   . Family history of genetic mutation for hereditary nonpolyposis colorectal cancer (HNPCC)   . Endometrial cancer ([place]) 05/03/2018    Past Surgical History:  Procedure Laterality Date  . burnt the nerves  Right 04/29/2018   in right knee   . burnt the nerves  Left 04/22/2018   left knee nerve   . cystoscopy kidney stone   1985  . IR IMAGING GUIDED PORT INSERTION  06/15/2018  . IR REMOVAL TUN ACCESS W/ PORT W/O FL MOD SED  12/01/2018  . REPLACEMENT TOTAL KNEE BILATERAL Bilateral 2018   Right x 2. 2019 undergoing intermittent injection "RFA" procedures with Dr. Sheliah Mends [facility]  . REVISION TOTAL KNEE ARTHROPLASTY Right 03/2017  . ROBOTIC ASSISTED TOTAL HYSTERECTOMY WITH BILATERAL SALPINGO OOPHERECTOMY N/A 05/03/2018   Procedure: XI ROBOTIC ASSISTED TOTAL HYSTERECTOMY WITH BILATERAL SALPINGO OOPHORECTOMY;  Surgeon: Everitt Amber, MD;  Location: [facility] ORS;  Service: Gynecology;  Laterality: N/A;  . SENTINEL NODE BIOPSY N/A 05/03/2018   Procedure: SENTINEL NODE BIOPSY;  Surgeon: Everitt Amber, MD;  Location: [facility] ORS;  Service: Gynecology;  Laterality: N/A;  . TONSILLECTOMY AND ADENOIDECTOMY    . TUBAL LIGATION       OB History    Gravida  2   Para  2  Term  2   Preterm      AB      Living  2     SAB      TAB      Ectopic      Multiple      Live Births  2           Family History  Adopted: Yes  Problem Relation Age of Onset  . Colon cancer Daughter 84       Lynch syndrome - MSH6 +  . Healthy Son     Social History   Tobacco Use  . Smoking status: Current Every Day Smoker    Packs/day: 0.50    Years: 40.00    Pack years: 20.00    Types: Cigarettes  . Smokeless tobacco: Never Used  . Tobacco comment: 4-5 loose cigarettes per day , over 20 year hx of smoking     Substance Use Topics  . Alcohol use: Yes    Comment: 2 glasses wine daily.   . Drug use: Never    Home Medications Prior to Admission medications   Medication Sig Start Date End Date Taking? Authorizing Provider  acetaminophen (TYLENOL) 325 MG tablet Take 2 tablets (650 mg total) by mouth every 6 (six) hours as needed for mild pain, fever or headache (or Fever >/= 101). 06/27/18   Roxan Hockey, MD  aspirin EC 81 MG tablet Take 81 mg by mouth daily.    [provider]  atenolol (TENORMIN) 50 MG tablet Take 50 mg by mouth daily.    [provider]  atorvastatin (LIPITOR) 20 MG tablet Take 20 mg by mouth at bedtime.  01/17/18   [provider]  celecoxib (CELEBREX) 100 MG capsule Take 200 mg by mouth 2 (two) times daily.     [provider]  cholecalciferol (VITAMIN D) 400 units TABS tablet Take 400 Units by mouth daily.    [provider]  clindamycin (CLEOCIN) 150 MG capsule Take 150 mg by mouth 3 (three) times daily.    [provider]  cyclobenzaprine (FLEXERIL) 5 MG tablet Take 1 tablet (5 mg total) by mouth 3 (three) times daily. 09/09/18   Heath Lark, MD  lisinopril (ZESTRIL) 10 MG tablet Take 10 mg by mouth daily.    [provider]  metFORMIN (GLUCOPHAGE) 500 MG tablet TAKE 1 TABLET BY MOUTH TWICE A DAY WITH MEALS 05/05/19   Magrinat, Virgie Dad, MD  Omega-3 Fatty Acids (FISH OIL) 1200 MG CAPS Take 1,200 mg by mouth daily.     [provider]  omeprazole (PRILOSEC) 40 MG capsule Take 1 capsule (40 mg total) by mouth daily. 12/09/18   Thornton Park, MD  Propylene Glycol (SYSTANE BALANCE) 0.6 % SOLN Place 1 drop into both eyes every morning.    [provider]    Allergies    Amlodipine, Compazine [prochlorperazine edisylate], Gabapentin, Lyrica [pregabalin], Sertraline, and Chlorhexidine  Review of Systems   Review of Systems  All other systems reviewed and are negative.   Physical Exam Updated  Vital Signs BP (!) 149/84   Pulse 97   Resp 18   Wt 93.4 kg   SpO2 100%   BMI 34.80 kg/m   Physical Exam Vitals and nursing note reviewed.  Constitutional:      Appearance: She is well-developed. She is not diaphoretic.  HENT:     Head: Normocephalic and atraumatic.     Right Ear: External ear normal.  Left Ear: External ear normal.  Eyes:     General: No scleral icterus.       Right eye: No discharge.        Left eye: No discharge.     Conjunctiva/sclera: Conjunctivae normal.  Neck:     Trachea: No tracheal deviation.  Cardiovascular:     Rate and Rhythm: Normal rate.  Pulmonary:     Effort: Pulmonary effort is normal. No respiratory distress.     Breath sounds: No stridor.  Abdominal:     General: There is no distension.  Musculoskeletal:        General: No swelling or deformity.     Cervical back: Neck supple.  Skin:    General: Skin is warm and dry.     Findings: No rash.  Neurological:     Mental Status: She is alert.     Cranial Nerves: Cranial nerve deficit (no facial droop , aphasia) present.     Sensory: No sensory deficit.     Motor: No weakness.  Psychiatric:     Comments: anxious     ED Results / Procedures / Treatments   Labs (all labs ordered are listed, but only abnormal results are displayed) Labs Reviewed  CBC - Abnormal; Notable for the following components:      Result Value   Hemoglobin 15.3 (*)    HCT 46.8 (*)    MCV 102.0 (*)    All other components within normal limits  COMPREHENSIVE METABOLIC PANEL - Abnormal; Notable for the following components:   Glucose, Bld 127 (*)    All other components within normal limits  I-STAT CHEM 8, ED - Abnormal; Notable for the following components:   Glucose, Bld 123 (*)    Hemoglobin 16.0 (*)    HCT 47.0 (*)    All other components within normal limits  CBG MONITORING, ED - Abnormal; Notable for the following components:   Glucose-Capillary 114 (*)    All other components within normal  limits  RESPIRATORY PANEL BY RT PCR (FLU A&B, COVID)  ETHANOL  DIFFERENTIAL  PROTIME-INR  APTT  RAPID URINE DRUG SCREEN, HOSP PERFORMED  URINALYSIS, ROUTINE W REFLEX MICROSCOPIC  HIV ANTIBODY (ROUTINE TESTING W REFLEX)  HEMOGLOBIN A1C  LIPID PANEL    EKG None  Radiology CT Code Stroke CTA Head W/WO contrast  Result Date: 07/17/2019 CLINICAL DATA:  69 year old female code stroke with aphasia and asymmetric left MCA hyperdensity on plain head CT today. EXAM: CT ANGIOGRAPHY HEAD AND NECK CT PERFUSION BRAIN TECHNIQUE: Multidetector CT imaging of the head and neck was performed using the standard protocol during bolus administration of intravenous contrast. Multiplanar CT image reconstructions and MIPs were obtained to evaluate the vascular anatomy. Carotid stenosis measurements (when applicable) are obtained utilizing NASCET criteria, using the distal internal carotid diameter as the denominator. Multiphase CT imaging of the brain was performed following IV bolus contrast injection. Subsequent parametric perfusion maps were calculated using RAPID software. CONTRAST:  147m OMNIPAQUE IOHEXOL 350 MG/ML SOLN COMPARISON:  Plain head CT 2157 hours today. FINDINGS: CT Brain Perfusion Findings: ASPECTS: 10 CBF (<30%) Volume: 764mPerfusion (Tmax>6.0s) volume: 5380mypoperfusion index of 0.4. Mismatch Volume: 52m15mfarction Location:Left MCA, operculum white matter. Skeleton: Cervical spine degeneration. No acute osseous abnormality identified. Upper chest: Negative. Other neck: Partially retropharyngeal course of both carotids and carotid bifurcations. No acute findings in the neck. Aortic arch: 3 vessel arch configuration with mild to moderate calcified arch atherosclerosis. Tortuous great vessel origins. Right  carotid system: Tortuous brachiocephalic artery and proximal right CCA without stenosis. Retropharyngeal right carotid bifurcation with mild cervical right ICA plaque and no stenosis. Left carotid  system: Tortuous proximal left CCA without stenosis. Retropharyngeal left carotid bifurcation with bulky calcified plaque at the posterior left ICA origin and bulb resulting in 50 % stenosis with respect to the distal vessel. No additional plaque to the skull base. Vertebral arteries: No proximal right subclavian artery or right vertebral artery origin plaque or stenosis. The right vertebral appears dominant and is patent to the skull base without stenosis. Mild plaque in the proximal left subclavian artery without stenosis. Normal left vertebral artery origin. Tortuous left V1 segment. Minimally non dominant left vertebral artery is patent to the skull base without stenosis. CTA HEAD Posterior circulation: Mild bilateral calcified V4 segment plaque without stenosis. Patent vertebrobasilar junction and basilar artery without stenosis. Patent PICA origins. Ectatic basilar tip. Normal SCA and PCA origins otherwise. Posterior communicating arteries are diminutive or absent. Bilateral PCA branches are within normal limits. Anterior circulation: Both ICA siphons are patent. Mild to moderate calcified plaque on the left without significant siphon stenosis. Minimal to mild calcified plaque on the right without siphon stenosis. Normal ophthalmic artery origins. Patent carotid termini. Normal MCA and ACA origins. Anterior communicating artery and bilateral ACA branches are within normal limits. Right MCA M1 segment and trifurcation are patent without stenosis. Right MCA branches are within normal limits. Left MCA M1 segment is patent, and a left MCA trifurcation occurs somewhat early and is patent without stenosis. A dominant posterior left M2 branch is occluded 11-12 millimeters distal to the trifurcation as seen on series 10, image 137 and series 11, image 24. The other left M2 branches seem to remain patent. Venous sinuses: Early contrast timing. The superior sagittal sinus, torcula and the dominant right transverse  sinuses appear patent. Anatomic variants: Retropharyngeal course of both cervical carotids. Mildly dominant right vertebral. Review of the MIP images confirms the above findings IMPRESSION: 1. 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 This finding was communicated to Dr. Rory Percy at 1027 hours via the [facility] messaging system. 2. CT Perfusion detects 7 mL of infarct core and 53 mL of ischemic penumbra in the corresponding Left MCA territory/operculum. 3. Superimposed 50% stenosis at the Left ICA origin due to calcified plaque. Mild left siphon plaque without stenosis. 4. Mild for age atherosclerosis elsewhere in the head and neck. Tortuous proximal great vessels and incidental retropharyngeal course of both cervical carotids. Electronically Signed   By: Genevie Ann M.D.   On: 07/17/2019 22:36   CT Code Stroke CTA Neck W/WO contrast  Result Date: 07/17/2019 CLINICAL DATA:  69 year old female code stroke with aphasia and asymmetric left MCA hyperdensity on plain head CT today. EXAM: CT ANGIOGRAPHY HEAD AND NECK CT PERFUSION BRAIN TECHNIQUE: Multidetector CT imaging of the head and neck was performed using the standard protocol during bolus administration of intravenous contrast. Multiplanar CT image reconstructions and MIPs were obtained to evaluate the vascular anatomy. Carotid stenosis measurements (when applicable) are obtained utilizing NASCET criteria, using the distal internal carotid diameter as the denominator. Multiphase CT imaging of the brain was performed following IV bolus contrast injection. Subsequent parametric perfusion maps were calculated using RAPID software. CONTRAST:  133m OMNIPAQUE IOHEXOL 350 MG/ML SOLN COMPARISON:  Plain head CT 2157 hours today. FINDINGS: CT Brain Perfusion Findings: ASPECTS: 10 CBF (<30%) Volume: 744mPerfusion (Tmax>6.0s)  volume: 60m hypoperfusion index of 0.4. Mismatch Volume: 456mInfarction  Location:Left MCA, operculum white matter. Skeleton: Cervical spine degeneration. No acute osseous abnormality identified. Upper chest: Negative. Other neck: Partially retropharyngeal course of both carotids and carotid bifurcations. No acute findings in the neck. Aortic arch: 3 vessel arch configuration with mild to moderate calcified arch atherosclerosis. Tortuous great vessel origins. Right carotid system: Tortuous brachiocephalic artery and proximal right CCA without stenosis. Retropharyngeal right carotid bifurcation with mild cervical right ICA plaque and no stenosis. Left carotid system: Tortuous proximal left CCA without stenosis. Retropharyngeal left carotid bifurcation with bulky calcified plaque at the posterior left ICA origin and bulb resulting in 50 % stenosis with respect to the distal vessel. No additional plaque to the skull base. Vertebral arteries: No proximal right subclavian artery or right vertebral artery origin plaque or stenosis. The right vertebral appears dominant and is patent to the skull base without stenosis. Mild plaque in the proximal left subclavian artery without stenosis. Normal left vertebral artery origin. Tortuous left V1 segment. Minimally non dominant left vertebral artery is patent to the skull base without stenosis. CTA HEAD Posterior circulation: Mild bilateral calcified V4 segment plaque without stenosis. Patent vertebrobasilar junction and basilar artery without stenosis. Patent PICA origins. Ectatic basilar tip. Normal SCA and PCA origins otherwise. Posterior communicating arteries are diminutive or absent. Bilateral PCA branches are within normal limits. Anterior circulation: Both ICA siphons are patent. Mild to moderate calcified plaque on the left without significant siphon stenosis. Minimal to mild calcified plaque on the right without siphon stenosis. Normal ophthalmic artery origins. Patent carotid termini. Normal MCA and ACA origins. Anterior communicating artery  and bilateral ACA branches are within normal limits. Right MCA M1 segment and trifurcation are patent without stenosis. Right MCA branches are within normal limits. Left MCA M1 segment is patent, and a left MCA trifurcation occurs somewhat early and is patent without stenosis. A dominant posterior left M2 branch is occluded 11-12 millimeters distal to the trifurcation as seen on series 10, image 137 and series 11, image 24. The other left M2 branches seem to remain patent. Venous sinuses: Early contrast timing. The superior sagittal sinus, torcula and the dominant right transverse sinuses appear patent. Anatomic variants: Retropharyngeal course of both cervical carotids. Mildly dominant right vertebral. Review of the MIP images confirms the above findings IMPRESSION: 1. 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 This finding was communicated to Dr. ArRory Percyt 1027 hours via the [facility] system. 2. CT Perfusion detects 7 mL of infarct core and 53 mL of ischemic penumbra in the corresponding Left MCA territory/operculum. 3. Superimposed 50% stenosis at the Left ICA origin due to calcified plaque. Mild left siphon plaque without stenosis. 4. Mild for age atherosclerosis elsewhere in the head and neck. Tortuous proximal great vessels and incidental retropharyngeal course of both cervical carotids. Electronically Signed   By: H Genevie Ann.D.   On: 07/17/2019 22:36   CT Code Stroke Cerebral Perfusion with contrast  Result Date: 07/17/2019 CLINICAL DATA:  [age]ear old female code stroke with aphasia and asymmetric left MCA hyperdensity on plain head CT today. EXAM: CT ANGIOGRAPHY HEAD AND NECK CT PERFUSION BRAIN TECHNIQUE: Multidetector CT imaging of the head and neck was performed using the standard protocol during bolus administration of intravenous contrast. Multiplanar CT image reconstructions and MIPs were obtained to evaluate  the vascular anatomy. Carotid stenosis measurements (when applicable) are obtained  utilizing NASCET criteria, using the distal internal carotid diameter as the denominator. Multiphase CT imaging of the brain was performed following IV bolus contrast injection. Subsequent parametric perfusion maps were calculated using RAPID software. CONTRAST:  12m OMNIPAQUE IOHEXOL 350 MG/ML SOLN COMPARISON:  Plain head CT 2157 hours today. FINDINGS: CT Brain Perfusion Findings: ASPECTS: 10 CBF (<30%) Volume: 728mPerfusion (Tmax>6.0s) volume: 532mypoperfusion index of 0.4. Mismatch Volume: 75m49mfarction Location:Left MCA, operculum white matter. Skeleton: Cervical spine degeneration. No acute osseous abnormality identified. Upper chest: Negative. Other neck: Partially retropharyngeal course of both carotids and carotid bifurcations. No acute findings in the neck. Aortic arch: 3 vessel arch configuration with mild to moderate calcified arch atherosclerosis. Tortuous great vessel origins. Right carotid system: Tortuous brachiocephalic artery and proximal right CCA without stenosis. Retropharyngeal right carotid bifurcation with mild cervical right ICA plaque and no stenosis. Left carotid system: Tortuous proximal left CCA without stenosis. Retropharyngeal left carotid bifurcation with bulky calcified plaque at the posterior left ICA origin and bulb resulting in 50 % stenosis with respect to the distal vessel. No additional plaque to the skull base. Vertebral arteries: No proximal right subclavian artery or right vertebral artery origin plaque or stenosis. The right vertebral appears dominant and is patent to the skull base without stenosis. Mild plaque in the proximal left subclavian artery without stenosis. Normal left vertebral artery origin. Tortuous left V1 segment. Minimally non dominant left vertebral artery is patent to the skull base without stenosis. CTA HEAD Posterior circulation: Mild bilateral calcified V4 segment  plaque without stenosis. Patent vertebrobasilar junction and basilar artery without stenosis. Patent PICA origins. Ectatic basilar tip. Normal SCA and PCA origins otherwise. Posterior communicating arteries are diminutive or absent. Bilateral PCA branches are within normal limits. Anterior circulation: Both ICA siphons are patent. Mild to moderate calcified plaque on the left without significant siphon stenosis. Minimal to mild calcified plaque on the right without siphon stenosis. Normal ophthalmic artery origins. Patent carotid termini. Normal MCA and ACA origins. Anterior communicating artery and bilateral ACA branches are within normal limits. Right MCA M1 segment and trifurcation are patent without stenosis. Right MCA branches are within normal limits. Left MCA M1 segment is patent, and a left MCA trifurcation occurs somewhat early and is patent without stenosis. A dominant posterior left M2 branch is occluded 11-12 millimeters distal to the trifurcation as seen on series 10, image 137 and series 11, image 24. The other left M2 branches seem to remain patent. Venous sinuses: Early contrast timing. The superior sagittal sinus, torcula and the dominant right transverse sinuses appear patent. Anatomic variants: Retropharyngeal course of both cervical carotids. Mildly dominant right vertebral. Review of the MIP images confirms the above findings IMPRESSION: 1. 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 This finding was communicated to Dr. ArorRory Percy1027 hours via the [facility] system. 2. CT Perfusion detects 7 mL of infarct core and 53 mL of ischemic penumbra in the corresponding Left MCA territory/operculum. 3. Superimposed 50% stenosis at the Left ICA origin due to calcified plaque. Mild left siphon plaque without stenosis. 4. Mild for age atherosclerosis elsewhere in the head and neck. Tortuous proximal great vessels and  incidental retropharyngeal course of both cervical carotids. Electronically Signed   By: H  HGenevie Ann.   On: 07/17/2019 22:36   CT HEAD CODE STROKE WO CONTRAST  Result Date: 07/17/2019 CLINICAL DATA:  Code stroke. 68 y79r old female with  aphasia. Last seen normal 2038 hours. EXAM: CT HEAD WITHOUT CONTRAST TECHNIQUE: Contiguous axial images were obtained from the base of the skull through the vertex without intravenous contrast. COMPARISON:  None. FINDINGS: Brain: Cerebral volume is within normal limits for age. No ventriculomegaly. No midline shift, mass effect, or evidence of intracranial mass lesion. No acute intracranial hemorrhage identified. Gray-white matter differentiation appears normal in the right hemisphere, deep gray nuclei and posterior fossa. And no cytotoxic edema is identified in the left MCA territory. Vascular: Calcified atherosclerosis at the skull base. Asymmetric vascular hyperdensity at the left MCA origin and in the sylvian fissure on series 3, images 11 and 12. Skull: No acute osseous abnormality identified. Sinuses/Orbits: Visualized paranasal sinuses and mastoids are clear. Other: No acute orbit or scalp soft tissue findings. ASPECTS [facility] Stroke Program Early CT Score) Total score (0-10 with 10 being normal): 10 IMPRESSION: 1. Positive for asymmetrically hyperdense Left MCA suspicious for emergent large vessel occlusion in this setting, but normal for age non contrast CT appearance of the brain. 2.  ASPECTS 10. 3. These results were communicated to Dr. Rory Percy at 10:01 pm on 07/17/2019 by text page via the [facility] messaging system. Electronically Signed   By: Genevie Ann M.D.   On: 07/17/2019 22:02    Procedures Procedures (including critical care time)  Medications Ordered in ED Medications  alteplase (ACTIVASE) 1 mg/mL infusion 84.1 mg (84.1 mg Intravenous New Bag/Given 07/17/19 2207)    Followed by  0.9 %  sodium chloride infusion (has no administration in time range)   stroke:  mapping our early stages of recovery book (has no administration in time range)  0.9 %  sodium chloride infusion (has no administration in time range)  acetaminophen (TYLENOL) tablet 650 mg (has no administration in time range)    Or  acetaminophen (TYLENOL) 160 MG/5ML solution 650 mg (has no administration in time range)    Or  acetaminophen (TYLENOL) suppository 650 mg (has no administration in time range)  senna-docusate (Senokot-S) tablet 1 tablet (has no administration in time range)  pantoprazole (PROTONIX) injection 40 mg (has no administration in time range)  labetalol (NORMODYNE) injection 20 mg (has no administration in time range)    And  clevidipine (CLEVIPREX) infusion 0.5 mg/mL (has no administration in time range)  iohexol (OMNIPAQUE) 350 MG/ML injection 100 mL (100 mLs Intravenous Contrast Given 07/17/19 2207)    ED Course  I have reviewed the triage vital signs and the nursing notes.  Pertinent labs & imaging results that were available during my care of the patient were reviewed by me and considered in my medical decision making (see chart for details).    MDM Rules/Calculators/A&P                      Pt presented with acute onset of aphasia.  Sx consistent with acute stroke.  CT scan without hemorrhage.  Pt given TPA.  Plan on admission to neuro icu with continued management by stroke service.  Final Clinical Impression(s) / ED Diagnoses Final diagnoses:  Acute ischemic stroke [facility])     Dorie Rank, MD 07/17/19 2240

## 2019-07-17 NOTE — H&P (Signed)
Neurology Consultation   CC: aphasia  History is obtained from:EMS, pt daughter, HPOA  HPI: Jordan Martinez is a 69 y.o. female past medical history of diabetes, endometrial cancer, HNPCC, hyperlipidemia, hypertension-last known normal around 8:30 PM today while having dinner with family of sudden onset of difficulty talking and unresponsiveness.  According to the EMTs, she was having dinner and had a sudden onset of an episode where she could not bring her words out her speech had not made much sense for a few minutes when the attending family member called 29.  On initial evaluation, EMTs thought that she had expressive aphasia.  Activated code stroke and brought the patient to the ER for an emergent stroke evaluation.  On my initial assessment, see detailed exam below NIH stroke scale was 3.  I called the healthcare power of attorney's phone number multiple times but was not able to reach him.  I then called her daughter, who lives in [place], [name], explained my assessment and offered IV TPA given the disabling symptoms of aphasia although NIH stroke scale was low.  After detailed conversation and answering all her questions, she agreed to IV TPA.  IV TPA was administered. Further imaging, CTA head and neck was performed given the cortical nature of the symptoms.  CTA head and neck showed a mid to distal left superior division M2 occlusion with a CT perfusion study showing a 7 cc area of completed stroke and a 53 cc penumbra. Again, given low stroke scale, this was again discussed with family in detail.  The daughter wanted to wait for the healthcare power of attorney, who had possibly done to another hospital. After explaining the emergent nature of decision making for the procedure, the procedure was explained in detail to the daughter, and a three-way call with Dr. Estanislado Pandy and she consented to thrombectomy. The H POA then later arrived and was able to speak with the patient.  No  chest pain shortness of breath nausea vomiting.  No preceding illness sickness.  LKW: 8:30 PM on 07/17/2019 tpa given?:  Yes Modified Rankin-0   ROS: Unable to obtain due to aphasia-obtained from daughter over the phone  Past Medical History:  Diagnosis Date  . Anxiety   . Arthritis   . Cataract    bilatereral cat. ext. with lens implant  . Depression   . Diabetes mellitus without complication (HCC)    diet controlled. States always has borderline HgbA1C  . Endometrial cancer ([place])   . Family history of colon cancer   . Family history of genetic mutation for hereditary nonpolyposis colorectal cancer (HNPCC)   . GERD (gastroesophageal reflux disease)   . High cholesterol   . History of kidney stones    years   . Hypertension   . Neuropathy   . Sensation of pressure in bladder area   . Uterine cramping       Family History  Adopted: Yes  Problem Relation Age of Onset  . Colon cancer Daughter 67       Lynch syndrome - MSH6 +  . Healthy Son     Social History:   reports that she has been smoking cigarettes. She has a 20.00 pack-year smoking history. She has never used smokeless tobacco. She reports current alcohol use. She reports that she does not use drugs.  Medications  Current Facility-Administered Medications:  .   stroke: mapping our early stages of recovery book, , Does not apply, Once, Amie Portland, MD .  [  COMPLETED] alteplase (ACTIVASE) 1 mg/mL infusion 84.1 mg, 0.9 mg/kg, Intravenous, Once, Stopped at 07/17/19 2310 **FOLLOWED BY** 0.9 %  sodium chloride infusion, 50 mL, Intravenous, Once, Amie Portland, MD .  0.9 %  sodium chloride infusion, , Intravenous, Continuous, Amie Portland, MD .  acetaminophen (TYLENOL) tablet 650 mg, 650 mg, Oral, Q4H PRN **OR** acetaminophen (TYLENOL) 160 MG/5ML solution 650 mg, 650 mg, Per Tube, Q4H PRN **OR** acetaminophen (TYLENOL) suppository 650 mg, 650 mg, Rectal, Q4H PRN, Amie Portland, MD .  aspirin 81 MG chewable tablet, ,  , ,  .  ceFAZolin (ANCEF) 2-4 GM/100ML-% IVPB, , , ,  .  labetalol (NORMODYNE) injection 20 mg, 20 mg, Intravenous, Once **AND** clevidipine (CLEVIPREX) infusion 0.5 mg/mL, 0-21 mg/hr, Intravenous, Continuous, Amie Portland, MD .  clopidogrel (PLAVIX) 300 MG tablet, , , ,  .  diphenhydrAMINE (BENADRYL) injection 25 mg, 25 mg, Intravenous, Once, Amie Portland, MD .  eptifibatide (INTEGRILIN) 20 MG/10ML injection, , , ,  .  nitroGLYCERIN 100 mcg/mL intra-arterial injection, , , ,  .  pantoprazole (PROTONIX) injection 40 mg, 40 mg, Intravenous, QHS, Amie Portland, MD .  senna-docusate (Senokot-S) tablet 1 tablet, 1 tablet, Oral, QHS PRN, Amie Portland, MD .  ticagrelor (BRILINTA) 90 MG tablet, , , ,  .  tirofiban (AGGRASTAT) 5-0.9 MG/100ML-% injection, , , ,   Current Outpatient Medications:  .  acetaminophen (TYLENOL) 325 MG tablet, Take 2 tablets (650 mg total) by mouth every 6 (six) hours as needed for mild pain, fever or headache (or Fever >/= 101)., Disp: 15 tablet, Rfl: 0 .  aspirin EC 81 MG tablet, Take 81 mg by mouth daily., Disp: , Rfl:  .  atenolol (TENORMIN) 50 MG tablet, Take 50 mg by mouth daily., Disp: , Rfl:  .  atorvastatin (LIPITOR) 20 MG tablet, Take 20 mg by mouth at bedtime. , Disp: , Rfl: 0 .  celecoxib (CELEBREX) 100 MG capsule, Take 200 mg by mouth 2 (two) times daily. , Disp: , Rfl:  .  cholecalciferol (VITAMIN D) 400 units TABS tablet, Take 400 Units by mouth daily., Disp: , Rfl:  .  clindamycin (CLEOCIN) 150 MG capsule, Take 150 mg by mouth 3 (three) times daily., Disp: , Rfl:  .  cyclobenzaprine (FLEXERIL) 5 MG tablet, Take 1 tablet (5 mg total) by mouth 3 (three) times daily., Disp: 90 tablet, Rfl: 1 .  lisinopril (ZESTRIL) 10 MG tablet, Take 10 mg by mouth daily., Disp: , Rfl:  .  metFORMIN (GLUCOPHAGE) 500 MG tablet, TAKE 1 TABLET BY MOUTH TWICE A DAY WITH MEALS, Disp: 60 tablet, Rfl: 1 .  Omega-3 Fatty Acids (FISH OIL) 1200 MG CAPS, Take 1,200 mg by mouth daily. ,  Disp: , Rfl:  .  omeprazole (PRILOSEC) 40 MG capsule, Take 1 capsule (40 mg total) by mouth daily., Disp: 90 capsule, Rfl: 3 .  Propylene Glycol (SYSTANE BALANCE) 0.6 % SOLN, Place 1 drop into both eyes every morning., Disp: , Rfl:    Exam: Current vital signs: BP 139/77   Pulse (!) 107   Resp 20   Wt 93.4 kg   SpO2 93%   BMI 34.80 kg/m  Vital signs in last 24 hours: Pulse Rate:  [97-116] 107 (01/11 2305) Resp:  [18-20] 20 (01/11 2305) BP: (119-177)/(71-134) 139/77 (01/11 2305) SpO2:  [93 %-100 %] 93 % (01/11 2305) Weight:  [93.4 kg] 93.4 kg (01/11 2200) General: Awake alert mildly distressed with her symptoms HEENT: Normocephalic atraumatic Lungs: Clear to auscultation Abdomen:  Soft nondistended nontender Extremities warm well perfused Cardiovascular: Regular rhythm Neurological Awake alert oriented to self and the fact that she is in the hospital. Her speech is dysarthric mildly. She is able to repeat short sentences but has difficulty repeating longer sentences and has a word salad.  Her speech is fluent.  Her comprehension is intact.  Naming is also mildly impaired. Cranial nerves: 2-12 intact Motor exam: No drift in any of the extremities Sensory exam: Intact to light touch Coordination: Intact finger-nose-finger testing bilaterally NIH stroke scale-3  Labs I have reviewed labs in epic and the results pertinent to this consultation are: CBC    Component Value Date/Time   WBC 10.4 07/17/2019 2152   RBC 4.59 07/17/2019 2152   HGB 16.0 (H) 07/17/2019 2157   HGB 13.0 07/18/2018 1611   HCT 47.0 (H) 07/17/2019 2157   PLT 247 07/17/2019 2152   PLT 247 07/18/2018 1611   MCV 102.0 (H) 07/17/2019 2152   MCH 33.3 07/17/2019 2152   MCHC 32.7 07/17/2019 2152   RDW 13.5 07/17/2019 2152   LYMPHSABS 4.0 07/17/2019 2152   MONOABS 1.0 07/17/2019 2152   EOSABS 0.2 07/17/2019 2152   BASOSABS 0.1 07/17/2019 2152    CMP     Component Value Date/Time   NA 142 07/17/2019  2157   K 3.5 07/17/2019 2157   CL 108 07/17/2019 2157   CO2 22 07/17/2019 2152   GLUCOSE 123 (H) 07/17/2019 2157   BUN 22 07/17/2019 2157   CREATININE 0.60 07/17/2019 2157   CREATININE 0.82 10/26/2018 0900   CALCIUM 9.2 07/17/2019 2152   PROT 6.9 07/17/2019 2152   ALBUMIN 4.2 07/17/2019 2152   AST 33 07/17/2019 2152   AST 22 10/26/2018 0900   ALT 37 07/17/2019 2152   ALT 32 10/26/2018 0900   ALKPHOS 80 07/17/2019 2152   BILITOT 1.2 07/17/2019 2152   BILITOT 0.3 10/26/2018 0900   GFRNONAA >60 07/17/2019 2152   GFRNONAA >60 10/26/2018 0900   GFRAA >60 07/17/2019 2152   GFRAA >60 10/26/2018 0900    Imaging I have reviewed the images obtained CT head-no bleed.  Aspects 10.  Questionable hyperdense left MCA CTA head and neck left M2 occlusion. CT perfusion-7 cc core, 53 cc penumbra in the corresponding territory to above.  Assessment:  69 year old woman with above past medical history with sudden onset of expressive aphasia, seen within 4-1/2 hours of presentation-given IV TPA. Due to the low stroke scale, detailed discussion with family over the phone multiple times regarding intervention because of the left M2 occlusion. Family, daughter agreed to intervention under the IRB protocol. Detailed discussion with endovascular over the phone prior to activation of IR code stroke.  TPA was given within 22 minutes of arrival.  Started developing some redness around the IV site-asked by nurses whether to stop the infusion.  Decision made not to stop the infusion, give IV Benadryl and monitor clinically. No evidence of angioedema on examination.   Impression: Acute ischemic stroke-likely cardioembolic versus hypercoagulable given the history of malignancies  Recommendations: Admit to ICU If reperfusion successful, blood pressure parameters-systolic blood pressure between 120-140.  If revascularization remains unsuccessful, then follow TPA parameters of systolic blood pressure less  than 180. Follow status post TPA vitals and neurochecks protocol. No aspirin or anticoagulants for at least 24 hours from TPA.  Await 24-hour imaging prior to decision on resumption of anticoagulation or antiplatelet. MRI brain 2D echo A1c Lipid panel Fingerstick and sliding scale insulin Gentle hydration  with 75 cc of normal saline an hour. Check morning labs Monitor infusion site for any further worsening of the redness. Case discussed in length with Dr. Estanislado Pandy, and the ED provider. Spoke with the daughter on multiple occasions-prior to giving IV TPA, after giving IV TPA and also while obtaining consent for the intervention. IR consent and IRB form filled with RN witnessing over the phone due to daughter being in [place].  -- Amie Portland, MD [facility] Pager: [phone] If 7pm to 7am, please call on call as listed on AMION.   CRITICAL CARE ATTESTATION Performed by: Amie Portland, MD Total critical care time: 65 minutes  Critical care time was exclusive of separately billable procedures and treating other patients and/or supervising APPs/Residents/Students Critical care was necessary to treat or prevent imminent or life-threatening deterioration due to acute ischemic stroke, IV thrombolysis, decision on mechanical thrombectomy This patient is critically ill and at significant risk for neurological worsening and/or death and care requires constant monitoring. Critical care was time spent personally by me on the following activities: development of treatment plan with patient and/or surrogate as well as nursing, discussions with consultants, evaluation of patient's response to treatment, examination of patient, obtaining history from patient or surrogate, ordering and performing treatments and interventions, ordering and review of laboratory studies, ordering and review of radiographic studies, pulse oximetry, re-evaluation of patient's condition, participation in  multidisciplinary rounds and medical decision making of high complexity in the care of this patient.

## 2019-07-17 NOTE — Anesthesia Preprocedure Evaluation (Signed)
Anesthesia Evaluation  Patient identified by MRN, date of birth, ID band Patient awake    Reviewed: Allergy & Precautions, H&P , NPO status , Patient's Chart, lab work & pertinent test results, reviewed documented beta blocker date and time   Airway Mallampati: III  TM Distance: >3 FB Neck ROM: Full    Dental no notable dental hx. (+) Teeth Intact, Dental Advisory Given   Pulmonary Current Smoker,    Pulmonary exam normal breath sounds clear to auscultation       Cardiovascular hypertension, Pt. on medications and Pt. on home beta blockers  Rhythm:Regular Rate:Normal     Neuro/Psych Anxiety Depression CVA, Residual Symptoms    GI/Hepatic Neg liver ROS, GERD  Medicated and Controlled,  Endo/Other  diabetes, Type 2, Oral Hypoglycemic Agents  Renal/GU negative Renal ROS  negative genitourinary   Musculoskeletal  (+) Arthritis , Osteoarthritis,    Abdominal   Peds  Hematology negative hematology ROS (+)   Anesthesia Other Findings   Reproductive/Obstetrics negative OB ROS                             Anesthesia Physical Anesthesia Plan  ASA: III and emergent  Anesthesia Plan: General   Post-op Pain Management:    Induction: Intravenous, Rapid sequence and Cricoid pressure planned  PONV Risk Score and Plan: 3 and Ondansetron, Dexamethasone and Treatment may vary due to age or medical condition  Airway Management Planned: Oral ETT  Additional Equipment: Arterial line  Intra-op Plan:   Post-operative Plan: Possible Post-op intubation/ventilation  Informed Consent: I have reviewed the patients History and Physical, chart, labs and discussed the procedure including the risks, benefits and alternatives for the proposed anesthesia with the patient or authorized representative who has indicated his/her understanding and acceptance.     Dental advisory given  Plan Discussed with:  CRNA  Anesthesia Plan Comments:         Anesthesia Quick Evaluation

## 2019-07-18 ENCOUNTER — Inpatient Hospital Stay (HOSPITAL_COMMUNITY): Payer: Medicare Other

## 2019-07-18 ENCOUNTER — Encounter (HOSPITAL_COMMUNITY): Payer: Self-pay | Admitting: Student in an Organized Health Care Education/Training Program

## 2019-07-18 DIAGNOSIS — I63412 Cerebral infarction due to embolism of left middle cerebral artery: Principal | ICD-10-CM

## 2019-07-18 DIAGNOSIS — I6389 Other cerebral infarction: Secondary | ICD-10-CM

## 2019-07-18 DIAGNOSIS — I639 Cerebral infarction, unspecified: Secondary | ICD-10-CM

## 2019-07-18 DIAGNOSIS — I609 Nontraumatic subarachnoid hemorrhage, unspecified: Secondary | ICD-10-CM

## 2019-07-18 DIAGNOSIS — E782 Mixed hyperlipidemia: Secondary | ICD-10-CM

## 2019-07-18 DIAGNOSIS — I6602 Occlusion and stenosis of left middle cerebral artery: Secondary | ICD-10-CM | POA: Diagnosis present

## 2019-07-18 DIAGNOSIS — I10 Essential (primary) hypertension: Secondary | ICD-10-CM

## 2019-07-18 HISTORY — PX: IR CT HEAD LTD: IMG2386

## 2019-07-18 HISTORY — PX: IR PERCUTANEOUS ART THROMBECTOMY/INFUSION INTRACRANIAL INC DIAG ANGIO: IMG6087

## 2019-07-18 LAB — ECHOCARDIOGRAM COMPLETE
Height: 64 in
Weight: 3241.64 oz

## 2019-07-18 LAB — URINALYSIS, ROUTINE W REFLEX MICROSCOPIC
Bacteria, UA: NONE SEEN
Bilirubin Urine: NEGATIVE
Glucose, UA: NEGATIVE mg/dL
Hgb urine dipstick: NEGATIVE
Ketones, ur: NEGATIVE mg/dL
Leukocytes,Ua: NEGATIVE
Nitrite: NEGATIVE
Protein, ur: 30 mg/dL — AB
Specific Gravity, Urine: 1.018 (ref 1.005–1.030)
pH: 5 (ref 5.0–8.0)

## 2019-07-18 LAB — CBC WITH DIFFERENTIAL/PLATELET
Abs Immature Granulocytes: 0.03 K/uL (ref 0.00–0.07)
Basophils Absolute: 0 K/uL (ref 0.0–0.1)
Basophils Relative: 0 %
Eosinophils Absolute: 0 K/uL (ref 0.0–0.5)
Eosinophils Relative: 0 %
HCT: 38.3 % (ref 36.0–46.0)
Hemoglobin: 12.9 g/dL (ref 12.0–15.0)
Immature Granulocytes: 0 %
Lymphocytes Relative: 13 %
Lymphs Abs: 0.9 K/uL (ref 0.7–4.0)
MCH: 33.3 pg (ref 26.0–34.0)
MCHC: 33.7 g/dL (ref 30.0–36.0)
MCV: 99 fL (ref 80.0–100.0)
Monocytes Absolute: 0.1 K/uL (ref 0.1–1.0)
Monocytes Relative: 1 %
Neutro Abs: 6.1 K/uL (ref 1.7–7.7)
Neutrophils Relative %: 86 %
Platelets: 211 K/uL (ref 150–400)
RBC: 3.87 MIL/uL (ref 3.87–5.11)
RDW: 13.7 % (ref 11.5–15.5)
WBC: 7.2 K/uL (ref 4.0–10.5)
nRBC: 0 % (ref 0.0–0.2)

## 2019-07-18 LAB — LIPID PANEL
Cholesterol: 224 mg/dL — ABNORMAL HIGH (ref 0–200)
HDL: 86 mg/dL (ref >40)
LDL Cholesterol: 120 mg/dL — ABNORMAL HIGH (ref 0–99)
Total CHOL/HDL Ratio: 2.6 RATIO
Triglycerides: 91 mg/dL (ref <150)
VLDL: 18 mg/dL (ref 0–40)

## 2019-07-18 LAB — GLUCOSE, CAPILLARY
Glucose-Capillary: 121 mg/dL — ABNORMAL HIGH (ref 70–99)
Glucose-Capillary: 202 mg/dL — ABNORMAL HIGH (ref 70–99)
Glucose-Capillary: 185 mg/dL — ABNORMAL HIGH (ref 70–99)
Glucose-Capillary: 132 mg/dL — ABNORMAL HIGH (ref 70–99)
Glucose-Capillary: 192 mg/dL — ABNORMAL HIGH (ref 70–99)

## 2019-07-18 LAB — RAPID URINE DRUG SCREEN, HOSP PERFORMED
Amphetamines: NOT DETECTED
Barbiturates: NOT DETECTED
Benzodiazepines: NOT DETECTED
Cocaine: NOT DETECTED
Opiates: NOT DETECTED
Tetrahydrocannabinol: NOT DETECTED

## 2019-07-18 LAB — BASIC METABOLIC PANEL
Anion gap: 8 (ref 5–15)
BUN: 15 mg/dL (ref 8–23)
CO2: 23 mmol/L (ref 22–32)
Calcium: 8.3 mg/dL — ABNORMAL LOW (ref 8.9–10.3)
Chloride: 110 mmol/L (ref 98–111)
Creatinine, Ser: 0.71 mg/dL (ref 0.44–1.00)
GFR calc Af Amer: >60 mL/min (ref >60)
GFR calc non Af Amer: >60 mL/min (ref >60)
Glucose, Bld: 204 mg/dL — ABNORMAL HIGH (ref 70–99)
Potassium: 4.1 mmol/L (ref 3.5–5.1)
Sodium: 141 mmol/L (ref 135–145)

## 2019-07-18 LAB — HEMOGLOBIN A1C
Hgb A1c MFr Bld: 7 % — ABNORMAL HIGH (ref 4.8–5.6)
Mean Plasma Glucose: 154.2 mg/dL

## 2019-07-18 LAB — MRSA PCR SCREENING: MRSA by PCR: NEGATIVE

## 2019-07-18 LAB — HIV ANTIBODY (ROUTINE TESTING W REFLEX): HIV Screen 4th Generation wRfx: NONREACTIVE

## 2019-07-18 MED ORDER — SUCCINYLCHOLINE 20MG/ML (10ML) SYRINGE FOR MEDFUSION PUMP - OPTIME
INTRAMUSCULAR | Status: DC | PRN
  Administered 2019-07-17: 40 mg via INTRAVENOUS

## 2019-07-18 MED ORDER — TRAZODONE HCL 100 MG PO TABS
100.0000 mg | ORAL_TABLET | Freq: Every day | ORAL | Status: DC
  Administered 2019-07-18: 100 mg via ORAL
  Filled 2019-07-18: qty 2

## 2019-07-18 MED ORDER — INSULIN ASPART 100 UNIT/ML ~~LOC~~ SOLN
0.0000 [IU] | Freq: Three times a day (TID) | SUBCUTANEOUS | Status: DC
  Administered 2019-07-18: 3 [IU] via SUBCUTANEOUS
  Administered 2019-07-18: 1 [IU] via SUBCUTANEOUS
  Administered 2019-07-18: 2 [IU] via SUBCUTANEOUS
  Administered 2019-07-19 (×2): 1 [IU] via SUBCUTANEOUS

## 2019-07-18 MED ORDER — SUGAMMADEX SODIUM 200 MG/2ML IV SOLN
INTRAVENOUS | Status: DC | PRN
  Administered 2019-07-18: 200 mg via INTRAVENOUS

## 2019-07-18 MED ORDER — LIDOCAINE HCL (CARDIAC) PF 100 MG/5ML IV SOSY
PREFILLED_SYRINGE | INTRAVENOUS | Status: DC | PRN
  Administered 2019-07-17: 60 mg via INTRATRACHEAL

## 2019-07-18 MED ORDER — ONDANSETRON HCL 4 MG/2ML IJ SOLN: 4.0000 mg | Freq: Four times a day (QID) | INTRAMUSCULAR | Status: DC | PRN

## 2019-07-18 MED ORDER — NITROGLYCERIN 1 MG/10 ML FOR IR/CATH LAB
INTRA_ARTERIAL | Status: DC | PRN
  Administered 2019-07-18 (×4): 25 ug via INTRA_ARTERIAL

## 2019-07-18 MED ORDER — DEXAMETHASONE SODIUM PHOSPHATE 10 MG/ML IJ SOLN
INTRAMUSCULAR | Status: DC | PRN
  Administered 2019-07-18: 10 mg via INTRAVENOUS

## 2019-07-18 MED ORDER — PROPOFOL 10 MG/ML IV BOLUS
INTRAVENOUS | Status: DC | PRN
  Administered 2019-07-17: 110 mg via INTRAVENOUS

## 2019-07-18 MED ORDER — ATORVASTATIN CALCIUM 40 MG PO TABS
40.0000 mg | ORAL_TABLET | Freq: Every day | ORAL | Status: DC
  Administered 2019-07-18: 40 mg via ORAL
  Filled 2019-07-18: qty 1

## 2019-07-18 MED ORDER — LACTATED RINGERS IV SOLN: INTRAVENOUS | Status: DC | PRN

## 2019-07-18 MED ORDER — ATORVASTATIN CALCIUM 10 MG PO TABS: 20.0000 mg | ORAL_TABLET | Freq: Every day | ORAL | Status: DC

## 2019-07-18 MED ORDER — ONDANSETRON HCL 4 MG/2ML IJ SOLN
INTRAMUSCULAR | Status: DC | PRN
  Administered 2019-07-18: 4 mg via INTRAVENOUS

## 2019-07-18 MED ORDER — LISINOPRIL 10 MG PO TABS
10.0000 mg | ORAL_TABLET | Freq: Every day | ORAL | Status: DC
  Administered 2019-07-18 – 2019-07-19 (×2): 10 mg via ORAL
  Filled 2019-07-18 (×2): qty 1

## 2019-07-18 MED ORDER — VITAMIN D 25 MCG (1000 UNIT) PO TABS
500.0000 [IU] | ORAL_TABLET | Freq: Every day | ORAL | Status: DC
  Administered 2019-07-18 – 2019-07-19 (×2): 500 [IU] via ORAL
  Filled 2019-07-18 (×2): qty 1

## 2019-07-18 MED ORDER — SODIUM CHLORIDE 0.9 % IV SOLN: INTRAVENOUS | Status: DC

## 2019-07-18 MED ORDER — CLEVIDIPINE BUTYRATE 0.5 MG/ML IV EMUL: 0.0000 mg/h | INTRAVENOUS | Status: AC

## 2019-07-18 MED ORDER — CEFAZOLIN SODIUM-DEXTROSE 2-3 GM-%(50ML) IV SOLR
INTRAVENOUS | Status: DC | PRN
  Administered 2019-07-17: 2 g via INTRAVENOUS

## 2019-07-18 MED ORDER — ACETAMINOPHEN 650 MG RE SUPP: 650.0000 mg | RECTAL | Status: DC | PRN

## 2019-07-18 MED ORDER — PANTOPRAZOLE SODIUM 40 MG PO TBEC
40.0000 mg | DELAYED_RELEASE_TABLET | Freq: Every day | ORAL | Status: DC
  Administered 2019-07-18 – 2019-07-19 (×2): 40 mg via ORAL
  Filled 2019-07-18 (×2): qty 1

## 2019-07-18 MED ORDER — SODIUM CHLORIDE 0.9 % IV SOLN: INTRAVENOUS | Status: DC | PRN

## 2019-07-18 MED ORDER — PHENYLEPHRINE HCL-NACL 10-0.9 MG/250ML-% IV SOLN
INTRAVENOUS | Status: DC | PRN
  Administered 2019-07-18: 25 ug/min via INTRAVENOUS

## 2019-07-18 MED ORDER — ACETAMINOPHEN 325 MG PO TABS
650.0000 mg | ORAL_TABLET | ORAL | Status: DC | PRN
  Administered 2019-07-18 – 2019-07-19 (×5): 650 mg via ORAL
  Filled 2019-07-18 (×5): qty 2

## 2019-07-18 MED ORDER — PHENYLEPHRINE HCL (PRESSORS) 10 MG/ML IV SOLN
INTRAVENOUS | Status: DC | PRN
  Administered 2019-07-18: 40 ug via INTRAVENOUS
  Administered 2019-07-18: 80 ug via INTRAVENOUS
  Administered 2019-07-18: 40 ug via INTRAVENOUS

## 2019-07-18 MED ORDER — ATENOLOL 25 MG PO TABS
50.0000 mg | ORAL_TABLET | Freq: Every day | ORAL | Status: DC
  Administered 2019-07-18 – 2019-07-19 (×2): 50 mg via ORAL
  Filled 2019-07-18: qty 1
  Filled 2019-07-18: qty 2

## 2019-07-18 MED ORDER — LABETALOL HCL 5 MG/ML IV SOLN: 5.0000 mg | INTRAVENOUS | Status: DC | PRN

## 2019-07-18 MED ORDER — ACETAMINOPHEN 160 MG/5ML PO SOLN: 650.0000 mg | ORAL | Status: DC | PRN

## 2019-07-18 MED ORDER — ROCURONIUM 10MG/ML (10ML) SYRINGE FOR MEDFUSION PUMP - OPTIME
INTRAVENOUS | Status: DC | PRN
  Administered 2019-07-17: 50 mg via INTRAVENOUS
  Administered 2019-07-18 (×2): 10 mg via INTRAVENOUS

## 2019-07-18 NOTE — Plan of Care (Signed)
Repeat CTH at 0500 with the left peri-insular small SAH (could still be contrast, but likley small focus of bleed).  Recs: [id number] strict No ASA/anticoag  -- Amie Portland, MD [facility] Pager: [phone] If 7pm to 7am, please call on call as listed on AMION.

## 2019-07-18 NOTE — Procedures (Signed)
S/P Lt common carotid arteriogram followed by complete revascularization of occluded distal M 2   seg of inf division with x 1 pass with soliatire 33mm x 40 mm x retriever and x pass with trevoprovue 70mmx 81mm retrieve and prox aspiration achieving a TICI 3 revascularization. S.Dionisios Ricci MD

## 2019-07-18 NOTE — Evaluation (Signed)
Occupational Therapy Evaluation Patient Details Name: Jordan Martinez MRN: [id number] DOB: 09-20-1950 Today's Date: 07/18/2019    History of Present Illness [name] is a 69 y.o. female s/p L MCA infarct due to left M2 occlusion s/p tPA and IR with small SAH left sylvian fissure. Pt had emergent mechanical thrombectomy of left MCA inferior division M2 segment occlusion achieving a TICI 3 revascularization 07/18/2019. Received tPA 07/17/2019 at 2207. PMHx: diabetes, endometrial cancer, HNPCC, hyperlipidemia, hypertension.   Clinical Impression   Pt PTA: Pt independent living in ILF. Pt currently, pt performing all functional mobility with no AD and good balance. Pt performing all ADL tasks with independence only assist for wires. Pt with no focal deficits at this time. Pt encouraged to continue to ambulate with nursing staff. Pt does not require continued OT skilled services as pt at functional baseline. OT signing off.    Follow Up Recommendations  No OT follow up    Equipment Recommendations  None recommended by OT    Recommendations for Other Services       Precautions / Restrictions Precautions Precautions: None Restrictions Weight Bearing Restrictions: No      Mobility Bed Mobility Overal bed mobility: Independent                Transfers Overall transfer level: Independent Equipment used: None                  Balance Overall balance assessment: No apparent balance deficits (not formally assessed);Independent                                         ADL either performed or assessed with clinical judgement   ADL Overall ADL's : Modified independent                                             Vision Baseline Vision/History: Wears glasses Wears Glasses: Reading only Patient Visual Report: No change from baseline Vision Assessment?: Yes Eye Alignment: Within Functional Limits Ocular Range of Motion: Within  Functional Limits Alignment/Gaze Preference: Within Defined Limits Tracking/Visual Pursuits: Able to track stimulus in all quads without difficulty     Perception     Praxis      Pertinent Vitals/Pain Pain Assessment: No/denies pain     Hand Dominance Right   Extremity/Trunk Assessment Upper Extremity Assessment Upper Extremity Assessment: Overall WFL for tasks assessed   Lower Extremity Assessment Lower Extremity Assessment: Overall WFL for tasks assessed   Cervical / Trunk Assessment Cervical / Trunk Assessment: Normal   Communication Communication Communication: No difficulties   Cognition Arousal/Alertness: Awake/alert Behavior During Therapy: WFL for tasks assessed/performed Overall Cognitive Status: Within Functional Limits for tasks assessed                                     General Comments  Pt 78 BPM and reached 145 BPM, but stayed there for only a few seconds.    Exercises     Shoulder Instructions      Home Living Family/patient expects to be discharged to:: Private residence Living Arrangements: Alone Available Help at Discharge: Friend(s);Available 24 hours/day Type of Home: Independent living facility Home Access: Level entry  Home Layout: One level     Bathroom Shower/Tub: Occupational psychologist: Handicapped height Bathroom Accessibility: Yes   Home Equipment: None          Prior Functioning/Environment Level of Independence: Independent        Comments: Drives, independent with ADL/IADL        OT Problem List:        OT Treatment/Interventions:      OT Goals(Current goals can be found in the care plan section) Acute Rehab OT Goals Patient Stated Goal: continue therapy at [facility]  OT Frequency:     Barriers to D/C:            Co-evaluation              AM-PAC OT "6 Clicks" Daily Activity     Outcome Measure Help from another person eating meals?: None Help from another person  taking care of personal grooming?: None Help from another person toileting, which includes using toliet, bedpan, or urinal?: None Help from another person bathing (including washing, rinsing, drying)?: None Help from another person to put on and taking off regular upper body clothing?: None Help from another person to put on and taking off regular lower body clothing?: None 6 Click Score: 24   End of Session Equipment Utilized During Treatment: Gait belt Nurse Communication: Mobility status  Activity Tolerance: Patient tolerated treatment well Patient left: in chair;with call bell/phone within reach;with family/visitor present  OT Visit Diagnosis: Unsteadiness on feet (R26.81)                Time: [id number] OT Time Calculation (min): 27 min Charges:  OT General Charges $OT Visit: 1 Visit OT Evaluation $OT Eval Moderate Complexity: [place] C OTR/L Acute Rehabilitation Services Pager: [phone] Office: [phone]   Tanishia Lemaster C 07/18/2019, 4:25 PM

## 2019-07-18 NOTE — Progress Notes (Signed)
Referring Physician(s): Code Stroke- Rory Percy, Ashish  Supervising Physician: Luanne Bras  Patient Status:  [facility] - In-pt  Chief Complaint: "Headache"  Subjective:  History of acute CVA s/p cerebral arteriogram with emergent mechanical thrombectomy of left MCA inferior division M2 segment occlusion achieving a TICI 3 revascularization 07/18/2019. Patient awake and alert laying in bed. Complains of frontal headache. Can spontaneously move all extremities. Right groin incision c/d/i.   Allergies: Amlodipine, Compazine [prochlorperazine edisylate], Gabapentin, Lyrica [pregabalin], Sertraline, and Chlorhexidine  Medications: Prior to Admission medications   Medication Sig Start Date End Date Taking? Authorizing Provider  acetaminophen (TYLENOL) 325 MG tablet Take 2 tablets (650 mg total) by mouth every 6 (six) hours as needed for mild pain, fever or headache (or Fever >/= 101). 06/27/18   Roxan Hockey, MD  aspirin EC 81 MG tablet Take 81 mg by mouth daily.    [provider]  atenolol (TENORMIN) 50 MG tablet Take 50 mg by mouth daily.    [provider]  atorvastatin (LIPITOR) 20 MG tablet Take 20 mg by mouth at bedtime.  01/17/18   [provider]  celecoxib (CELEBREX) 100 MG capsule Take 200 mg by mouth 2 (two) times daily.     [provider]  cholecalciferol (VITAMIN D) 400 units TABS tablet Take 400 Units by mouth daily.    [provider]  clindamycin (CLEOCIN) 150 MG capsule Take 150 mg by mouth 3 (three) times daily.    [provider]  cyclobenzaprine (FLEXERIL) 5 MG tablet Take 1 tablet (5 mg total) by mouth 3 (three) times daily. 09/09/18   Heath Lark, MD  lisinopril (ZESTRIL) 10 MG tablet Take 10 mg by mouth daily.    [provider]  metFORMIN (GLUCOPHAGE) 500 MG tablet TAKE 1 TABLET BY MOUTH TWICE A DAY WITH MEALS 05/05/19   Magrinat, Virgie Dad, MD  Omega-3 Fatty Acids (FISH OIL) 1200 MG CAPS Take 1,200  mg by mouth daily.     [provider]  omeprazole (PRILOSEC) 40 MG capsule Take 1 capsule (40 mg total) by mouth daily. 12/09/18   Thornton Park, MD  Propylene Glycol (SYSTANE BALANCE) 0.6 % SOLN Place 1 drop into both eyes every morning.    [provider]     Vital Signs: BP 134/66 (BP Location: Right Arm)   Pulse (!) 110   Temp 98.8 F (37.1 C) (Oral)   Resp (!) 22   Ht 5\' 4"  (1.626 m)   Wt 202 lb 9.6 oz (91.9 kg)   SpO2 92%   BMI 34.78 kg/m   Physical Exam Vitals and nursing note reviewed.  Constitutional:      General: She is not in acute distress.    Appearance: Normal appearance.  Pulmonary:     Effort: Pulmonary effort is normal. No respiratory distress.  Skin:    General: Skin is warm and dry.     Comments: Right groin incision soft without active bleeding or hematoma.  Neurological:     Mental Status: She is alert.     Comments: Alert, awake, and oriented x3. Speech and comprehension intact. PERRL bilaterally. EOMs intact bilaterally without nystagmus or subjective diplopia. No facial asymmetry. Tongue midline. Motor power symmetric proportional to effort. No pronator drift. Fine motor and coordination intact and symmetric. Distal pulses 2+ bilaterally.  Psychiatric:        Mood and Affect: Mood normal.        Behavior: Behavior normal.     Imaging:  CT Code Stroke CTA Head W/WO contrast  Result Date: 07/17/2019 CLINICAL DATA:  69 year old female code stroke with aphasia and asymmetric left MCA hyperdensity on plain head CT today. EXAM: CT ANGIOGRAPHY HEAD AND NECK CT PERFUSION BRAIN TECHNIQUE: Multidetector CT imaging of the head and neck was performed using the standard protocol during bolus administration of intravenous contrast. Multiplanar CT image reconstructions and MIPs were obtained to evaluate the vascular anatomy. Carotid stenosis measurements (when applicable) are obtained utilizing NASCET criteria, using the distal internal  carotid diameter as the denominator. Multiphase CT imaging of the brain was performed following IV bolus contrast injection. Subsequent parametric perfusion maps were calculated using RAPID software. CONTRAST:  160mL OMNIPAQUE IOHEXOL 350 MG/ML SOLN COMPARISON:  Plain head CT 2157 hours today. FINDINGS: CT Brain Perfusion Findings: ASPECTS: 10 CBF (<30%) Volume: 55mL Perfusion (Tmax>6.0s) volume: 72mL hypoperfusion index of 0.4. Mismatch Volume: 38mL Infarction Location:Left MCA, operculum white matter. Skeleton: Cervical spine degeneration. No acute osseous abnormality identified. Upper chest: Negative. Other neck: Partially retropharyngeal course of both carotids and carotid bifurcations. No acute findings in the neck. Aortic arch: 3 vessel arch configuration with mild to moderate calcified arch atherosclerosis. Tortuous great vessel origins. Right carotid system: Tortuous brachiocephalic artery and proximal right CCA without stenosis. Retropharyngeal right carotid bifurcation with mild cervical right ICA plaque and no stenosis. Left carotid system: Tortuous proximal left CCA without stenosis. Retropharyngeal left carotid bifurcation with bulky calcified plaque at the posterior left ICA origin and bulb resulting in 50 % stenosis with respect to the distal vessel. No additional plaque to the skull base. Vertebral arteries: No proximal right subclavian artery or right vertebral artery origin plaque or stenosis. The right vertebral appears dominant and is patent to the skull base without stenosis. Mild plaque in the proximal left subclavian artery without stenosis. Normal left vertebral artery origin. Tortuous left V1 segment. Minimally non dominant left vertebral artery is patent to the skull base without stenosis. CTA HEAD Posterior circulation: Mild bilateral calcified V4 segment plaque without stenosis. Patent vertebrobasilar junction and basilar artery without stenosis. Patent PICA origins. Ectatic basilar tip.  Normal SCA and PCA origins otherwise. Posterior communicating arteries are diminutive or absent. Bilateral PCA branches are within normal limits. Anterior circulation: Both ICA siphons are patent. Mild to moderate calcified plaque on the left without significant siphon stenosis. Minimal to mild calcified plaque on the right without siphon stenosis. Normal ophthalmic artery origins. Patent carotid termini. Normal MCA and ACA origins. Anterior communicating artery and bilateral ACA branches are within normal limits. Right MCA M1 segment and trifurcation are patent without stenosis. Right MCA branches are within normal limits. Left MCA M1 segment is patent, and a left MCA trifurcation occurs somewhat early and is patent without stenosis. A dominant posterior left M2 branch is occluded 11-12 millimeters distal to the trifurcation as seen on series 10, image 137 and series 11, image 24. The other left M2 branches seem to remain patent. Venous sinuses: Early contrast timing. The superior sagittal sinus, torcula and the dominant right transverse sinuses appear patent. Anatomic variants: Retropharyngeal course of both cervical carotids. Mildly dominant right vertebral. Review of the MIP images confirms the above findings IMPRESSION: 1. 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 This finding was communicated to Dr. Rory Percy at 1027 hours via the [facility] messaging system. 2. CT Perfusion detects 7 mL of infarct core and 53 mL of ischemic penumbra in the  corresponding Left MCA territory/operculum. 3. Superimposed 50% stenosis at the Left ICA origin due to calcified plaque. Mild left siphon plaque without stenosis. 4. Mild for age atherosclerosis elsewhere in the head and neck. Tortuous proximal great vessels and incidental retropharyngeal course of both cervical carotids. Electronically Signed   By: Genevie Ann M.D.   On: 07/17/2019 22:36   CT HEAD  WO CONTRAST  Result Date: 07/18/2019 CLINICAL DATA:  Stroke follow-up. EXAM: CT HEAD WITHOUT CONTRAST TECHNIQUE: Contiguous axial images were obtained from the base of the skull through the vertex without intravenous contrast. COMPARISON:  Limited head CT catheter angiography earlier today. FINDINGS: Brain: No detectable infarct. Small volume contrast/blood product extravasation along the left sylvian fissure is unchanged from intraoperative CT. No hydrocephalus or shift. Vascular: Negative Skull: Negative Sinuses/Orbits: Negative IMPRESSION: 1. Stable small volume subarachnoid contrast/blood along the left sylvian fissure. 2. No detectable infarct. Electronically Signed   By: Monte Fantasia M.D.   On: 07/18/2019 05:13   CT Code Stroke CTA Neck W/WO contrast  Result Date: 07/17/2019 CLINICAL DATA:  69 year old female code stroke with aphasia and asymmetric left MCA hyperdensity on plain head CT today. EXAM: CT ANGIOGRAPHY HEAD AND NECK CT PERFUSION BRAIN TECHNIQUE: Multidetector CT imaging of the head and neck was performed using the standard protocol during bolus administration of intravenous contrast. Multiplanar CT image reconstructions and MIPs were obtained to evaluate the vascular anatomy. Carotid stenosis measurements (when applicable) are obtained utilizing NASCET criteria, using the distal internal carotid diameter as the denominator. Multiphase CT imaging of the brain was performed following IV bolus contrast injection. Subsequent parametric perfusion maps were calculated using RAPID software. CONTRAST:  169mL OMNIPAQUE IOHEXOL 350 MG/ML SOLN COMPARISON:  Plain head CT 2157 hours today. FINDINGS: CT Brain Perfusion Findings: ASPECTS: 10 CBF (<30%) Volume: 37mL Perfusion (Tmax>6.0s) volume: 74mL hypoperfusion index of 0.4. Mismatch Volume: 56mL Infarction Location:Left MCA, operculum white matter. Skeleton: Cervical spine degeneration. No acute osseous abnormality identified. Upper chest: Negative.  Other neck: Partially retropharyngeal course of both carotids and carotid bifurcations. No acute findings in the neck. Aortic arch: 3 vessel arch configuration with mild to moderate calcified arch atherosclerosis. Tortuous great vessel origins. Right carotid system: Tortuous brachiocephalic artery and proximal right CCA without stenosis. Retropharyngeal right carotid bifurcation with mild cervical right ICA plaque and no stenosis. Left carotid system: Tortuous proximal left CCA without stenosis. Retropharyngeal left carotid bifurcation with bulky calcified plaque at the posterior left ICA origin and bulb resulting in 50 % stenosis with respect to the distal vessel. No additional plaque to the skull base. Vertebral arteries: No proximal right subclavian artery or right vertebral artery origin plaque or stenosis. The right vertebral appears dominant and is patent to the skull base without stenosis. Mild plaque in the proximal left subclavian artery without stenosis. Normal left vertebral artery origin. Tortuous left V1 segment. Minimally non dominant left vertebral artery is patent to the skull base without stenosis. CTA HEAD Posterior circulation: Mild bilateral calcified V4 segment plaque without stenosis. Patent vertebrobasilar junction and basilar artery without stenosis. Patent PICA origins. Ectatic basilar tip. Normal SCA and PCA origins otherwise. Posterior communicating arteries are diminutive or absent. Bilateral PCA branches are within normal limits. Anterior circulation: Both ICA siphons are patent. Mild to moderate calcified plaque on the left without significant siphon stenosis. Minimal to mild calcified plaque on the right without siphon stenosis. Normal ophthalmic artery origins. Patent carotid termini. Normal MCA and ACA origins. Anterior communicating artery and bilateral ACA branches  are within normal limits. Right MCA M1 segment and trifurcation are patent without stenosis. Right MCA branches are  within normal limits. Left MCA M1 segment is patent, and a left MCA trifurcation occurs somewhat early and is patent without stenosis. A dominant posterior left M2 branch is occluded 11-12 millimeters distal to the trifurcation as seen on series 10, image 137 and series 11, image 24. The other left M2 branches seem to remain patent. Venous sinuses: Early contrast timing. The superior sagittal sinus, torcula and the dominant right transverse sinuses appear patent. Anatomic variants: Retropharyngeal course of both cervical carotids. Mildly dominant right vertebral. Review of the MIP images confirms the above findings IMPRESSION: 1. 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 This finding was communicated to Dr. Rory Percy at 1027 hours via the [facility] messaging system. 2. CT Perfusion detects 7 mL of infarct core and 53 mL of ischemic penumbra in the corresponding Left MCA territory/operculum. 3. Superimposed 50% stenosis at the Left ICA origin due to calcified plaque. Mild left siphon plaque without stenosis. 4. Mild for age atherosclerosis elsewhere in the head and neck. Tortuous proximal great vessels and incidental retropharyngeal course of both cervical carotids. Electronically Signed   By: Genevie Ann M.D.   On: 07/17/2019 22:36   CT Code Stroke Cerebral Perfusion with contrast  Result Date: 07/17/2019 CLINICAL DATA:  69 year old female code stroke with aphasia and asymmetric left MCA hyperdensity on plain head CT today. EXAM: CT ANGIOGRAPHY HEAD AND NECK CT PERFUSION BRAIN TECHNIQUE: Multidetector CT imaging of the head and neck was performed using the standard protocol during bolus administration of intravenous contrast. Multiplanar CT image reconstructions and MIPs were obtained to evaluate the vascular anatomy. Carotid stenosis measurements (when applicable) are obtained utilizing NASCET criteria, using the distal internal carotid  diameter as the denominator. Multiphase CT imaging of the brain was performed following IV bolus contrast injection. Subsequent parametric perfusion maps were calculated using RAPID software. CONTRAST:  141mL OMNIPAQUE IOHEXOL 350 MG/ML SOLN COMPARISON:  Plain head CT 2157 hours today. FINDINGS: CT Brain Perfusion Findings: ASPECTS: 10 CBF (<30%) Volume: 45mL Perfusion (Tmax>6.0s) volume: 50mL hypoperfusion index of 0.4. Mismatch Volume: 38mL Infarction Location:Left MCA, operculum white matter. Skeleton: Cervical spine degeneration. No acute osseous abnormality identified. Upper chest: Negative. Other neck: Partially retropharyngeal course of both carotids and carotid bifurcations. No acute findings in the neck. Aortic arch: 3 vessel arch configuration with mild to moderate calcified arch atherosclerosis. Tortuous great vessel origins. Right carotid system: Tortuous brachiocephalic artery and proximal right CCA without stenosis. Retropharyngeal right carotid bifurcation with mild cervical right ICA plaque and no stenosis. Left carotid system: Tortuous proximal left CCA without stenosis. Retropharyngeal left carotid bifurcation with bulky calcified plaque at the posterior left ICA origin and bulb resulting in 50 % stenosis with respect to the distal vessel. No additional plaque to the skull base. Vertebral arteries: No proximal right subclavian artery or right vertebral artery origin plaque or stenosis. The right vertebral appears dominant and is patent to the skull base without stenosis. Mild plaque in the proximal left subclavian artery without stenosis. Normal left vertebral artery origin. Tortuous left V1 segment. Minimally non dominant left vertebral artery is patent to the skull base without stenosis. CTA HEAD Posterior circulation: Mild bilateral calcified V4 segment plaque without stenosis. Patent vertebrobasilar junction and basilar artery without stenosis. Patent PICA origins. Ectatic basilar tip. Normal  SCA and PCA origins otherwise.  Posterior communicating arteries are diminutive or absent. Bilateral PCA branches are within normal limits. Anterior circulation: Both ICA siphons are patent. Mild to moderate calcified plaque on the left without significant siphon stenosis. Minimal to mild calcified plaque on the right without siphon stenosis. Normal ophthalmic artery origins. Patent carotid termini. Normal MCA and ACA origins. Anterior communicating artery and bilateral ACA branches are within normal limits. Right MCA M1 segment and trifurcation are patent without stenosis. Right MCA branches are within normal limits. Left MCA M1 segment is patent, and a left MCA trifurcation occurs somewhat early and is patent without stenosis. A dominant posterior left M2 branch is occluded 11-12 millimeters distal to the trifurcation as seen on series 10, image 137 and series 11, image 24. The other left M2 branches seem to remain patent. Venous sinuses: Early contrast timing. The superior sagittal sinus, torcula and the dominant right transverse sinuses appear patent. Anatomic variants: Retropharyngeal course of both cervical carotids. Mildly dominant right vertebral. Review of the MIP images confirms the above findings IMPRESSION: 1. 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 This finding was communicated to Dr. Rory Percy at 1027 hours via the [facility] messaging system. 2. CT Perfusion detects 7 mL of infarct core and 53 mL of ischemic penumbra in the corresponding Left MCA territory/operculum. 3. Superimposed 50% stenosis at the Left ICA origin due to calcified plaque. Mild left siphon plaque without stenosis. 4. Mild for age atherosclerosis elsewhere in the head and neck. Tortuous proximal great vessels and incidental retropharyngeal course of both cervical carotids. Electronically Signed   By: Genevie Ann M.D.   On: 07/17/2019 22:36   CT HEAD CODE  STROKE WO CONTRAST  Result Date: 07/17/2019 CLINICAL DATA:  Code stroke. 69 year old female with aphasia. Last seen normal 2038 hours. EXAM: CT HEAD WITHOUT CONTRAST TECHNIQUE: Contiguous axial images were obtained from the base of the skull through the vertex without intravenous contrast. COMPARISON:  None. FINDINGS: Brain: Cerebral volume is within normal limits for age. No ventriculomegaly. No midline shift, mass effect, or evidence of intracranial mass lesion. No acute intracranial hemorrhage identified. Gray-white matter differentiation appears normal in the right hemisphere, deep gray nuclei and posterior fossa. And no cytotoxic edema is identified in the left MCA territory. Vascular: Calcified atherosclerosis at the skull base. Asymmetric vascular hyperdensity at the left MCA origin and in the sylvian fissure on series 3, images 11 and 12. Skull: No acute osseous abnormality identified. Sinuses/Orbits: Visualized paranasal sinuses and mastoids are clear. Other: No acute orbit or scalp soft tissue findings. ASPECTS [facility] Stroke Program Early CT Score) Total score (0-10 with 10 being normal): 10 IMPRESSION: 1. Positive for asymmetrically hyperdense Left MCA suspicious for emergent large vessel occlusion in this setting, but normal for age non contrast CT appearance of the brain. 2.  ASPECTS 10. 3. These results were communicated to Dr. Rory Percy at 10:01 pm on 07/17/2019 by text page via the [facility] messaging system. Electronically Signed   By: Genevie Ann M.D.   On: 07/17/2019 22:02    Labs:  CBC: Recent Labs    11/23/18 0930 12/01/18 0832 07/17/19 2152 07/17/19 2157 07/18/19 0601  WBC 7.0 6.1 10.4  --  7.2  HGB 12.9 13.5 15.3* 16.0* 12.9  HCT 38.6 40.8 46.8* 47.0* 38.3  PLT 184 355 247  --  211    COAGS: Recent Labs    12/01/18 0832 07/17/19 2152  INR 0.8  0.9  APTT  --  23*    BMP: Recent Labs    10/26/18 0900 11/23/18 0930 07/17/19 2152 07/17/19 2157 07/18/19 0601  NA 142 140  143 142 141  K 3.9 4.1 4.1 3.5 4.1  CL 105 105 109 108 110  CO2 23 23 22   --  23  GLUCOSE 135* 134* 127* 123* 204*  BUN 18 15 21 22 15   CALCIUM 9.6 8.8* 9.2  --  8.3*  CREATININE 0.82 0.78 0.78 0.60 0.71  GFRNONAA >60 >60 >60  --  >60  GFRAA >60 >60 >60  --  >60    LIVER FUNCTION TESTS: Recent Labs    10/03/18 0936 10/26/18 0900 11/23/18 0930 07/17/19 2152  BILITOT 0.5 0.3 0.4 1.2  AST 23 22 22  33  ALT 33 32 31 37  ALKPHOS 108 101 97 80  PROT 7.7 7.2 6.9 6.9  ALBUMIN 4.4 4.0 3.8 4.2    Assessment and Plan:  History of acute CVA s/p cerebral arteriogram with emergent mechanical thrombectomy of left MCA inferior division M2 segment occlusion achieving a TICI 3 revascularization 07/18/2019. Patient's condition improving- can spontaneously move all extremities, speech clear. Right groin incision stable, distal pulses 2+ bilaterally. Further plans per neurology- appreciate and agree with management. Please call NIR with questions/concerns.   Electronically Signed: Earley Abide, PA-C 07/18/2019, 9:45 AM   I spent a total of 25 Minutes at the the patient's bedside AND on the patient's hospital floor or unit, greater than 50% of which was counseling/coordinating care for left MCA inferior division M2 segment occlusion s/p revascularization.

## 2019-07-18 NOTE — Sedation Documentation (Signed)
Right groin sheath removed, 15fr angioseal closure device used.

## 2019-07-18 NOTE — Anesthesia Postprocedure Evaluation (Signed)
Anesthesia Post Note  Patient: Amor Capretta  Procedure(s) Performed: IR WITH ANESTHESIA (N/A )     Patient location during evaluation: PACU Anesthesia Type: General Level of consciousness: awake and alert Pain management: pain level controlled Vital Signs Assessment: post-procedure vital signs reviewed and stable Respiratory status: spontaneous breathing, nonlabored ventilation, respiratory function stable and patient connected to nasal cannula oxygen Cardiovascular status: blood pressure returned to baseline and stable Postop Assessment: no apparent nausea or vomiting Anesthetic complications: no    Last Vitals:  Vitals:   07/18/19 0615 07/18/19 0630  BP: 137/63 136/67  Pulse: (!) 111 97  Resp: (!) 23 13  Temp:    SpO2: 91% 97%    Last Pain:  Vitals:   07/18/19 0400  TempSrc:   PainSc: 0-No pain                 Liam Bossman,W. EDMOND

## 2019-07-18 NOTE — Progress Notes (Signed)
  Echocardiogram 2D Echocardiogram has been performed.  Jordan Martinez 07/18/2019, 11:11 AM

## 2019-07-18 NOTE — Progress Notes (Signed)
Patient ID: Jordan Martinez, female   DOB: Jul 13, 1950, 69 y.o.   MRN: [id number] INR 68 Y RT F MRSS 0 LSW [id number]. New onset of aphasia .  CT brain NO ICH ASPOECTS 10 CTA occluded distal M2 seg of inf division with a core of 65ml and a penumbra of 53 ml. Endovascular treatment d/w patients daughter in a 3 way call Procedure,reasons,riskas and alternatives, reviewed. Risks of ICH of 10 %.worsenimng neuro deficit death,inability to revascularize reviewed. Daughter expressed understanding and provided consent to the treatment, S.Jaina Morin MD.  Post procedure CT brain shows mild Lt peri insular hyperattenation ?small SAH =/_ contrast leak. No Mass effect or shift noted.Marland Kitchen Extubated. Moves all 4s equally.Turns head to name . Difficulty with expression and word output. RT groin puncture site sealed wit an 64F angioseal device. Distal pulses all palpable. S.Bemnet Trovato MD

## 2019-07-18 NOTE — Progress Notes (Signed)
Bilateral lower extremity venous duplex has been completed. Preliminary results can be found in CV Proc through chart review.   07/18/19 10:32 AM Jordan Martinez RVT

## 2019-07-18 NOTE — Sedation Documentation (Signed)
Patient transported to PACU. All of patient belongings taken home by Percell Miller [facility]).

## 2019-07-18 NOTE — Progress Notes (Signed)
D; Pt c/o need use bathroom, unable to void Notified Dr. Rosann Auerbach, got ordered f-cath place@0230  16 fr. f-cath placement without difficulty, 425 cc obtained.

## 2019-07-18 NOTE — Evaluation (Signed)
Physical Therapy Evaluation Patient Details Name: Georgann Escarsega MRN: [id number] DOB: 1951/06/26 Today's Date: 07/18/2019   History of Present Illness   Itzamara Tatro is a 69 y.o. female past medical history of diabetes, endometrial cancer, HNPCC, hyperlipidemia, hypertension-last known normal around 8:30 PM today while having dinner with family and had sudden difficulty talking and unresponsiveness.  EMT's report expressive aphasia and activated code stroke.  tPA given, CT showed L M2 occlusion, pt revascularized successfully in IR.  Clinical Impression  Pt appears at baseline functioning and should be safe at home. There are no further acute PT needs.  Will sign off at this time.     Follow Up Recommendations Other (comment);Outpatient PT(continue with PT [facility] at [facility])    Equipment Recommendations  None recommended by PT    Recommendations for Other Services       Precautions / Restrictions Precautions Precautions: None      Mobility  Bed Mobility Overal bed mobility: Independent                Transfers Overall transfer level: Independent                  Ambulation/Gait Ambulation/Gait assistance: Independent Gait Distance (Feet): 350 Feet Assistive device: None Gait Pattern/deviations: WFL(Within Functional Limits)   Gait velocity interpretation: >2.62 ft/sec, indicative of community ambulatory General Gait Details: pt steady and able to make appropriate changes in gait speed.  Stairs            Wheelchair Mobility    Modified Rankin (Stroke Patients Only) Modified Rankin (Stroke Patients Only) Pre-Morbid Rankin Score: No symptoms Modified Rankin: No symptoms     Balance Overall balance assessment: No apparent balance deficits (not formally assessed);Independent                                           Pertinent Vitals/Pain Pain Assessment: No/denies pain    Home Living Family/patient expects to be  discharged to:: Private residence Living Arrangements: Alone Available Help at Discharge: Friend(s);Available 24 hours/day Type of Home: Independent living facility Home Access: Level entry     Home Layout: One level Home Equipment: None      Prior Function Level of Independence: Independent         Comments: Drives, independent with ADL/IADL     Hand Dominance   Dominant Hand: Right    Extremity/Trunk Assessment   Upper Extremity Assessment Upper Extremity Assessment: Overall WFL for tasks assessed    Lower Extremity Assessment Lower Extremity Assessment: Overall WFL for tasks assessed    Cervical / Trunk Assessment Cervical / Trunk Assessment: Normal  Communication   Communication: No difficulties  Cognition Arousal/Alertness: Awake/alert Behavior During Therapy: WFL for tasks assessed/performed Overall Cognitive Status: Within Functional Limits for tasks assessed                                        General Comments General comments (skin integrity, edema, etc.): generally stable with episodes of tachy that looked like artifact.  Pt was assymptomatic during these episodes.    Exercises     Assessment/Plan    PT Assessment Patent does not need any further PT services  PT Problem List         PT Treatment Interventions  PT Goals (Current goals can be found in the Care Plan section)  Acute Rehab PT Goals Patient Stated Goal: continue therapy at [facility] PT Goal Formulation: All assessment and education complete, DC therapy    Frequency     Barriers to discharge        Co-evaluation               AM-PAC PT "6 Clicks" Mobility  Outcome Measure Help needed turning from your back to your side while in a flat bed without using bedrails?: None Help needed moving from lying on your back to sitting on the side of a flat bed without using bedrails?: None Help needed moving to and from a bed to a chair (including a  wheelchair)?: None Help needed standing up from a chair using your arms (e.g., wheelchair or bedside chair)?: None Help needed to walk in hospital room?: None Help needed climbing 3-5 steps with a railing? : None 6 Click Score: 24    End of Session   Activity Tolerance: Patient tolerated treatment well Patient left: in chair;with call bell/phone within reach Nurse Communication: Mobility status PT Visit Diagnosis: Unsteadiness on feet (R26.81)    Time: [id number] PT Time Calculation (min) (ACUTE ONLY): 27 min   Charges:   PT Evaluation $PT Eval Moderate Complexity: 1 Mod          07/18/2019  Ginger Carne., PT Acute Rehabilitation Services [phone]  (pager) [phone]  (office)  Tessie Fass Jamie Belger 07/18/2019, 2:19 PM

## 2019-07-18 NOTE — Anesthesia Procedure Notes (Signed)
Procedure Name: Intubation Date/Time: 07/17/2019 11:22 PM Performed by: Valetta Fuller, CRNA Pre-anesthesia Checklist: Patient identified, Emergency Drugs available, Suction available and Patient being monitored Patient Re-evaluated:Patient Re-evaluated prior to induction Oxygen Delivery Method: Circle system utilized Preoxygenation: Pre-oxygenation with 100% oxygen Induction Type: IV induction, Rapid sequence and Cricoid Pressure applied Laryngoscope Size: Miller and 2 Grade View: Grade I Tube type: Oral Tube size: 7.5 mm Number of attempts: 1 Airway Equipment and Method: Stylet Placement Confirmation: ETT inserted through vocal cords under direct vision,  CO2 detector and breath sounds checked- equal and bilateral Secured at: 23 cm Tube secured with: Tape Dental Injury: Teeth and Oropharynx as per pre-operative assessment

## 2019-07-18 NOTE — Progress Notes (Signed)
The chaplain was consulted for the patient to receive communion.  The chaplain visited and prayed with the patient while discussing options to fulfill the request for communion.  The chaplain will continue to seek a way to get the patient communion.  Brion Aliment Chaplain Resident For questions concerning this note please contact me by pager [phone]

## 2019-07-18 NOTE — Progress Notes (Signed)
STROKE TEAM PROGRESS NOTE   INTERVAL HISTORY Pt sitting in bed, fluent speech now. Sheath off and she passed bedside swallow. She still has mild tachycardia, will resume home atenolol. MRI and MRA pending.   Vitals:   07/18/19 0630 07/18/19 0700 07/18/19 0730 07/18/19 0800  BP: 136/67 136/65  134/66  Pulse: 97 (!) 104 (!) 107 (!) 110  Resp: 13 15 16  (!) 22  Temp:      TempSrc:      SpO2: 97% 92% 99% 92%  Weight:      Height:        CBC:  Recent Labs  Lab 07/17/19 2152 07/17/19 2157 07/18/19 0601  WBC 10.4  --  7.2  NEUTROABS 5.1  --  6.1  HGB 15.3* 16.0* 12.9  HCT 46.8* 47.0* 38.3  MCV 102.0*  --  99.0  PLT 247  --  123456    Basic Metabolic Panel:  Recent Labs  Lab 07/17/19 2152 07/17/19 2157 07/18/19 0601  NA 143 142 141  K 4.1 3.5 4.1  CL 109 108 110  CO2 22  --  23  GLUCOSE 127* 123* 204*  BUN 21 22 15   CREATININE 0.78 0.60 0.71  CALCIUM 9.2  --  8.3*   Lipid Panel:     Component Value Date/Time   CHOL 224 (H) 07/18/2019 0601   TRIG 91 07/18/2019 0601   HDL 86 07/18/2019 0601   CHOLHDL 2.6 07/18/2019 0601   VLDL 18 07/18/2019 0601   LDLCALC 120 (H) 07/18/2019 0601   HgbA1c:  Lab Results  Component Value Date   HGBA1C 7.0 (H) 07/18/2019   Urine Drug Screen:     Component Value Date/Time   LABOPIA NONE DETECTED 07/18/2019 0437   COCAINSCRNUR NONE DETECTED 07/18/2019 0437   LABBENZ NONE DETECTED 07/18/2019 0437   AMPHETMU NONE DETECTED 07/18/2019 0437   THCU NONE DETECTED 07/18/2019 0437   LABBARB NONE DETECTED 07/18/2019 0437    Alcohol Level     Component Value Date/Time   ETH <10 07/17/2019 2152    IMAGING past 48 hours CT Code Stroke CTA Head W/WO contrast  Result Date: 07/17/2019 CLINICAL DATA:  69 year old female code stroke with aphasia and asymmetric left MCA hyperdensity on plain head CT today. EXAM: CT ANGIOGRAPHY HEAD AND NECK CT PERFUSION BRAIN TECHNIQUE: Multidetector CT imaging of the head and neck was performed using the  standard protocol during bolus administration of intravenous contrast. Multiplanar CT image reconstructions and MIPs were obtained to evaluate the vascular anatomy. Carotid stenosis measurements (when applicable) are obtained utilizing NASCET criteria, using the distal internal carotid diameter as the denominator. Multiphase CT imaging of the brain was performed following IV bolus contrast injection. Subsequent parametric perfusion maps were calculated using RAPID software. CONTRAST:  168mL OMNIPAQUE IOHEXOL 350 MG/ML SOLN COMPARISON:  Plain head CT 2157 hours today. FINDINGS: CT Brain Perfusion Findings: ASPECTS: 10 CBF (<30%) Volume: 35mL Perfusion (Tmax>6.0s) volume: 61mL hypoperfusion index of 0.4. Mismatch Volume: 59mL Infarction Location:Left MCA, operculum white matter. Skeleton: Cervical spine degeneration. No acute osseous abnormality identified. Upper chest: Negative. Other neck: Partially retropharyngeal course of both carotids and carotid bifurcations. No acute findings in the neck. Aortic arch: 3 vessel arch configuration with mild to moderate calcified arch atherosclerosis. Tortuous great vessel origins. Right carotid system: Tortuous brachiocephalic artery and proximal right CCA without stenosis. Retropharyngeal right carotid bifurcation with mild cervical right ICA plaque and no stenosis. Left carotid system: Tortuous proximal left CCA without stenosis. Retropharyngeal left carotid  bifurcation with bulky calcified plaque at the posterior left ICA origin and bulb resulting in 50 % stenosis with respect to the distal vessel. No additional plaque to the skull base. Vertebral arteries: No proximal right subclavian artery or right vertebral artery origin plaque or stenosis. The right vertebral appears dominant and is patent to the skull base without stenosis. Mild plaque in the proximal left subclavian artery without stenosis. Normal left vertebral artery origin. Tortuous left V1 segment. Minimally non  dominant left vertebral artery is patent to the skull base without stenosis. CTA HEAD Posterior circulation: Mild bilateral calcified V4 segment plaque without stenosis. Patent vertebrobasilar junction and basilar artery without stenosis. Patent PICA origins. Ectatic basilar tip. Normal SCA and PCA origins otherwise. Posterior communicating arteries are diminutive or absent. Bilateral PCA branches are within normal limits. Anterior circulation: Both ICA siphons are patent. Mild to moderate calcified plaque on the left without significant siphon stenosis. Minimal to mild calcified plaque on the right without siphon stenosis. Normal ophthalmic artery origins. Patent carotid termini. Normal MCA and ACA origins. Anterior communicating artery and bilateral ACA branches are within normal limits. Right MCA M1 segment and trifurcation are patent without stenosis. Right MCA branches are within normal limits. Left MCA M1 segment is patent, and a left MCA trifurcation occurs somewhat early and is patent without stenosis. A dominant posterior left M2 branch is occluded 11-12 millimeters distal to the trifurcation as seen on series 10, image 137 and series 11, image 24. The other left M2 branches seem to remain patent. Venous sinuses: Early contrast timing. The superior sagittal sinus, torcula and the dominant right transverse sinuses appear patent. Anatomic variants: Retropharyngeal course of both cervical carotids. Mildly dominant right vertebral. Review of the MIP images confirms the above findings IMPRESSION: 1. 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 This finding was communicated to Dr. Rory Percy at 1027 hours via the [facility] messaging system. 2. CT Perfusion detects 7 mL of infarct core and 53 mL of ischemic penumbra in the corresponding Left MCA territory/operculum. 3. Superimposed 50% stenosis at the Left ICA origin due to calcified plaque.  Mild left siphon plaque without stenosis. 4. Mild for age atherosclerosis elsewhere in the head and neck. Tortuous proximal great vessels and incidental retropharyngeal course of both cervical carotids. Electronically Signed   By: Genevie Ann M.D.   On: 07/17/2019 22:36   CT HEAD WO CONTRAST  Result Date: 07/18/2019 CLINICAL DATA:  Stroke follow-up. EXAM: CT HEAD WITHOUT CONTRAST TECHNIQUE: Contiguous axial images were obtained from the base of the skull through the vertex without intravenous contrast. COMPARISON:  Limited head CT catheter angiography earlier today. FINDINGS: Brain: No detectable infarct. Small volume contrast/blood product extravasation along the left sylvian fissure is unchanged from intraoperative CT. No hydrocephalus or shift. Vascular: Negative Skull: Negative Sinuses/Orbits: Negative IMPRESSION: 1. Stable small volume subarachnoid contrast/blood along the left sylvian fissure. 2. No detectable infarct. Electronically Signed   By: Monte Fantasia M.D.   On: 07/18/2019 05:13   CT Code Stroke CTA Neck W/WO contrast  Result Date: 07/17/2019 CLINICAL DATA:  69 year old female code stroke with aphasia and asymmetric left MCA hyperdensity on plain head CT today. EXAM: CT ANGIOGRAPHY HEAD AND NECK CT PERFUSION BRAIN TECHNIQUE: Multidetector CT imaging of the head and neck was performed using the standard protocol during bolus administration of intravenous contrast. Multiplanar CT image reconstructions and MIPs were obtained to evaluate the vascular  anatomy. Carotid stenosis measurements (when applicable) are obtained utilizing NASCET criteria, using the distal internal carotid diameter as the denominator. Multiphase CT imaging of the brain was performed following IV bolus contrast injection. Subsequent parametric perfusion maps were calculated using RAPID software. CONTRAST:  160mL OMNIPAQUE IOHEXOL 350 MG/ML SOLN COMPARISON:  Plain head CT 2157 hours today. FINDINGS: CT Brain Perfusion Findings:  ASPECTS: 10 CBF (<30%) Volume: 74mL Perfusion (Tmax>6.0s) volume: 51mL hypoperfusion index of 0.4. Mismatch Volume: 77mL Infarction Location:Left MCA, operculum white matter. Skeleton: Cervical spine degeneration. No acute osseous abnormality identified. Upper chest: Negative. Other neck: Partially retropharyngeal course of both carotids and carotid bifurcations. No acute findings in the neck. Aortic arch: 3 vessel arch configuration with mild to moderate calcified arch atherosclerosis. Tortuous great vessel origins. Right carotid system: Tortuous brachiocephalic artery and proximal right CCA without stenosis. Retropharyngeal right carotid bifurcation with mild cervical right ICA plaque and no stenosis. Left carotid system: Tortuous proximal left CCA without stenosis. Retropharyngeal left carotid bifurcation with bulky calcified plaque at the posterior left ICA origin and bulb resulting in 50 % stenosis with respect to the distal vessel. No additional plaque to the skull base. Vertebral arteries: No proximal right subclavian artery or right vertebral artery origin plaque or stenosis. The right vertebral appears dominant and is patent to the skull base without stenosis. Mild plaque in the proximal left subclavian artery without stenosis. Normal left vertebral artery origin. Tortuous left V1 segment. Minimally non dominant left vertebral artery is patent to the skull base without stenosis. CTA HEAD Posterior circulation: Mild bilateral calcified V4 segment plaque without stenosis. Patent vertebrobasilar junction and basilar artery without stenosis. Patent PICA origins. Ectatic basilar tip. Normal SCA and PCA origins otherwise. Posterior communicating arteries are diminutive or absent. Bilateral PCA branches are within normal limits. Anterior circulation: Both ICA siphons are patent. Mild to moderate calcified plaque on the left without significant siphon stenosis. Minimal to mild calcified plaque on the right without  siphon stenosis. Normal ophthalmic artery origins. Patent carotid termini. Normal MCA and ACA origins. Anterior communicating artery and bilateral ACA branches are within normal limits. Right MCA M1 segment and trifurcation are patent without stenosis. Right MCA branches are within normal limits. Left MCA M1 segment is patent, and a left MCA trifurcation occurs somewhat early and is patent without stenosis. A dominant posterior left M2 branch is occluded 11-12 millimeters distal to the trifurcation as seen on series 10, image 137 and series 11, image 24. The other left M2 branches seem to remain patent. Venous sinuses: Early contrast timing. The superior sagittal sinus, torcula and the dominant right transverse sinuses appear patent. Anatomic variants: Retropharyngeal course of both cervical carotids. Mildly dominant right vertebral. Review of the MIP images confirms the above findings IMPRESSION: 1. 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 This finding was communicated to Dr. Rory Percy at 1027 hours via the [facility] messaging system. 2. CT Perfusion detects 7 mL of infarct core and 53 mL of ischemic penumbra in the corresponding Left MCA territory/operculum. 3. Superimposed 50% stenosis at the Left ICA origin due to calcified plaque. Mild left siphon plaque without stenosis. 4. Mild for age atherosclerosis elsewhere in the head and neck. Tortuous proximal great vessels and incidental retropharyngeal course of both cervical carotids. Electronically Signed   By: Genevie Ann M.D.   On: 07/17/2019 22:36   CT Code Stroke Cerebral Perfusion with contrast  Result Date:  07/17/2019 CLINICAL DATA:  69 year old female code stroke with aphasia and asymmetric left MCA hyperdensity on plain head CT today. EXAM: CT ANGIOGRAPHY HEAD AND NECK CT PERFUSION BRAIN TECHNIQUE: Multidetector CT imaging of the head and neck was performed using the standard  protocol during bolus administration of intravenous contrast. Multiplanar CT image reconstructions and MIPs were obtained to evaluate the vascular anatomy. Carotid stenosis measurements (when applicable) are obtained utilizing NASCET criteria, using the distal internal carotid diameter as the denominator. Multiphase CT imaging of the brain was performed following IV bolus contrast injection. Subsequent parametric perfusion maps were calculated using RAPID software. CONTRAST:  141mL OMNIPAQUE IOHEXOL 350 MG/ML SOLN COMPARISON:  Plain head CT 2157 hours today. FINDINGS: CT Brain Perfusion Findings: ASPECTS: 10 CBF (<30%) Volume: 76mL Perfusion (Tmax>6.0s) volume: 74mL hypoperfusion index of 0.4. Mismatch Volume: 80mL Infarction Location:Left MCA, operculum white matter. Skeleton: Cervical spine degeneration. No acute osseous abnormality identified. Upper chest: Negative. Other neck: Partially retropharyngeal course of both carotids and carotid bifurcations. No acute findings in the neck. Aortic arch: 3 vessel arch configuration with mild to moderate calcified arch atherosclerosis. Tortuous great vessel origins. Right carotid system: Tortuous brachiocephalic artery and proximal right CCA without stenosis. Retropharyngeal right carotid bifurcation with mild cervical right ICA plaque and no stenosis. Left carotid system: Tortuous proximal left CCA without stenosis. Retropharyngeal left carotid bifurcation with bulky calcified plaque at the posterior left ICA origin and bulb resulting in 50 % stenosis with respect to the distal vessel. No additional plaque to the skull base. Vertebral arteries: No proximal right subclavian artery or right vertebral artery origin plaque or stenosis. The right vertebral appears dominant and is patent to the skull base without stenosis. Mild plaque in the proximal left subclavian artery without stenosis. Normal left vertebral artery origin. Tortuous left V1 segment. Minimally non dominant  left vertebral artery is patent to the skull base without stenosis. CTA HEAD Posterior circulation: Mild bilateral calcified V4 segment plaque without stenosis. Patent vertebrobasilar junction and basilar artery without stenosis. Patent PICA origins. Ectatic basilar tip. Normal SCA and PCA origins otherwise. Posterior communicating arteries are diminutive or absent. Bilateral PCA branches are within normal limits. Anterior circulation: Both ICA siphons are patent. Mild to moderate calcified plaque on the left without significant siphon stenosis. Minimal to mild calcified plaque on the right without siphon stenosis. Normal ophthalmic artery origins. Patent carotid termini. Normal MCA and ACA origins. Anterior communicating artery and bilateral ACA branches are within normal limits. Right MCA M1 segment and trifurcation are patent without stenosis. Right MCA branches are within normal limits. Left MCA M1 segment is patent, and a left MCA trifurcation occurs somewhat early and is patent without stenosis. A dominant posterior left M2 branch is occluded 11-12 millimeters distal to the trifurcation as seen on series 10, image 137 and series 11, image 24. The other left M2 branches seem to remain patent. Venous sinuses: Early contrast timing. The superior sagittal sinus, torcula and the dominant right transverse sinuses appear patent. Anatomic variants: Retropharyngeal course of both cervical carotids. Mildly dominant right vertebral. Review of the MIP images confirms the above findings IMPRESSION: 1. 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 This finding was communicated to Dr. Rory Percy at 1027 hours via the [facility] messaging system. 2. CT Perfusion detects 7 mL of infarct core and 53 mL of ischemic penumbra in the corresponding Left MCA territory/operculum. 3. Superimposed 50% stenosis at the  Left ICA origin due to calcified plaque. Mild  left siphon plaque without stenosis. 4. Mild for age atherosclerosis elsewhere in the head and neck. Tortuous proximal great vessels and incidental retropharyngeal course of both cervical carotids. Electronically Signed   By: Genevie Ann M.D.   On: 07/17/2019 22:36   CT HEAD CODE STROKE WO CONTRAST  Result Date: 07/17/2019 CLINICAL DATA:  Code stroke. 69 year old female with aphasia. Last seen normal 2038 hours. EXAM: CT HEAD WITHOUT CONTRAST TECHNIQUE: Contiguous axial images were obtained from the base of the skull through the vertex without intravenous contrast. COMPARISON:  None. FINDINGS: Brain: Cerebral volume is within normal limits for age. No ventriculomegaly. No midline shift, mass effect, or evidence of intracranial mass lesion. No acute intracranial hemorrhage identified. Gray-white matter differentiation appears normal in the right hemisphere, deep gray nuclei and posterior fossa. And no cytotoxic edema is identified in the left MCA territory. Vascular: Calcified atherosclerosis at the skull base. Asymmetric vascular hyperdensity at the left MCA origin and in the sylvian fissure on series 3, images 11 and 12. Skull: No acute osseous abnormality identified. Sinuses/Orbits: Visualized paranasal sinuses and mastoids are clear. Other: No acute orbit or scalp soft tissue findings. ASPECTS [facility] Stroke Program Early CT Score) Total score (0-10 with 10 being normal): 10 IMPRESSION: 1. Positive for asymmetrically hyperdense Left MCA suspicious for emergent large vessel occlusion in this setting, but normal for age non contrast CT appearance of the brain. 2.  ASPECTS 10. 3. These results were communicated to Dr. Rory Percy at 10:01 pm on 07/17/2019 by text page via the [facility] messaging system. Electronically Signed   By: Genevie Ann M.D.   On: 07/17/2019 22:02    PHYSICAL EXAM  Temp:  [97.4 F (36.3 C)-98.8 F (37.1 C)] 98.8 F (37.1 C) (01/12 0800) Pulse Rate:  [94-116] 110 (01/12 0800) Resp:  [13-23] 22  (01/12 0800) BP: (110-187)/(49-148) 134/66 (01/12 0800) SpO2:  [91 %-100 %] 92 % (01/12 0800) Weight:  [91.9 kg-93.4 kg] 91.9 kg (01/12 0400)  General - Well nourished, well developed, in no apparent distress.  Ophthalmologic - fundi not visualized due to noncooperation.  Cardiovascular - Regular rhythm and mild tachycardia at 100s.  Mental Status -  Level of arousal and orientation to time, place, and person were intact. Language including expression, naming, repetition, comprehension was assessed and found intact. Fund of Knowledge was assessed and was intact.  Cranial Nerves II - XII - II - Visual field intact OU. III, IV, VI - Extraocular movements intact. V - Facial sensation intact bilaterally. VII - Facial movement intact bilaterally. VIII - Hearing & vestibular intact bilaterally. X - Palate elevates symmetrically. XI - Chin turning & shoulder shrug intact bilaterally. XII - Tongue protrusion intact.  Motor Strength - The patient's strength was normal in all extremities and pronator drift was absent.  Bulk was normal and fasciculations were absent.   Motor Tone - Muscle tone was assessed at the neck and appendages and was normal.  Reflexes - The patient's reflexes were symmetrical in all extremities and she had no pathological reflexes  Sensory - Light touch, temperature/pinprick were assessed and were symmetrical.    Coordination - The patient had normal movements in the hands with no ataxia or dysmetria.  Tremor was absent.  Gait and Station - deferred.   ASSESSMENT/PLAN [name] is a 69 y.o. female with history of diabetes, endometrial cancer, HNPCC, hyperlipidemia, hypertension presenting with aphasia. Received tPA 07/17/2019 at 2207. Sent to IR  for a L M2 occlusion.   Stroke: L MCA infarct due to left M2 occlusion s/p tPA and IR with small SAH left sylvian fissure, infarct embolic secondary to unknown source  Code Stroke CT head hyperdense L MCA.  ASPECTS 10.     CTA head & neck L M2 MCA ELVO. L ICA 50% stenosis, mild L siphon plaque. Mild atherosclerosis throughout  CT perfusion 7 mL core, 53 mL penumbra L MCA territory   Cerebral angio TICI3 revascularization distal L M2   F/u CT mild SAH L sylvian fissure. No infarct   MRI and MRA pending   2D Echo pending  LE venous doppler pending  Will consider loop recorder  Placement if work up above neg.  LDL 120  HgbA1c 7.0  SCDs for VTE prophylaxis  aspirin 81 mg daily prior to admission, now on No antithrombotic as within 24h of tPA administration and SAH on CT  Therapy recommendations:  pending   Disposition:  pending   Allergic reaction to TPA  Blister/welps at administration site  Treated w/ benadryl  Hypertension  Home meds:  Lisinopril 10, atenolol 50  Stable  Resume home BP meds . BP goal 120-140 as within 24h following IR procedure and CT showing small SAH  . Long-term BP goal normotensive  Hyperlipidemia  Home meds:  lipitor 20 and fish oil  Increase lipitor to 40  LDL 120, goal < 70  Continue statin at discharge  Diabetes type II Controlled  Home meds:  glucophage 500 bid  HgbA1c 7.0, goal < 7.0  CBG monitoring   SSI  PCP follow up  Endometrial cancer   S/p surgery and chemo  Currently no mets  Follow up with oncology regularly  Less likely to be hypercoagulable state at this time from cancer   B/l Knee pain  S/p bilateral knee replacement two years ago in [place] with Dr. Tommi Rumps at [facility] s/p bilateral knee ablation last month  Associated with right hip pain pending hip injection  Denies immobility  LE venous doppler pending  Tobacco abuse  Current smoker  Smoking cessation counseling provided  Pt is willing to quit  Other Stroke Risk Factors  Advanced age  ETOH use, alcohol level <10, advised to drink no more than 1 drink(s) a day  Obesity, Body mass index is 34.78 kg/m., recommend weight loss, diet  and exercise as appropriate   Other Active Problems  GERD on PPI  Hospital day # 1  This patient is critically ill due to stroke s/p tPA and thrombectomy, SAH and at significant risk of neurological worsening, death form recurrent stroke, SAH enlargement, hemorrhagic conversion, heart failure. This patient's care requires constant monitoring of vital signs, hemodynamics, respiratory and cardiac monitoring, review of multiple databases, neurological assessment, discussion with family, other specialists and medical decision making of high complexity. I spent 40 minutes of neurocritical care time in the care of this patient. I had long discussion with daughter over the phone, updated pt current condition, treatment plan and potential prognosis, and answered all the questions. She expressed understanding and appreciation.   Rosalin Hawking, MD PhD Stroke Neurology 07/18/2019 10:11 AM   To contact Stroke Continuity provider, please refer to [email or web address] After hours, contact General Neurology

## 2019-07-18 NOTE — Transfer of Care (Signed)
Immediate Anesthesia Transfer of Care Note  Patient: Jordan Martinez  Procedure(s) Performed: IR WITH ANESTHESIA (N/A )  Patient Location: PACU  Anesthesia Type:General  Level of Consciousness: awake and patient cooperative  Airway & Oxygen Therapy: Patient connected to face mask oxygen  Post-op Assessment: Report given to RN and Post -op Vital signs reviewed and stable  Post vital signs: Reviewed and stable  Last Vitals:  Vitals Value Taken Time  BP 117/66 07/18/19 0125  Temp    Pulse 95 07/18/19 0132  Resp 17 07/18/19 0132  SpO2 100 % 07/18/19 0132  Vitals shown include unvalidated device data.  Last Pain:  Vitals:   07/17/19 2200  PainSc: 0-No pain         Complications: No apparent anesthesia complications

## 2019-07-19 ENCOUNTER — Encounter (HOSPITAL_COMMUNITY)
Admission: EM | Disposition: A | Discharge status: Home / Self Care | Payer: Self-pay | Source: Home / Self Care | Attending: Neurology

## 2019-07-19 DIAGNOSIS — I6389 Other cerebral infarction: Secondary | ICD-10-CM

## 2019-07-19 DIAGNOSIS — F17200 Nicotine dependence, unspecified, uncomplicated: Secondary | ICD-10-CM

## 2019-07-19 DIAGNOSIS — E7800 Pure hypercholesterolemia, unspecified: Secondary | ICD-10-CM

## 2019-07-19 HISTORY — PX: LOOP RECORDER INSERTION: EP1214

## 2019-07-19 LAB — GLUCOSE, CAPILLARY
Glucose-Capillary: 134 mg/dL — ABNORMAL HIGH (ref 70–99)
Glucose-Capillary: 143 mg/dL — ABNORMAL HIGH (ref 70–99)
Glucose-Capillary: 110 mg/dL — ABNORMAL HIGH (ref 70–99)

## 2019-07-19 SURGERY — LOOP RECORDER INSERTION

## 2019-07-19 MED ORDER — LIDOCAINE-EPINEPHRINE 1 %-1:100000 IJ SOLN
INTRAMUSCULAR | Status: AC
  Filled 2019-07-19: qty 1

## 2019-07-19 MED ORDER — CLOPIDOGREL BISULFATE 75 MG PO TABS: 75.0000 mg | ORAL_TABLET | Freq: Every day | ORAL | Status: DC

## 2019-07-19 MED ORDER — CLOPIDOGREL BISULFATE 75 MG PO TABS: 75.0000 mg | ORAL_TABLET | Freq: Every day | ORAL | 0 refills | Status: AC

## 2019-07-19 MED ORDER — ASPIRIN EC 81 MG PO TBEC: 81.0000 mg | DELAYED_RELEASE_TABLET | Freq: Every day | ORAL | Status: DC

## 2019-07-19 MED ORDER — ATORVASTATIN CALCIUM 40 MG PO TABS: 40.0000 mg | ORAL_TABLET | Freq: Every day | ORAL | 2 refills | Status: DC

## 2019-07-19 SURGICAL SUPPLY — 2 items
MONITOR REVEAL LINQ II (Prosthesis & Implant Heart) ×3 IMPLANT
PACK LOOP INSERTION (CUSTOM PROCEDURE TRAY) ×3 IMPLANT

## 2019-07-19 NOTE — Consult Note (Addendum)
ELECTROPHYSIOLOGY CONSULT NOTE  Patient ID: Jordan Martinez MRN: [id number], DOB/AGE: 1950/09/02   Admit date: 07/17/2019 Date of Consult: 07/19/2019  Primary Physician: Javier Glazier, MD Primary Cardiologist: none Reason for Consultation: Cryptogenic stroke - recommendations regarding Implantable Loop Recorder, requested by Dr. Erlinda Hong  History of Present Illness Jordan Martinez was admitted on 07/17/2019 with sudden onset of difficult speech, AMS, found with stroke.  They first developed symptoms while eating dinner.   PMHx includes DM, HTN, HLD, endometrial cancer  Neurology noted L MCA infarct due to left M2 occlusion s/p tPA and IR with small SAH left sylvian fissure, infarct embolic secondary to unknown source.  she has undergone workup for stroke including echocardiogram and carotid angio.  The patient has been monitored on telemetry which has demonstrated sinus rhythm with no arrhythmias.  Neurology has deferred TEE   Echocardiogram this admission demonstrated    IMPRESSIONS 1. Left ventricular ejection fraction, by visual estimation, is 65 to 70%. The left ventricle has hyperdynamic function. There is mildly increased left ventricular hypertrophy.  2. Left ventricular diastolic parameters are consistent with Grade I diastolic dysfunction (impaired relaxation).  3. The left ventricle has no regional wall motion abnormalities.  4. Global right ventricle has hyperdynamic systolic function.The right ventricular size is normal. No increase in right ventricular wall thickness.  5. Left atrial size was normal.  6. Right atrial size was normal.  7. Mild mitral annular calcification.  8. The mitral valve is normal in structure. No evidence of mitral valve regurgitation. No evidence of mitral stenosis.  9. The tricuspid valve is normal in structure. 10. The aortic valve is normal in structure. Aortic valve regurgitation is not visualized. Mild aortic valve sclerosis without stenosis. 11. The  pulmonic valve was normal in structure. Pulmonic valve regurgitation is not visualized. 12. The inferior vena cava is normal in size with greater than 50% respiratory variability, suggesting right atrial pressure of 3 mmHg.    Lab work is reviewed.   Prior to admission, the patient denies chest pain, shortness of breath, dizziness, or syncope.  She does mention a few/far between brief fluttering in her chest ever since she started menopause.  Mentions she still has daily hot flashes, though the flutter is very infrequent, avery few months or more, and has been attributed to her menopause (ongoing now for 14 years) She is recovering from their stroke with plans to return home (Independent living at [facility])  at discharge.    Past Medical History:  Diagnosis Date   Anxiety    Arthritis    Cataract    bilatereral cat. ext. with lens implant   Depression    Diabetes mellitus without complication ([place])    diet controlled. States always has borderline HgbA1C   Endometrial cancer ([place])    Family history of colon cancer    Family history of genetic mutation for hereditary nonpolyposis colorectal cancer ([facility])    GERD (gastroesophageal reflux disease)    High cholesterol    History of kidney stones    years    Hypertension    Neuropathy    Sensation of pressure in bladder area    Stroke ([place]) 07/17/2019   Uterine cramping      Surgical History:  Past Surgical History:  Procedure Laterality Date   burnt the nerves  Right 04/29/2018   in right knee    burnt the nerves  Left 04/22/2018   left knee nerve    cystoscopy kidney stone  1985   IR IMAGING GUIDED PORT INSERTION  06/15/2018   IR PERCUTANEOUS ART THROMBECTOMY/INFUSION INTRACRANIAL INC DIAG ANGIO  07/18/2019   IR REMOVAL TUN ACCESS W/ PORT W/O FL MOD SED  12/01/2018   RADIOLOGY WITH ANESTHESIA N/A 07/17/2019   Procedure: IR WITH ANESTHESIA;  Surgeon: Luanne Bras, MD;  Location: [facility];  Service: Radiology;   Laterality: N/A;   REPLACEMENT TOTAL KNEE BILATERAL Bilateral 2018   Right x 2. 2019 undergoing intermittent injection "RFA" procedures with Dr. Sheliah Mends [facility]   REVISION TOTAL KNEE ARTHROPLASTY Right 03/2017   ROBOTIC ASSISTED TOTAL HYSTERECTOMY WITH BILATERAL SALPINGO OOPHERECTOMY N/A 05/03/2018   Procedure: XI ROBOTIC ASSISTED TOTAL HYSTERECTOMY WITH BILATERAL SALPINGO OOPHORECTOMY;  Surgeon: Everitt Amber, MD;  Location: [facility];  Service: Gynecology;  Laterality: N/A;   SENTINEL NODE BIOPSY N/A 05/03/2018   Procedure: SENTINEL NODE BIOPSY;  Surgeon: Everitt Amber, MD;  Location: [facility];  Service: Gynecology;  Laterality: N/A;   TONSILLECTOMY AND ADENOIDECTOMY     TUBAL LIGATION       Medications Prior to Admission  Medication Sig Dispense Refill Last Dose   acetaminophen (TYLENOL) 500 MG tablet Take 1,000 mg by mouth every 6 (six) hours as needed for mild pain or moderate pain.   07/17/2019 at Unknown time   amoxicillin (AMOXIL) 500 MG capsule Take 2,000 mg by mouth once as needed (dental work).   Past Week at Unknown time   aspirin EC 81 MG tablet Take 81 mg by mouth daily.   Past Month at Unknown time   atenolol (TENORMIN) 50 MG tablet Take 50 mg by mouth daily.   2200   atorvastatin (LIPITOR) 20 MG tablet Take 20 mg by mouth at bedtime.   0 07/16/2019   Biotin 5000 MCG TABS Take 5,000 mcg by mouth daily.   Past Month at Unknown time   celecoxib (CELEBREX) 100 MG capsule Take 200 mg by mouth 2 (two) times daily.    Past Month at Unknown time   cholecalciferol (VITAMIN D) 400 units TABS tablet Take 400 Units by mouth daily.   Past Month at Unknown time   cyclobenzaprine (FLEXERIL) 5 MG tablet Take 1 tablet (5 mg total) by mouth 3 (three) times daily. (Patient taking differently: Take 5-10 mg by mouth at bedtime as needed for muscle spasms. ) 90 tablet 1 07/16/2019   lisinopril (ZESTRIL) 10 MG tablet Take 10 mg by mouth daily.   07/17/2019 at Unknown time   metFORMIN (GLUCOPHAGE) 500 MG  tablet TAKE 1 TABLET BY MOUTH TWICE A DAY WITH MEALS 60 tablet 1 07/17/2019 at Unknown time   Omega-3 Fatty Acids (FISH OIL) 1200 MG CAPS Take 1,200 mg by mouth daily.    Past Month at Unknown time   omeprazole (PRILOSEC) 40 MG capsule Take 1 capsule (40 mg total) by mouth daily. 90 capsule 3 07/17/2019 at Unknown time   Propylene Glycol (SYSTANE BALANCE) 0.6 % SOLN Place 2 drops into both eyes 2 (two) times daily.    07/17/2019 at Unknown time   saccharomyces boulardii (FLORASTOR) 250 MG capsule Take 250 mg by mouth 2 (two) times daily as needed (for one week following dental procedure).   Past Week at Unknown time   traMADol (ULTRAM) 50 MG tablet Take 50-100 mg by mouth at bedtime as needed for pain.   07/16/2019   acetaminophen (TYLENOL) 325 MG tablet Take 2 tablets (650 mg total) by mouth every 6 (six) hours as needed for mild pain, fever  or headache (or Fever >/= 101). (Patient not taking: Reported on 07/18/2019) 15 tablet 0 Not Taking at Unknown time    Inpatient Medications:   atenolol  50 mg Oral Daily   atorvastatin  40 mg Oral QHS   cholecalciferol  500 Units Oral Daily   diphenhydrAMINE  25 mg Intravenous Once   insulin aspart  0-9 Units Subcutaneous TID WC   lisinopril  10 mg Oral Daily   pantoprazole  40 mg Oral Daily   traZODone  100 mg Oral QHS    Allergies:  Allergies  Allergen Reactions   Amlodipine     Blurred vision    Compazine [Prochlorperazine Edisylate]     Stroke symptoms,   Gabapentin     Blurred vision and nystagmus   Lyrica [Pregabalin]     Blurred Vision    Sertraline Other (See Comments)    Visual disturbances.    Chlorhexidine Rash    Blisters and redness to chest    Social History   Socioeconomic History   Marital status: Divorced    Spouse name: Not on file   Number of children: 2   Years of education: Not on file   Highest education level: Not on file  Occupational History   Occupation: retired Marine scientist  Tobacco Use   Smoking status: Current  Every Day Smoker    Packs/day: 0.50    Years: 40.00    Pack years: 20.00    Types: Cigarettes   Smokeless tobacco: Never Used   Tobacco comment: 4-5 loose cigarettes per day , over 20 year hx of smoking   Substance and Sexual Activity   Alcohol use: Yes    Comment: 2 glasses wine daily.    Drug use: Never   Sexual activity: Not Currently    Birth control/protection: None  Other Topics Concern   Not on file  Social History Narrative   Not on file   Social Determinants of Health   Financial Resource Strain:    Difficulty of Paying Living Expenses: Not on file  Food Insecurity:    Worried About [place] in the Last Year: Not on file   Ran Out of Food in the Last Year: Not on file  Transportation Needs:    Lack of Transportation (Medical): Not on file   Lack of Transportation (Non-Medical): Not on file  Physical Activity:    Days of Exercise per Week: Not on file   Minutes of Exercise per Session: Not on file  Stress:    Feeling of Stress : Not on file  Social Connections:    Frequency of Communication with Friends and Family: Not on file   Frequency of Social Gatherings with Friends and Family: Not on file   Attends Religious Services: Not on file   Active Member of Clubs or Organizations: Not on file   Attends Archivist Meetings: Not on file   Marital Status: Not on file  Intimate Partner Violence:    Fear of Current or Ex-Partner: Not on file   Emotionally Abused: Not on file   Physically Abused: Not on file   Sexually Abused: Not on file     Family History  Adopted: Yes  Problem Relation Age of Onset   Colon cancer Daughter 31       Lynch syndrome - MSH6 +   Healthy Son       Review of Systems: All other systems reviewed and are otherwise negative except as noted above.  Physical Exam: Vitals:   07/19/19 0500 07/19/19 0821 07/19/19 1129 07/19/19 1132  BP:  (!) 144/80 (!) 110/58 (!) 108/58  Pulse:  94 74 77  Resp: _0 Temp:   98.2 F (36.8 C) 98.2 F (36.8 C)   TempSrc:  Oral Oral   SpO2:  93% 95% 95%  Weight:      Height:        GEN- The patient is well appearing, alert and oriented x 3 today.   Head- normocephalic, atraumatic Eyes-  Sclera clear, conjunctiva pink Ears- hearing intact Oropharynx- clear Neck- supple Lungs- CTA b/l, normal work of breathing Heart- CTA b/l, no murmurs, rubs or gallops  GI- soft, NT, ND Extremities- no clubbing, cyanosis, or edema MS- no significant deformity or atrophy Skin- no rash or lesion Psych- euthymic mood, full affect   Labs:   Lab Results  Component Value Date   WBC 7.2 07/18/2019   HGB 12.9 07/18/2019   HCT 38.3 07/18/2019   MCV 99.0 07/18/2019   PLT 211 07/18/2019    Recent Labs  Lab 07/17/19 2152 07/18/19 0601  NA 143 141  K 4.1 4.1  CL 109 110  CO2 22 23  BUN 21 15  CREATININE 0.78 0.71  CALCIUM 9.2 8.3*  PROT 6.9  --   BILITOT 1.2  --   ALKPHOS 80  --   ALT 37  --   AST 33  --   GLUCOSE 127* 204*   No results found for: CKTOTAL, CKMB, CKMBINDEX, TROPONINI Lab Results  Component Value Date   CHOL 224 (H) 07/18/2019   Lab Results  Component Value Date   HDL 86 07/18/2019   Lab Results  Component Value Date   LDLCALC 120 (H) 07/18/2019   Lab Results  Component Value Date   TRIG 91 07/18/2019   Lab Results  Component Value Date   CHOLHDL 2.6 07/18/2019   No results found for: LDLDIRECT  No results found for: DDIMER   Radiology/Studies:   CT Code Stroke CTA Head W/WO contrast Result Date: 07/17/2019 CLINICAL DATA:  69 year old female code stroke with aphasia and asymmetric left MCA hyperdensity on plain head CT today. EXAM: CT ANGIOGRAPHY HEAD AND NECK CT PERFUSION BRAIN TECHNIQUE: Multidetector CT imaging of the head and neck was performed using the standard protocol during bolus administration of intravenous contrast. Multiplanar CT image reconstructions and MIPs were obtained to evaluate the vascular anatomy. Carotid  stenosis measurements (when applicable) are obtained utilizing NASCET criteria, using the distal internal carotid diameter as the denominator. Multiphase CT imaging of the brain was performed following IV bolus contrast injection. Subsequent parametric perfusion maps were calculated using RAPID software. CONTRAST:  172m OMNIPAQUE IOHEXOL 350 MG/ML SOLN COMPARISON:  Plain head CT 2157 hours today. FINDINGS: CT Brain Perfusion Findings: ASPECTS: 10 CBF (<30%) Volume: 774mPerfusion (Tmax>6.0s) volume: 5358mypoperfusion index of 0.4. Mismatch Volume: 75m15mfarction Location:Left MCA, operculum white matter. Skeleton: Cervical spine degeneration. No acute osseous abnormality identified. Upper chest: Negative. Other neck: Partially retropharyngeal course of both carotids and carotid bifurcations. No acute findings in the neck. Aortic arch: 3 vessel arch configuration with mild to moderate calcified arch atherosclerosis. Tortuous great vessel origins. Right carotid system: Tortuous brachiocephalic artery and proximal right CCA without stenosis. Retropharyngeal right carotid bifurcation with mild cervical right ICA plaque and no stenosis. Left carotid system: Tortuous proximal left CCA without stenosis. Retropharyngeal left carotid bifurcation with bulky calcified plaque at the posterior left ICA origin  and bulb resulting in 50 % stenosis with respect to the distal vessel. No additional plaque to the skull base. Vertebral arteries: No proximal right subclavian artery or right vertebral artery origin plaque or stenosis. The right vertebral appears dominant and is patent to the skull base without stenosis. Mild plaque in the proximal left subclavian artery without stenosis. Normal left vertebral artery origin. Tortuous left V1 segment. Minimally non dominant left vertebral artery is patent to the skull base without stenosis. CTA HEAD Posterior circulation: Mild bilateral calcified V4 segment plaque without stenosis. Patent  vertebrobasilar junction and basilar artery without stenosis. Patent PICA origins. Ectatic basilar tip. Normal SCA and PCA origins otherwise. Posterior communicating arteries are diminutive or absent. Bilateral PCA branches are within normal limits. Anterior circulation: Both ICA siphons are patent. Mild to moderate calcified plaque on the left without significant siphon stenosis. Minimal to mild calcified plaque on the right without siphon stenosis. Normal ophthalmic artery origins. Patent carotid termini. Normal MCA and ACA origins. Anterior communicating artery and bilateral ACA branches are within normal limits. Right MCA M1 segment and trifurcation are patent without stenosis. Right MCA branches are within normal limits. Left MCA M1 segment is patent, and a left MCA trifurcation occurs somewhat early and is patent without stenosis. A dominant posterior left M2 branch is occluded 11-12 millimeters distal to the trifurcation as seen on series 10, image 137 and series 11, image 24. The other left M2 branches seem to remain patent. Venous sinuses: Early contrast timing. The superior sagittal sinus, torcula and the dominant right transverse sinuses appear patent. Anatomic variants: Retropharyngeal course of both cervical carotids. Mildly dominant right vertebral. Review of the MIP images confirms the above findings IMPRESSION: 1. 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 This finding was communicated to Dr. Rory Percy at 1027 hours via the [facility] messaging system. 2. CT Perfusion detects 7 mL of infarct core and 53 mL of ischemic penumbra in the corresponding Left MCA territory/operculum. 3. Superimposed 50% stenosis at the Left ICA origin due to calcified plaque. Mild left siphon plaque without stenosis. 4. Mild for age atherosclerosis elsewhere in the head and neck. Tortuous proximal great vessels and incidental retropharyngeal course  of both cervical carotids. Electronically Signed   By: Genevie Ann M.D.   On: 07/17/2019 22:36     CT HEAD WO CONTRAST Result Date: 07/18/2019 CLINICAL DATA:  Stroke follow-up. EXAM: CT HEAD WITHOUT CONTRAST TECHNIQUE: Contiguous axial images were obtained from the base of the skull through the vertex without intravenous contrast. COMPARISON:  Limited head CT catheter angiography earlier today. FINDINGS: Brain: No detectable infarct. Small volume contrast/blood product extravasation along the left sylvian fissure is unchanged from intraoperative CT. No hydrocephalus or shift. Vascular: Negative Skull: Negative Sinuses/Orbits: Negative IMPRESSION: 1. Stable small volume subarachnoid contrast/blood along the left sylvian fissure. 2. No detectable infarct. Electronically Signed   By: Monte Fantasia M.D.   On: 07/18/2019 05:13     MR MRA HEAD WO CONTRAST Result Date: 07/18/2019 CLINICAL DATA:  69 year old female with history of left M2 occlusion, status post catheter directed revascularization. Follow-up exam. She EXAM: MRI HEAD WITHOUT CONTRAST MRA HEAD WITHOUT CONTRAST TECHNIQUE: Multiplanar, multiecho pulse sequences of the brain and surrounding structures were obtained without intravenous contrast. Angiographic images of the head were obtained using MRA technique without contrast. COMPARISON:  Prior CTs from 07/17/2019. FINDINGS: MRI HEAD FINDINGS Brain: Cerebral volume within normal limits  for age. No significant cerebral white matter disease. Scattered restricted diffusion involving the left insula and adjacent left temporal lobe consistent with acute ischemic left MCA territory infarct. Slight extension into the overlying left frontal operculum. No associated mass effect. Susceptibility artifact extending along the left sylvian fissure consistent with small volume subarachnoid hemorrhage, stable from prior CT. No hemorrhagic transformation about the infarct itself. Remainder the brain is normal in  appearance with no other areas of acute or subacute ischemia. No areas of chronic cortical infarction. No mass lesion, midline shift or mass effect. No hydrocephalus. No extra-axial fluid collection. Pituitary gland within normal limits. Midline structures intact. Vascular: Major intracranial vascular flow voids are maintained. Skull and upper cervical spine: Craniocervical junction within normal limits. Upper cervical spine normal. Bone marrow signal intensity within normal limits. No scalp soft tissue abnormality. Sinuses/Orbits: Patient status post bilateral ocular lens replacement. Paranasal sinuses are clear. No mastoid effusion. Inner ear structures grossly normal. Other: None. MRA HEAD FINDINGS ANTERIOR CIRCULATION: Distal cervical segments of the internal carotid arteries are widely patent with symmetric antegrade flow. Petrous, cavernous, and supraclinoid ICAs widely patent bilaterally. A1 segments widely patent. Normal anterior communicating artery. Anterior cerebral arteries widely patent to their distal aspects. No M1 stenosis or occlusion. Normal MCA bifurcations. Previously seen occluded left M2 branch is now patent. Distal MCA branches well perfused and symmetric bilaterally. POSTERIOR CIRCULATION: Vertebral arteries patent to the vertebrobasilar junction without stenosis. Posterior inferior cerebral arteries patent bilaterally. Basilar widely patent to its distal aspect without stenosis. Superior cerebellar and posterior cerebral arteries widely patent bilaterally. No intracranial aneurysm. IMPRESSION: MRI HEAD IMPRESSION: 1. Patchy small volume acute ischemic left MCA territory infarct involving the left insula, left frontal operculum, and adjacent left temporal lobe. 2. Small volume subarachnoid hemorrhage extending along the left sylvian fissure, stable from prior CT. 3. Otherwise normal brain MRI. MRA HEAD IMPRESSION: Normal intracranial MRA. Previously seen occluded left M2 branch is now patent.  Electronically Signed   By: Jeannine Boga M.D.   On: 07/18/2019 22:03     IR PERCUTANEOUS ART THROMBECTOMY/INFUSION INTRACRANIAL INC DIAG ANGIO Result Date: 07/19/2019 INDICATION: Acute onset of severe aphasia. Occluded inferior division of the left middle cerebral artery distal M2 segment. EXAM: 1. EMERGENT LARGE VESSEL OCCLUSION THROMBOLYSIS (anterior CIRCULATION) CLINICAL DATA:  New onset of global aphasia. Occluded left middle cerebral artery inferior division distal M2 segment. COMPARISON:  CT angiogram of the head and neck of July 17, 2019. MEDICATIONS: Ancef 2 g IV antibiotic was administered within 1 hour of the procedure. ANESTHESIA/SEDATION: General anesthesia. CONTRAST:  Isovue 300 approximately 80 mL. FLUOROSCOPY TIME:  Fluoroscopy Time: 33 minutes 18 seconds (1688 mGy). COMPLICATIONS: None immediate. TECHNIQUE: Following a full explanation of the procedure along with the potential associated complications, an informed witnessed consent was obtained from patient's daughter in a 3 way call. The risks of intracranial hemorrhage of 10%, worsening neurological deficit, ventilator dependency, death and inability to revascularize were all reviewed in detail with the patient's daughter. The patient was then put under general anesthesia by the Department of Anesthesiology at [facility]. The right groin was prepped and draped in the usual sterile fashion. Thereafter using modified Seldinger technique, transfemoral access into the right common femoral artery was obtained without difficulty. Over a 0.035 inch guidewire a 8 French Pinnacle sheath was inserted. Through this, and also over a 0.035 inch guidewire a 5 [place] JB 1 catheter was advanced to the aortic arch region and selectively positioned in the  left common carotid artery. FINDINGS: The left common carotid arteriogram demonstrates the left external carotid artery and its major branches to be widely patent. The left internal carotid  artery at the bulb demonstrates a smooth shallow plaque along the posterior wall without evidence of significant stenosis or of ulcerations. The vessel, otherwise, opacifies to the cranial skull base with mild tortuosity in the mid cervical left ICA. More distally, the left internal carotid artery is seen to opacify to the cranial skull base. The petrous, the cavernous and the supraclinoid segments are widely patent. The left middle cerebral artery and the left anterior cerebral artery opacify into the capillary and venous phases. On the lateral projection there is complete occlusion of the distal M2 segment of the inferior division of the left middle cerebral artery with moderate collateralization. A dominant right transverse sinus and sigmoid sinus is seen. PROCEDURE: The diagnostic JB 1 catheter in the left common carotid artery was exchanged over a 0.035 inch 300 cm Rosen exchange guidewire for an 087 [place] balloon guide catheter which had been prepped with 50% contrast and 50% heparinized saline infusion. The guidewire was removed. Good aspiration obtained from the hub of the balloon guide catheter. Over a 0.035 inch Roadrunner guidewire, a 6 French 132 cm Catalyst guide catheter was then advanced to the horizontal petrous segment of the left internal carotid artery. The guidewire was removed. Good aspiration was obtained from the hub of the Catalyst guide catheter. A control arteriogram was then performed which demonstrated no change in the left MCA inferior division occlusions. Over a 0.014 inch standard Synchro micro guidewire, an 021 150 cm Trevo ProVue microcatheter was advanced and positioned in the proximal left middle cerebral artery. The balloon guide catheter was advanced to the proximal 1/3 of the left internal carotid artery. Using a torque device, the micro guidewire was then advanced through the occluded inferior division M2 region and advanced into the M3 M4 region followed by the microcatheter.  The guidewire was removed. Good aspiration was obtained from the hub of the microcatheter. A gentle control arteriogram performed through the microcatheter demonstrated safe position of tip of the microcatheter. A 4 mm x 40 mm Solitaire X retrieval device was then advanced to the distal end of the microcatheter. The 6 [place] Catalyst guide catheter was advanced to the origin of the occluded inferior division. With slight forward gentle traction with the right hand on the delivery micro guidewire, with the left hand the delivery microcatheter was retrieved deploying the retrieval device. With constant aspiration being applied at the hub of the 6 [place] Catalyst guide catheter using the Penumbra aspiration device, and a 60 mL syringe at the hub of the balloon guide catheter hub with proximal flow arrest for 2-1/2 minutes, the combination of the retrieval device, the microcatheter for 2-1/2 minutes, the retrieval device, the microcatheter and the retrieval device were retrieved and removed. Proximal flow arrest was reversed. Control arteriogram performed through the 6 [place] were retrieved and removed. Following reversal of flow arrest, a control arteriogram performed through the Catalyst guide catheter now demonstrated moderately improved flow through the previously occluded inferior division M2 M3 region with a TICI 2c revascularization. However, there continued to be a sizable occlusion of the inferior division distal M2 region. This prompted a second pass using the above combination again advanced into the M2 M3 region of the occluded inferior division. Again the microcatheter was verified to be in a safe position prior to advancing a 3 mm x 33  mm Trevo ProVue retrieval device to the distal end of the microcatheter. With slight forward gentle traction with the right hand on the delivery micro guidewire with the left hand the delivery microcatheter was retrieved deploying the retrieval device. The 6 [place] Catalyst  guide catheter was advanced just inside the origin of the inferior division of the left middle cerebral artery and Penumbra aspiration was initiated for 2-1/2 minutes. Again with proximal flow arrest and constant aspiration at the hub of the balloon guide catheter with a 60 mL syringe, the combination of the retrieval device, the microcatheter and the 6 [place] Catalyst guide catheter were retrieved and removed. Small clot was seen entangled within the retrieval device, and also in the aspirate. Following reversal of flow arrest, a control arteriogram performed through the balloon guide catheter in the left internal carotid artery demonstrated complete angiographic revascularization of the occluded inferior division of the left middle cerebral artery achieving a TICI 3 revascularization. Throughout the procedure, the patient's blood pressure and neurological status remained stable. No angiographic evidence of mass effect or midline shift or of extravasation was seen. The 6 [place] Catalyst guide catheter and the balloon guide catheter were retrieved and removed. The 8 French Pinnacle sheath in the right groin was removed with the successful application of an 8 French Angio-Seal closure device for hemostasis. Distal pulses remained palpable in the dorsalis pedis, and the posterior tibial arteries bilaterally unchanged. A flat panel CT of the brain revealed no evidence of mass effect or midline shift. A small amount of linear hypo attenuation was seen in the posterior peri sylvian fissure probably representing contrast leak with slight subarachnoid hemorrhage. The patient's general anesthesia was then reversed and the patient was extubated. The patient was able to move all fours equally. She responded to name. However, she continued to have significant expressive and comprehension difficulties. She was then transferred to ICU to continue with post thrombectomy management. IMPRESSION: Status post endovascular complete  revascularization of left middle cerebral artery inferior division in the M2 distal segment with 1 pass with the 4 mm x 40 mm Solitaire X retrieval device, and a 3 mm x 33 mm Penumbra Trevo ProVue retrieval device and Penumbra aspiration achieving a TICI 3 revascularization. PLAN: Follow-up in the clinic 4 weeks post discharge. Electronically Signed   By: Luanne Bras M.D.   On: 07/18/2019 13:40    VAS [place] LOWER EXTREMITY VENOUS (DVT) Result Date: 07/18/2019  Lower Venous Study Indications: Stroke, and s/p bilateral knee procedures.  Comparison Study: No prior study. Performing Technologist: Oliver Hum RVT  Examination Guidelines: A complete evaluation includes B-mode imaging, spectral Doppler, color Doppler, and power Doppler as needed of all accessible portions of each vessel. Bilateral testing is considered an integral part of a complete examination. Limited examinations for reoccurring indications may be performed as noted. Summary: Right: There is no evidence of deep vein thrombosis in the lower extremity. No cystic structure found in the popliteal fossa. Left: There is no evidence of deep vein thrombosis in the lower extremity. No cystic structure found in the popliteal fossa.  *See table(s) above for measurements and observations. Electronically signed by Deitra Mayo MD on 07/18/2019 at 4:10:55 PM.    Final     12-lead ECG SR All prior EKG's in EPIC reviewed with no documented atrial fibrillation  Telemetry SR, ST  Assessment and Plan:  1. Cryptogenic stroke The patient presents with cryptogenic stroke.  I spoke at length with the patient about monitoring for afib  with either a 30 day event monitor or an implantable loop recorder.  Risks, benefits, and alteratives to implantable loop recorder were discussed with the patient today.   At this time, the patient is very clear in her decision to proceed with implantable loop recorder.   She has a SDH,  Post IR  procedure/transformation, if AFib is found, especially if found early, will need neurology input for timing of a/c.  Wound care was reviewed with the patient (keep incision clean and dry for 3 days).  Wound check is scheduled for the patient  Please call with questions.   Baldwin Jamaica, PA-C 07/19/2019  I have seen and examined this patient with Tommye Standard.  Agree with above, note added to reflect my findings.  On exam, RRR, no murmurs, lungs clear.  Patient presented to the hospital with cryptogenic stroke. To date, no cause has been found. TEE planned for today. If unrevealing, will plan for LINQ monitor to look for atrial fibrillation. Risks and benefits discussed. Risks include but not limited to bleeding and infection. The patient understands the risks and has agreed to the procedure.  Will M. Camnitz MD 07/19/2019 3:09 PM

## 2019-07-19 NOTE — Discharge Instructions (Signed)
Follow up with your PCP Dr. Antony Salmon as scheduled next week Take ASA and plavix for 3 weeks and then ASA alone Follow up with neurology in 4 weeks. Call in 2 weeks if you did not get a call for appointment. Increase lipitor from 20mg  to 40mg  daily Follow up with Dr. Estanislado Pandy, his office will call you for appointment.   Post implant site/wound care instructions Keep incision clean and dry for 3 days.  You can remove outer dressing tomorrow. Leave steri-strips (little pieces of tape) on until seen in the office for wound check appointment. Call the office [phone]) for redness, drainage, swelling, or fever.

## 2019-07-19 NOTE — Discharge Summary (Addendum)
Stroke Discharge Summary  Patient ID: Jordan Martinez   MRN: [id number]      DOB: 09/20/1950  Date of Admission: 07/17/2019 Date of Discharge: 07/19/2019  Attending Physician:  Rosalin Hawking, MD, Stroke MD Consultant(s):   Treatment Team:  [facility], Michae Kava, MD  Olene Craven) Estanislado Pandy, MD (Interventional Neuroradiologist), Allegra Lai, MD (electrophysiology)  Patient's PCP:  Javier Glazier, MD  DISCHARGE DIAGNOSIS: [facility] course below) Stroke: L MCA infarct due to left M2 occlusion s/p tPA and IR with resultant small SAH left sylvian fissure, infarct embolic secondary to unknown source  Other diagnosis: HTN HLD DM Endometrial cancer Smoker B/l Knee pain  Allergies as of 07/19/2019      Reactions   Amlodipine    Blurred vision    Compazine [prochlorperazine Edisylate]    Stroke symptoms,   Gabapentin    Blurred vision and nystagmus   Lyrica [pregabalin]    Blurred Vision    Sertraline Other (See Comments)   Visual disturbances.    Chlorhexidine Rash   Blisters and redness to chest      Medication List    STOP taking these medications   CeleBREX 100 MG capsule Generic drug: celecoxib     TAKE these medications   acetaminophen 500 MG tablet Commonly known as: TYLENOL Take 1,000 mg by mouth every 6 (six) hours as needed for mild pain or moderate pain. What changed: Another medication with the same name was removed. Continue taking this medication, and follow the directions you see here.   amoxicillin 500 MG capsule Commonly known as: AMOXIL Take 2,000 mg by mouth once as needed (dental work).   aspirin EC 81 MG tablet Take 81 mg by mouth daily.   atenolol 50 MG tablet Commonly known as: TENORMIN Take 50 mg by mouth daily.   atorvastatin 40 MG tablet Commonly known as: LIPITOR Take 1 tablet (40 mg total) by mouth at bedtime. What changed:   medication strength  how much to take   Biotin 5000 MCG Tabs Take 5,000 mcg by mouth daily.    cholecalciferol 10 MCG (400 UNIT) Tabs tablet Commonly known as: VITAMIN D3 Take 400 Units by mouth daily.   clopidogrel 75 MG tablet Commonly known as: PLAVIX Take 1 tablet (75 mg total) by mouth daily for 21 days. Start taking on: July 20, 2019   cyclobenzaprine 5 MG tablet Commonly known as: FLEXERIL Take 1 tablet (5 mg total) by mouth 3 (three) times daily. What changed:   how much to take  when to take this  reasons to take this   Fish Oil 1200 MG Caps Take 1,200 mg by mouth daily.   lisinopril 10 MG tablet Commonly known as: ZESTRIL Take 10 mg by mouth daily.   metFORMIN 500 MG tablet Commonly known as: GLUCOPHAGE TAKE 1 TABLET BY MOUTH TWICE A DAY WITH MEALS   omeprazole 40 MG capsule Commonly known as: PRILOSEC Take 1 capsule (40 mg total) by mouth daily.   saccharomyces boulardii 250 MG capsule Commonly known as: FLORASTOR Take 250 mg by mouth 2 (two) times daily as needed (for one week following dental procedure).   Systane Balance 0.6 % Soln Generic drug: Propylene Glycol Place 2 drops into both eyes 2 (two) times daily.   traMADol 50 MG tablet Commonly known as: ULTRAM Take 50-100 mg by mouth at bedtime as needed for pain.       LABORATORY STUDIES CBC    Component Value Date/Time  WBC 7.2 07/18/2019 0601   RBC 3.87 07/18/2019 0601   HGB 12.9 07/18/2019 0601   HGB 13.0 07/18/2018 1611   HCT 38.3 07/18/2019 0601   PLT 211 07/18/2019 0601   PLT 247 07/18/2018 1611   MCV 99.0 07/18/2019 0601   MCH 33.3 07/18/2019 0601   MCHC 33.7 07/18/2019 0601   RDW 13.7 07/18/2019 0601   LYMPHSABS 0.9 07/18/2019 0601   MONOABS 0.1 07/18/2019 0601   EOSABS 0.0 07/18/2019 0601   BASOSABS 0.0 07/18/2019 0601   CMP    Component Value Date/Time   NA 141 07/18/2019 0601   K 4.1 07/18/2019 0601   CL 110 07/18/2019 0601   CO2 23 07/18/2019 0601   GLUCOSE 204 (H) 07/18/2019 0601   BUN 15 07/18/2019 0601   CREATININE 0.71 07/18/2019 0601    CREATININE 0.82 10/26/2018 0900   CALCIUM 8.3 (L) 07/18/2019 0601   PROT 6.9 07/17/2019 2152   ALBUMIN 4.2 07/17/2019 2152   AST 33 07/17/2019 2152   AST 22 10/26/2018 0900   ALT 37 07/17/2019 2152   ALT 32 10/26/2018 0900   ALKPHOS 80 07/17/2019 2152   BILITOT 1.2 07/17/2019 2152   BILITOT 0.3 10/26/2018 0900   GFRNONAA >60 07/18/2019 0601   GFRNONAA >60 10/26/2018 0900   GFRAA >60 07/18/2019 0601   GFRAA >60 10/26/2018 0900   COAGS Lab Results  Component Value Date   INR 0.9 07/17/2019   INR 0.8 12/01/2018   INR 0.82 06/15/2018   Lipid Panel    Component Value Date/Time   CHOL 224 (H) 07/18/2019 0601   TRIG 91 07/18/2019 0601   HDL 86 07/18/2019 0601   CHOLHDL 2.6 07/18/2019 0601   VLDL 18 07/18/2019 0601   LDLCALC 120 (H) 07/18/2019 0601   HgbA1C  Lab Results  Component Value Date   HGBA1C 7.0 (H) 07/18/2019   Urinalysis    Component Value Date/Time   COLORURINE STRAW (A) 07/18/2019 0437   APPEARANCEUR CLEAR 07/18/2019 0437   LABSPEC 1.018 07/18/2019 0437   PHURINE 5.0 07/18/2019 0437   GLUCOSEU NEGATIVE 07/18/2019 0437   HGBUR NEGATIVE 07/18/2019 0437   BILIRUBINUR NEGATIVE 07/18/2019 0437   KETONESUR NEGATIVE 07/18/2019 0437   PROTEINUR 30 (A) 07/18/2019 0437   NITRITE NEGATIVE 07/18/2019 0437   LEUKOCYTESUR NEGATIVE 07/18/2019 0437   Urine Drug Screen     Component Value Date/Time   LABOPIA NONE DETECTED 07/18/2019 0437   COCAINSCRNUR NONE DETECTED 07/18/2019 0437   LABBENZ NONE DETECTED 07/18/2019 0437   AMPHETMU NONE DETECTED 07/18/2019 0437   THCU NONE DETECTED 07/18/2019 0437   LABBARB NONE DETECTED 07/18/2019 0437    Alcohol Level    Component Value Date/Time   ETH <10 07/17/2019 2152     SIGNIFICANT DIAGNOSTIC STUDIES CT Code Stroke CTA Head W/WO contrast  Result Date: 07/17/2019 CLINICAL DATA:  69 year old female code stroke with aphasia and asymmetric left MCA hyperdensity on plain head CT today. EXAM: CT ANGIOGRAPHY HEAD AND NECK  CT PERFUSION BRAIN TECHNIQUE: Multidetector CT imaging of the head and neck was performed using the standard protocol during bolus administration of intravenous contrast. Multiplanar CT image reconstructions and MIPs were obtained to evaluate the vascular anatomy. Carotid stenosis measurements (when applicable) are obtained utilizing NASCET criteria, using the distal internal carotid diameter as the denominator. Multiphase CT imaging of the brain was performed following IV bolus contrast injection. Subsequent parametric perfusion maps were calculated using RAPID software. CONTRAST:  1107mL OMNIPAQUE IOHEXOL 350 MG/ML SOLN COMPARISON:  Plain head CT 2157 hours today. FINDINGS: CT Brain Perfusion Findings: ASPECTS: 10 CBF (<30%) Volume: 71mL Perfusion (Tmax>6.0s) volume: 52mL hypoperfusion index of 0.4. Mismatch Volume: 57mL Infarction Location:Left MCA, operculum white matter. Skeleton: Cervical spine degeneration. No acute osseous abnormality identified. Upper chest: Negative. Other neck: Partially retropharyngeal course of both carotids and carotid bifurcations. No acute findings in the neck. Aortic arch: 3 vessel arch configuration with mild to moderate calcified arch atherosclerosis. Tortuous great vessel origins. Right carotid system: Tortuous brachiocephalic artery and proximal right CCA without stenosis. Retropharyngeal right carotid bifurcation with mild cervical right ICA plaque and no stenosis. Left carotid system: Tortuous proximal left CCA without stenosis. Retropharyngeal left carotid bifurcation with bulky calcified plaque at the posterior left ICA origin and bulb resulting in 50 % stenosis with respect to the distal vessel. No additional plaque to the skull base. Vertebral arteries: No proximal right subclavian artery or right vertebral artery origin plaque or stenosis. The right vertebral appears dominant and is patent to the skull base without stenosis. Mild plaque in the proximal left subclavian  artery without stenosis. Normal left vertebral artery origin. Tortuous left V1 segment. Minimally non dominant left vertebral artery is patent to the skull base without stenosis. CTA HEAD Posterior circulation: Mild bilateral calcified V4 segment plaque without stenosis. Patent vertebrobasilar junction and basilar artery without stenosis. Patent PICA origins. Ectatic basilar tip. Normal SCA and PCA origins otherwise. Posterior communicating arteries are diminutive or absent. Bilateral PCA branches are within normal limits. Anterior circulation: Both ICA siphons are patent. Mild to moderate calcified plaque on the left without significant siphon stenosis. Minimal to mild calcified plaque on the right without siphon stenosis. Normal ophthalmic artery origins. Patent carotid termini. Normal MCA and ACA origins. Anterior communicating artery and bilateral ACA branches are within normal limits. Right MCA M1 segment and trifurcation are patent without stenosis. Right MCA branches are within normal limits. Left MCA M1 segment is patent, and a left MCA trifurcation occurs somewhat early and is patent without stenosis. A dominant posterior left M2 branch is occluded 11-12 millimeters distal to the trifurcation as seen on series 10, image 137 and series 11, image 24. The other left M2 branches seem to remain patent. Venous sinuses: Early contrast timing. The superior sagittal sinus, torcula and the dominant right transverse sinuses appear patent. Anatomic variants: Retropharyngeal course of both cervical carotids. Mildly dominant right vertebral. Review of the MIP images confirms the above findings IMPRESSION: 1. 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 This finding was communicated to Dr. Rory Percy at 1027 hours via the [facility] messaging system. 2. CT Perfusion detects 7 mL of infarct core and 53 mL of ischemic penumbra in the corresponding Left  MCA territory/operculum. 3. Superimposed 50% stenosis at the Left ICA origin due to calcified plaque. Mild left siphon plaque without stenosis. 4. Mild for age atherosclerosis elsewhere in the head and neck. Tortuous proximal great vessels and incidental retropharyngeal course of both cervical carotids. Electronically Signed   By: Genevie Ann M.D.   On: 07/17/2019 22:36   CT HEAD WO CONTRAST  Result Date: 07/18/2019 CLINICAL DATA:  Stroke follow-up. EXAM: CT HEAD WITHOUT CONTRAST TECHNIQUE: Contiguous axial images were obtained from the base of the skull through the vertex without intravenous contrast. COMPARISON:  Limited head CT catheter angiography earlier today. FINDINGS: Brain: No detectable infarct. Small volume contrast/blood product extravasation along the left sylvian fissure is unchanged  from intraoperative CT. No hydrocephalus or shift. Vascular: Negative Skull: Negative Sinuses/Orbits: Negative IMPRESSION: 1. Stable small volume subarachnoid contrast/blood along the left sylvian fissure. 2. No detectable infarct. Electronically Signed   By: Monte Fantasia M.D.   On: 07/18/2019 05:13   CT Code Stroke CTA Neck W/WO contrast  Result Date: 07/17/2019 CLINICAL DATA:  69 year old female code stroke with aphasia and asymmetric left MCA hyperdensity on plain head CT today. EXAM: CT ANGIOGRAPHY HEAD AND NECK CT PERFUSION BRAIN TECHNIQUE: Multidetector CT imaging of the head and neck was performed using the standard protocol during bolus administration of intravenous contrast. Multiplanar CT image reconstructions and MIPs were obtained to evaluate the vascular anatomy. Carotid stenosis measurements (when applicable) are obtained utilizing NASCET criteria, using the distal internal carotid diameter as the denominator. Multiphase CT imaging of the brain was performed following IV bolus contrast injection. Subsequent parametric perfusion maps were calculated using RAPID software. CONTRAST:  111mL OMNIPAQUE  IOHEXOL 350 MG/ML SOLN COMPARISON:  Plain head CT 2157 hours today. FINDINGS: CT Brain Perfusion Findings: ASPECTS: 10 CBF (<30%) Volume: 25mL Perfusion (Tmax>6.0s) volume: 37mL hypoperfusion index of 0.4. Mismatch Volume: 38mL Infarction Location:Left MCA, operculum white matter. Skeleton: Cervical spine degeneration. No acute osseous abnormality identified. Upper chest: Negative. Other neck: Partially retropharyngeal course of both carotids and carotid bifurcations. No acute findings in the neck. Aortic arch: 3 vessel arch configuration with mild to moderate calcified arch atherosclerosis. Tortuous great vessel origins. Right carotid system: Tortuous brachiocephalic artery and proximal right CCA without stenosis. Retropharyngeal right carotid bifurcation with mild cervical right ICA plaque and no stenosis. Left carotid system: Tortuous proximal left CCA without stenosis. Retropharyngeal left carotid bifurcation with bulky calcified plaque at the posterior left ICA origin and bulb resulting in 50 % stenosis with respect to the distal vessel. No additional plaque to the skull base. Vertebral arteries: No proximal right subclavian artery or right vertebral artery origin plaque or stenosis. The right vertebral appears dominant and is patent to the skull base without stenosis. Mild plaque in the proximal left subclavian artery without stenosis. Normal left vertebral artery origin. Tortuous left V1 segment. Minimally non dominant left vertebral artery is patent to the skull base without stenosis. CTA HEAD Posterior circulation: Mild bilateral calcified V4 segment plaque without stenosis. Patent vertebrobasilar junction and basilar artery without stenosis. Patent PICA origins. Ectatic basilar tip. Normal SCA and PCA origins otherwise. Posterior communicating arteries are diminutive or absent. Bilateral PCA branches are within normal limits. Anterior circulation: Both ICA siphons are patent. Mild to moderate calcified  plaque on the left without significant siphon stenosis. Minimal to mild calcified plaque on the right without siphon stenosis. Normal ophthalmic artery origins. Patent carotid termini. Normal MCA and ACA origins. Anterior communicating artery and bilateral ACA branches are within normal limits. Right MCA M1 segment and trifurcation are patent without stenosis. Right MCA branches are within normal limits. Left MCA M1 segment is patent, and a left MCA trifurcation occurs somewhat early and is patent without stenosis. A dominant posterior left M2 branch is occluded 11-12 millimeters distal to the trifurcation as seen on series 10, image 137 and series 11, image 24. The other left M2 branches seem to remain patent. Venous sinuses: Early contrast timing. The superior sagittal sinus, torcula and the dominant right transverse sinuses appear patent. Anatomic variants: Retropharyngeal course of both cervical carotids. Mildly dominant right vertebral. Review of the MIP images confirms the above findings IMPRESSION: 1. Positive for Emergent Large Vessel Occlusion of a  dominant posterior Left M2 branch about 12 mm from the Left MCA trifurcation. This finding was communicated to Dr. Rory Percy at 1027 hours via the [facility] messaging system. 2. CT Perfusion detects 7 mL of infarct core and 53 mL of ischemic penumbra in the corresponding Left MCA territory/operculum. 3. Superimposed 50% stenosis at the Left ICA origin due to calcified plaque. Mild left siphon plaque without stenosis. 4. Mild for age atherosclerosis elsewhere in the head and neck. Tortuous proximal great vessels and incidental retropharyngeal course of both cervical carotids. Electronically Signed   By: Genevie Ann M.D.   On: 07/17/2019 22:36   MR MRA HEAD WO CONTRAST  Result Date: 07/18/2019 CLINICAL DATA:  69 year old female with history of left M2 occlusion, status post catheter directed revascularization. Follow-up exam. She EXAM: MRI HEAD WITHOUT CONTRAST MRA HEAD  WITHOUT CONTRAST TECHNIQUE: Multiplanar, multiecho pulse sequences of the brain and surrounding structures were obtained without intravenous contrast. Angiographic images of the head were obtained using MRA technique without contrast. COMPARISON:  Prior CTs from 07/17/2019. FINDINGS: MRI HEAD FINDINGS Brain: Cerebral volume within normal limits for age. No significant cerebral white matter disease. Scattered restricted diffusion involving the left insula and adjacent left temporal lobe consistent with acute ischemic left MCA territory infarct. Slight extension into the overlying left frontal operculum. No associated mass effect. Susceptibility artifact extending along the left sylvian fissure consistent with small volume subarachnoid hemorrhage, stable from prior CT. No hemorrhagic transformation about the infarct itself. Remainder the brain is normal in appearance with no other areas of acute or subacute ischemia. No areas of chronic cortical infarction. No mass lesion, midline shift or mass effect. No hydrocephalus. No extra-axial fluid collection. Pituitary gland within normal limits. Midline structures intact. Vascular: Major intracranial vascular flow voids are maintained. Skull and upper cervical spine: Craniocervical junction within normal limits. Upper cervical spine normal. Bone marrow signal intensity within normal limits. No scalp soft tissue abnormality. Sinuses/Orbits: Patient status post bilateral ocular lens replacement. Paranasal sinuses are clear. No mastoid effusion. Inner ear structures grossly normal. Other: None. MRA HEAD FINDINGS ANTERIOR CIRCULATION: Distal cervical segments of the internal carotid arteries are widely patent with symmetric antegrade flow. Petrous, cavernous, and supraclinoid ICAs widely patent bilaterally. A1 segments widely patent. Normal anterior communicating artery. Anterior cerebral arteries widely patent to their distal aspects. No M1 stenosis or occlusion. Normal MCA  bifurcations. Previously seen occluded left M2 branch is now patent. Distal MCA branches well perfused and symmetric bilaterally. POSTERIOR CIRCULATION: Vertebral arteries patent to the vertebrobasilar junction without stenosis. Posterior inferior cerebral arteries patent bilaterally. Basilar widely patent to its distal aspect without stenosis. Superior cerebellar and posterior cerebral arteries widely patent bilaterally. No intracranial aneurysm. IMPRESSION: MRI HEAD IMPRESSION: 1. Patchy small volume acute ischemic left MCA territory infarct involving the left insula, left frontal operculum, and adjacent left temporal lobe. 2. Small volume subarachnoid hemorrhage extending along the left sylvian fissure, stable from prior CT. 3. Otherwise normal brain MRI. MRA HEAD IMPRESSION: Normal intracranial MRA. Previously seen occluded left M2 branch is now patent. Electronically Signed   By: Jeannine Boga M.D.   On: 07/18/2019 22:03   MR BRAIN WO CONTRAST  Result Date: 07/18/2019 CLINICAL DATA:  69 year old female with history of left M2 occlusion, status post catheter directed revascularization. Follow-up exam. She EXAM: MRI HEAD WITHOUT CONTRAST MRA HEAD WITHOUT CONTRAST TECHNIQUE: Multiplanar, multiecho pulse sequences of the brain and surrounding structures were obtained without intravenous contrast. Angiographic images of the head were obtained using  MRA technique without contrast. COMPARISON:  Prior CTs from 07/17/2019. FINDINGS: MRI HEAD FINDINGS Brain: Cerebral volume within normal limits for age. No significant cerebral white matter disease. Scattered restricted diffusion involving the left insula and adjacent left temporal lobe consistent with acute ischemic left MCA territory infarct. Slight extension into the overlying left frontal operculum. No associated mass effect. Susceptibility artifact extending along the left sylvian fissure consistent with small volume subarachnoid hemorrhage, stable from  prior CT. No hemorrhagic transformation about the infarct itself. Remainder the brain is normal in appearance with no other areas of acute or subacute ischemia. No areas of chronic cortical infarction. No mass lesion, midline shift or mass effect. No hydrocephalus. No extra-axial fluid collection. Pituitary gland within normal limits. Midline structures intact. Vascular: Major intracranial vascular flow voids are maintained. Skull and upper cervical spine: Craniocervical junction within normal limits. Upper cervical spine normal. Bone marrow signal intensity within normal limits. No scalp soft tissue abnormality. Sinuses/Orbits: Patient status post bilateral ocular lens replacement. Paranasal sinuses are clear. No mastoid effusion. Inner ear structures grossly normal. Other: None. MRA HEAD FINDINGS ANTERIOR CIRCULATION: Distal cervical segments of the internal carotid arteries are widely patent with symmetric antegrade flow. Petrous, cavernous, and supraclinoid ICAs widely patent bilaterally. A1 segments widely patent. Normal anterior communicating artery. Anterior cerebral arteries widely patent to their distal aspects. No M1 stenosis or occlusion. Normal MCA bifurcations. Previously seen occluded left M2 branch is now patent. Distal MCA branches well perfused and symmetric bilaterally. POSTERIOR CIRCULATION: Vertebral arteries patent to the vertebrobasilar junction without stenosis. Posterior inferior cerebral arteries patent bilaterally. Basilar widely patent to its distal aspect without stenosis. Superior cerebellar and posterior cerebral arteries widely patent bilaterally. No intracranial aneurysm. IMPRESSION: MRI HEAD IMPRESSION: 1. Patchy small volume acute ischemic left MCA territory infarct involving the left insula, left frontal operculum, and adjacent left temporal lobe. 2. Small volume subarachnoid hemorrhage extending along the left sylvian fissure, stable from prior CT. 3. Otherwise normal brain MRI.  MRA HEAD IMPRESSION: Normal intracranial MRA. Previously seen occluded left M2 branch is now patent. Electronically Signed   By: Jeannine Boga M.D.   On: 07/18/2019 22:03   CT Code Stroke Cerebral Perfusion with contrast  Result Date: 07/17/2019 CLINICAL DATA:  69 year old female code stroke with aphasia and asymmetric left MCA hyperdensity on plain head CT today. EXAM: CT ANGIOGRAPHY HEAD AND NECK CT PERFUSION BRAIN TECHNIQUE: Multidetector CT imaging of the head and neck was performed using the standard protocol during bolus administration of intravenous contrast. Multiplanar CT image reconstructions and MIPs were obtained to evaluate the vascular anatomy. Carotid stenosis measurements (when applicable) are obtained utilizing NASCET criteria, using the distal internal carotid diameter as the denominator. Multiphase CT imaging of the brain was performed following IV bolus contrast injection. Subsequent parametric perfusion maps were calculated using RAPID software. CONTRAST:  163mL OMNIPAQUE IOHEXOL 350 MG/ML SOLN COMPARISON:  Plain head CT 2157 hours today. FINDINGS: CT Brain Perfusion Findings: ASPECTS: 10 CBF (<30%) Volume: 68mL Perfusion (Tmax>6.0s) volume: 53mL hypoperfusion index of 0.4. Mismatch Volume: 78mL Infarction Location:Left MCA, operculum white matter. Skeleton: Cervical spine degeneration. No acute osseous abnormality identified. Upper chest: Negative. Other neck: Partially retropharyngeal course of both carotids and carotid bifurcations. No acute findings in the neck. Aortic arch: 3 vessel arch configuration with mild to moderate calcified arch atherosclerosis. Tortuous great vessel origins. Right carotid system: Tortuous brachiocephalic artery and proximal right CCA without stenosis. Retropharyngeal right carotid bifurcation with mild cervical right ICA plaque and no  stenosis. Left carotid system: Tortuous proximal left CCA without stenosis. Retropharyngeal left carotid bifurcation with  bulky calcified plaque at the posterior left ICA origin and bulb resulting in 50 % stenosis with respect to the distal vessel. No additional plaque to the skull base. Vertebral arteries: No proximal right subclavian artery or right vertebral artery origin plaque or stenosis. The right vertebral appears dominant and is patent to the skull base without stenosis. Mild plaque in the proximal left subclavian artery without stenosis. Normal left vertebral artery origin. Tortuous left V1 segment. Minimally non dominant left vertebral artery is patent to the skull base without stenosis. CTA HEAD Posterior circulation: Mild bilateral calcified V4 segment plaque without stenosis. Patent vertebrobasilar junction and basilar artery without stenosis. Patent PICA origins. Ectatic basilar tip. Normal SCA and PCA origins otherwise. Posterior communicating arteries are diminutive or absent. Bilateral PCA branches are within normal limits. Anterior circulation: Both ICA siphons are patent. Mild to moderate calcified plaque on the left without significant siphon stenosis. Minimal to mild calcified plaque on the right without siphon stenosis. Normal ophthalmic artery origins. Patent carotid termini. Normal MCA and ACA origins. Anterior communicating artery and bilateral ACA branches are within normal limits. Right MCA M1 segment and trifurcation are patent without stenosis. Right MCA branches are within normal limits. Left MCA M1 segment is patent, and a left MCA trifurcation occurs somewhat early and is patent without stenosis. A dominant posterior left M2 branch is occluded 11-12 millimeters distal to the trifurcation as seen on series 10, image 137 and series 11, image 24. The other left M2 branches seem to remain patent. Venous sinuses: Early contrast timing. The superior sagittal sinus, torcula and the dominant right transverse sinuses appear patent. Anatomic variants: Retropharyngeal course of both cervical carotids. Mildly  dominant right vertebral. Review of the MIP images confirms the above findings IMPRESSION: 1. 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 This finding was communicated to Dr. Rory Percy at 1027 hours via the [facility] messaging system. 2. CT Perfusion detects 7 mL of infarct core and 53 mL of ischemic penumbra in the corresponding Left MCA territory/operculum. 3. Superimposed 50% stenosis at the Left ICA origin due to calcified plaque. Mild left siphon plaque without stenosis. 4. Mild for age atherosclerosis elsewhere in the head and neck. Tortuous proximal great vessels and incidental retropharyngeal course of both cervical carotids. Electronically Signed   By: Genevie Ann M.D.   On: 07/17/2019 22:36   ECHOCARDIOGRAM COMPLETE  Result Date: 07/18/2019   ECHOCARDIOGRAM REPORT   Patient Name:   QUETZALLI LAFFERTY Date of Exam: 07/18/2019 Medical Rec #:  [id number]     Height:       64.0 in Accession #:    [id number]    Weight:       202.6 lb Date of Birth:  01-Jul-1951     BSA:          1.97 m Patient Age:    69 years      BP:           134/66 mmHg Patient Gender: F             HR:           85 bpm. Exam Location:  Inpatient Procedure: 2D Echo, Cardiac Doppler and Color Doppler Indications:    Stroke  History:        Patient has no prior history of Echocardiogram examinations.  Stroke; Risk Factors:Hypertension and Diabetes. Cancer.                 Chemotherapy.  Sonographer:    Roseanna Rainbow RDCS Referring Phys: [id number] ASHISH ARORA IMPRESSIONS  1. Left ventricular ejection fraction, by visual estimation, is 65 to 70%. The left ventricle has hyperdynamic function. There is mildly increased left ventricular hypertrophy.  2. Left ventricular diastolic parameters are consistent with Grade I diastolic dysfunction (impaired relaxation).  3. The left ventricle has no regional wall motion abnormalities.  4. Global right ventricle has hyperdynamic  systolic function.The right ventricular size is normal. No increase in right ventricular wall thickness.  5. Left atrial size was normal.  6. Right atrial size was normal.  7. Mild mitral annular calcification.  8. The mitral valve is normal in structure. No evidence of mitral valve regurgitation. No evidence of mitral stenosis.  9. The tricuspid valve is normal in structure. 10. The aortic valve is normal in structure. Aortic valve regurgitation is not visualized. Mild aortic valve sclerosis without stenosis. 11. The pulmonic valve was normal in structure. Pulmonic valve regurgitation is not visualized. 12. The inferior vena cava is normal in size with greater than 50% respiratory variability, suggesting right atrial pressure of 3 mmHg. FINDINGS  Left Ventricle: Left ventricular ejection fraction, by visual estimation, is 65 to 70%. The left ventricle has hyperdynamic function. The left ventricle has no regional wall motion abnormalities. The left ventricular internal cavity size was the left ventricle is normal in size. There is mildly increased left ventricular hypertrophy. Concentric left ventricular hypertrophy. Left ventricular diastolic parameters are consistent with Grade I diastolic dysfunction (impaired relaxation). Indeterminate filling pressures. Right Ventricle: The right ventricular size is normal. No increase in right ventricular wall thickness. Global RV systolic function is has hyperdynamic systolic function. Left Atrium: Left atrial size was normal in size. Right Atrium: Right atrial size was normal in size Pericardium: There is no evidence of pericardial effusion. Mitral Valve: The mitral valve is normal in structure. Mild mitral annular calcification. No evidence of mitral valve regurgitation. No evidence of mitral valve stenosis by observation. Tricuspid Valve: The tricuspid valve is normal in structure. Tricuspid valve regurgitation is not demonstrated. Aortic Valve: The aortic valve is normal  in structure. Aortic valve regurgitation is not visualized. Mild aortic valve sclerosis is present, with no evidence of aortic valve stenosis. Pulmonic Valve: The pulmonic valve was normal in structure. Pulmonic valve regurgitation is not visualized. Pulmonic regurgitation is not visualized. Aorta: The aortic root, ascending aorta and aortic arch are all structurally normal, with no evidence of dilitation or obstruction. Venous: The inferior vena cava is normal in size with greater than 50% respiratory variability, suggesting right atrial pressure of 3 mmHg. IAS/Shunts: No atrial level shunt detected by color flow Doppler. There is no evidence of a patent foramen ovale. No ventricular septal defect is seen or detected. There is no evidence of an atrial septal defect.  LEFT VENTRICLE PLAX 2D LVIDd:         3.52 cm       Diastology LVIDs:         2.31 cm       LV e' lateral:   7.72 cm/s LV PW:         1.29 cm       LV E/e' lateral: 10.3 LV IVS:        1.22 cm       LV e' medial:    5.66 cm/s  LVOT diam:     1.50 cm       LV E/e' medial:  14.1 LV SV:         33 ml LV SV Index:   16.01         2D Longitudinal Strain LVOT Area:     1.77 cm      2D Strain GLS Avg:     -23.6 %  LV Volumes (MOD) LV area d, A2C:    18.10 cm LV area d, A4C:    20.60 cm LV area s, A2C:    8.89 cm LV area s, A4C:    8.93 cm LV major d, A2C:   6.92 cm LV major d, A4C:   6.91 cm LV major s, A2C:   5.26 cm LV major s, A4C:   5.06 cm LV vol d, MOD A2C: 39.7 ml LV vol d, MOD A4C: 50.0 ml LV vol s, MOD A2C: 12.9 ml LV vol s, MOD A4C: 13.5 ml LV SV MOD A2C:     26.8 ml LV SV MOD A4C:     50.0 ml LV SV MOD BP:      31.0 ml RIGHT VENTRICLE            IVC RV S prime:     9.03 cm/s  IVC diam: 1.84 cm TAPSE (M-mode): 1.9 cm LEFT ATRIUM             Index       RIGHT ATRIUM           Index LA diam:        3.70 cm 1.88 cm/m  RA Area:     12.00 cm LA Vol (A2C):   29.7 ml 15.10 ml/m RA Volume:   28.90 ml  14.69 ml/m LA Vol (A4C):   50.8 ml 25.82 ml/m  LA Biplane Vol: 40.2 ml 20.44 ml/m  AORTIC VALVE LVOT Vmax:   117.00 cm/s LVOT Vmean:  75.000 cm/s LVOT VTI:    0.227 m  AORTA Ao Root diam: 3.20 cm Ao Asc diam:  2.80 cm MITRAL VALVE MV Area (PHT): 3.42 cm              SHUNTS MV PHT:        64.38 msec            Systemic VTI:  0.23 m MV Decel Time: 222 msec              Systemic Diam: 1.50 cm MV E velocity: 79.90 cm/s  103 cm/s MV A velocity: 114.00 cm/s 70.3 cm/s MV E/A ratio:  0.70        1.5  Mihai Croitoru MD Electronically signed by Sanda Klein MD Signature Date/Time: 07/18/2019/4:01:06 PM    Final    IR PERCUTANEOUS ART THROMBECTOMY/INFUSION INTRACRANIAL INC DIAG ANGIO  Result Date: 07/19/2019 INDICATION: Acute onset of severe aphasia. Occluded inferior division of the left middle cerebral artery distal M2 segment. EXAM: 1. EMERGENT LARGE VESSEL OCCLUSION THROMBOLYSIS (anterior CIRCULATION) CLINICAL DATA:  New onset of global aphasia. Occluded left middle cerebral artery inferior division distal M2 segment. COMPARISON:  CT angiogram of the head and neck of July 17, 2019. MEDICATIONS: Ancef 2 g IV antibiotic was administered within 1 hour of the procedure. ANESTHESIA/SEDATION: General anesthesia. CONTRAST:  Isovue 300 approximately 80 mL. FLUOROSCOPY TIME:  Fluoroscopy Time: 33 minutes 18 seconds (1688 mGy). COMPLICATIONS: None immediate. TECHNIQUE: Following a full explanation of the procedure along with the potential associated complications,  an informed witnessed consent was obtained from patient's daughter in a 3 way call. The risks of intracranial hemorrhage of 10%, worsening neurological deficit, ventilator dependency, death and inability to revascularize were all reviewed in detail with the patient's daughter. The patient was then put under general anesthesia by the Department of Anesthesiology at [facility]. The right groin was prepped and draped in the usual sterile fashion. Thereafter using modified Seldinger technique,  transfemoral access into the right common femoral artery was obtained without difficulty. Over a 0.035 inch guidewire a 8 French Pinnacle sheath was inserted. Through this, and also over a 0.035 inch guidewire a 5 [place] JB 1 catheter was advanced to the aortic arch region and selectively positioned in the left common carotid artery. FINDINGS: The left common carotid arteriogram demonstrates the left external carotid artery and its major branches to be widely patent. The left internal carotid artery at the bulb demonstrates a smooth shallow plaque along the posterior wall without evidence of significant stenosis or of ulcerations. The vessel, otherwise, opacifies to the cranial skull base with mild tortuosity in the mid cervical left ICA. More distally, the left internal carotid artery is seen to opacify to the cranial skull base. The petrous, the cavernous and the supraclinoid segments are widely patent. The left middle cerebral artery and the left anterior cerebral artery opacify into the capillary and venous phases. On the lateral projection there is complete occlusion of the distal M2 segment of the inferior division of the left middle cerebral artery with moderate collateralization. A dominant right transverse sinus and sigmoid sinus is seen. PROCEDURE: The diagnostic JB 1 catheter in the left common carotid artery was exchanged over a 0.035 inch 300 cm Rosen exchange guidewire for an 087 [place] balloon guide catheter which had been prepped with 50% contrast and 50% heparinized saline infusion. The guidewire was removed. Good aspiration obtained from the hub of the balloon guide catheter. Over a 0.035 inch Roadrunner guidewire, a 6 French 132 cm Catalyst guide catheter was then advanced to the horizontal petrous segment of the left internal carotid artery. The guidewire was removed. Good aspiration was obtained from the hub of the Catalyst guide catheter. A control arteriogram was then performed which  demonstrated no change in the left MCA inferior division occlusions. Over a 0.014 inch standard Synchro micro guidewire, an 021 150 cm Trevo ProVue microcatheter was advanced and positioned in the proximal left middle cerebral artery. The balloon guide catheter was advanced to the proximal 1/3 of the left internal carotid artery. Using a torque device, the micro guidewire was then advanced through the occluded inferior division M2 region and advanced into the M3 M4 region followed by the microcatheter. The guidewire was removed. Good aspiration was obtained from the hub of the microcatheter. A gentle control arteriogram performed through the microcatheter demonstrated safe position of tip of the microcatheter. A 4 mm x 40 mm Solitaire X retrieval device was then advanced to the distal end of the microcatheter. The 6 [place] Catalyst guide catheter was advanced to the origin of the occluded inferior division. With slight forward gentle traction with the right hand on the delivery micro guidewire, with the left hand the delivery microcatheter was retrieved deploying the retrieval device. With constant aspiration being applied at the hub of the 6 [place] Catalyst guide catheter using the Penumbra aspiration device, and a 60 mL syringe at the hub of the balloon guide catheter hub with proximal flow arrest for 2-1/2 minutes, the combination of  the retrieval device, the microcatheter for 2-1/2 minutes, the retrieval device, the microcatheter and the retrieval device were retrieved and removed. Proximal flow arrest was reversed. Control arteriogram performed through the 6 [place] were retrieved and removed. Following reversal of flow arrest, a control arteriogram performed through the Catalyst guide catheter now demonstrated moderately improved flow through the previously occluded inferior division M2 M3 region with a TICI 2c revascularization. However, there continued to be a sizable occlusion of the inferior division distal  M2 region. This prompted a second pass using the above combination again advanced into the M2 M3 region of the occluded inferior division. Again the microcatheter was verified to be in a safe position prior to advancing a 3 mm x 33 mm Trevo ProVue retrieval device to the distal end of the microcatheter. With slight forward gentle traction with the right hand on the delivery micro guidewire with the left hand the delivery microcatheter was retrieved deploying the retrieval device. The 6 [place] Catalyst guide catheter was advanced just inside the origin of the inferior division of the left middle cerebral artery and Penumbra aspiration was initiated for 2-1/2 minutes. Again with proximal flow arrest and constant aspiration at the hub of the balloon guide catheter with a 60 mL syringe, the combination of the retrieval device, the microcatheter and the 6 [place] Catalyst guide catheter were retrieved and removed. Small clot was seen entangled within the retrieval device, and also in the aspirate. Following reversal of flow arrest, a control arteriogram performed through the balloon guide catheter in the left internal carotid artery demonstrated complete angiographic revascularization of the occluded inferior division of the left middle cerebral artery achieving a TICI 3 revascularization. Throughout the procedure, the patient's blood pressure and neurological status remained stable. No angiographic evidence of mass effect or midline shift or of extravasation was seen. The 6 [place] Catalyst guide catheter and the balloon guide catheter were retrieved and removed. The 8 French Pinnacle sheath in the right groin was removed with the successful application of an 8 French Angio-Seal closure device for hemostasis. Distal pulses remained palpable in the dorsalis pedis, and the posterior tibial arteries bilaterally unchanged. A flat panel CT of the brain revealed no evidence of mass effect or midline shift. A small amount of  linear hypo attenuation was seen in the posterior peri sylvian fissure probably representing contrast leak with slight subarachnoid hemorrhage. The patient's general anesthesia was then reversed and the patient was extubated. The patient was able to move all fours equally. She responded to name. However, she continued to have significant expressive and comprehension difficulties. She was then transferred to ICU to continue with post thrombectomy management. IMPRESSION: Status post endovascular complete revascularization of left middle cerebral artery inferior division in the M2 distal segment with 1 pass with the 4 mm x 40 mm Solitaire X retrieval device, and a 3 mm x 33 mm Penumbra Trevo ProVue retrieval device and Penumbra aspiration achieving a TICI 3 revascularization. PLAN: Follow-up in the clinic 4 weeks post discharge. Electronically Signed   By: Luanne Bras M.D.   On: 07/18/2019 13:40   CT HEAD CODE STROKE WO CONTRAST  Result Date: 07/17/2019 CLINICAL DATA:  Code stroke. 69 year old female with aphasia. Last seen normal 2038 hours. EXAM: CT HEAD WITHOUT CONTRAST TECHNIQUE: Contiguous axial images were obtained from the base of the skull through the vertex without intravenous contrast. COMPARISON:  None. FINDINGS: Brain: Cerebral volume is within normal limits for age. No ventriculomegaly. No midline shift, mass effect,  or evidence of intracranial mass lesion. No acute intracranial hemorrhage identified. Gray-white matter differentiation appears normal in the right hemisphere, deep gray nuclei and posterior fossa. And no cytotoxic edema is identified in the left MCA territory. Vascular: Calcified atherosclerosis at the skull base. Asymmetric vascular hyperdensity at the left MCA origin and in the sylvian fissure on series 3, images 11 and 12. Skull: No acute osseous abnormality identified. Sinuses/Orbits: Visualized paranasal sinuses and mastoids are clear. Other: No acute orbit or scalp soft  tissue findings. ASPECTS [facility] Stroke Program Early CT Score) Total score (0-10 with 10 being normal): 10 IMPRESSION: 1. Positive for asymmetrically hyperdense Left MCA suspicious for emergent large vessel occlusion in this setting, but normal for age non contrast CT appearance of the brain. 2.  ASPECTS 10. 3. These results were communicated to Dr. Rory Percy at 10:01 pm on 07/17/2019 by text page via the [facility] messaging system. Electronically Signed   By: Genevie Ann M.D.   On: 07/17/2019 22:02   VAS [place] LOWER EXTREMITY VENOUS (DVT)  Result Date: 07/18/2019  Lower Venous Study Indications: Stroke, and s/p bilateral knee procedures.  Comparison Study: No prior study. Performing Technologist: Oliver Hum RVT  Examination Guidelines: A complete evaluation includes B-mode imaging, spectral Doppler, color Doppler, and power Doppler as needed of all accessible portions of each vessel. Bilateral testing is considered an integral part of a complete examination. Limited examinations for reoccurring indications may be performed as noted.  +---------+---------------+---------+-----------+----------+--------------+ RIGHT    CompressibilityPhasicitySpontaneityPropertiesThrombus Aging +---------+---------------+---------+-----------+----------+--------------+ CFV      Full           Yes      Yes                                 +---------+---------------+---------+-----------+----------+--------------+ SFJ      Full                                                        +---------+---------------+---------+-----------+----------+--------------+ FV Prox  Full                                                        +---------+---------------+---------+-----------+----------+--------------+ FV Mid   Full                                                        +---------+---------------+---------+-----------+----------+--------------+ FV DistalFull                                                         +---------+---------------+---------+-----------+----------+--------------+ PFV      Full                                                        +---------+---------------+---------+-----------+----------+--------------+  POP      Full           Yes      Yes                                 +---------+---------------+---------+-----------+----------+--------------+ PTV      Full                                                        +---------+---------------+---------+-----------+----------+--------------+ PERO     Full                                                        +---------+---------------+---------+-----------+----------+--------------+   +---------+---------------+---------+-----------+----------+--------------+ LEFT     CompressibilityPhasicitySpontaneityPropertiesThrombus Aging +---------+---------------+---------+-----------+----------+--------------+ CFV      Full           Yes      Yes                                 +---------+---------------+---------+-----------+----------+--------------+ SFJ      Full                                                        +---------+---------------+---------+-----------+----------+--------------+ FV Prox  Full                                                        +---------+---------------+---------+-----------+----------+--------------+ FV Mid   Full                                                        +---------+---------------+---------+-----------+----------+--------------+ FV DistalFull                                                        +---------+---------------+---------+-----------+----------+--------------+ PFV      Full                                                        +---------+---------------+---------+-----------+----------+--------------+ POP      Full           Yes      Yes                                  +---------+---------------+---------+-----------+----------+--------------+  PTV      Full                                                        +---------+---------------+---------+-----------+----------+--------------+ PERO     Full                                                        +---------+---------------+---------+-----------+----------+--------------+     Summary: Right: There is no evidence of deep vein thrombosis in the lower extremity. No cystic structure found in the popliteal fossa. Left: There is no evidence of deep vein thrombosis in the lower extremity. No cystic structure found in the popliteal fossa.  *See table(s) above for measurements and observations. Electronically signed by Deitra Mayo MD on 07/18/2019 at 4:10:55 PM.    Final       HISTORY OF PRESENT ILLNESS Jordan Martinez is a 69 y.o. female with past medical history of diabetes, endometrial cancer, HNPCC, hyperlipidemia, hypertension-last known normal around 8:30 PM on 07/16/2010 while having dinner with family with sudden onset of difficulty talking and unresponsiveness.  According to the EMTs, she was having dinner and had a sudden onset of an episode where she could not bring her words out. Her speech had not made much sense for a few minutes when the attending family member called 61.  On initial evaluation, EMTs thought that she had expressive aphasia.  Activated code stroke and brought the patient to the ER for an emergent stroke evaluation.  On initial assessment, NIH stroke scale was 3.  Dr Rory Percy called the healthcare power of attorney's phone number multiple times but was not able to reach him. He then called her daughter, who lives in [place], [name], explained his assessment and offered IV TPA given the disabling symptoms of aphasia, although NIH stroke scale was low.  After detailed conversation and answering all her questions, she agreed to IV TPA.  IV TPA was administered. Further  imaging, CTA head and neck was performed given the cortical nature of the symptoms.  CTA head and neck showed a mid to distal left superior division M2 occlusion with a CT perfusion study showing a 7 cc area of completed stroke and a 53 cc penumbra. Again, given low stroke scale, this was again discussed with family in detail.  The daughter wanted to wait for the healthcare power of attorney, who had possibly gone to another hospital. After explaining the emergent nature of decision making for the procedure, the procedure was explained in detail to the daughter, and after a three-way call with Dr. Estanislado Pandy, she consented to thrombectomy. The HCPOA then arrived and was able to speak with the patient. There was no chest pain, shortness of breath, nausea, vomiting.  No preceding illness sickness. Modified Rankin-0. She was taken to IR.    HOSPITAL COURSE Jordan Martinez is a 69 y.o. female with history of diabetes, endometrial cancer, HNPCC,hyperlipidemia, hypertension presenting with aphasia. Received tPA 07/17/2019 at 2207. Sent to IR for a L M2 occlusion.   Stroke: L MCA infarct due to left M2 occlusion s/p tPA and IR with resultant small SAH left sylvian fissure, infarct embolic  secondary to unknown source  Code Stroke CT head hyperdense L MCA. ASPECTS 10.     CTA head & neck L M2 MCA ELVO. L ICA 50% stenosis, mild L siphon plaque. Mild atherosclerosis throughout  CT perfusion 7 mL core, 53 mL penumbra L MCA territory   Cerebral angio TICI3 revascularization distal L M2   F/u CT mild SAH L sylvian fissure. No infarct   MRI patchy small L MCA territory infarct (L insula, L frontal operculum, L temporal lobe). Small SAHD L sylvian fissure stable.  MRA Unremarkable   2D Echo EF 65-70%. No source of embolus   LE venous doppler No DVT  Loop recorder placed 1/13 to look for AF as source of stroke ([place])  LDL 120  HgbA1c 7.0  aspirin 81 mg daily prior to admission, will start  aspirin 81 and plavix 75 at d/c. Continue DAPT x 3 weeks then aspirin alone.    Therapy recommendations: continue OP PT   Disposition:  return to [facility]  Allergic reaction to TPA  Blister/welps at administration site  Treated w/ benadryl  Hypertension  Home meds:  Lisinopril 10, atenolol 50  Stable  Resume home BP meds  BP goal 120-140 as within 24h following IR procedure and CT showing small SAH   Long-term BP goal normotensive  Hyperlipidemia  Home meds:  lipitor 20 and fish oil  Increase lipitor to 40  LDL 120, goal < 70  Continue statin at discharge  Diabetes type II Controlled  Home meds:  glucophage 500 bid  HgbA1c 7.0, goal < 7.0  CBG monitoring   SSI  PCP follow up  Endometrial cancer   S/p surgery and chemo  Currently no mets  Follow up with oncology regularly  Less likely to be hypercoagulable state at this time from cancer   B/l Knee pain  S/p bilateral knee replacement two years ago in [place] with Dr. Tommi Rumps at [facility] s/p bilateral knee ablation last month  Associated with right hip pain pending hip injection  Denies immobility  LE venous doppler no DVT  Tobacco abuse  Current smoker  Smoking cessation counseling provided  Pt is willing to quit  Other Stroke Risk Factors  Advanced age  ETOH use, alcohol level <10, advised to drink no more than 1 drink(s) a day  Obesity, Body mass index is 34.78 kg/m., recommend weight loss, diet and exercise as appropriate   Other Active Problems  GERD on PPI    DISCHARGE EXAM Blood pressure (!) 108/58, pulse 77, temperature 98.2 F (36.8 C), temperature source Oral, resp. rate 20, height 5\' 4"  (1.626 m), weight 91.9 kg, SpO2 95 %. General - Well nourished, well developed, in no apparent distress.  Ophthalmologic - fundi not visualized due to noncooperation.  Cardiovascular - Regular rhythm and rate.  Mental Status -  Level of arousal and orientation to  time, place, and person were intact. Language including expression, naming, repetition, comprehension was assessed and found intact. Fund of Knowledge was assessed and was intact.  Cranial Nerves II - XII - II - Visual field intact OU. III, IV, VI - Extraocular movements intact. V - Facial sensation intact bilaterally. VII - Facial movement intact bilaterally. VIII - Hearing & vestibular intact bilaterally. X - Palate elevates symmetrically. XI - Chin turning & shoulder shrug intact bilaterally. XII - Tongue protrusion intact.  Motor Strength - The patient's strength was normal in all extremities and pronator drift was absent.  Bulk was normal and fasciculations  were absent.   Motor Tone - Muscle tone was assessed at the neck and appendages and was normal.  Reflexes - The patient's reflexes were symmetrical in all extremities and she had no pathological reflexes  Sensory - Light touch, temperature/pinprick were assessed and were symmetrical.    Coordination - The patient had normal movements in the hands with no ataxia or dysmetria.  Tremor was absent.  Gait and Station - deferred.  Discharge Diet   hearrt healthy/carb modified thin liquids  DISCHARGE PLAN  Disposition:  Return to [place] outpatient physical therapy through [facility]  aspirin 81 mg daily and clopidogrel 75 mg daily for secondary stroke prevention for 3 weeks then aspirin alone.  Ongoing stroke risk factor control by Primary Care Physician at time of discharge  Follow-up PCP Javier Glazier, MD in 2 weeks.  Follow-up in [facility] in 4 weeks, office to schedule an appointment.  Follow-up [facility] for implantable loop recorder wound check in 10-14 days, office to schedule an appointment.  Loop recorder to be monitored by [facility] for atrial fibrillation as source of stroke. If found, they will notify patient.  35 minutes were  spent preparing discharge.  Rosalin Hawking, MD PhD Stroke Neurology 07/19/2019 11:05 PM

## 2019-07-19 NOTE — TOC Transition Note (Signed)
Transition of Care [facility]) - CM/SW Discharge Note   Patient Details  Name: Jordan Martinez MRN: [id number] Date of Birth: [birth year]  Transition of Care [facility]) CM/SW Contact:  Pollie Friar, RN Phone Number: 07/19/2019, 1:47 PM   Clinical Narrative:    Pt is from [place] and was previously doing outpatient therapy through their therapy department. PT recommending Outpatient therapy and patient wants to continue with what she was doing at [facility]. CM faxed Whitney at [facility] the orders.  Pt has friend at the bedside that will provide transport home when medically ready.   Final next level of care: OP Rehab Barriers to Discharge: No Barriers Identified   Patient Goals and CMS Choice   CMS Medicare.gov Compare Post Acute Care list provided to:: Patient Choice offered to / list presented to : Patient  Discharge Placement                       Discharge Plan and Services                                     Social Determinants of Health (SDOH) Interventions     Readmission Risk Interventions No flowsheet data found.

## 2019-07-19 NOTE — Progress Notes (Signed)
Pt back to room from procedure. Pt alert and verbally responsive; VSS; telemetry reapplied and verified; chest incision has sanguinous stain and marked. RN Gabe notified; will continue to closely.   07/19/19 1708  Vitals  Temp 98.7 F (37.1 C)  Temp Source Oral  BP (!) 102/48  MAP (mmHg) 65  BP Location Left Arm  BP Method Automatic  Patient Position (if appropriate) Lying  Pulse Rate 70  Pulse Rate Source Monitor  Resp 18  Oxygen Therapy  SpO2 99 %  O2 Device Room Air  MEWS Score  MEWS Temp 0  MEWS Systolic 0  MEWS Pulse 0  MEWS RR 0  MEWS LOC 0  MEWS Score 0  MEWS Score Color Green

## 2019-07-19 NOTE — Progress Notes (Signed)
The chaplain followed up with [employer] [employer] concerning communion.  The chaplain informed the patient that it was not likely that communion was possible while she was in the hospital.  Mateo Flow understood and expressed some anxiety about waiting to hear the results of tests that were conducted yesterday.  The chaplain has been in contact with the church and the church informed the chaplain that the communion is not possible at this time.  The chaplain will visit again to let the patient know.  Brion Aliment Chaplain Resident For questions concerning this note please contact me by pager [phone]

## 2019-07-19 NOTE — Progress Notes (Signed)
SLP Cancellation Note  Patient Details Name: Jordan Martinez MRN: [id number] DOB: 12/28/1950   Cancelled treatment:       Reason Eval/Treat Not Completed: SLP screened, no needs identified, will sign off.  SLP screened patient, no speech/language needs identified. SLP to sign off. Please re-consult should needs arise.  Marina Goodell, M.Ed., CCC-SLP Speech Therapy Acute Rehabilitation [phone]    Marina Goodell 07/19/2019, 11:22 AM

## 2019-07-20 ENCOUNTER — Other Ambulatory Visit: Payer: Self-pay

## 2019-07-20 ENCOUNTER — Telehealth: Payer: Self-pay | Admitting: Medical

## 2019-07-20 ENCOUNTER — Telehealth: Payer: Self-pay | Admitting: Physician Assistant

## 2019-07-20 ENCOUNTER — Ambulatory Visit (INDEPENDENT_AMBULATORY_CARE_PROVIDER_SITE_OTHER): Payer: Medicare Other | Admitting: *Deleted

## 2019-07-20 DIAGNOSIS — I639 Cerebral infarction, unspecified: Secondary | ICD-10-CM

## 2019-07-20 NOTE — Patient Instructions (Signed)
Leave dressing on wound site until 4 pm 07/21/19. Apply direct  pressure to area for 10 minutes if bleeding occurs. Call device clinic if you develop any bleeding that does not resolve after direct pressure. Device clinic hours are 8 am- 5 pm and contact # is [phone].

## 2019-07-20 NOTE — Telephone Encounter (Signed)
called patient to follow up on her call to the on call coverage early this AM with bleeding reported. She states that prior to leaving the hospital she had some bleeding that the RN was able to control with pressure and placed a small pressure dressing on the site prior to discharge. The patient woke early this morning to find that the dressing that was on her loop site was off and she was bleeding (she had tPa upon her admission 07/16/2018, taking her daily ASA and due to start plavix tonight s/p cerebral intervention by IR) She reports the steri-strips remain on the wound, but the dressing otherwise was off and actively bleeding.  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 She saw the nurse at [facility] who said she was concerned about swelling at the site/her chest. She is nervous about this, but feels well and without pain or complaints otherwise.  She was given a work-in appointment for 3:00PM today with our device clinic to check her site.  She will be able to get there I informed her that Ed would not be able to come up with her, and she needed to wear a mask. She was very appreciative of the follow up and appointment.  Tommye Standard, PA-C

## 2019-07-20 NOTE — Telephone Encounter (Signed)
Received a page regarding Loop Recorder dressing. This patient was admitted a couple days ago for stroke and a loop recorder was implanted yesterday right before discharge. Initially (in the hospital) she had some wound site bleeding and the nurse held pressure and it stopped. Pressure dressing was applied. She was discharged and went to her friends house to sleep. Some time over the night the wound began bleeding and the dressing came off. She woke up at 5:00 AM "drenched in blood" and the dressing was off. 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 Denies fever, chills, N/V, site tenderness, redness, irritation. No lightheadedness or dizziness, or SOB. She is feeling OK at this point.

## 2019-07-21 ENCOUNTER — Encounter (HOSPITAL_COMMUNITY): Payer: Self-pay

## 2019-07-31 ENCOUNTER — Other Ambulatory Visit: Payer: Self-pay | Admitting: *Deleted

## 2019-07-31 ENCOUNTER — Encounter: Payer: Self-pay | Admitting: *Deleted

## 2019-07-31 NOTE — Patient Outreach (Addendum)
[facility]) Care Management  07/31/2019  Jordan Martinez [birth year] [id number]   Subjective: Telephone call to patient's home / mobile number, spoke with patient, and HIPAA verified.  Discussed [facility] Care Management Medicare EMMI Stroke Red Flag Alert follow up, patient voiced understanding, and is in agreement to follow up.   Patient states she remembers receiving EMMI automated calls and the system captured the incorrect answers.  Patient states she doing well, things are going well, "stroke headaches" are getting better, and able to manage with Tylenol.  States she discussed headaches with neurologist prior to hospital discharge and she was advised by MD, headaches can  last up to 2 weeks, she is currently at 2 weeks, and can see the improvement.  Patient states she is aware of signs/ symptoms to report, how to reach provider if needed after hours, when to go to ED, and / or call 911.   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 States the only problem that she has had since discharge was that her surgical site  from insertion of loop recorder was oozing blood, she has an emergency visit with cardiologist office on 07/20/2019, dressing was changed, site was inspected, and no further signs/ /symptoms of bleeding.  She will have a follow up visit with cardiologist on 08/05/2019.  She was concerned by the delay in return call  (4 hour delay) from the cardiologist office on 07/20/2019, she advised the office of her concerns, and received excellent follow up care.   RNCM advised patient to call answering again if she has not received a call back from the on-call provider in 30 - 45 minutes, in the future, patient voiced understanding, and states she will do that if this happens in the future.  Patient states she had a follow up appointment with her primary MD on 07/26/2019, her appointment went well, and will  have another follow up in 6 weeks.   States she also has the following  appointments: with Covid clinic for 1st Covid vaccine on 126/2021 and with Eye MD on 08/30/2019.   States she has had eye strain, throughout her chemo therapy treatment for endometrial cancer,  which was completed in 10/2018,  planning to have this addressed at neurologist and Eye MD appointments.  Patient states she physical therapy through the [facility] and therapy is going well.  States she is also driving short distances as needed.  Patient states she is able to manage self care and has assistance as needed.  Patient voices understanding of medical diagnosis and treatment plan.  States she is accessing her Medicare  benefits as needed via member services number on back of card.   Patient states she does not have any education material, EMMI follow up, care coordination, care management, disease monitoring, transportation, community resource, or pharmacy needs at this time.  States she is very appreciative of the follow up, is in agreement  to receive 1 additional follow up call to assess for further CM needs, and is in agreement to receive [place] Management EMMI follow up calls as needed.     Objective: Per KPN (Knowledge Performance Now, point of care tool) and chart review, patient hospitalized 07/17/2019 - 07/19/2019 for stroke, Left MCA infarct due occlusion, status post  tPA and with small SAH sylvian fissure, infarct embolic secondary to unknown source.  Patient also has a history of diabetes, endometrial  cancer, hyperlipidemia, hypertension, bilateral knee replacement two years ago in [place], and Tobacco abuse.        Assessment: Received Medicare EMMI [place] Red Flag Alert follow up referrals on 07/31/2019 and 07/28/2019.  Red Flag Alert Triggers, times 2 on 07/31/2019, patient answered yes to the following questions: Questions/problems with meds?   Sad, hopeless, anxious, or empty?  Red Flag Alert Triggers, times 3  on 07/28/2019, patient answered yes to the following questions: Feeling worse overall?  Questions/problems with meds?  Smoked or been around smoke?    EMMI follow up completed and will follow up to assess further care management needs.      Plan: RNCM will call patient for telephone outreach attempt, within 21 business days, EMMI follow up, to assess for further CM needs, and proceed with case closure, within 10 business days if no return call.      Jordan Martinez H. Annia Friendly, BSN, [place] Management [facility] Telephonic CM Phone: [phone] Fax: [phone]

## 2019-08-03 ENCOUNTER — Ambulatory Visit (INDEPENDENT_AMBULATORY_CARE_PROVIDER_SITE_OTHER): Payer: Medicare Other | Admitting: *Deleted

## 2019-08-03 ENCOUNTER — Other Ambulatory Visit: Payer: Self-pay

## 2019-08-03 DIAGNOSIS — I63412 Cerebral infarction due to embolism of left middle cerebral artery: Secondary | ICD-10-CM

## 2019-08-03 DIAGNOSIS — Z95818 Presence of other cardiac implants and grafts: Secondary | ICD-10-CM

## 2019-08-03 LAB — CUP PACEART INCLINIC DEVICE CHECK
Date Time Interrogation Session: 20210128132635
Implantable Pulse Generator Implant Date: 20210113

## 2019-08-03 NOTE — Progress Notes (Signed)
ILR wound check in clinic. Steri strips removed. Wound well healed. Home monitor transmitting nightly. No episodes. Patient educated about Carelink monitor and incision care. Monthly summary reports and ROV with Dr. Curt Bears PRN.

## 2019-08-07 ENCOUNTER — Other Ambulatory Visit: Payer: Self-pay | Admitting: *Deleted

## 2019-08-07 NOTE — Patient Outreach (Signed)
[facility]) Care Management  08/07/2019  Jonice Lewkowicz [birth year] [id number]   Subjective: Telephone call to patient's home / mobile number, spoke with patient, and HIPAA verified.  States she remembers speaking with this RNCM in the past.   States she is doing great, on her way home from MD's office visit, also had her loop recorder checked earlier this week, and all appointments has went well.   States she remembers receiving the EMMI automated calls and the system captured the incorrect answer.  States she does not have any questions regarding her medications, had forgot to take a dose of her plavix, called her local pharmacist, was advised to take med as soon as she remembers, not to take next dose before 24 hours, and she is due to complete plavix treatment on 08/10/2019.    Patient states she does not have any education material, EMMI follow up, care coordination, care management, disease monitoring, transportation, community resource, or pharmacy needs at this time.  States she is very appreciative of the follow up, is in agreement  to receive 1 additional follow up call to assess for further CM needs, and is in agreement to receive [place] Management EMMI follow up calls as needed.       Objective: Per KPN (Knowledge Performance Now, point of care tool) and chart review, patient hospitalized 07/17/2019 - 07/19/2019 for stroke, Left MCA infarct due occlusion, status post  tPA and with small SAH sylvian fissure, infarct embolic secondary tounknownsource.  Patient also has a history of diabetes, endometrial cancer, hyperlipidemia, hypertension, bilateral knee replacement two years ago in [place], and Tobacco abuse.        Assessment: Received Medicare EMMI [place] Red Flag Alert follow up referrals on  08/04/2019, 07/31/2019 and 07/28/2019.  Red Flag Alert Trigger, Day # 13,  on 08/04/2019, patient answered yes to the following question: Questions/problems with meds?    Red Flag  Alert Triggers, Day #9, times 2 on 07/31/2019, patient answered yes to the following questions: Questions/problems with meds?   Sad, hopeless, anxious, or empty?  Red Flag Alert Triggers, Day #6, times 3 on 07/28/2019, patient answered yes to the following questions: Feeling worse overall?  Questions/problems with meds?  Smoked or been around smoke?    EMMI follow up completed and will follow up to assess further care management needs.      Plan: RNCM will call patient for telephone outreach attempt, within 21 business days, EMMI follow up, to assess for further CM needs, and proceed with case closure, within 10 business days if no return call.     Danee Soller H. Annia Friendly, BSN, [place] Management [facility] Telephonic CM Phone: [phone] Fax: [phone]

## 2019-08-10 LAB — CUP PACEART INCLINIC DEVICE CHECK
Date Time Interrogation Session: 20210114142829
Implantable Pulse Generator Implant Date: 20210112190000

## 2019-08-10 NOTE — Progress Notes (Signed)
ILR wound check in clinic. Outer dressing removed and small area of dried blood on dressing.Steri strips in place due to implant 07/19/19. Bruising around wound. Patient did receive TPA during recent hospitalization. Home monitor transmitting nightly. No episodes. Questions answered.

## 2019-08-15 ENCOUNTER — Ambulatory Visit: Payer: Self-pay | Admitting: *Deleted

## 2019-08-21 ENCOUNTER — Ambulatory Visit (INDEPENDENT_AMBULATORY_CARE_PROVIDER_SITE_OTHER): Payer: Medicare Other | Admitting: *Deleted

## 2019-08-21 DIAGNOSIS — I63412 Cerebral infarction due to embolism of left middle cerebral artery: Secondary | ICD-10-CM | POA: Diagnosis not present

## 2019-08-21 LAB — CUP PACEART REMOTE DEVICE CHECK
Date Time Interrogation Session: 20210214220158
Implantable Pulse Generator Implant Date: 20210113

## 2019-08-22 ENCOUNTER — Ambulatory Visit: Payer: Self-pay | Admitting: *Deleted

## 2019-08-22 NOTE — Progress Notes (Signed)
ILR Remote 

## 2019-08-23 ENCOUNTER — Other Ambulatory Visit: Payer: Self-pay | Admitting: *Deleted

## 2019-08-23 ENCOUNTER — Encounter: Payer: Self-pay | Admitting: Adult Health

## 2019-08-23 ENCOUNTER — Other Ambulatory Visit: Payer: Self-pay

## 2019-08-23 ENCOUNTER — Ambulatory Visit (INDEPENDENT_AMBULATORY_CARE_PROVIDER_SITE_OTHER): Payer: Medicare Other | Admitting: Adult Health

## 2019-08-23 VITALS — BP 130/70 | HR 73 | Temp 95.6°F | Ht 64.0 in | Wt 203.6 lb

## 2019-08-23 DIAGNOSIS — E119 Type 2 diabetes mellitus without complications: Secondary | ICD-10-CM

## 2019-08-23 DIAGNOSIS — G43611 Persistent migraine aura with cerebral infarction, intractable, with status migrainosus: Secondary | ICD-10-CM

## 2019-08-23 DIAGNOSIS — I10 Essential (primary) hypertension: Secondary | ICD-10-CM | POA: Diagnosis not present

## 2019-08-23 DIAGNOSIS — E785 Hyperlipidemia, unspecified: Secondary | ICD-10-CM

## 2019-08-23 DIAGNOSIS — I639 Cerebral infarction, unspecified: Secondary | ICD-10-CM | POA: Diagnosis not present

## 2019-08-23 MED ORDER — AMITRIPTYLINE HCL 25 MG PO TABS: 25.0000 mg | ORAL_TABLET | Freq: Every day | ORAL | 3 refills | Status: DC

## 2019-08-23 NOTE — Patient Outreach (Signed)
[name]) Care Management  08/23/2019  Jordan Martinez [birth year] [id number]   Subjective: Telephone call to patient's home / mobile number, spoke with patient, and HIPAA verified.  Patient states she is doing good, only able to speak for a few minutes, and is on her way to church.  States she continues to have headaches, and eye pain, not sure if this is side effect of the stroke, or chemo.  States she has discussed these issues with primary MD, neurologist,  and ophthalmologist in the past.  States she was told by neurologist during hospitalization, that the headaches and eye pain, may continue for a few weeks post stroke.  States she is now getting concerned because the headaches and eye pain, have lasted more than a few weeks.  States she was also having eye pain and headaches post chemotherapy.  States she is not sure what these symptoms are related to now or is something else going on.  RNCM encouraged patient to discuss her concerns with providers, patient in agreement, and states she will follow up.   Patient states she has an appointment with neurologist this afternoon and with ophthalmologist on 08/30/2019, planning to discuss her concerns. Patient states she is aware of signs/ symptoms to report, how to reach provider if needed after hours, when to go to ED, and / or call 911.    Patient states completed 21 day treatment regimen of Plavix and will discuss with neurologist if treatment will be continued. Patient statesshe does not have any education material, EMMI follow up, care coordination, care management, disease monitoring, transportation, community resource, or pharmacy needs at this time.[name] is very appreciative of the follow up, is in agreement to receive 1 additional follow up call to assess for further CM needs, and is in agreement to receive [place] Management EMMI follow up calls as needed.     Objective:Per KPN (Knowledge Performance Now, point of care  tool) and chart review,patient hospitalized 07/17/2019 - 07/19/2019 for stroke, Left MCA infarct due occlusion, status post tPA and with small SAH sylvian fissure, infarct embolic secondary tounknownsource. Patient also has a history of diabetes, endometrial cancer, hyperlipidemia, hypertension[place] and[place].      Assessment: Received Medicare EMMI [place] Red Flag Alert follow up referrals on  08/04/2019, 07/31/2019 and 07/28/2019. Red Flag Alert Trigger, Day # 13,  on 08/04/2019, patient answered yes to the following question: Questions/problems with meds?    Red Flag Alert Triggers, Day #9, times 2 on 07/31/2019, patient answered yes to the following questions: Questions/problems with meds?Sad, hopeless, anxious, or empty?Red Flag Alert Triggers, Day #6, times 3 on 07/28/2019, patient answered yes to the following questions:Feeling worse overall?Questions/problems with meds?Smoked or been around smoke?EMMI follow up completed and will follow up to assess further care management needs.      Plan:RNCM will call patient for telephone outreach attempt, within10business days, EMMI follow up, to assess for further CM needs, and proceed with case closure, within 10 business days if no return call.     Janese Radabaugh H. Annia Friendly, BSN, [place] Management [facility] Telephonic CM Phone: [phone] Fax: [phone]

## 2019-08-23 NOTE — Progress Notes (Signed)
[facility] [address] [place]. [place] [address] [phone]       HOSPITAL FOLLOW UP NOTE  [name] Date of Birth:  [birth year] Medical Record Number:  [id number]   Reason for Referral:  hospital stroke follow up    CHIEF COMPLAINT:  Chief Complaint  Patient presents with  . Hospitalization Follow-up    Alone. Treatment room. Patient mentioned that she has been having some headaches with eye pain.     HPI: Jordan Martinez being seen today for in office hospital follow-up regarding left MCA stroke on 07/17/2019.  History obtained from patient and chart review. Reviewed all radiology images and labs personally.  Ms.Jordan Martinez a 69 y.o.femalewith history of diabetes, endometrial cancer, HNPCC,hyperlipidemia, hypertensionwho presented on 07/17/2019 with aphasia.  Evaluated by stroke team and  Dr. Erlinda Hong with stroke work-up revealing left MCA infarct due to left M2 occlusion s/p TPA and IR with TICI 3 revascularization of distal left M2 with resultant small SAH left sylvian fissure, infarct embolic secondary to unknown source.  2D echo unremarkable.  LE venous Doppler negative.  Loop recorder placed to rule out A. fib as potential stroke etiology.  Recommended DAPT for 3 weeks and aspirin alone.  Initiated atorvastatin 40 mg daily with LDL 120.  HTN stable.  Controlled DM with A1c 7.0.  Current tobacco use with smoking cessation counseling provided.  Other stroke risk factors include advanced age, EtOH use and obesity but no prior history of stroke.  Discharged back to [facility] with recommendation of physical therapy.  [name] is a 69 year old female who is being seen today for hospital follow up.  She is a retired Marine scientist.  Recovered well from a stroke standpoint without residual speech deficits.  Prior history of frontal headaches worsened with eyestrain and was told due to prior chemo treatments.  Headache intensity worsened  post stroke gradually returning to baseline but continues to experience frequently.  At times she also experienced cloudiness but this has been unchanged.  She will be seeing her ophthalmologist next week for 1 year follow-up.  Describes headaches as a pressure sensation in frontal region and behind eyes bilaterally associated with photophobia and phonophobia but denies nausea/vomiting.  She will occasionally experience small flicker of light in her periphery prior to headache onset and is unsure if headaches are worse when she experiences visual aura.  She has not previously been treated for headaches or been diagnosed with migraine headaches.  She also endorses increased anxiety due to fear of recurrent stroke.  She has completed 3 weeks DAPT and continues on aspirin alone without bleeding or bruising.  Continues on atorvastatin 40 mg daily without myalgias.  Blood pressure today satisfactory at 130/70.  She has not had follow-up with IR as recommended post revascularization during admission.  Loop recorder is not shown atrial fibrillation thus far.  No further concerns at this time.    ROS:   14 system review of systems performed and negative with exception of headaches, anxiety  PMH:  Past Medical History:  Diagnosis Date  . Anxiety   . Arthritis   . Cataract    bilatereral cat. ext. with lens implant  . Depression   . Diabetes mellitus without complication (HCC)    diet controlled. States always has borderline HgbA1C  . Endometrial cancer ([place])   . Family history of colon cancer   . Family history of genetic mutation for hereditary nonpolyposis colorectal cancer (HNPCC)   . GERD (  gastroesophageal reflux disease)   . High cholesterol   . History of kidney stones    years   . Hypertension   . Neuropathy   . Sensation of pressure in bladder area   . Stroke ([place]) 07/17/2019  . Uterine cramping     PSH:  Past Surgical History:  Procedure Laterality Date  . burnt the nerves  Right  04/29/2018   in right knee   . burnt the nerves  Left 04/22/2018   left knee nerve   . cystoscopy kidney stone   1985  . IR CT HEAD LTD  07/18/2019  . IR IMAGING GUIDED PORT INSERTION  06/15/2018  . IR PERCUTANEOUS ART THROMBECTOMY/INFUSION INTRACRANIAL INC DIAG ANGIO  07/18/2019  . IR REMOVAL TUN ACCESS W/ PORT W/O FL MOD SED  12/01/2018  . LOOP RECORDER INSERTION N/A 07/19/2019   Procedure: LOOP RECORDER INSERTION;  Surgeon: Constance Haw, MD;  Location: [facility];  Service: Cardiovascular;  Laterality: N/A;  . RADIOLOGY WITH ANESTHESIA N/A 07/17/2019   Procedure: IR WITH ANESTHESIA;  Surgeon: Luanne Bras, MD;  Location: [place];  Service: Radiology;  Laterality: N/A;  . REPLACEMENT TOTAL KNEE BILATERAL Bilateral 2018   Right x 2. 2019 undergoing intermittent injection "RFA" procedures with Dr. Sheliah Mends [facility]  . REVISION TOTAL KNEE ARTHROPLASTY Right 03/2017  . ROBOTIC ASSISTED TOTAL HYSTERECTOMY WITH BILATERAL SALPINGO OOPHERECTOMY N/A 05/03/2018   Procedure: XI ROBOTIC ASSISTED TOTAL HYSTERECTOMY WITH BILATERAL SALPINGO OOPHORECTOMY;  Surgeon: Everitt Amber, MD;  Location: [facility];  Service: Gynecology;  Laterality: N/A;  . SENTINEL NODE BIOPSY N/A 05/03/2018   Procedure: SENTINEL NODE BIOPSY;  Surgeon: Everitt Amber, MD;  Location: [facility];  Service: Gynecology;  Laterality: N/A;  . TONSILLECTOMY AND ADENOIDECTOMY    . TUBAL LIGATION      Social History:  Social History   Socioeconomic History  . Marital status: Divorced    Spouse name: Not on file  . Number of children: 2  . Years of education: Not on file  . Highest education level: Not on file  Occupational History  . Occupation: retired Marine scientist  Tobacco Use  . Smoking status: Current Every Day Smoker    Packs/day: 0.50    Years: 40.00    Pack years: 20.00    Types: Cigarettes  . Smokeless tobacco: Never Used  . Tobacco comment: 4-5 loose cigarettes per day , over 20 year hx of smoking   Substance  and Sexual Activity  . Alcohol use: Yes    Comment: 2 glasses wine daily.   . Drug use: Never  . Sexual activity: Not Currently    Birth control/protection: None  Other Topics Concern  . Not on file  Social History Narrative  . Not on file   Social Determinants of Health   Financial Resource Strain:   . Difficulty of Paying Living Expenses: Not on file  Food Insecurity:   . Worried About Charity fundraiser in the Last Year: Not on file  . Ran Out of Food in the Last Year: Not on file  Transportation Needs:   . Lack of Transportation (Medical): Not on file  . Lack of Transportation (Non-Medical): Not on file  Physical Activity:   . Days of Exercise per Week: Not on file  . Minutes of Exercise per Session: Not on file  Stress:   . Feeling of Stress : Not on file  Social Connections:   . Frequency of Communication with Friends and  Family: Not on file  . Frequency of Social Gatherings with Friends and Family: Not on file  . Attends Religious Services: Not on file  . Active Member of Clubs or Organizations: Not on file  . Attends Archivist Meetings: Not on file  . Marital Status: Not on file  Intimate Partner Violence:   . Fear of Current or Ex-Partner: Not on file  . Emotionally Abused: Not on file  . Physically Abused: Not on file  . Sexually Abused: Not on file    Family History:  Family History  Adopted: Yes  Problem Relation Age of Onset  . Colon cancer Daughter 18       Lynch syndrome - MSH6 +  . Healthy Son     Medications:   Current Outpatient Medications on File Prior to Visit  Medication Sig Dispense Refill  . acetaminophen (TYLENOL) 500 MG tablet Take 1,000 mg by mouth every 6 (six) hours as needed for mild pain or moderate pain.    Marland Kitchen amoxicillin (AMOXIL) 500 MG capsule Take 2,000 mg by mouth once as needed (dental work).    Marland Kitchen aspirin EC 81 MG tablet Take 81 mg by mouth daily.    Marland Kitchen atenolol (TENORMIN) 50 MG tablet Take 75 mg by mouth daily.      Marland Kitchen atorvastatin (LIPITOR) 40 MG tablet Take 1 tablet (40 mg total) by mouth at bedtime. 30 tablet 2  . Biotin 5000 MCG TABS Take 5,000 mcg by mouth daily.    . cholecalciferol (VITAMIN D) 400 units TABS tablet Take 400 Units by mouth daily.    . cyclobenzaprine (FLEXERIL) 5 MG tablet Take 1 tablet (5 mg total) by mouth 3 (three) times daily. (Patient taking differently: Take 5-10 mg by mouth at bedtime as needed for muscle spasms. ) 90 tablet 1  . lisinopril (ZESTRIL) 10 MG tablet Take 10 mg by mouth daily.    . metFORMIN (GLUCOPHAGE) 500 MG tablet TAKE 1 TABLET BY MOUTH TWICE A DAY WITH MEALS 60 tablet 1  . Omega-3 Fatty Acids (FISH OIL) 1200 MG CAPS Take 1,200 mg by mouth daily.     Marland Kitchen omeprazole (PRILOSEC) 40 MG capsule Take 1 capsule (40 mg total) by mouth daily. 90 capsule 3  . Propylene Glycol (SYSTANE BALANCE) 0.6 % SOLN Place 2 drops into both eyes 2 (two) times daily.     Marland Kitchen saccharomyces boulardii (FLORASTOR) 250 MG capsule Take 250 mg by mouth 2 (two) times daily as needed (for one week following dental procedure).    . traMADol (ULTRAM) 50 MG tablet Take 50-100 mg by mouth at bedtime as needed for pain.     No current facility-administered medications on file prior to visit.    Allergies:   Allergies  Allergen Reactions  . Amlodipine     Blurred vision   . Compazine [Prochlorperazine Edisylate]     Stroke symptoms,  . Gabapentin     Blurred vision and nystagmus  . Lyrica [Pregabalin]     Blurred Vision   . Sertraline Other (See Comments)    Visual disturbances.   . Chlorhexidine Rash    Blisters and redness to chest     Physical Exam  Vitals:   08/23/19 1305  BP: 130/70  Pulse: 73  Temp: (!) 95.6 F (35.3 C)  TempSrc: Oral  Weight: 203 lb 9.6 oz (92.4 kg)  Height: 5' 4" (1.626 m)   Body mass index is 34.95 kg/m. No exam data present  General: well  developed, well nourished, pleasant middle-age Caucasian female, seated, in no evident distress Head: head  normocephalic and atraumatic.   Neck: supple with no carotid or supraclavicular bruits Cardiovascular: regular rate and rhythm, no murmurs Musculoskeletal: no deformity Skin:  no rash/petichiae Vascular:  Normal pulses all extremities   Neurologic Exam Mental Status: Awake and fully alert.   Normal speech and language.  Oriented to place and time. Recent and remote memory intact. Attention span, concentration and fund of knowledge appropriate. Mood and affect appropriate.  Cranial Nerves: Fundoscopic exam reveals sharp disc margins. Pupils equal, briskly reactive to light. Extraocular movements full without nystagmus. Visual fields full to confrontation. Hearing intact. Facial sensation intact. Face, tongue, palate moves normally and symmetrically.  Motor: Normal bulk and tone. Normal strength in all tested extremity muscles. Sensory.: intact to touch , pinprick , position and vibratory sensation.  Coordination: Rapid alternating movements normal in all extremities. Finger-to-nose and heel-to-shin performed accurately bilaterally. Gait and Station: Arises from chair without difficulty. Stance is normal. Gait demonstrates normal stride length and balance Reflexes: 1+ and symmetric. Toes downgoing.     NIHSS  0 Modified Rankin  0    Diagnostic Data (Labs, Imaging, Testing)   Code Stroke CT headhyperdense L MCA.ASPECTS 10.   CTA head & neckL M2 MCA ELVO. L ICA 50% stenosis,mild L siphon plaque. Mild atherosclerosis throughout  CT perfusion7 mL core, 53 mL penumbra L MCA territory   Cerebral angio TICI3 revascularization distal L M2   F/u CT mild SAH L sylvian fissure. No infarct  MRIpatchy small L MCA territory infarct (L insula, L frontal operculum, L temporal lobe). Small SAHD L sylvian fissure stable.  MRA Unremarkable   2D EchoEF 65-70%. No source of embolus   LE venous dopplerNo DVT  Loop recorder placed 1/13 to look for AF as source of stroke  [facility])  [id number]  HgbA1c7.0    ASSESSMENT: Sydney Hasten is a 69 y.o. year old female presented with aphasia on 07/17/2019 with stroke work-up revealing left MCA infarct due to left M2 occlusion status post TPA and IR with resultant small SAH left sylvian fissure secondary to unknown source. Vascular risk factors include history of endometrial cancer with completion of chemo, HTN, HLD, DM, L ICA 50% stenosis.  Recovered well from a stroke standpoint without residual deficits.  Chronic frontal headaches worsened post stroke but have been gradually improving     PLAN:  1. Cryptogenic left MCA stroke : Continue aspirin 81 mg daily  and atorvastatin 40 mg daily for secondary stroke prevention.  Continue to monitor loop recorder for possible atrial fibrillation.  Maintain strict control of hypertension with blood pressure goal below 130/90, diabetes with hemoglobin A1c goal below 6.5% and cholesterol with LDL cholesterol (bad cholesterol) goal below 70 mg/dL.  I also advised the patient to eat a healthy diet with plenty of whole grains, cereals, fruits and vegetables, exercise regularly with at least 30 minutes of continuous activity daily and maintain ideal body weight. 2. HTN: Advised to continue current treatment regimen.  Today's BP stable.  Advised to continue to monitor at home along with continued follow-up with PCP for management 3. HLD: Advised to continue current treatment regimen along with continued follow-up with PCP for future prescribing and monitoring of lipid panel 4. DMII: Advised to continue to monitor glucose levels at home along with continued follow-up with PCP for management and monitoring 5. Migraine headaches: Recommend initiating amitriptyline 25 mg nightly for dual benefit of migraine with possible  mix of tension headaches as well as benefit for increased anxiety post stroke.  Discussion regarding possible side effects with additional information provided and patient  verbalized understanding.  Advised to call office with potential need of increase 6. Left MCA s/p revascularization: Provided IR office number to follow-up with Dr. Estanislado Pandy as recommended    Follow up in 3 months or call earlier if needed   Greater than 50% of time during this 45 minute visit was spent on counseling, explanation of diagnosis of left MCA stroke, reviewing risk factor management of HTN, HLD, DM and carotid stenosis, discussion regarding prior chronic headaches felt secondary to chemo slightly worsening post stroke, planning of further management along with potential future management, and discussion with patient and family answering all questions.    Frann Rider, AGNP-BC  [facility] [address] [place], [place] [address]  Phone [phone] Fax [phone] Note: This document was prepared with digital dictation and possible smart phrase technology. Any transcriptional errors that result from this process are unintentional.

## 2019-08-23 NOTE — Patient Instructions (Addendum)
Continue aspirin 81 mg daily  and lipitor  for secondary stroke prevention  Continue to follow up with PCP regarding cholesterol, blood pressure and diabetes management   Trial use of amitriptyline for headache management - can also help with anxiety, depression, and sleep  Follow up with eye doctor for monitoring   Please call to schedule visit with Dr. Estanislado Pandy for follow up  [facility] [address] [place], [place] [address] [phone]  Continue to monitor blood pressure at home  Maintain strict control of hypertension with blood pressure goal below 130/90, diabetes with hemoglobin A1c goal below 6.5% and cholesterol with LDL cholesterol (bad cholesterol) goal below 70 mg/dL. I also advised the patient to eat a healthy diet with plenty of whole grains, cereals, fruits and vegetables, exercise regularly and maintain ideal body weight.  Followup in the future with me in 3 months or call earlier if needed       Thank you for coming to see [place] at [facility]. I hope we have been able to provide you high quality care today.  You may receive a patient satisfaction survey over the next few weeks. We would appreciate your feedback and comments so that we may continue to improve ourselves and the health of our patients.     Amitriptyline tablets What is this medicine? AMITRIPTYLINE (a mee TRIP ti leen) is used to treat depression. This medicine may be used for other purposes; ask your health care provider or pharmacist if you have questions. COMMON BRAND NAME(S): Elavil, Vanatrip What should I tell my health care provider before I take this medicine? They need to know if you have any of these conditions:  an alcohol problem  asthma, difficulty breathing  bipolar disorder or schizophrenia  difficulty passing urine, prostate trouble  glaucoma  heart disease or previous heart attack  liver disease  over active  thyroid  seizures  thoughts or plans of suicide, a previous suicide attempt, or family history of suicide attempt  an unusual or allergic reaction to amitriptyline, other medicines, foods, dyes, or preservatives  pregnant or trying to get pregnant  breast-feeding How should I use this medicine? Take this medicine by mouth with a drink of water. Follow the directions on the prescription label. You can take the tablets with or without food. Take your medicine at regular intervals. Do not take it more often than directed. Do not stop taking this medicine suddenly except upon the advice of your doctor. Stopping this medicine too quickly may cause serious side effects or your condition may worsen. A special MedGuide will be given to you by the pharmacist with each prescription and refill. Be sure to read this information carefully each time. Talk to your pediatrician regarding the use of this medicine in children. Special care may be needed. Overdosage: If you think you have taken too much of this medicine contact a poison control center or emergency room at once. NOTE: This medicine is only for you. Do not share this medicine with others. What if I miss a dose? If you miss a dose, take it as soon as you can. If it is almost time for your next dose, take only that dose. Do not take double or extra doses. What may interact with this medicine? Do not take this medicine with any of the following medications:  arsenic trioxide  certain medicines used to regulate abnormal heartbeat or to treat other heart conditions  cisapride  droperidol  halofantrine  linezolid  MAOIs like Carbex, Eldepryl, Marplan, Nardil, and Parnate  methylene blue  other medicines for mental depression  phenothiazines like perphenazine, thioridazine and chlorpromazine  pimozide  probucol  procarbazine  sparfloxacin  St. John's Wort This medicine may also interact with the following  medications:  atropine and related drugs like hyoscyamine, scopolamine, tolterodine and others  barbiturate medicines for inducing sleep or treating seizures, like phenobarbital  cimetidine  disulfiram  ethchlorvynol  thyroid hormones such as levothyroxine  ziprasidone This list may not describe all possible interactions. Give your health care provider a list of all the medicines, herbs, non-prescription drugs, or dietary supplements you use. Also tell them if you smoke, drink alcohol, or use illegal drugs. Some items may interact with your medicine. What should I watch for while using this medicine? Tell your doctor if your symptoms do not get better or if they get worse. Visit your doctor or health care professional for regular checks on your progress. Because it may take several weeks to see the full effects of this medicine, it is important to continue your treatment as prescribed by your doctor. Patients and their families should watch out for new or worsening thoughts of suicide or depression. Also watch out for sudden changes in feelings such as feeling anxious, agitated, panicky, irritable, hostile, aggressive, impulsive, severely restless, overly excited and hyperactive, or not being able to sleep. If this happens, especially at the beginning of treatment or after a change in dose, call your health care professional. Dennis Bast may get drowsy or dizzy. Do not drive, use machinery, or do anything that needs mental alertness until you know how this medicine affects you. Do not stand or sit up quickly, especially if you are an older patient. This reduces the risk of dizzy or fainting spells. Alcohol may interfere with the effect of this medicine. Avoid alcoholic drinks. Do not treat yourself for coughs, colds, or allergies without asking your doctor or health care professional for advice. Some ingredients can increase possible side effects. Your mouth may get dry. Chewing sugarless gum or sucking  hard candy, and drinking plenty of water will help. Contact your doctor if the problem does not go away or is severe. This medicine may cause dry eyes and blurred vision. If you wear contact lenses you may feel some discomfort. Lubricating drops may help. See your eye doctor if the problem does not go away or is severe. This medicine can cause constipation. Try to have a bowel movement at least every 2 to 3 days. If you do not have a bowel movement for 3 days, call your doctor or health care professional. This medicine can make you more sensitive to the sun. Keep out of the sun. If you cannot avoid being in the sun, wear protective clothing and use sunscreen. Do not use sun lamps or tanning beds/booths. What side effects may I notice from receiving this medicine? Side effects that you should report to your doctor or health care professional as soon as possible:  allergic reactions like skin rash, itching or hives, swelling of the face, lips, or tongue  anxious  breathing problems  changes in vision  confusion  elevated mood, decreased need for sleep, racing thoughts, impulsive behavior  eye pain  fast, irregular heartbeat  feeling faint or lightheaded, falls  feeling agitated, angry, or irritable  fever with increased sweating  hallucination, loss of contact with reality  seizures  stiff muscles  suicidal thoughts or other mood changes  tingling, pain,  or numbness in the feet or hands  trouble passing urine or change in the amount of urine  trouble sleeping  unusually weak or tired  vomiting  yellowing of the eyes or skin Side effects that usually do not require medical attention (report to your doctor or health care professional if they continue or are bothersome):  change in sex drive or performance  change in appetite or weight  constipation  dizziness  dry mouth  nausea  tired  tremors  upset stomach This list may not describe all possible side  effects. Call your doctor for medical advice about side effects. You may report side effects to FDA at 1-800-FDA-1088. Where should I keep my medicine? Keep out of the reach of children. Store at room temperature between 20 and 25 degrees C (68 and 77 degrees F). Throw away any unused medicine after the expiration date. NOTE: This sheet is a summary. It may not cover all possible information. If you have questions about this medicine, talk to your doctor, pharmacist, or health care provider.  2020 Elsevier/Gold Standard (2018-06-14 13:04:32)

## 2019-08-24 ENCOUNTER — Other Ambulatory Visit (HOSPITAL_COMMUNITY): Payer: Self-pay | Admitting: Interventional Radiology

## 2019-08-24 DIAGNOSIS — I6330 Cerebral infarction due to thrombosis of unspecified cerebral artery: Secondary | ICD-10-CM

## 2019-08-24 NOTE — Progress Notes (Signed)
I agree with the above plan 

## 2019-08-30 DIAGNOSIS — H43813 Vitreous degeneration, bilateral: Secondary | ICD-10-CM | POA: Insufficient documentation

## 2019-08-31 ENCOUNTER — Ambulatory Visit (HOSPITAL_COMMUNITY)
Admission: RE | Admit: 2019-08-31 | Discharge: 2019-08-31 | Disposition: A | Discharge status: Home / Self Care | Payer: Medicare Other | Source: Ambulatory Visit | Attending: Interventional Radiology | Admitting: Interventional Radiology

## 2019-08-31 ENCOUNTER — Other Ambulatory Visit: Payer: Self-pay

## 2019-08-31 DIAGNOSIS — I6330 Cerebral infarction due to thrombosis of unspecified cerebral artery: Secondary | ICD-10-CM

## 2019-08-31 NOTE — Consult Note (Signed)
Chief Complaint: Patient was seen today for routine 4 week stroke follow up.   Referring Physician(s): Antony Contras, MD  Supervising Physician: Luanne Bras  Patient Status: [facility] - Out-pt  History of Present Illness: Jordan Martinez is a 69 y.o. female with a past medical history as below, with pertinent past medical history including arthritis, bilateral cataracts, anxiety, depression, DM, GERD, endometrial cancer, HTN, HLD, neuropathy and CVA who presents today for a routine 4 week follow up. Jordan Martinez presented to [facility] ED on 07/17/19 with complaints of acute onset aphasia and headache. Code stroke was activated and IV tPA was given. CT head w/o contrast showed asymmetrical hyperdense left MCA suspicious for emergent large vessel occlusion, follow CT cerebral perfusion with contrast confirmed emergent large vessel occlusion of a dominant posterior left M2 branch about 12 mm from the left MCA trifurcation; additionally noted was a superimposed 50% stenosis of the left ICA origin due to calcified plaque, mild left siphon plaque without stenosis and torturous proximal great vessels. She was taken to [facility] and underwent mechanical thrombectomy of the occluded distal M2 segment of the inferior division achieving TICI 3 revascularization. 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 She improved greatly and was ultimately discharged to SNF on 07/19/19 with instructions to continue Plavix 75 mg QD x 3 weeks and then then ASA 81 mg QD alone. She presents today for a routine 4 week follow up.  Jordan Martinez presents today with her husband. She reports that she continues to be forgetful especially with numbers - such as her ATM pin, the code to unlock her cell phone, some phone numbers, etc. She is able to recall her cell phone number and her husband's cell phone number today without difficulty. She is able to recall what she  had for dinner and breakfast. She states that she had very severe headaches upon returning home from the hospital - she has had migraines in the past that were "pounding" and pulsatile in nature however these headaches are not similar at all to her previous migraines. She describes the HA as being on both sides of her forehead and behind both eyes with no pounding/pulsatile nature, she does not endorse any other neurologic symptoms during these headaches. She states that at baseline the headaches are constant and 5/10 with acute periods of worsening to 10+/10, however she was recently started on amitriptyline by neurology and today is the first day that she has had improvement in her symptoms - she rates her headache as 1/10 today.   Past Medical History:  Diagnosis Date  . Anxiety   . Arthritis   . Cataract    bilatereral cat. ext. with lens implant  . Depression   . Diabetes mellitus without complication (HCC)    diet controlled. States always has borderline HgbA1C  . Endometrial cancer ([place])   . Family history of colon cancer   . Family history of genetic mutation for hereditary nonpolyposis colorectal cancer (HNPCC)   . GERD (gastroesophageal reflux disease)   . High cholesterol   . History of kidney stones    years   . Hypertension   . Neuropathy   . Sensation of pressure in bladder area   . Stroke ([place]) 07/17/2019  . Uterine cramping     Past Surgical History:  Procedure Laterality Date  . burnt the nerves  Right 04/29/2018   in right knee   .  burnt the nerves  Left 04/22/2018   left knee nerve   . cystoscopy kidney stone   1985  . IR CT HEAD LTD  07/18/2019  . IR IMAGING GUIDED PORT INSERTION  06/15/2018  . IR PERCUTANEOUS ART THROMBECTOMY/INFUSION INTRACRANIAL INC DIAG ANGIO  07/18/2019  . IR REMOVAL TUN ACCESS W/ PORT W/O FL MOD SED  12/01/2018  . LOOP RECORDER INSERTION N/A 07/19/2019   Procedure: LOOP RECORDER INSERTION;  Surgeon: Constance Haw, MD;  Location: [place] [facility];  Service: Cardiovascular;  Laterality: N/A;  . RADIOLOGY WITH ANESTHESIA N/A 07/17/2019   Procedure: IR WITH ANESTHESIA;  Surgeon: Luanne Bras, MD;  Location: [place];  Service: Radiology;  Laterality: N/A;  . REPLACEMENT TOTAL KNEE BILATERAL Bilateral 2018   Right x 2. 2019 undergoing intermittent injection "RFA" procedures with Dr. Sheliah Mends [facility]  . REVISION TOTAL KNEE ARTHROPLASTY Right 03/2017  . ROBOTIC ASSISTED TOTAL HYSTERECTOMY WITH BILATERAL SALPINGO OOPHERECTOMY N/A 05/03/2018   Procedure: XI ROBOTIC ASSISTED TOTAL HYSTERECTOMY WITH BILATERAL SALPINGO OOPHORECTOMY;  Surgeon: Everitt Amber, MD;  Location: [facility];  Service: Gynecology;  Laterality: N/A;  . SENTINEL NODE BIOPSY N/A 05/03/2018   Procedure: SENTINEL NODE BIOPSY;  Surgeon: Everitt Amber, MD;  Location: [facility];  Service: Gynecology;  Laterality: N/A;  . TONSILLECTOMY AND ADENOIDECTOMY    . TUBAL LIGATION      Allergies: Amlodipine, Compazine [prochlorperazine edisylate], Gabapentin, Lyrica [pregabalin], Sertraline, and Chlorhexidine  Medications: Prior to Admission medications   Medication Sig Start Date End Date Taking? Authorizing Provider  acetaminophen (TYLENOL) 500 MG tablet Take 1,000 mg by mouth every 6 (six) hours as needed for mild pain or moderate pain.    [provider]  amitriptyline (ELAVIL) 25 MG tablet Take 1 tablet (25 mg total) by mouth at bedtime. 08/23/19   Frann Rider, NP  amoxicillin (AMOXIL) 500 MG capsule Take 2,000 mg by mouth once as needed (dental work).    [provider]  aspirin EC 81 MG tablet Take 81 mg by mouth daily.    [provider]  atenolol (TENORMIN) 50 MG tablet Take 75 mg by mouth daily. 07/26/19   [provider]  atorvastatin (LIPITOR) 40 MG tablet Take 1 tablet (40 mg total) by mouth at bedtime. 07/19/19   Donzetta Starch, NP  Biotin 5000 MCG TABS Take 5,000 mcg by mouth daily.    [provider]    cholecalciferol (VITAMIN D) 400 units TABS tablet Take 400 Units by mouth daily.    [provider]  cyclobenzaprine (FLEXERIL) 5 MG tablet Take 1 tablet (5 mg total) by mouth 3 (three) times daily. Patient taking differently: Take 5-10 mg by mouth at bedtime as needed for muscle spasms.  09/09/18   Heath Lark, MD  lisinopril (ZESTRIL) 10 MG tablet Take 10 mg by mouth daily.    [provider]  metFORMIN (GLUCOPHAGE) 500 MG tablet TAKE 1 TABLET BY MOUTH TWICE A DAY WITH MEALS 05/05/19   Magrinat, Virgie Dad, MD  Omega-3 Fatty Acids (FISH OIL) 1200 MG CAPS Take 1,200 mg by mouth daily.     [provider]  omeprazole (PRILOSEC) 40 MG capsule Take 1 capsule (40 mg total) by mouth daily. 12/09/18   Thornton Park, MD  Propylene Glycol (SYSTANE BALANCE) 0.6 % SOLN Place 2 drops into both eyes 2 (two) times daily.     [provider]  saccharomyces boulardii (FLORASTOR) 250 MG capsule Take 250 mg by mouth 2 (  two) times daily as needed (for one week following dental procedure).    [provider]  traMADol (ULTRAM) 50 MG tablet Take 50-100 mg by mouth at bedtime as needed for pain. 05/28/19   [provider]     Family History  Adopted: Yes  Problem Relation Age of Onset  . Colon cancer Daughter 42       Lynch syndrome - MSH6 +  . Healthy Son     Social History   Socioeconomic History  . Marital status: Divorced    Spouse name: Not on file  . Number of children: 2  . Years of education: Not on file  . Highest education level: Not on file  Occupational History  . Occupation: retired Marine scientist  Tobacco Use  . Smoking status: Current Every Day Smoker    Packs/day: 0.50    Years: 40.00    Pack years: 20.00    Types: Cigarettes  . Smokeless tobacco: Never Used  . Tobacco comment: 4-5 loose cigarettes per day , over 20 year hx of smoking   Substance and Sexual Activity  . Alcohol use: Yes    Comment: 2 glasses wine daily.   . Drug use:  Never  . Sexual activity: Not Currently    Birth control/protection: None  Other Topics Concern  . Not on file  Social History Narrative  . Not on file   Social Determinants of Health   Financial Resource Strain:   . Difficulty of Paying Living Expenses: Not on file  Food Insecurity:   . Worried About Charity fundraiser in the Last Year: Not on file  . Ran Out of Food in the Last Year: Not on file  Transportation Needs:   . Lack of Transportation (Medical): Not on file  . Lack of Transportation (Non-Medical): Not on file  Physical Activity:   . Days of Exercise per Week: Not on file  . Minutes of Exercise per Session: Not on file  Stress:   . Feeling of Stress : Not on file  Social Connections:   . Frequency of Communication with Friends and Family: Not on file  . Frequency of Social Gatherings with Friends and Family: Not on file  . Attends Religious Services: Not on file  . Active Member of Clubs or Organizations: Not on file  . Attends Archivist Meetings: Not on file  . Marital Status: Not on file     Review of Systems: A 12 point ROS discussed and pertinent positives are indicated in the HPI above.  All other systems are negative.  Review of Systems  Constitutional: Negative for chills and fever.  HENT: Negative for hearing loss and trouble swallowing.   Eyes: Negative for photophobia, pain and visual disturbance.  Respiratory: Negative for cough and shortness of breath.   Cardiovascular: Negative for chest pain.  Gastrointestinal: Negative for nausea and vomiting.  Neurological: Positive for headaches. Negative for dizziness, tremors, seizures, syncope, facial asymmetry, speech difficulty, weakness, light-headedness and numbness.       (+) difficulty remembering numbers    Vital Signs: There were no vitals taken for this visit.  Physical Exam Constitutional:      General: She is not in acute distress. HENT:     Head: Normocephalic.  Pulmonary:      Effort: Pulmonary effort is normal.  Skin:    General: Skin is warm and dry.  Neurological:     General: No focal deficit present.     Mental  Status: She is alert and oriented to person, place, and time.  Psychiatric:        Mood and Affect: Mood normal.        Behavior: Behavior normal.        Thought Content: Thought content normal.        Judgment: Judgment normal.      Imaging: CUP PACEART INCLINIC DEVICE CHECK  Result Date: 08/03/2019 ILR wound check in clinic. Steri strips removed. Wound well healed. Home monitor transmitting nightly. No episodes. Patient educated about Carelink monitor and incision care. Monthly summary reports and ROV with WC PRN.Levander Campion BSN, RN, CCDS  CUP PACEART REMOTE DEVICE CHECK  Result Date: 08/21/2019 Carelink summary report received. Battery status OK. Normal device function. No new symptom episodes, tachy episodes, brady, or pause episodes. No new AF episodes. Monthly summary reports and ROV/PRN [facility]   Labs:  CBC: Recent Labs    11/23/18 0930 11/23/18 0930 12/01/18 0832 07/17/19 2152 07/17/19 2157 07/18/19 0601  WBC 7.0  --  6.1 10.4  --  7.2  HGB 12.9   < > 13.5 15.3* 16.0* 12.9  HCT 38.6   < > 40.8 46.8* 47.0* 38.3  PLT 184  --  355 247  --  211   < > = values in this interval not displayed.    COAGS: Recent Labs    12/01/18 0832 07/17/19 2152  INR 0.8 0.9  APTT  --  23*    BMP: Recent Labs    10/26/18 0900 10/26/18 0900 11/23/18 0930 07/17/19 2152 07/17/19 2157 07/18/19 0601  NA 142   < > 140 143 142 141  K 3.9   < > 4.1 4.1 3.5 4.1  CL 105   < > 105 109 108 110  CO2 23  --  23 22  --  23  GLUCOSE 135*   < > 134* 127* 123* 204*  BUN 18   < > '15 21 22 15  '$ CALCIUM 9.6  --  8.8* 9.2  --  8.3*  CREATININE 0.82  --  0.78 0.78 0.60 0.71  GFRNONAA >60  --  >60 >60  --  >60  GFRAA >60  --  >60 >60  --  >60   < > = values in this interval not displayed.    LIVER FUNCTION TESTS: Recent Labs     10/03/18 0936 10/26/18 0900 11/23/18 0930 07/17/19 2152  BILITOT 0.5 0.3 0.4 1.2  AST '23 22 22 '$ 33  ALT 33 32 31 37  ALKPHOS 108 101 97 80  PROT 7.7 7.2 6.9 6.9  ALBUMIN 4.4 4.0 3.8 4.2    TUMOR MARKERS: No results for input(s): AFPTM, CEA, CA199, CHROMGRNA in the last 8760 hours.  Assessment and Plan:  69 y/o F with PMH as per HPI seen today for follow up mechanical thrombectomy of the occluded distal M2 segment of the inferior division achieving TICI 3 revascularization on 07/17/19 with Dr. Estanislado Pandy.   Dr. Estanislado Pandy, patient and her husband were present for consultation. We discussed the nature of temporal lobe strokes and that it is not uncommon to experience short term memory issues for some time after the event, however these issue should continue to improve over the coming weeks/months with the hope that her memory will return to her baseline, although sometimes there can be some residual memory issues that are long lasting. We discussed her hospital course and the intervention she underwent per her request and answered all questions to  their satisfaction.  Plan for follow-up:  - Continue follow up with neurology (per patient she is scheduled to be seen in 3 months) - She does not need to follow up with our service again unless there is a new concern, at which time she may be referred back to our service by neurology/PCP - Encouraged tobacco cessation, BP control and light exercise. - Maintain all follow ups with other services  All questions answered and concerns addressed.  Patient conveys understanding and agrees with plan, she was encouraged to call with any questions or concerns.  Thank you for this interesting consult.  I greatly enjoyed meeting Jakyah Bradby and look forward to participating in their care.  A copy of this report was sent to the requesting provider on this date.  Electronically Signed: Joaquim Nam, PA-C 08/31/2019, 9:29 AM   I spent a total  of  28 Minutesin face to face in clinical consultation, greater than 50% of which was counseling/coordinating care for follow up mechanical thrombectomy.

## 2019-09-01 ENCOUNTER — Other Ambulatory Visit: Payer: Self-pay | Admitting: *Deleted

## 2019-09-01 NOTE — Patient Outreach (Signed)
[facility]) Care Management  09/01/2019  Jordan Martinez 06/25/1951 [id number]   Subjective: Telephone call to patient's home / mobile number, spoke with patient, and HIPAA verified.  Patient states she is doing okay, just got in the door, and was resting from a long day.  States she has had several MD appointments this week, still have pounding headaches related to the stroke and per MD are expected to last up to 3 months.  States she has started on medication for the headaches, the medication is working, and MD will increase dose if needed.  States she is aware that EMMI automated calls have ended and is very appreciative of RNCM 's follow up.  Patient states she is aware of signs/ symptoms to report, how to reach provider if needed after hours, when to go to ED, and / or call 911.   Patient states she does not have any education material, EMMI follow up, care coordination, care management, disease monitoring, transportation, community resource, or pharmacy needs at this time.      Objective:Per KPN (Knowledge Performance Now, point of care tool) and chart review,patient hospitalized 07/17/2019 - 07/19/2019 for stroke, Left MCA infarct due occlusion, status post tPA and with small SAH sylvian fissure, infarct embolic secondary tounknownsource. Patient also has a history of diabetes, endometrial cancer, hyperlipidemia, [place].      Assessment: Received Medicare EMMI [place] [employer] Alert follow up referrals on1/29/2021,07/31/2019 and 07/28/2019.Red Flag Alert Trigger, Day # 13, on 08/04/2019, patient answered yes to the following question: Questions/problems with meds?Red Flag Alert Triggers,Day #9,times 2 on 07/31/2019, patient answered yes to the following questions: Questions/problems with meds?Sad, hopeless, anxious, or empty?Red Flag Alert Triggers,Day #6,times 3 on 07/28/2019, patient  answered yes to the following questions:Feeling worse overall?Questions/problems with meds?Smoked or been around smoke?EMMI follow up completed and no further care management needs.       Plan:RNCM will complete case closure due to follow up completed / no care management needs.      Kilani Joffe H. Annia Friendly, BSN, [place] [facility] Telephonic CM Phone: [phone] Fax: [phone]

## 2019-09-06 ENCOUNTER — Telehealth: Payer: Self-pay | Admitting: Adult Health

## 2019-09-06 NOTE — Telephone Encounter (Signed)
I called pt and relayed that per JM/NP amitriptyline is helping at that dose to continue.  I relayed that she can always go off this later if she want s to see how she does, recommended to keep on until seen in 11-30-19 by JM/NP and discuss at that time.  She verbalized understanding.

## 2019-09-06 NOTE — Telephone Encounter (Signed)
Pt called to give provider an update on how she is doing on the amitriptyline (ELAVIL) 25 MG tablet Pt states it has been working wonderful, she is down to minimal head and eye pain compared to the pain she had before. Pt would like to know if there should be a change in dosage or does she stop taking it and if so does she need to be weaned off. Please advise.

## 2019-09-06 NOTE — Telephone Encounter (Signed)
Please advise her if current dose is working well we will continue at the same dosage taking nightly at this time.

## 2019-09-11 ENCOUNTER — Other Ambulatory Visit: Payer: Self-pay | Admitting: Adult Health

## 2019-09-11 DIAGNOSIS — G43611 Persistent migraine aura with cerebral infarction, intractable, with status migrainosus: Secondary | ICD-10-CM

## 2019-09-11 MED ORDER — AMITRIPTYLINE HCL 50 MG PO TABS: 50.0000 mg | ORAL_TABLET | Freq: Every day | ORAL | 3 refills | Status: DC

## 2019-09-11 NOTE — Telephone Encounter (Signed)
Pt has called to report that her headaches were getting better but as of Friday they have gotten worse. Pt is asking for a call to discuss

## 2019-09-11 NOTE — Telephone Encounter (Signed)
I called pt back after consulting with JM/NP. Pt is to take amitriptyline 50mg  po qhs. For headache.  She will let us know how she does.  She states that her headaches started 2 wk prior to stroke,  She did not have headaches after chemo.  I relayed to take 2 tabs po qhs.  May take tylenol prn (too much can cause rebound headaches).  She will call us back as needed.

## 2019-09-11 NOTE — Telephone Encounter (Signed)
I would recommend increasing amitriptyline dosage as this initially helped at a lower dose.  Please let me know if she is willing to trial amitriptyline and increase dosage will be sent in.  She has experienced these headaches prior but possibility of recent stroke worsening headaches.

## 2019-09-11 NOTE — Telephone Encounter (Signed)
I called pt and she is having progressive pain, / pressure in forehead/leyes progressviely worsen as day goes on.  She states lights make worse.  Saw opthmologist and had good report last week.  Taking 25mg  amitriptyline.  She called in Thursday last and was doing ok, then Friday came back.  She is asking if this is stroke related, will this ever go away?  ? Increase dose?  Please advise.

## 2019-09-18 ENCOUNTER — Ambulatory Visit (INDEPENDENT_AMBULATORY_CARE_PROVIDER_SITE_OTHER): Payer: Medicare Other | Admitting: *Deleted

## 2019-09-18 DIAGNOSIS — I63412 Cerebral infarction due to embolism of left middle cerebral artery: Secondary | ICD-10-CM

## 2019-09-19 ENCOUNTER — Encounter: Payer: Self-pay | Admitting: Gynecologic Oncology

## 2019-09-19 ENCOUNTER — Other Ambulatory Visit: Payer: Self-pay

## 2019-09-19 ENCOUNTER — Inpatient Hospital Stay: Payer: Medicare Other | Attending: Gynecologic Oncology | Admitting: Gynecologic Oncology

## 2019-09-19 VITALS — BP 114/69 | HR 76 | Temp 98.3°F | Resp 17 | Ht 64.0 in | Wt 202.8 lb

## 2019-09-19 DIAGNOSIS — N950 Postmenopausal bleeding: Secondary | ICD-10-CM | POA: Insufficient documentation

## 2019-09-19 DIAGNOSIS — Z8673 Personal history of transient ischemic attack (TIA), and cerebral infarction without residual deficits: Secondary | ICD-10-CM | POA: Insufficient documentation

## 2019-09-19 DIAGNOSIS — I119 Hypertensive heart disease without heart failure: Secondary | ICD-10-CM | POA: Diagnosis not present

## 2019-09-19 DIAGNOSIS — E860 Dehydration: Secondary | ICD-10-CM | POA: Insufficient documentation

## 2019-09-19 DIAGNOSIS — R971 Elevated cancer antigen 125 [CA 125]: Secondary | ICD-10-CM | POA: Insufficient documentation

## 2019-09-19 DIAGNOSIS — C541 Malignant neoplasm of endometrium: Secondary | ICD-10-CM | POA: Diagnosis present

## 2019-09-19 DIAGNOSIS — Z79899 Other long term (current) drug therapy: Secondary | ICD-10-CM | POA: Insufficient documentation

## 2019-09-19 DIAGNOSIS — Z7982 Long term (current) use of aspirin: Secondary | ICD-10-CM | POA: Diagnosis not present

## 2019-09-19 DIAGNOSIS — Z6834 Body mass index (BMI) 34.0-34.9, adult: Secondary | ICD-10-CM | POA: Diagnosis not present

## 2019-09-19 DIAGNOSIS — R140 Abdominal distension (gaseous): Secondary | ICD-10-CM | POA: Diagnosis not present

## 2019-09-19 DIAGNOSIS — E119 Type 2 diabetes mellitus without complications: Secondary | ICD-10-CM | POA: Diagnosis not present

## 2019-09-19 DIAGNOSIS — M1990 Unspecified osteoarthritis, unspecified site: Secondary | ICD-10-CM | POA: Diagnosis not present

## 2019-09-19 DIAGNOSIS — R4701 Aphasia: Secondary | ICD-10-CM | POA: Insufficient documentation

## 2019-09-19 DIAGNOSIS — Z7984 Long term (current) use of oral hypoglycemic drugs: Secondary | ICD-10-CM | POA: Diagnosis not present

## 2019-09-19 DIAGNOSIS — M2550 Pain in unspecified joint: Secondary | ICD-10-CM | POA: Insufficient documentation

## 2019-09-19 DIAGNOSIS — Z9221 Personal history of antineoplastic chemotherapy: Secondary | ICD-10-CM | POA: Diagnosis not present

## 2019-09-19 DIAGNOSIS — Z90710 Acquired absence of both cervix and uterus: Secondary | ICD-10-CM | POA: Insufficient documentation

## 2019-09-19 DIAGNOSIS — I2510 Atherosclerotic heart disease of native coronary artery without angina pectoris: Secondary | ICD-10-CM | POA: Diagnosis not present

## 2019-09-19 DIAGNOSIS — Z87442 Personal history of urinary calculi: Secondary | ICD-10-CM | POA: Diagnosis not present

## 2019-09-19 DIAGNOSIS — Z90722 Acquired absence of ovaries, bilateral: Secondary | ICD-10-CM | POA: Insufficient documentation

## 2019-09-19 DIAGNOSIS — Z800 Family history of malignant neoplasm of digestive organs: Secondary | ICD-10-CM | POA: Insufficient documentation

## 2019-09-19 DIAGNOSIS — K449 Diaphragmatic hernia without obstruction or gangrene: Secondary | ICD-10-CM | POA: Diagnosis not present

## 2019-09-19 DIAGNOSIS — I700 Atherosclerosis of aorta: Secondary | ICD-10-CM | POA: Insufficient documentation

## 2019-09-19 DIAGNOSIS — R9389 Abnormal findings on diagnostic imaging of other specified body structures: Secondary | ICD-10-CM | POA: Insufficient documentation

## 2019-09-19 DIAGNOSIS — Z888 Allergy status to other drugs, medicaments and biological substances status: Secondary | ICD-10-CM | POA: Insufficient documentation

## 2019-09-19 DIAGNOSIS — F419 Anxiety disorder, unspecified: Secondary | ICD-10-CM | POA: Diagnosis not present

## 2019-09-19 DIAGNOSIS — I6523 Occlusion and stenosis of bilateral carotid arteries: Secondary | ICD-10-CM | POA: Diagnosis not present

## 2019-09-19 DIAGNOSIS — I6602 Occlusion and stenosis of left middle cerebral artery: Secondary | ICD-10-CM | POA: Diagnosis not present

## 2019-09-19 NOTE — Progress Notes (Signed)
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 MSI high, MMR abnormal (loss of [place] 6). Lynch syndrome  S/p 6 cycles platinum and taxane chemotherapy completed April 22nd, 2020.  Complete clinical response, no evidence of disease on exam.   Post-treatment imaging revealed nodule at vaginal cuff (36m). No concerning findings on examination and stable/benign appearing on follow-up imaging.   Plan  I am recommending 356-monthurveillance physical examinations until April 2022 at which time we will transition to 6 monthly evaluations.  She does not appear to have metastatic disease on most recent pet imaging, therefore additional routine screening imaging is not indicated at this time but will be ordered based on development of new symptoms or physical exam findings.  Survivorship care plan given.  Chief Complaint  Patient presents with  . [facility]   Follow up    GYN Oncologic Summary  HPI: Ms. VaKaiden Dardisis a very nice moderately anxious [age].o.  P2 who was seen as a consult from Dr HaIhor Dow  She noted 3 weeks of postmenopausal bleeding.  On 03/30/2018 she reports this was quite heavy with cramps.  [place] and she was unable to find a provider in a timely manner.  She [place] by the [place] see Dr. HaIhor Dow Given the patient's symptoms an endometrial biopsy was performed along with a pelvic ultrasound.  Documented exam by Dr. HaIhor Dowtates that there was a lesion at 6:00 on the cervix which was biopsied.  In addition to the endometrial biopsy being performed.  The endometrial biopsy revealed high-grade carcinoma with a comment stating the majority appears to be FIGO grade 3  endometrioid with scattered foci suggestive of serous phenotype.  The cervix revealed unremarkable endocervical glandular and squamous mucosa there were detached fragments of high-grade carcinoma similar to the endometrial biopsy tissue.  Ultrasound revealed a uterus 6.6 x 3 x 3.7 cm with no fibroids endometrium 1.6 cm right ovary normal left ovary not visualized.  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  A CT scan was performed preop and noted below.  In summary no evidence of extrauterine disease.  She admits to anxiety and has some pressured speech today.  She does note some vague pain and again the primary complaint of the vaginal bleeding.  Given the diagnosis of uterine cancer she was referred for management and recommendations.  The patient underwent surgical staging with a robotic assisted total hysterectomy BSO sentinel lymph node biopsy on May 03, 2018.  Due to her symptoms of abdominal bloating, elevated Ca1 25, and her daughter's history of Lynch syndrome her tinea washings were obtained.  Intraoperative findings were otherwise unremarkable and she had no gross extrauterine disease within normal omentum diaphragms and peritoneal surfaces, 6 cm normal-appearing uterus, normal tubes and ovaries, no suspicious nodes.  The pathology revealed positive malignant cells consistent with metastatic adenocarcinoma in the peritoneal washings.  The histopathology report revealed a FIGO grade 1 endometrioid endometrial adenocarcinoma measuring 2.2 cm and invading less than one half of the myometrium (3  of 12 mm), with no LVSI, and 5- sentinel lymph nodes, with normal cervix and adnexa.  Given the finding of positive washings, the fallopian tubes and ovaries were subsequently submitted for complete serial sectioning and no occult ovarian or fallopian tube  carcinoma was identified.  MSI testing showed high instability, and mismatch repair protein IHC was abnormal with loss of nuclear expression of [place] 6.  She was subsequently seen by the genetics counselors on 05/18/2018 who drew blood for genetic screening including Lynch syndrome which was positive (mutation in MSH6).  Given the presence of positive washings and the preoperative biopsy of high grade cancer, she was felt to represent high risk for recurrence, and therefore adjuvant chemotherapy with carboplatin and paclitaxel was recommended.   She received chemotherapy between June 20, 2018 and October 26, 2018.  Chemotherapy was somewhat poorly tolerated by the patient who had severe bone pain, and issues with diabetes and dehydration.  She received 2 cycles of carboplatin with paclitaxel, a third cycle with Taxol at 50%, and then cycle 4-6 without Taxol carboplatin as a single agent.  Post treatment imaging on Nov 23, 2018 revealed an 8 mm hyperattenuating nodule along the midline vaginal cuff this is well-circumscribed.  It appeared related to calcification.  Otherwise no evidence of metastatic disease in the abdomen and pelvis.  A follow-up PET/CT was ordered and performed on 02/23/2021 reassess the area at the vaginal cuff that had previously shown some nodularity and avidity.  On this follow-up PET scan there was no metabolic evidence of recurrent metastatic disease.  There is a stable benign postsurgical change at the hysterectomy margin that had calcified.  No other new disease was visualized.  The PET does show evidence of coronary artery disease and atherosclerosis.  Interval Hx:   She has no symptoms concerning for recurrence.  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 She is on ASA 81 mg.   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 Endometrium, biopsy - HIGH GRADE CARCINOMA, SEE COMMENT. 2. Cervix, biopsy - UNREMARKABLE ENDOCERVICAL GLANDULAR AND SQUAMOUS MUCOSA. - MULTIPLE DETACHED FRAGMENTS OF HIGH GRADE CARCINOMA, MORPHOLOGICALLY SIMILAR TO SPECIMEN 1. Microscopic Comment 1. 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   04/21/2018 Imaging   US pelvis Abnormally thickened endometrium measuring up to 16 mm. In the setting of post-menopausal bleeding, endometrial sampling is indicated to exclude carcinoma. If results are benign, sonohysterogram should be considered for focal lesion work-up.    04/27/2018 Imaging   Ct abdomen and pelvis 1. Thickening of the endometrium identified compatible with endometrial carcinoma. 2. No findings to suggest metastatic adenopathy or distant metastatic disease. 3. Hiatal hernia 4.  Aortic Atherosclerosis (ICD10-I70.0).    05/02/2018 Tumor Marker   Patient's tumor was tested for the following markers: CA-125 Results of the tumor marker test revealed 65.3   05/03/2018 Pathology Results   1. Lymph node, sentinel, biopsy, right obturator - ONE OF ONE LYMPH NODES NEGATIVE FOR CARCINOMA (0/1). 2. Lymph node, sentinel, biopsy, left external iliac - FOUR OF FOUR LYMPH NODES NEGATIVE FOR CARCINOMA (0/4). 3. Uterus +/- tubes/ovaries, neoplastic, cervix, bilateral tubes and ovaries - UTERUS: -ENDO/MYOMETRIUM: INVASIVE ENDOMETRIOID ADENOCARCINOMA WITH  SQUAMOUS DIFFERENTIATION, FIGO GRADE 1, SPANNING 2.2 CM. TUMOR INVADES LESS THAN ONE HALF OF THE MYOMETRIUM. SEE ONCOLOGY TABLE. -SEROSA: UNREMARKABLE. NO MALIGNANCY. - CERVIX: BENIGN SQUAMOUS AND ENDOCERVICAL MUCOSA. NO DYSPLASIA OR MALIGNANCY. - BILATERAL OVARIES: INCLUSION CYSTS. NO MALIGNANCY. - BILATERAL  FALLOPIAN TUBES: UNREMARKABLE. NO MALIGNANCY. Microscopic Comment 3. UTERUS, CARCINOMA OR CARCINOSARCOMA Procedure: Total hysterectomy with bilateral salpingo-oophorectomy. Right obturator and left external iliac lymph node biopsies. Histologic type: Endometrioid adenocarcinoma with squamous differentiation. Histologic Grade: FIGO grade I. Myometrial invasion: Depth of invasion: 3 mm Myometrial thickness: 12 mm Uterine Serosa Involvement: Not identified. Cervical stromal involvement: Not identified. Extent of involvement of other organs: Uninvolved. Lymphovascular invasion: Not identified. Regional Lymph Nodes: Examined: 5 Sentinel 0 Non-sentinel 5 Total Lymph nodes with metastasis: 0 Isolated tumor cells (< 0.2 mm): 0 Micrometastasis: (> 0.2 mm and < 2.0 mm): 0 Macrometastasis: (> 2.0 mm): 0 Extracapsular extension: N/A. MMR / MSI testing: Will be ordered. Pathologic Stage Classification (pTNM, AJCC 8th edition): pT1a, pN0 FIGO Stage: IA Representative Tumor Block: 3D-G Comment: Pancytokeratin was performed on the lymph nodes and is negative   05/03/2018 Surgery   Operation: Robotic-assisted laparoscopic total hysterectomy with bilateral salpingoophorectomy, SLN biopsy   Surgeon: Donaciano Eva  Operative Findings:  : 6cm normal appearing uterus. Normal tubes and ovaries. Normal omentum and diaphragm. No suspicious nodes.     05/03/2018 Pathology Results   PERITONEAL WASHING(SPECIMEN 1 OF 1 COLLECTED 05/03/18): MALIGNANT CELLS CONSISTENT WITH METASTATIC ADENOCARCINOMA.   05/18/2018 Tumor Marker   Patient's tumor was tested for the following markers: CA-125 Results of the tumor marker test revealed 77.5   05/27/2018 Genetic Testing   MSH6 c.2731C>T pathogenic variant and GATA2 c.1232C>T VUS identified on the multicancer panel.  The Multi-Gene Panel offered by [facility] includes sequencing and/or deletion duplication testing of the following 85 genes: AIP, ALK,  APC, ATM, AXIN2,BAP1,  BARD1, BLM, BMPR1A, BRCA1, BRCA2, BRIP1, CASR, CDC73, CDH1, CDK4, CDKN1B, CDKN1C, CDKN2A (p14ARF), CDKN2A (p16INK4a), CEBPA, CHEK2, CTNNA1, DICER1, DIS3L2, EGFR (c.2369C>T, p.Thr790Met variant only), EPCAM (Deletion/duplication testing only), FH, FLCN, GATA2, GPC3, GREM1 (Promoter region deletion/duplication testing only), HOXB13 (c.251G>A, p.Gly84Glu), HRAS, KIT, MAX, MEN1, MET, MITF (c.952G>A, p.Glu318Lys variant only), MLH1, MSH2, MSH3, MSH6, MUTYH, NBN, NF1, NF2, NTHL1, PALB2, PDGFRA, PHOX2B, PMS2, POLD1, POLE, POT1, PRKAR1A, PTCH1, PTEN, RAD50, RAD51C, RAD51D, RB1, RECQL4, RET, RNF43, RUNX1, SDHAF2, SDHA (sequence changes only), SDHB, SDHC, SDHD, SMAD4, SMARCA4, SMARCB1, SMARCE1, STK11, SUFU, TERC, TERT, TMEM127, TP53, TSC1, TSC2, VHL, WRN and WT1.  The report date is 05/27/2018.   06/09/2018 Cancer Staging   Staging form: Corpus Uteri - Carcinoma and Carcinosarcoma, AJCC 8th Edition - Pathologic: Stage I (pT1, pN0, cM0) - Signed by Heath Lark, MD on 06/09/2018   06/15/2018 Procedure   Placement of single lumen port a cath via right internal jugular vein. The catheter tip lies at the cavo-atrial junction. A power injectable port a cath was placed and is ready for immediate use.    06/20/2018 - 10/26/2018 Chemotherapy   The patient had carboplatin and Taxol x 2 cycles, Cycle 3 Taxol at 50%, then cycle 4-6 without Taxol, carboplatin only   06/25/2018 - 06/27/2018 Hospital Admission   She was admitted to the hospital with deydration   11/23/2018 Imaging   1. 8 mm hyperattenuating nodule along the midline vaginal cuff. Coronal imaging suggests that the hyperattenuation is related to calcification, but enhancement in this nodule cannot be excluded. Patient is status post hysterectomy in the interval since prior CT. While this finding  may be related to prior surgery, close follow-up recommended. 2. Otherwise no evidence for metastatic disease in the abdomen or pelvis. 3. Large  hiatal hernia with more than 50% of the stomach herniated into the lower chest, stable. 4.  Aortic Atherosclerois (ICD10-170.0)   12/01/2018 Procedure   Successful removal of implanted Port-A-Cath     Measurement of disease: Pending further workup . CA125 TBD  Radiology: CT Code Stroke CTA Head W/WO contrast  Result Date: 07/17/2019 CLINICAL DATA:  69 year old female code stroke with aphasia and asymmetric left MCA hyperdensity on plain head CT today. EXAM: CT ANGIOGRAPHY HEAD AND NECK CT PERFUSION BRAIN TECHNIQUE: Multidetector CT imaging of the head and neck was performed using the standard protocol during bolus administration of intravenous contrast. Multiplanar CT image reconstructions and MIPs were obtained to evaluate the vascular anatomy. Carotid stenosis measurements (when applicable) are obtained utilizing NASCET criteria, using the distal internal carotid diameter as the denominator. Multiphase CT imaging of the brain was performed following IV bolus contrast injection. Subsequent parametric perfusion maps were calculated using RAPID software. CONTRAST:  16m OMNIPAQUE IOHEXOL 350 MG/ML SOLN COMPARISON:  Plain head CT 2157 hours today. FINDINGS: CT Brain Perfusion Findings: ASPECTS: 10 CBF (<30%) Volume: 723mPerfusion (Tmax>6.0s) volume: 5355mypoperfusion index of 0.4. Mismatch Volume: 27m75mfarction Location:Left MCA, operculum white matter. Skeleton: Cervical spine degeneration. No acute osseous abnormality identified. Upper chest: Negative. Other neck: Partially retropharyngeal course of both carotids and carotid bifurcations. No acute findings in the neck. Aortic arch: 3 vessel arch configuration with mild to moderate calcified arch atherosclerosis. Tortuous great vessel origins. Right carotid system: Tortuous brachiocephalic artery and proximal right CCA without stenosis. Retropharyngeal right carotid bifurcation with mild cervical right ICA plaque and no stenosis. Left carotid  system: Tortuous proximal left CCA without stenosis. Retropharyngeal left carotid bifurcation with bulky calcified plaque at the posterior left ICA origin and bulb resulting in 50 % stenosis with respect to the distal vessel. No additional plaque to the skull base. Vertebral arteries: No proximal right subclavian artery or right vertebral artery origin plaque or stenosis. The right vertebral appears dominant and is patent to the skull base without stenosis. Mild plaque in the proximal left subclavian artery without stenosis. Normal left vertebral artery origin. Tortuous left V1 segment. Minimally non dominant left vertebral artery is patent to the skull base without stenosis. CTA HEAD Posterior circulation: Mild bilateral calcified V4 segment plaque without stenosis. Patent vertebrobasilar junction and basilar artery without stenosis. Patent PICA origins. Ectatic basilar tip. Normal SCA and PCA origins otherwise. Posterior communicating arteries are diminutive or absent. Bilateral PCA branches are within normal limits. Anterior circulation: Both ICA siphons are patent. Mild to moderate calcified plaque on the left without significant siphon stenosis. Minimal to mild calcified plaque on the right without siphon stenosis. Normal ophthalmic artery origins. Patent carotid termini. Normal MCA and ACA origins. Anterior communicating artery and bilateral ACA branches are within normal limits. Right MCA M1 segment and trifurcation are patent without stenosis. Right MCA branches are within normal limits. Left MCA M1 segment is patent, and a left MCA trifurcation occurs somewhat early and is patent without stenosis. A dominant posterior left M2 branch is occluded 11-12 millimeters distal to the trifurcation as seen on series 10, image 137 and series 11, image 24. The other left M2 branches seem to remain patent. Venous sinuses: Early contrast timing. The superior sagittal sinus, torcula and the dominant right transverse  sinuses appear patent. Anatomic variants: Retropharyngeal course of both  cervical carotids. Mildly dominant right vertebral. Review of the MIP images confirms the above findings IMPRESSION: 1. 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 This finding was communicated to Dr. Rory Percy at 1027 hours via the [facility] messaging system. 2. CT Perfusion detects 7 mL of infarct core and 53 mL of ischemic penumbra in the corresponding Left MCA territory/operculum. 3. Superimposed 50% stenosis at the Left ICA origin due to calcified plaque. Mild left siphon plaque without stenosis. 4. Mild for age atherosclerosis elsewhere in the head and neck. Tortuous proximal great vessels and incidental retropharyngeal course of both cervical carotids. Electronically Signed   By: Genevie Ann M.D.   On: 07/17/2019 22:36   CT HEAD WO CONTRAST  Result Date: 07/18/2019 CLINICAL DATA:  Stroke follow-up. EXAM: CT HEAD WITHOUT CONTRAST TECHNIQUE: Contiguous axial images were obtained from the base of the skull through the vertex without intravenous contrast. COMPARISON:  Limited head CT catheter angiography earlier today. FINDINGS: Brain: No detectable infarct. Small volume contrast/blood product extravasation along the left sylvian fissure is unchanged from intraoperative CT. No hydrocephalus or shift. Vascular: Negative Skull: Negative Sinuses/Orbits: Negative IMPRESSION: 1. Stable small volume subarachnoid contrast/blood along the left sylvian fissure. 2. No detectable infarct. Electronically Signed   By: Monte Fantasia M.D.   On: 07/18/2019 05:13   CT Code Stroke CTA Neck W/WO contrast  Result Date: 07/17/2019 CLINICAL DATA:  69 year old female code stroke with aphasia and asymmetric left MCA hyperdensity on plain head CT today. EXAM: CT ANGIOGRAPHY HEAD AND NECK CT PERFUSION BRAIN TECHNIQUE: Multidetector CT imaging of the head and neck was performed using the  standard protocol during bolus administration of intravenous contrast. Multiplanar CT image reconstructions and MIPs were obtained to evaluate the vascular anatomy. Carotid stenosis measurements (when applicable) are obtained utilizing NASCET criteria, using the distal internal carotid diameter as the denominator. Multiphase CT imaging of the brain was performed following IV bolus contrast injection. Subsequent parametric perfusion maps were calculated using RAPID software. CONTRAST:  164m OMNIPAQUE IOHEXOL 350 MG/ML SOLN COMPARISON:  Plain head CT 2157 hours today. FINDINGS: CT Brain Perfusion Findings: ASPECTS: 10 CBF (<30%) Volume: 736mPerfusion (Tmax>6.0s) volume: 5338mypoperfusion index of 0.4. Mismatch Volume: 76m58mfarction Location:Left MCA, operculum white matter. Skeleton: Cervical spine degeneration. No acute osseous abnormality identified. Upper chest: Negative. Other neck: Partially retropharyngeal course of both carotids and carotid bifurcations. No acute findings in the neck. Aortic arch: 3 vessel arch configuration with mild to moderate calcified arch atherosclerosis. Tortuous great vessel origins. Right carotid system: Tortuous brachiocephalic artery and proximal right CCA without stenosis. Retropharyngeal right carotid bifurcation with mild cervical right ICA plaque and no stenosis. Left carotid system: Tortuous proximal left CCA without stenosis. Retropharyngeal left carotid bifurcation with bulky calcified plaque at the posterior left ICA origin and bulb resulting in 50 % stenosis with respect to the distal vessel. No additional plaque to the skull base. Vertebral arteries: No proximal right subclavian artery or right vertebral artery origin plaque or stenosis. The right vertebral appears dominant and is patent to the skull base without stenosis. Mild plaque in the proximal left subclavian artery without stenosis. Normal left vertebral artery origin. Tortuous left V1 segment. Minimally non  dominant left vertebral artery is patent to the skull base without stenosis. CTA HEAD Posterior circulation: Mild bilateral calcified V4 segment plaque without stenosis. Patent vertebrobasilar junction and basilar artery without stenosis. Patent PICA origins. Ectatic basilar  tip. Normal SCA and PCA origins otherwise. Posterior communicating arteries are diminutive or absent. Bilateral PCA branches are within normal limits. Anterior circulation: Both ICA siphons are patent. Mild to moderate calcified plaque on the left without significant siphon stenosis. Minimal to mild calcified plaque on the right without siphon stenosis. Normal ophthalmic artery origins. Patent carotid termini. Normal MCA and ACA origins. Anterior communicating artery and bilateral ACA branches are within normal limits. Right MCA M1 segment and trifurcation are patent without stenosis. Right MCA branches are within normal limits. Left MCA M1 segment is patent, and a left MCA trifurcation occurs somewhat early and is patent without stenosis. A dominant posterior left M2 branch is occluded 11-12 millimeters distal to the trifurcation as seen on series 10, image 137 and series 11, image 24. The other left M2 branches seem to remain patent. Venous sinuses: Early contrast timing. The superior sagittal sinus, torcula and the dominant right transverse sinuses appear patent. Anatomic variants: Retropharyngeal course of both cervical carotids. Mildly dominant right vertebral. Review of the MIP images confirms the above findings IMPRESSION: 1. 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 This finding was communicated to Dr. Rory Percy at 1027 hours via the [facility] messaging system. 2. CT Perfusion detects 7 mL of infarct core and 53 mL of ischemic penumbra in the corresponding Left MCA territory/operculum. 3. Superimposed 50% stenosis at the Left ICA origin due to calcified plaque.  Mild left siphon plaque without stenosis. 4. Mild for age atherosclerosis elsewhere in the head and neck. Tortuous proximal great vessels and incidental retropharyngeal course of both cervical carotids. Electronically Signed   By: Genevie Ann M.D.   On: 07/17/2019 22:36   MR MRA HEAD WO CONTRAST  Addendum Date: 07/25/2019   ADDENDUM REPORT: 07/25/2019 05:37 ADDENDUM: The Heidelberg classification of subarachnoid hemorrhage is 3c. Electronically Signed   By: San Morelle M.D.   On: 07/25/2019 05:37   Result Date: 07/25/2019 CLINICAL DATA:  69 year old female with history of left M2 occlusion, status post catheter directed revascularization. Follow-up exam. She EXAM: MRI HEAD WITHOUT CONTRAST MRA HEAD WITHOUT CONTRAST TECHNIQUE: Multiplanar, multiecho pulse sequences of the brain and surrounding structures were obtained without intravenous contrast. Angiographic images of the head were obtained using MRA technique without contrast. COMPARISON:  Prior CTs from 07/17/2019. FINDINGS: MRI HEAD FINDINGS Brain: Cerebral volume within normal limits for age. No significant cerebral white matter disease. Scattered restricted diffusion involving the left insula and adjacent left temporal lobe consistent with acute ischemic left MCA territory infarct. Slight extension into the overlying left frontal operculum. No associated mass effect. Susceptibility artifact extending along the left sylvian fissure consistent with small volume subarachnoid hemorrhage, stable from prior CT. No hemorrhagic transformation about the infarct itself. Remainder the brain is normal in appearance with no other areas of acute or subacute ischemia. No areas of chronic cortical infarction. No mass lesion, midline shift or mass effect. No hydrocephalus. No extra-axial fluid collection. Pituitary gland within normal limits. Midline structures intact. Vascular: Major intracranial vascular flow voids are maintained. Skull and upper cervical spine:  Craniocervical junction within normal limits. Upper cervical spine normal. Bone marrow signal intensity within normal limits. No scalp soft tissue abnormality. Sinuses/Orbits: Patient status post bilateral ocular lens replacement. Paranasal sinuses are clear. No mastoid effusion. Inner ear structures grossly normal. Other: None. MRA HEAD FINDINGS ANTERIOR CIRCULATION: Distal cervical segments of the internal carotid arteries are widely patent with  symmetric antegrade flow. Petrous, cavernous, and supraclinoid ICAs widely patent bilaterally. A1 segments widely patent. Normal anterior communicating artery. Anterior cerebral arteries widely patent to their distal aspects. No M1 stenosis or occlusion. Normal MCA bifurcations. Previously seen occluded left M2 branch is now patent. Distal MCA branches well perfused and symmetric bilaterally. POSTERIOR CIRCULATION: Vertebral arteries patent to the vertebrobasilar junction without stenosis. Posterior inferior cerebral arteries patent bilaterally. Basilar widely patent to its distal aspect without stenosis. Superior cerebellar and posterior cerebral arteries widely patent bilaterally. No intracranial aneurysm. IMPRESSION: MRI HEAD IMPRESSION: 1. Patchy small volume acute ischemic left MCA territory infarct involving the left insula, left frontal operculum, and adjacent left temporal lobe. 2. Small volume subarachnoid hemorrhage extending along the left sylvian fissure, stable from prior CT. 3. Otherwise normal brain MRI. MRA HEAD IMPRESSION: Normal intracranial MRA. Previously seen occluded left M2 branch is now patent. Electronically Signed: By: Jeannine Boga M.D. On: 07/18/2019 22:03   MR BRAIN WO CONTRAST  Addendum Date: 07/25/2019   ADDENDUM REPORT: 07/25/2019 05:37 ADDENDUM: The Heidelberg classification of subarachnoid hemorrhage is 3c. Electronically Signed   By: San Morelle M.D.   On: 07/25/2019 05:37   Result Date: 07/25/2019 CLINICAL DATA:   69 year old female with history of left M2 occlusion, status post catheter directed revascularization. Follow-up exam. She EXAM: MRI HEAD WITHOUT CONTRAST MRA HEAD WITHOUT CONTRAST TECHNIQUE: Multiplanar, multiecho pulse sequences of the brain and surrounding structures were obtained without intravenous contrast. Angiographic images of the head were obtained using MRA technique without contrast. COMPARISON:  Prior CTs from 07/17/2019. FINDINGS: MRI HEAD FINDINGS Brain: Cerebral volume within normal limits for age. No significant cerebral white matter disease. Scattered restricted diffusion involving the left insula and adjacent left temporal lobe consistent with acute ischemic left MCA territory infarct. Slight extension into the overlying left frontal operculum. No associated mass effect. Susceptibility artifact extending along the left sylvian fissure consistent with small volume subarachnoid hemorrhage, stable from prior CT. No hemorrhagic transformation about the infarct itself. Remainder the brain is normal in appearance with no other areas of acute or subacute ischemia. No areas of chronic cortical infarction. No mass lesion, midline shift or mass effect. No hydrocephalus. No extra-axial fluid collection. Pituitary gland within normal limits. Midline structures intact. Vascular: Major intracranial vascular flow voids are maintained. Skull and upper cervical spine: Craniocervical junction within normal limits. Upper cervical spine normal. Bone marrow signal intensity within normal limits. No scalp soft tissue abnormality. Sinuses/Orbits: Patient status post bilateral ocular lens replacement. Paranasal sinuses are clear. No mastoid effusion. Inner ear structures grossly normal. Other: None. MRA HEAD FINDINGS ANTERIOR CIRCULATION: Distal cervical segments of the internal carotid arteries are widely patent with symmetric antegrade flow. Petrous, cavernous, and supraclinoid ICAs widely patent bilaterally. A1  segments widely patent. Normal anterior communicating artery. Anterior cerebral arteries widely patent to their distal aspects. No M1 stenosis or occlusion. Normal MCA bifurcations. Previously seen occluded left M2 branch is now patent. Distal MCA branches well perfused and symmetric bilaterally. POSTERIOR CIRCULATION: Vertebral arteries patent to the vertebrobasilar junction without stenosis. Posterior inferior cerebral arteries patent bilaterally. Basilar widely patent to its distal aspect without stenosis. Superior cerebellar and posterior cerebral arteries widely patent bilaterally. No intracranial aneurysm. IMPRESSION: MRI HEAD IMPRESSION: 1. Patchy small volume acute ischemic left MCA territory infarct involving the left insula, left frontal operculum, and adjacent left temporal lobe. 2. Small volume subarachnoid hemorrhage extending along the left sylvian fissure, stable from prior CT. 3. Otherwise normal brain MRI. MRA  HEAD IMPRESSION: Normal intracranial MRA. Previously seen occluded left M2 branch is now patent. Electronically Signed: By: Jeannine Boga M.D. On: 07/18/2019 22:03   EP PPM/ICD IMPLANT  Result Date: 07/19/2019 SURGEON:  Allegra Lai, MD   PREPROCEDURE DIAGNOSIS:  Cryptogenic Stroke   POSTPROCEDURE DIAGNOSIS:  Cryptogenic Stroke    PROCEDURES:  1. Implantable loop recorder implantation   INTRODUCTION:  Tamyka Bezio is a 69 y.o. female with a history of unexplained stroke who presents today for implantable loop implantation.  The patient has had a cryptogenic stroke.  Despite an extensive workup by neurology, no reversible causes have been identified.  she has worn telemetry during which she did not have arrhythmias.  There is significant concern for possible atrial fibrillation as the cause for the patients stroke.  The patient therefore presents today for implantable loop implantation.   DESCRIPTION OF PROCEDURE:  Informed written consent was obtained, and the patient was brought  to the electrophysiology lab in a fasting state.  The patient required no sedation for the procedure today.  Mapping over the patient's chest was performed by the EP lab staff to identify the area where electrograms were most prominent for ILR recording.  This area was found to be the left parasternal region over the 3rd-4th intercostal space. The patients left chest was therefore prepped and draped in the usual sterile fashion by the EP lab staff. The skin overlying the left parasternal region was infiltrated with lidocaine for local analgesia.  A 0.5-cm incision was made over the left parasternal region over the 3rd intercostal space.  A subcutaneous ILR pocket was fashioned using a combination of sharp and blunt dissection.  A Medtronic Reveal Linq model [id number] implantable loop recorder was then placed into the pocket  R waves were very prominent and measured 0.63m. EBL<1 ml.  Steri- Strips and a sterile dressing were then applied.  There were no early apparent complications.   CONCLUSIONS:  1. Successful implantation of a Medtronic Reveal LINQ implantable loop recorder for cryptogenic stroke  2. No early apparent complications.   IR CT Head Ltd  Result Date: 07/18/2019 INDICATION: Acute onset of severe aphasia. Occluded inferior division of the left middle cerebral artery distal M2 segment.  EXAM: 1. EMERGENT LARGE VESSEL OCCLUSION THROMBOLYSIS (anterior CIRCULATION)  CLINICAL DATA:  New onset of global aphasia. Occluded left middle cerebral artery inferior division distal M2 segment.  COMPARISON:  CT angiogram of the head and neck of July 17, 2019.  MEDICATIONS: Ancef 2 g IV antibiotic was administered within 1 hour of the procedure.  ANESTHESIA/SEDATION: General anesthesia.  CONTRAST:  Isovue 300 approximately 80 mL.  FLUOROSCOPY TIME:  Fluoroscopy Time: 33 minutes 18 seconds (1688 mGy).  COMPLICATIONS: None immediate.  TECHNIQUE: Following a full explanation of the procedure along  with the potential associated complications, an informed witnessed consent was obtained from patient's daughter in a 3 way call. The risks of intracranial hemorrhage of 10%, worsening neurological deficit, ventilator dependency, death and inability to revascularize were all reviewed in detail with the patient's daughter.  The patient was then put under general anesthesia by the [facility]  The right groin was prepped and draped in the usual sterile fashion. Thereafter using modified Seldinger technique, transfemoral access into the right common femoral artery was obtained without difficulty. Over a 0.035 inch guidewire a 8 French Pinnacle sheath was inserted. Through this, and also over a 0.035 inch guidewire a 5 [place] 1 catheter  was advanced to the aortic arch region and selectively positioned in the left common carotid artery.  FINDINGS: The left common carotid arteriogram demonstrates the left external carotid artery and its major branches to be widely patent.  The left internal carotid artery at the bulb demonstrates a smooth shallow plaque along the posterior wall without evidence of significant stenosis or of ulcerations.  The vessel, otherwise, opacifies to the cranial skull base with mild tortuosity in the mid cervical left ICA.  More distally, the left internal carotid artery is seen to opacify to the cranial skull base. The petrous, the cavernous and the supraclinoid segments are widely patent.  The left middle cerebral artery and the left anterior cerebral artery opacify into the capillary and venous phases. On the lateral projection there is complete occlusion of the distal M2 segment of the inferior division of the left middle cerebral artery with moderate collateralization.  A dominant right transverse sinus and sigmoid sinus is seen.  PROCEDURE: The diagnostic JB 1 catheter in the left common carotid artery was exchanged over a 0.035 inch 300 cm  Rosen exchange guidewire for an 087 [place] balloon guide catheter which had been prepped with 50% contrast and 50% heparinized saline infusion.  The guidewire was removed. Good aspiration obtained from the hub of the balloon guide catheter. Over a 0.035 inch Roadrunner guidewire, a 6 French 132 cm Catalyst guide catheter was then advanced to the horizontal petrous segment of the left internal carotid artery. The guidewire was removed. Good aspiration was obtained from the hub of the Catalyst guide catheter. A control arteriogram was then performed which demonstrated no change in the left MCA inferior division occlusions.  Over a 0.014 inch standard Synchro micro guidewire, an 021 150 cm Trevo ProVue microcatheter was advanced and positioned in the proximal left middle cerebral artery.  The balloon guide catheter was advanced to the proximal 1/3 of the left internal carotid artery.  Using a torque device, the micro guidewire was then advanced through the occluded inferior division M2 region and advanced into the M3 M4 region followed by the microcatheter. The guidewire was removed. Good aspiration was obtained from the hub of the microcatheter. A gentle control arteriogram performed through the microcatheter demonstrated safe position of tip of the microcatheter. A 4 mm x 40 mm Solitaire X retrieval device was then advanced to the distal end of the microcatheter.  The 6 [place] Catalyst guide catheter was advanced to the origin of the occluded inferior division.  With slight forward gentle traction with the right hand on the delivery micro guidewire, with the left hand the delivery microcatheter was retrieved deploying the retrieval device. With constant aspiration being applied at the hub of the 6 [place] Catalyst guide catheter using the Penumbra aspiration device, and a 60 mL syringe at the hub of the balloon guide catheter hub with proximal flow arrest for 2-1/2 minutes, the combination of the retrieval  device, the microcatheter for 2-1/2 minutes, the retrieval device, the microcatheter and the retrieval device were retrieved and removed. Proximal flow arrest was reversed.  Control arteriogram performed through the 6 [place] were retrieved and removed. Following reversal of flow arrest, a control arteriogram performed through the Catalyst guide catheter now demonstrated moderately improved flow through the previously occluded inferior division M2 M3 region with a TICI 2c revascularization. However, there continued to be a sizable occlusion of the inferior division distal M2 region.  This prompted a second pass using the above combination again advanced into the  M2 M3 region of the occluded inferior division. Again the microcatheter was verified to be in a safe position prior to advancing a 3 mm x 33 mm Trevo ProVue retrieval device to the distal end of the microcatheter. With slight forward gentle traction with the right hand on the delivery micro guidewire with the left hand the delivery microcatheter was retrieved deploying the retrieval device.  The 6 [place] Catalyst guide catheter was advanced just inside the origin of the inferior division of the left middle cerebral artery and Penumbra aspiration was initiated for 2-1/2 minutes.  Again with proximal flow arrest and constant aspiration at the hub of the balloon guide catheter with a 60 mL syringe, the combination of the retrieval device, the microcatheter and the 6 [place] Catalyst guide catheter were retrieved and removed. Small clot was seen entangled within the retrieval device, and also in the aspirate. Following reversal of flow arrest, a control arteriogram performed through the balloon guide catheter in the left internal carotid artery demonstrated complete angiographic revascularization of the occluded inferior division of the left middle cerebral artery achieving a TICI 3 revascularization.  Throughout the procedure, the patient's blood pressure  and neurological status remained stable. No angiographic evidence of mass effect or midline shift or of extravasation was seen.  The 6 [place] Catalyst guide catheter and the balloon guide catheter were retrieved and removed. The 8 French Pinnacle sheath in the right groin was removed with the successful application of an 8 French Angio-Seal closure device for hemostasis. Distal pulses remained palpable in the dorsalis pedis, and the posterior tibial arteries bilaterally unchanged.  A flat panel CT of the brain revealed no evidence of mass effect or midline shift. A small amount of linear hypo attenuation was seen in the posterior peri sylvian fissure probably representing contrast leak with slight subarachnoid hemorrhage.  The patient's general anesthesia was then reversed and the patient was extubated. The patient was able to move all fours equally. She responded to name.  However, she continued to have significant expressive and comprehension difficulties.  She was then transferred to ICU to continue with post thrombectomy management.  IMPRESSION: Status post endovascular complete revascularization of left middle cerebral artery inferior division in the M2 distal segment with 1 pass with the 4 mm x 40 mm Solitaire X retrieval device, and a 3 mm x 33 mm Penumbra Trevo ProVue retrieval device and Penumbra aspiration achieving a TICI 3 revascularization.  PLAN: Follow-up in the clinic 4 weeks post discharge.   Electronically Signed   By: Luanne Bras M.D.   On: 07/18/2019 13:40  CT Code Stroke Cerebral Perfusion with contrast  Result Date: 07/17/2019 CLINICAL DATA:  69 year old female code stroke with aphasia and asymmetric left MCA hyperdensity on plain head CT today. EXAM: CT ANGIOGRAPHY HEAD AND NECK CT PERFUSION BRAIN TECHNIQUE: Multidetector CT imaging of the head and neck was performed using the standard protocol during bolus administration of intravenous contrast. Multiplanar CT image  reconstructions and MIPs were obtained to evaluate the vascular anatomy. Carotid stenosis measurements (when applicable) are obtained utilizing NASCET criteria, using the distal internal carotid diameter as the denominator. Multiphase CT imaging of the brain was performed following IV bolus contrast injection. Subsequent parametric perfusion maps were calculated using RAPID software. CONTRAST:  183m OMNIPAQUE IOHEXOL 350 MG/ML SOLN COMPARISON:  Plain head CT 2157 hours today. FINDINGS: CT Brain Perfusion Findings: ASPECTS: 10 CBF (<30%) Volume: 781mPerfusion (Tmax>6.0s) volume: 5369mypoperfusion index of 0.4. Mismatch Volume: 69m15mfarction Location:Left  MCA, operculum white matter. Skeleton: Cervical spine degeneration. No acute osseous abnormality identified. Upper chest: Negative. Other neck: Partially retropharyngeal course of both carotids and carotid bifurcations. No acute findings in the neck. Aortic arch: 3 vessel arch configuration with mild to moderate calcified arch atherosclerosis. Tortuous great vessel origins. Right carotid system: Tortuous brachiocephalic artery and proximal right CCA without stenosis. Retropharyngeal right carotid bifurcation with mild cervical right ICA plaque and no stenosis. Left carotid system: Tortuous proximal left CCA without stenosis. Retropharyngeal left carotid bifurcation with bulky calcified plaque at the posterior left ICA origin and bulb resulting in 50 % stenosis with respect to the distal vessel. No additional plaque to the skull base. Vertebral arteries: No proximal right subclavian artery or right vertebral artery origin plaque or stenosis. The right vertebral appears dominant and is patent to the skull base without stenosis. Mild plaque in the proximal left subclavian artery without stenosis. Normal left vertebral artery origin. Tortuous left V1 segment. Minimally non dominant left vertebral artery is patent to the skull base without stenosis. CTA HEAD Posterior  circulation: Mild bilateral calcified V4 segment plaque without stenosis. Patent vertebrobasilar junction and basilar artery without stenosis. Patent PICA origins. Ectatic basilar tip. Normal SCA and PCA origins otherwise. Posterior communicating arteries are diminutive or absent. Bilateral PCA branches are within normal limits. Anterior circulation: Both ICA siphons are patent. Mild to moderate calcified plaque on the left without significant siphon stenosis. Minimal to mild calcified plaque on the right without siphon stenosis. Normal ophthalmic artery origins. Patent carotid termini. Normal MCA and ACA origins. Anterior communicating artery and bilateral ACA branches are within normal limits. Right MCA M1 segment and trifurcation are patent without stenosis. Right MCA branches are within normal limits. Left MCA M1 segment is patent, and a left MCA trifurcation occurs somewhat early and is patent without stenosis. A dominant posterior left M2 branch is occluded 11-12 millimeters distal to the trifurcation as seen on series 10, image 137 and series 11, image 24. The other left M2 branches seem to remain patent. Venous sinuses: Early contrast timing. The superior sagittal sinus, torcula and the dominant right transverse sinuses appear patent. Anatomic variants: Retropharyngeal course of both cervical carotids. Mildly dominant right vertebral. Review of the MIP images confirms the above findings IMPRESSION: 1. 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 This finding was communicated to Dr. Rory Percy at 1027 hours via the [facility] messaging system. 2. CT Perfusion detects 7 mL of infarct core and 53 mL of ischemic penumbra in the corresponding Left MCA territory/operculum. 3. Superimposed 50% stenosis at the Left ICA origin due to calcified plaque. Mild left siphon plaque without stenosis. 4. Mild for age atherosclerosis elsewhere in the head  and neck. Tortuous proximal great vessels and incidental retropharyngeal course of both cervical carotids. Electronically Signed   By: Genevie Ann M.D.   On: 07/17/2019 22:36   ECHOCARDIOGRAM COMPLETE  Result Date: 07/18/2019   ECHOCARDIOGRAM REPORT   Patient Name:   ROSALBA TOTTY Date of Exam: 07/18/2019 Medical Rec #:  [id number]     Height:       64.0 in Accession #:    [id number]    Weight:       202.6 lb Date of Birth:  [birth year]     BSA:          1.97 m Patient Age:    24 years      BP:  134/66 mmHg Patient Gender: F             HR:           85 bpm. Exam Location:  Inpatient Procedure: 2D Echo, Cardiac Doppler and Color Doppler Indications:    Stroke  History:        Patient has no prior history of Echocardiogram examinations.                 Stroke; Risk Factors:Hypertension and Diabetes. Cancer.                 Chemotherapy.  Sonographer:    Roseanna Rainbow RDCS Referring Phys: [id number] ASHISH ARORA IMPRESSIONS  1. Left ventricular ejection fraction, by visual estimation, is 65 to 70%. The left ventricle has hyperdynamic function. There is mildly increased left ventricular hypertrophy.  2. Left ventricular diastolic parameters are consistent with Grade I diastolic dysfunction (impaired relaxation).  3. The left ventricle has no regional wall motion abnormalities.  4. Global right ventricle has hyperdynamic systolic function.The right ventricular size is normal. No increase in right ventricular wall thickness.  5. Left atrial size was normal.  6. Right atrial size was normal.  7. Mild mitral annular calcification.  8. The mitral valve is normal in structure. No evidence of mitral valve regurgitation. No evidence of mitral stenosis.  9. The tricuspid valve is normal in structure. 10. The aortic valve is normal in structure. Aortic valve regurgitation is not visualized. Mild aortic valve sclerosis without stenosis. 11. The pulmonic valve was normal in structure. Pulmonic valve regurgitation is not visualized.  12. The inferior vena cava is normal in size with greater than 50% respiratory variability, suggesting right atrial pressure of 3 mmHg. FINDINGS  Left Ventricle: Left ventricular ejection fraction, by visual estimation, is 65 to 70%. The left ventricle has hyperdynamic function. The left ventricle has no regional wall motion abnormalities. The left ventricular internal cavity size was the left ventricle is normal in size. There is mildly increased left ventricular hypertrophy. Concentric left ventricular hypertrophy. Left ventricular diastolic parameters are consistent with Grade I diastolic dysfunction (impaired relaxation). Indeterminate filling pressures. Right Ventricle: The right ventricular size is normal. No increase in right ventricular wall thickness. Global RV systolic function is has hyperdynamic systolic function. Left Atrium: Left atrial size was normal in size. Right Atrium: Right atrial size was normal in size Pericardium: There is no evidence of pericardial effusion. Mitral Valve: The mitral valve is normal in structure. Mild mitral annular calcification. No evidence of mitral valve regurgitation. No evidence of mitral valve stenosis by observation. Tricuspid Valve: The tricuspid valve is normal in structure. Tricuspid valve regurgitation is not demonstrated. Aortic Valve: The aortic valve is normal in structure. Aortic valve regurgitation is not visualized. Mild aortic valve sclerosis is present, with no evidence of aortic valve stenosis. Pulmonic Valve: The pulmonic valve was normal in structure. Pulmonic valve regurgitation is not visualized. Pulmonic regurgitation is not visualized. Aorta: The aortic root, ascending aorta and aortic arch are all structurally normal, with no evidence of dilitation or obstruction. Venous: The inferior vena cava is normal in size with greater than 50% respiratory variability, suggesting right atrial pressure of 3 mmHg. IAS/Shunts: No atrial level shunt detected by  color flow Doppler. There is no evidence of a patent foramen ovale. No ventricular septal defect is seen or detected. There is no evidence of an atrial septal defect.  LEFT VENTRICLE PLAX 2D LVIDd:  3.52 cm       Diastology LVIDs:         2.31 cm       LV e' lateral:   7.72 cm/s LV PW:         1.29 cm       LV E/e' lateral: 10.3 LV IVS:        1.22 cm       LV e' medial:    5.66 cm/s LVOT diam:     1.50 cm       LV E/e' medial:  14.1 LV SV:         33 ml LV SV Index:   16.01         2D Longitudinal Strain LVOT Area:     1.77 cm      2D Strain GLS Avg:     -23.6 %  LV Volumes (MOD) LV area d, A2C:    18.10 cm LV area d, A4C:    20.60 cm LV area s, A2C:    8.89 cm LV area s, A4C:    8.93 cm LV major d, A2C:   6.92 cm LV major d, A4C:   6.91 cm LV major s, A2C:   5.26 cm LV major s, A4C:   5.06 cm LV vol d, MOD A2C: 39.7 ml LV vol d, MOD A4C: 50.0 ml LV vol s, MOD A2C: 12.9 ml LV vol s, MOD A4C: 13.5 ml LV SV MOD A2C:     26.8 ml LV SV MOD A4C:     50.0 ml LV SV MOD BP:      31.0 ml RIGHT VENTRICLE            IVC RV S prime:     9.03 cm/s  IVC diam: 1.84 cm TAPSE (M-mode): 1.9 cm LEFT ATRIUM             Index       RIGHT ATRIUM           Index LA diam:        3.70 cm 1.88 cm/m  RA Area:     12.00 cm LA Vol (A2C):   29.7 ml 15.10 ml/m RA Volume:   28.90 ml  14.69 ml/m LA Vol (A4C):   50.8 ml 25.82 ml/m LA Biplane Vol: 40.2 ml 20.44 ml/m  AORTIC VALVE LVOT Vmax:   117.00 cm/s LVOT Vmean:  75.000 cm/s LVOT VTI:    0.227 m  AORTA Ao Root diam: 3.20 cm Ao Asc diam:  2.80 cm MITRAL VALVE MV Area (PHT): 3.42 cm              SHUNTS MV PHT:        64.38 msec            Systemic VTI:  0.23 m MV Decel Time: 222 msec              Systemic Diam: 1.50 cm MV E velocity: 79.90 cm/s  103 cm/s MV A velocity: 114.00 cm/s 70.3 cm/s MV E/A ratio:  0.70        1.5  Mihai Croitoru MD Electronically signed by Sanda Klein MD Signature Date/Time: 07/18/2019/4:01:06 PM    Final    CUP PACEART INCLINIC DEVICE  CHECK  Result Date: 08/10/2019 ILR wound check in clinic. Outer dressing removed and small area of dried blood on dressing.Steri strips in place due to implant 07/19/19. Bruising around wound. Home monitor transmitting nightly. No episodes. Questions  answered.  CUP PACEART INCLINIC DEVICE CHECK  Result Date: 08/03/2019 ILR wound check in clinic. Steri strips removed. Wound well healed. Home monitor transmitting nightly. No episodes. Patient educated about Carelink monitor and incision care. Monthly summary reports and ROV with WC PRN.Levander Campion BSN, RN, CCDS  CUP PACEART REMOTE DEVICE CHECK  Result Date: 08/21/2019 Carelink summary report received. Battery status OK. Normal device function. No new symptom episodes, tachy episodes, brady, or pause episodes. No new AF episodes. Monthly summary reports and ROV/PRN JMoose  IR PERCUTANEOUS ART THROMBECTOMY/INFUSION INTRACRANIAL INC DIAG ANGIO  Result Date: 07/19/2019 INDICATION: Acute onset of severe aphasia. Occluded inferior division of the left middle cerebral artery distal M2 segment. EXAM: 1. EMERGENT LARGE VESSEL OCCLUSION THROMBOLYSIS (anterior CIRCULATION) CLINICAL DATA:  New onset of global aphasia. Occluded left middle cerebral artery inferior division distal M2 segment. COMPARISON:  CT angiogram of the head and neck of July 17, 2019. MEDICATIONS: Ancef 2 g IV antibiotic was administered within 1 hour of the procedure. ANESTHESIA/SEDATION: General anesthesia. CONTRAST:  Isovue 300 approximately 80 mL. FLUOROSCOPY TIME:  Fluoroscopy Time: 33 minutes 18 seconds (1688 mGy). COMPLICATIONS: None immediate. TECHNIQUE: Following a full explanation of the procedure along with the potential associated complications, an informed witnessed consent was obtained from patient's daughter in a 3 way call. The risks of intracranial hemorrhage of 10%, worsening neurological deficit, ventilator dependency, death and inability to revascularize were all reviewed  in detail with the patient's daughter. The patient was then put under general anesthesia by the Department of Anesthesiology at [facility]. The right groin was prepped and draped in the usual sterile fashion. Thereafter using modified Seldinger technique, transfemoral access into the right common femoral artery was obtained without difficulty. Over a 0.035 inch guidewire a 8 French Pinnacle sheath was inserted. Through this, and also over a 0.035 inch guidewire a 5 [place] JB 1 catheter was advanced to the aortic arch region and selectively positioned in the left common carotid artery. FINDINGS: The left common carotid arteriogram demonstrates the left external carotid artery and its major branches to be widely patent. The left internal carotid artery at the bulb demonstrates a smooth shallow plaque along the posterior wall without evidence of significant stenosis or of ulcerations. The vessel, otherwise, opacifies to the cranial skull base with mild tortuosity in the mid cervical left ICA. More distally, the left internal carotid artery is seen to opacify to the cranial skull base. The petrous, the cavernous and the supraclinoid segments are widely patent. The left middle cerebral artery and the left anterior cerebral artery opacify into the capillary and venous phases. On the lateral projection there is complete occlusion of the distal M2 segment of the inferior division of the left middle cerebral artery with moderate collateralization. A dominant right transverse sinus and sigmoid sinus is seen. PROCEDURE: The diagnostic JB 1 catheter in the left common carotid artery was exchanged over a 0.035 inch 300 cm Rosen exchange guidewire for an 087 [place] balloon guide catheter which had been prepped with 50% contrast and 50% heparinized saline infusion. The guidewire was removed. Good aspiration obtained from the hub of the balloon guide catheter. Over a 0.035 inch Roadrunner guidewire, a 6 French 132 cm  Catalyst guide catheter was then advanced to the horizontal petrous segment of the left internal carotid artery. The guidewire was removed. Good aspiration was obtained from the hub of the Catalyst guide catheter. A control arteriogram was then performed which demonstrated no change in the left  MCA inferior division occlusions. Over a 0.014 inch standard Synchro micro guidewire, an 021 150 cm Trevo ProVue microcatheter was advanced and positioned in the proximal left middle cerebral artery. The balloon guide catheter was advanced to the proximal 1/3 of the left internal carotid artery. Using a torque device, the micro guidewire was then advanced through the occluded inferior division M2 region and advanced into the M3 M4 region followed by the microcatheter. The guidewire was removed. Good aspiration was obtained from the hub of the microcatheter. A gentle control arteriogram performed through the microcatheter demonstrated safe position of tip of the microcatheter. A 4 mm x 40 mm Solitaire X retrieval device was then advanced to the distal end of the microcatheter. The 6 [place] Catalyst guide catheter was advanced to the origin of the occluded inferior division. With slight forward gentle traction with the right hand on the delivery micro guidewire, with the left hand the delivery microcatheter was retrieved deploying the retrieval device. With constant aspiration being applied at the hub of the 6 [place] Catalyst guide catheter using the Penumbra aspiration device, and a 60 mL syringe at the hub of the balloon guide catheter hub with proximal flow arrest for 2-1/2 minutes, the combination of the retrieval device, the microcatheter for 2-1/2 minutes, the retrieval device, the microcatheter and the retrieval device were retrieved and removed. Proximal flow arrest was reversed. Control arteriogram performed through the 6 [place] were retrieved and removed. Following reversal of flow arrest, a control arteriogram  performed through the Catalyst guide catheter now demonstrated moderately improved flow through the previously occluded inferior division M2 M3 region with a TICI 2c revascularization. However, there continued to be a sizable occlusion of the inferior division distal M2 region. This prompted a second pass using the above combination again advanced into the M2 M3 region of the occluded inferior division. Again the microcatheter was verified to be in a safe position prior to advancing a 3 mm x 33 mm Trevo ProVue retrieval device to the distal end of the microcatheter. With slight forward gentle traction with the right hand on the delivery micro guidewire with the left hand the delivery microcatheter was retrieved deploying the retrieval device. The 6 [place] Catalyst guide catheter was advanced just inside the origin of the inferior division of the left middle cerebral artery and Penumbra aspiration was initiated for 2-1/2 minutes. Again with proximal flow arrest and constant aspiration at the hub of the balloon guide catheter with a 60 mL syringe, the combination of the retrieval device, the microcatheter and the 6 [place] Catalyst guide catheter were retrieved and removed. Small clot was seen entangled within the retrieval device, and also in the aspirate. Following reversal of flow arrest, a control arteriogram performed through the balloon guide catheter in the left internal carotid artery demonstrated complete angiographic revascularization of the occluded inferior division of the left middle cerebral artery achieving a TICI 3 revascularization. Throughout the procedure, the patient's blood pressure and neurological status remained stable. No angiographic evidence of mass effect or midline shift or of extravasation was seen. The 6 [place] Catalyst guide catheter and the balloon guide catheter were retrieved and removed. The 8 French Pinnacle sheath in the right groin was removed with the successful application of an  8 French Angio-Seal closure device for hemostasis. Distal pulses remained palpable in the dorsalis pedis, and the posterior tibial arteries bilaterally unchanged. A flat panel CT of the brain revealed no evidence of mass effect or midline shift. A small amount  of linear hypo attenuation was seen in the posterior peri sylvian fissure probably representing contrast leak with slight subarachnoid hemorrhage. The patient's general anesthesia was then reversed and the patient was extubated. The patient was able to move all fours equally. She responded to name. However, she continued to have significant expressive and comprehension difficulties. She was then transferred to ICU to continue with post thrombectomy management. IMPRESSION: Status post endovascular complete revascularization of left middle cerebral artery inferior division in the M2 distal segment with 1 pass with the 4 mm x 40 mm Solitaire X retrieval device, and a 3 mm x 33 mm Penumbra Trevo ProVue retrieval device and Penumbra aspiration achieving a TICI 3 revascularization. PLAN: Follow-up in the clinic 4 weeks post discharge. Electronically Signed   By: Luanne Bras M.D.   On: 07/18/2019 13:40   CT HEAD CODE STROKE WO CONTRAST  Result Date: 07/17/2019 CLINICAL DATA:  Code stroke. 69 year old female with aphasia. Last seen normal 2038 hours. EXAM: CT HEAD WITHOUT CONTRAST TECHNIQUE: Contiguous axial images were obtained from the base of the skull through the vertex without intravenous contrast. COMPARISON:  None. FINDINGS: Brain: Cerebral volume is within normal limits for age. No ventriculomegaly. No midline shift, mass effect, or evidence of intracranial mass lesion. No acute intracranial hemorrhage identified. Gray-white matter differentiation appears normal in the right hemisphere, deep gray nuclei and posterior fossa. And no cytotoxic edema is identified in the left MCA territory. Vascular: Calcified atherosclerosis at the skull base.  Asymmetric vascular hyperdensity at the left MCA origin and in the sylvian fissure on series 3, images 11 and 12. Skull: No acute osseous abnormality identified. Sinuses/Orbits: Visualized paranasal sinuses and mastoids are clear. Other: No acute orbit or scalp soft tissue findings. ASPECTS [facility] Stroke Program Early CT Score) Total score (0-10 with 10 being normal): 10 IMPRESSION: 1. Positive for asymmetrically hyperdense Left MCA suspicious for emergent large vessel occlusion in this setting, but normal for age non contrast CT appearance of the brain. 2.  ASPECTS 10. 3. These results were communicated to Dr. Rory Percy at 10:01 pm on 07/17/2019 by text page via the [facility] messaging system. Electronically Signed   By: Genevie Ann M.D.   On: 07/17/2019 22:02   VAS [place] LOWER EXTREMITY VENOUS (DVT)  Result Date: 07/18/2019  Lower Venous Study Indications: Stroke, and s/p bilateral knee procedures.  Comparison Study: No prior study. Performing Technologist: Oliver Hum RVT  Examination Guidelines: A complete evaluation includes B-mode imaging, spectral Doppler, color Doppler, and power Doppler as needed of all accessible portions of each vessel. Bilateral testing is considered an integral part of a complete examination. Limited examinations for reoccurring indications may be performed as noted.  +---------+---------------+---------+-----------+----------+--------------+ RIGHT    CompressibilityPhasicitySpontaneityPropertiesThrombus Aging +---------+---------------+---------+-----------+----------+--------------+ CFV      Full           Yes      Yes                                 +---------+---------------+---------+-----------+----------+--------------+ SFJ      Full                                                        +---------+---------------+---------+-----------+----------+--------------+ FV Prox  Full                                                         +---------+---------------+---------+-----------+----------+--------------+  FV Mid   Full                                                        +---------+---------------+---------+-----------+----------+--------------+ FV DistalFull                                                        +---------+---------------+---------+-----------+----------+--------------+ PFV      Full                                                        +---------+---------------+---------+-----------+----------+--------------+ POP      Full           Yes      Yes                                 +---------+---------------+---------+-----------+----------+--------------+ PTV      Full                                                        +---------+---------------+---------+-----------+----------+--------------+ PERO     Full                                                        +---------+---------------+---------+-----------+----------+--------------+   +---------+---------------+---------+-----------+----------+--------------+ LEFT     CompressibilityPhasicitySpontaneityPropertiesThrombus Aging +---------+---------------+---------+-----------+----------+--------------+ CFV      Full           Yes      Yes                                 +---------+---------------+---------+-----------+----------+--------------+ SFJ      Full                                                        +---------+---------------+---------+-----------+----------+--------------+ FV Prox  Full                                                        +---------+---------------+---------+-----------+----------+--------------+ FV Mid   Full                                                        +---------+---------------+---------+-----------+----------+--------------+  FV DistalFull                                                         +---------+---------------+---------+-----------+----------+--------------+ PFV      Full                                                        +---------+---------------+---------+-----------+----------+--------------+ POP      Full           Yes      Yes                                 +---------+---------------+---------+-----------+----------+--------------+ PTV      Full                                                        +---------+---------------+---------+-----------+----------+--------------+ PERO     Full                                                        +---------+---------------+---------+-----------+----------+--------------+     Summary: Right: There is no evidence of deep vein thrombosis in the lower extremity. No cystic structure found in the popliteal fossa. Left: There is no evidence of deep vein thrombosis in the lower extremity. No cystic structure found in the popliteal fossa.  *See table(s) above for measurements and observations. Electronically signed by Deitra Mayo MD on 07/18/2019 at 4:10:55 PM.    Final    .   Outpatient Encounter Medications as of 09/19/2019  Medication Sig  . acetaminophen (TYLENOL) 500 MG tablet Take 1,000 mg by mouth every 6 (six) hours as needed for mild pain or moderate pain.  Marland Kitchen amitriptyline (ELAVIL) 50 MG tablet Take 1 tablet (50 mg total) by mouth at bedtime.  Marland Kitchen amoxicillin (AMOXIL) 500 MG capsule Take 2,000 mg by mouth once as needed (dental work).  Marland Kitchen aspirin EC 81 MG tablet Take 81 mg by mouth daily.  Marland Kitchen atenolol (TENORMIN) 50 MG tablet Take 75 mg by mouth daily.  Marland Kitchen atorvastatin (LIPITOR) 40 MG tablet Take 1 tablet (40 mg total) by mouth at bedtime.  . Biotin 5000 MCG TABS Take 5,000 mcg by mouth daily.  . cholecalciferol (VITAMIN D) 400 units TABS tablet Take 400 Units by mouth daily.  . cyclobenzaprine (FLEXERIL) 5 MG tablet Take 1 tablet (5 mg total) by mouth 3 (three) times daily. (Patient taking  differently: Take 5-10 mg by mouth at bedtime as needed for muscle spasms. )  . lisinopril (ZESTRIL) 10 MG tablet Take 10 mg by mouth daily.  . metFORMIN (GLUCOPHAGE) 500 MG tablet TAKE 1 TABLET BY MOUTH TWICE A DAY WITH MEALS  . Omega-3 Fatty Acids (FISH OIL) 1200 MG CAPS Take 1,200 mg by mouth daily.   Marland Kitchen  omeprazole (PRILOSEC) 40 MG capsule Take 1 capsule (40 mg total) by mouth daily.  Marland Kitchen Propylene Glycol (SYSTANE BALANCE) 0.6 % SOLN Place 2 drops into both eyes 2 (two) times daily.   Marland Kitchen saccharomyces boulardii (FLORASTOR) 250 MG capsule Take 250 mg by mouth 2 (two) times daily as needed (for one week following dental procedure).  . traMADol (ULTRAM) 50 MG tablet Take 50-100 mg by mouth at bedtime as needed for pain.   No facility-administered encounter medications on file as of 09/19/2019.   Allergies  Allergen Reactions  . Amlodipine     Blurred vision   . Compazine [Prochlorperazine Edisylate]     Stroke symptoms,  . Gabapentin     Blurred vision and nystagmus  . Lyrica [Pregabalin]     Blurred Vision   . Sertraline Other (See Comments)    Visual disturbances.   . Chlorhexidine Rash    Blisters and redness to chest    Past Medical History:  Diagnosis Date  . Anxiety   . Arthritis   . Cataract    bilatereral cat. ext. with lens implant  . Depression   . Diabetes mellitus without complication (HCC)    diet controlled. States always has borderline HgbA1C  . Endometrial cancer ([place])   . Family history of colon cancer   . Family history of genetic mutation for hereditary nonpolyposis colorectal cancer (HNPCC)   . GERD (gastroesophageal reflux disease)   . High cholesterol   . History of kidney stones    years   . Hypertension   . Neuropathy   . Sensation of pressure in bladder area   . Stroke ([place]) 07/17/2019  . Uterine cramping    Past Surgical History:  Procedure Laterality Date  . burnt the nerves  Right 04/29/2018   in right knee   . burnt the nerves  Left 04/22/2018    left knee nerve   . cystoscopy kidney stone   1985  . IR CT HEAD LTD  07/18/2019  . IR IMAGING GUIDED PORT INSERTION  06/15/2018  . IR PERCUTANEOUS ART THROMBECTOMY/INFUSION INTRACRANIAL INC DIAG ANGIO  07/18/2019  . IR REMOVAL TUN ACCESS W/ PORT W/O FL MOD SED  12/01/2018  . LOOP RECORDER INSERTION N/A 07/19/2019   Procedure: LOOP RECORDER INSERTION;  Surgeon: Constance Haw, MD;  Location: [place] [facility];  Service: Cardiovascular;  Laterality: N/A;  . RADIOLOGY WITH ANESTHESIA N/A 07/17/2019   Procedure: IR WITH ANESTHESIA;  Surgeon: Luanne Bras, MD;  Location: [place];  Service: Radiology;  Laterality: N/A;  . REPLACEMENT TOTAL KNEE BILATERAL Bilateral 2018   Right x 2. 2019 undergoing intermittent injection "RFA" procedures with Dr. Sheliah Mends [facility]  . REVISION TOTAL KNEE ARTHROPLASTY Right 03/2017  . ROBOTIC ASSISTED TOTAL HYSTERECTOMY WITH BILATERAL SALPINGO OOPHERECTOMY N/A 05/03/2018   Procedure: XI ROBOTIC ASSISTED TOTAL HYSTERECTOMY WITH BILATERAL SALPINGO OOPHORECTOMY;  Surgeon: Everitt Amber, MD;  Location: [facility];  Service: Gynecology;  Laterality: N/A;  . SENTINEL NODE BIOPSY N/A 05/03/2018   Procedure: SENTINEL NODE BIOPSY;  Surgeon: Everitt Amber, MD;  Location: [facility];  Service: Gynecology;  Laterality: N/A;  . TONSILLECTOMY AND ADENOIDECTOMY    . TUBAL LIGATION          Past Gynecological History:   GYNECOLOGIC HISTORY:  . No LMP recorded. Patient has had a hysterectomy. 12 . Menarche: 69 years old . P 2 . Contraceptive desogen OCP and diaphragm history . HRT none  . Last Pap March 2019 "states  normal" Family Hx:  Family History  Adopted: Yes  Problem Relation Age of Onset  . Colon cancer Daughter 33       Lynch syndrome - MSH6 +  . Healthy Son    Social Hx:  Marland Kitchen Tobacco use: current every day . Alcohol use: 2 glasses wine daily, negative CAGE . Illicit Drug use: none . Illicit IV Drug use: none    Review of Systems: Review of Systems   Constitutional: Negative.   HENT:  Negative.   Eyes: Negative.   Respiratory: Negative.   Cardiovascular: Negative.   Gastrointestinal: Negative.   Endocrine: Negative.   Genitourinary: Negative.    Musculoskeletal: Positive for arthralgias.  Neurological: Negative.   Hematological: Negative.   Psychiatric/Behavioral: The patient is nervous/anxious.   All other systems reviewed and are negative. +weight gain  Vitals:  Vitals:   09/19/19 1336  BP: 114/69  Pulse: 76  Resp: 17  Temp: 98.3 F (36.8 C)  SpO2: 94%   Vitals:   09/19/19 1336  Weight: 202 lb 12.8 oz (92 kg)  Height: 5' 4" (1.626 m)   Body mass index is 34.81 kg/m.  Physical Exam: General :  Overweight.Well developed, 69 y.o., female in no apparent distress HEENT:  Normocephalic/atraumatic, symmetric, EOMI, eyelids normal Neck:   Supple, no masses.  Lymphatics:  No cervical/ submandibular/ supraclavicular/ infraclavicular/ inguinal adenopathy Respiratory:  Respirations unlabored, no use of accessory muscles CV:   Deferred Breast:  Deferred Musculoskeletal: No CVA tenderness, normal muscle strength. Abdomen:  Soft, non-tender and nondistended. No evidence of hernia. No masses. Extremities:  No lymphedema, no erythema, non-tender. Skin:   Normal inspection Neuro/Psych:  No focal motor deficit, no abnormal mental status. Normal gait. Normal affect. Alert and oriented to person, place, and time. Well healed incisions.  Genito Urinary: Vaginal cuff well healed, no lesions or masses. Smooth, no lesions Rectovaginal:  Soft rectovaginal tissues, no palpable nodules.   Thereasa Solo, MD  Cc: Dr. Lavonia Drafts (Referring Ob/Gyn) Javier Glazier, MD (PCP)

## 2019-09-19 NOTE — Patient Instructions (Signed)
Plan to follow up in three months time.

## 2019-09-21 LAB — CUP PACEART REMOTE DEVICE CHECK
Date Time Interrogation Session: 20210317220525
Implantable Pulse Generator Implant Date: 20210113

## 2019-09-21 NOTE — Progress Notes (Signed)
ILR Remote 

## 2019-09-26 DIAGNOSIS — I63412 Cerebral infarction due to embolism of left middle cerebral artery: Secondary | ICD-10-CM

## 2019-10-09 ENCOUNTER — Other Ambulatory Visit: Payer: Self-pay | Admitting: *Deleted

## 2019-10-09 DIAGNOSIS — G43611 Persistent migraine aura with cerebral infarction, intractable, with status migrainosus: Secondary | ICD-10-CM

## 2019-10-09 MED ORDER — AMITRIPTYLINE HCL 50 MG PO TABS: 50.0000 mg | ORAL_TABLET | Freq: Every day | ORAL | 0 refills | Status: DC

## 2019-10-19 ENCOUNTER — Ambulatory Visit (INDEPENDENT_AMBULATORY_CARE_PROVIDER_SITE_OTHER): Payer: Medicare Other | Admitting: *Deleted

## 2019-10-19 ENCOUNTER — Other Ambulatory Visit: Payer: Self-pay

## 2019-10-19 DIAGNOSIS — I63412 Cerebral infarction due to embolism of left middle cerebral artery: Secondary | ICD-10-CM

## 2019-10-19 NOTE — Patient Outreach (Signed)
Telephone outreach to patient to obtain mRS was successfully completed. MRS=0  Also, patient added she had nothing but praises for the great patient experience she had with the inpatient stroke team.  CMA thanked patient for the positive feedback.  Ina Homes [facility] Management Assistant [phone]

## 2019-10-22 LAB — CUP PACEART REMOTE DEVICE CHECK
Date Time Interrogation Session: 20210417220322
Implantable Pulse Generator Implant Date: 20210113

## 2019-10-23 NOTE — Progress Notes (Signed)
ILR Remote 

## 2019-10-30 ENCOUNTER — Encounter: Payer: Self-pay | Admitting: *Deleted

## 2019-10-30 ENCOUNTER — Telehealth: Payer: Self-pay | Admitting: Adult Health

## 2019-10-30 NOTE — Telephone Encounter (Signed)
Pt is asking for a call from RN to discuss her taking  baby asprin and amitriptyline

## 2019-10-30 NOTE — Telephone Encounter (Signed)
I called and LMVM for pt that returned her call.  If ? Was related to interaction I see no interaction with taking both these meds, if something else to call us back.

## 2019-10-30 NOTE — Telephone Encounter (Signed)
I called pt and she is asking to have clearance for her upcoming hip injection fluoscopy due 11-21-19 (needing to be off aspirin 81mg  5-7 days prior to procedure).  Dr. Arrie Eastern [phone] [facility].  Pleawe advise.  Also her amitriptyline 50mg  po daily is working Recruitment consultant for her headaches.  Will addres her question (stay on for ow long) when in for appt in May 2021.

## 2019-10-30 NOTE — Telephone Encounter (Signed)
Patient had cryptogenic stroke in 07/2019.  At the time of scheduled injection, she will be approximately 5 months post stroke therefore can hold aspirin 5 to 7 days (as requested) prior to injection with a small but acceptable preprocedure risk of recurrent stroke while off therapy.  Recommend restarting aspirin immediately after procedure or once hemodynamically stable.

## 2019-10-30 NOTE — Telephone Encounter (Signed)
Pt returning call- pt is going into a meeting til about 2 and would like a call afterwards.

## 2019-11-01 NOTE — Telephone Encounter (Addendum)
I called [facility] [phone], fax is [phone] Dr. Arrie Eastern.  Fax confirmation received.

## 2019-11-01 NOTE — Telephone Encounter (Signed)
Pt called wanting to speak to RN about baby aspirin.

## 2019-11-01 NOTE — Telephone Encounter (Addendum)
I spoke to pt and relayed the note concerning clearance for her procedure (injection).  Will send to Dr. Arrie Eastern for his record.  She verbalized understanding. Attempted to call and get fax #, but mailbox full.

## 2019-11-09 IMAGING — CT CT ABDOMEN AND PELVIS WITH CONTRAST
2 of 8 series · 14 of 46 positions shown, 18 images · IV contrast (OMNIPAQUE)
Comparison: 04/27/2018.

CLINICAL DATA: Endometrial cancer. Status post hysterectomy.

EXAM:
CT ABDOMEN AND PELVIS WITH CONTRAST
TECHNIQUE: Multidetector CT imaging of the abdomen and pelvis was performed
using the standard protocol following bolus administration of
intravenous contrast.
CONTRAST:  100mL OMNIPAQUE IOHEXOL 300 MG/ML  SOLN

[Series 2: axial st · axial · 0.87mm/px · z∈[-467,-77]mm · 11 of 90 slices shown, 15 images]
[im 6/90  soft-tissue]
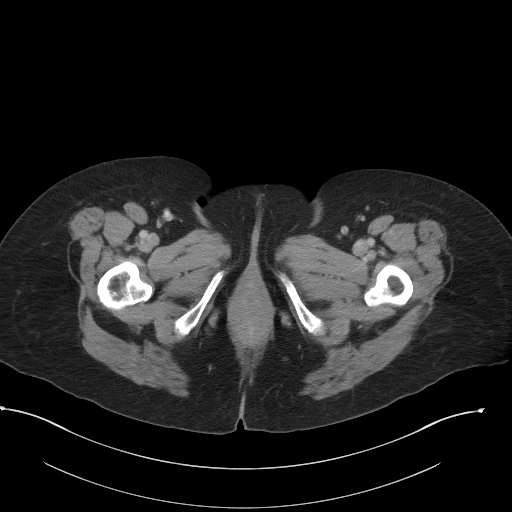
[im 6/90  bone]
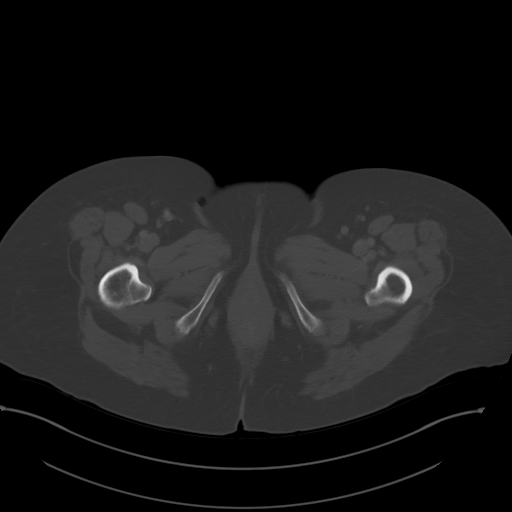
[im 17/90  soft-tissue]
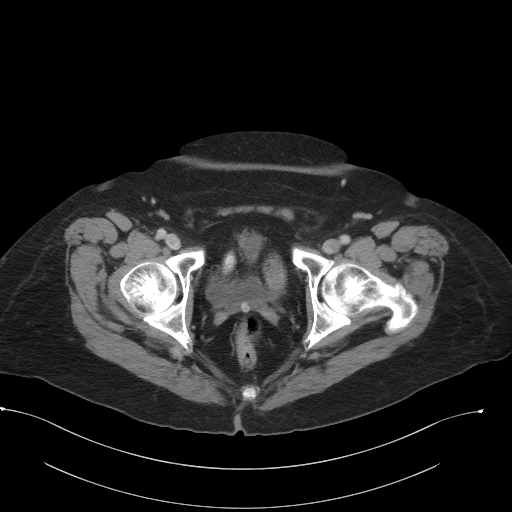
[im 28/90  soft-tissue]
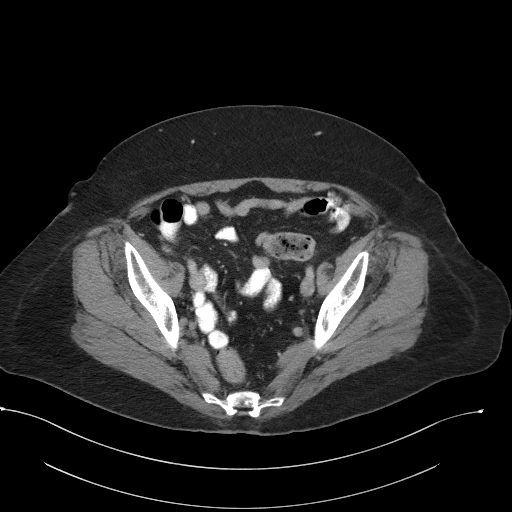
[im 34/90  soft-tissue]
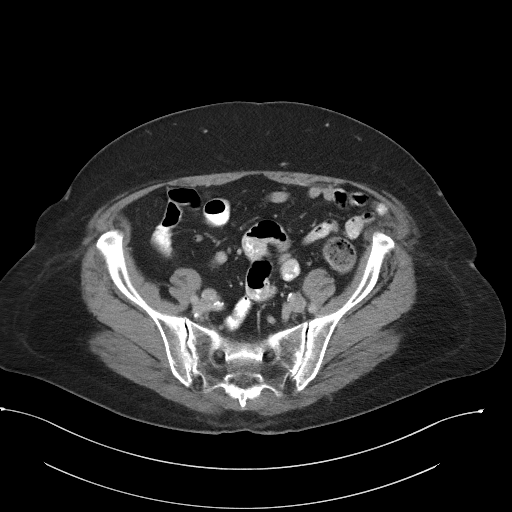
[im 45/90  soft-tissue]
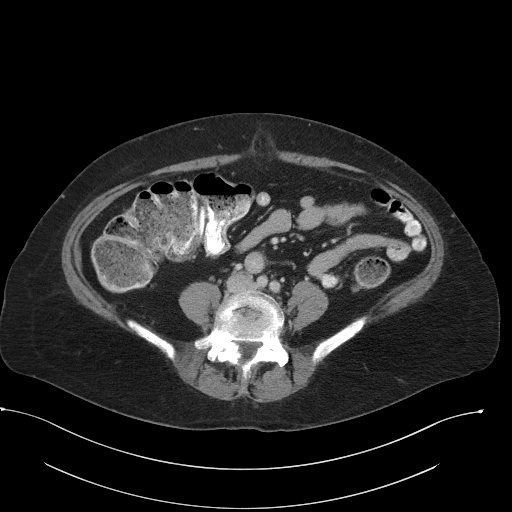
[im 56/90  soft-tissue]
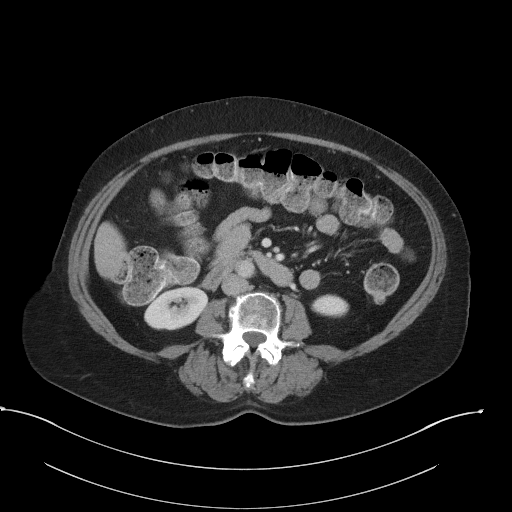
[im 62/90  soft-tissue]
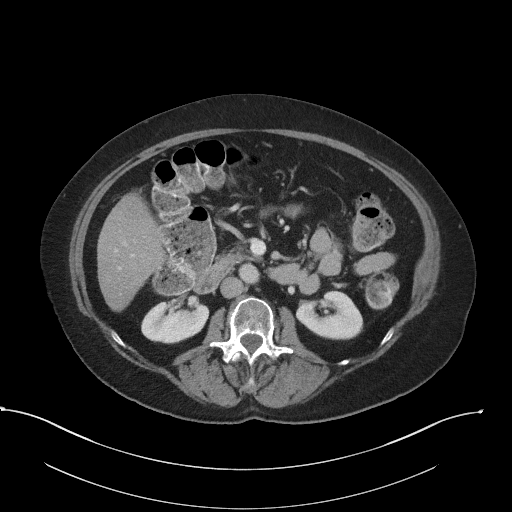
[im 67/90  lung]
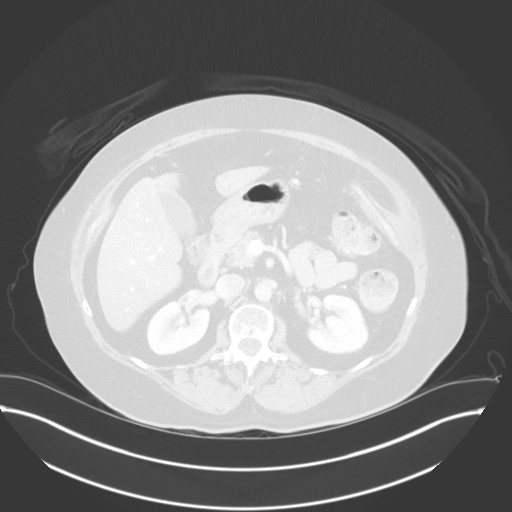
[im 73/90  soft-tissue]
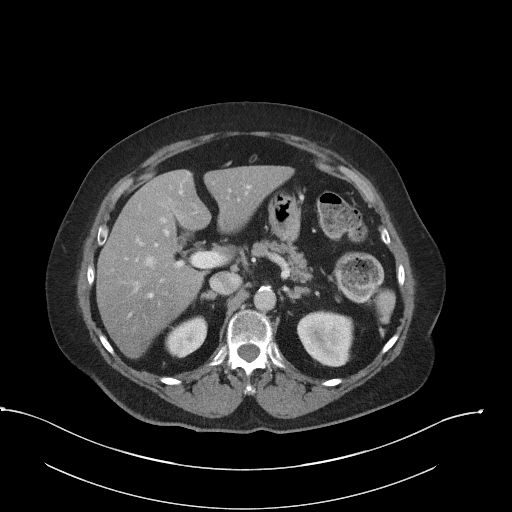
[im 73/90  lung]
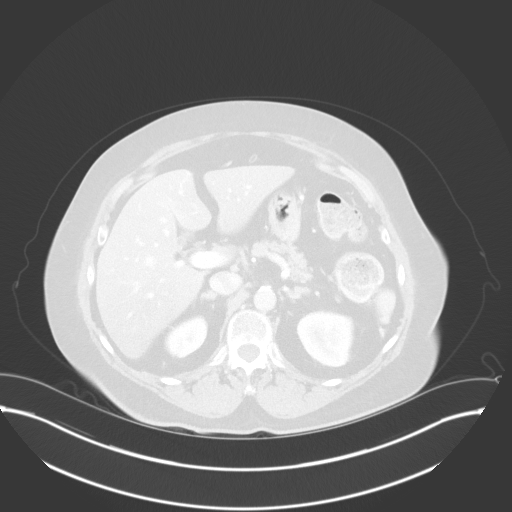
[im 78/90  lung]
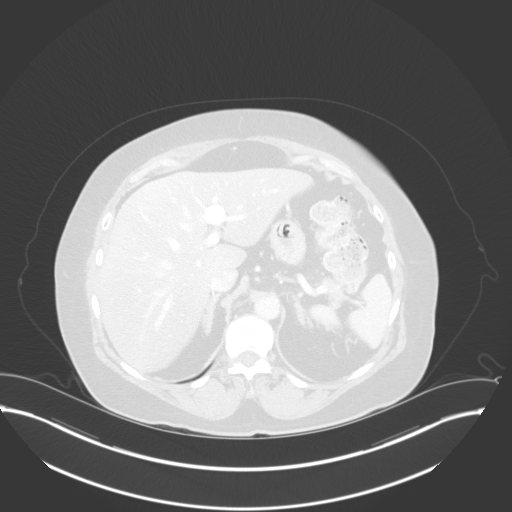
[im 84/90  soft-tissue]
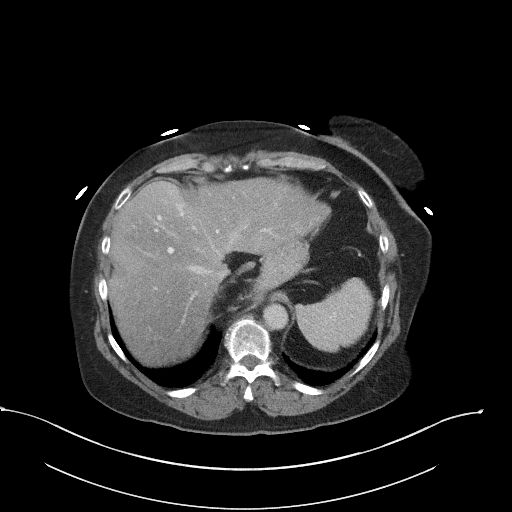
[im 84/90  lung]
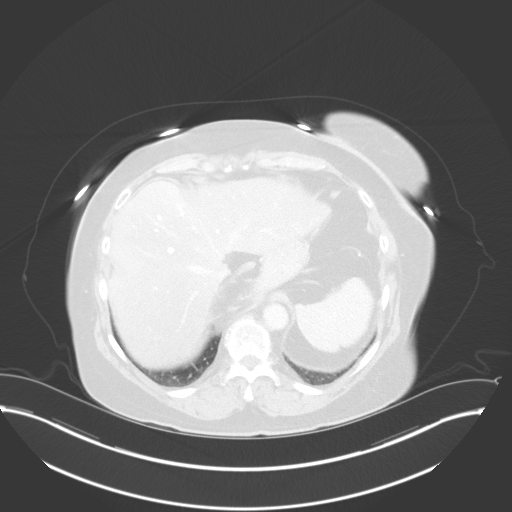
[im 84/90  bone]
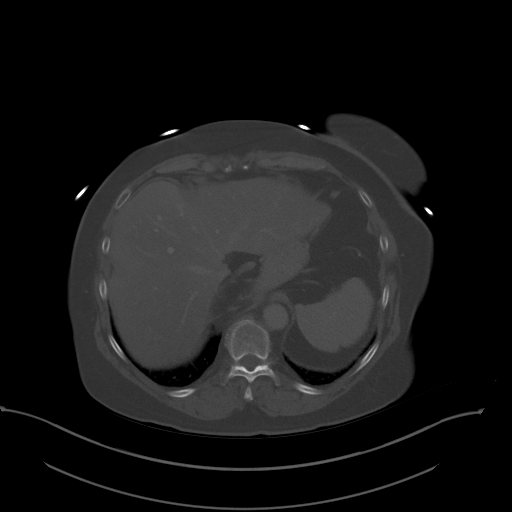

[Series 8: coronal st · coronal · 0.77mm/px · 3 of 98 slices shown]
[im 25/98  soft-tissue]
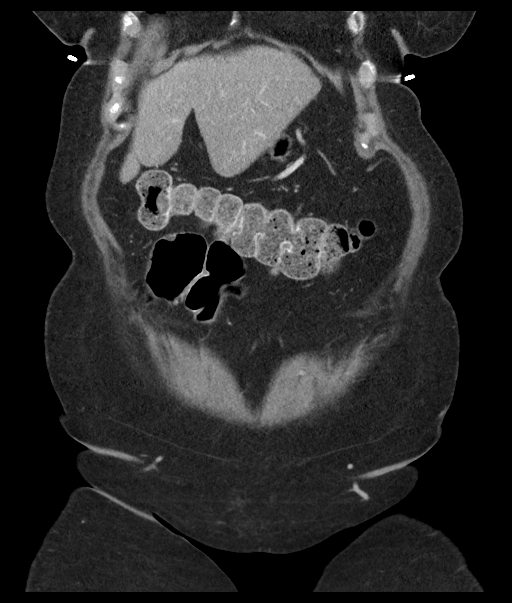
[im 49/98  soft-tissue]
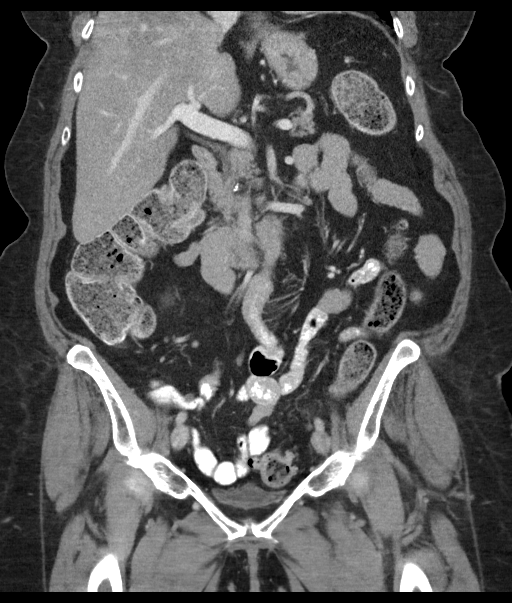
[im 73/98  soft-tissue]
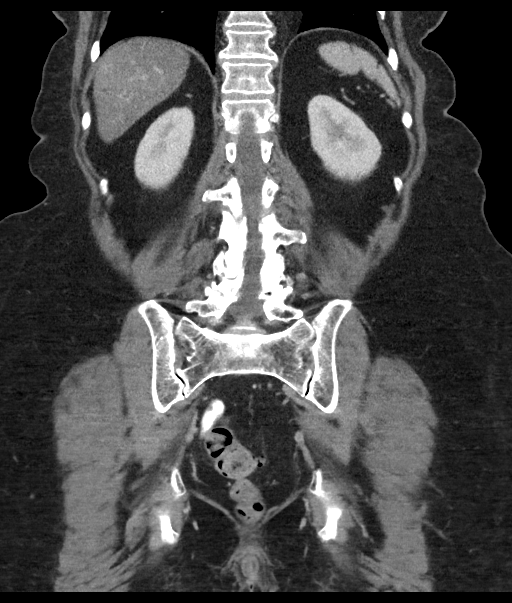

[14 of 46 positions shown; findings below may reference images not displayed]

FINDINGS: Lower chest: Large hiatal hernia, incompletely visualized.

Hepatobiliary: The liver shows diffusely decreased attenuation
suggesting steatosis. No suspicious focal abnormality within the
liver parenchyma. There is no evidence for gallstones, gallbladder
wall thickening, or pericholecystic fluid. No intrahepatic or
extrahepatic biliary dilation.

Pancreas: No focal mass lesion. No dilatation of the main duct. No
intraparenchymal cyst. No peripancreatic edema.

Spleen: No splenomegaly. No focal mass lesion.

Adrenals/Urinary Tract: No adrenal nodule or mass. Kidneys
unremarkable. No evidence for hydroureter. Bladder decompressed.

Stomach/Bowel: Large hiatal hernia, incompletely visualized.
Duodenum is normally positioned as is the ligament of Treitz. No
small bowel wall thickening. No small bowel dilatation. The terminal
ileum is normal. The appendix is normal. Large colonic stool volume
noted. Diverticular changes are noted in the left colon without
evidence of diverticulitis.

Vascular/Lymphatic: There is abdominal aortic atherosclerosis
without aneurysm. There is no gastrohepatic or hepatoduodenal
ligament lymphadenopathy. No intraperitoneal or retroperitoneal
lymphadenopathy. No pelvic sidewall lymphadenopathy.

Reproductive: The uterus is surgically absent. 8 mm hyperattenuating
nodule is seen along the vaginal cuff and the increased density may
be related to enhancement. There is no adnexal mass.

Other: No intraperitoneal free fluid. No omental or mesenteric
nodularity.

Musculoskeletal: No worrisome lytic or sclerotic osseous
abnormality.
IMPRESSION: 1. 8 mm hyperattenuating nodule along the midline vaginal cuff.
Coronal imaging suggests that the hyperattenuation is related to
calcification, but enhancement in this nodule cannot be excluded.
Patient is status post hysterectomy in the interval since prior CT.
While this finding may be related to prior surgery, close follow-up
recommended.
2. Otherwise no evidence for metastatic disease in the abdomen or
pelvis.
3. Large hiatal hernia with more than 50% of the stomach herniated
into the lower chest, stable.
4.  Aortic Atherosclerois ([id number]-170.0)

## 2019-11-20 ENCOUNTER — Ambulatory Visit (INDEPENDENT_AMBULATORY_CARE_PROVIDER_SITE_OTHER): Payer: Medicare Other | Admitting: *Deleted

## 2019-11-20 DIAGNOSIS — I63412 Cerebral infarction due to embolism of left middle cerebral artery: Secondary | ICD-10-CM | POA: Diagnosis not present

## 2019-11-21 DIAGNOSIS — M1611 Unilateral primary osteoarthritis, right hip: Secondary | ICD-10-CM | POA: Insufficient documentation

## 2019-11-21 DIAGNOSIS — M25559 Pain in unspecified hip: Secondary | ICD-10-CM | POA: Insufficient documentation

## 2019-11-22 LAB — CUP PACEART REMOTE DEVICE CHECK
Date Time Interrogation Session: 20210518220540
Implantable Pulse Generator Implant Date: 20210113

## 2019-11-22 NOTE — Progress Notes (Signed)
Carelink Summary Report / Loop Recorder 

## 2019-11-29 ENCOUNTER — Other Ambulatory Visit: Payer: Self-pay

## 2019-11-29 ENCOUNTER — Ambulatory Visit (INDEPENDENT_AMBULATORY_CARE_PROVIDER_SITE_OTHER): Payer: Medicare Other | Admitting: Adult Health

## 2019-11-29 ENCOUNTER — Encounter: Payer: Self-pay | Admitting: Adult Health

## 2019-11-29 VITALS — BP 118/62 | HR 82 | Ht 64.0 in | Wt 197.0 lb

## 2019-11-29 DIAGNOSIS — I10 Essential (primary) hypertension: Secondary | ICD-10-CM

## 2019-11-29 DIAGNOSIS — G43611 Persistent migraine aura with cerebral infarction, intractable, with status migrainosus: Secondary | ICD-10-CM | POA: Diagnosis not present

## 2019-11-29 DIAGNOSIS — I639 Cerebral infarction, unspecified: Secondary | ICD-10-CM | POA: Diagnosis not present

## 2019-11-29 DIAGNOSIS — E785 Hyperlipidemia, unspecified: Secondary | ICD-10-CM | POA: Diagnosis not present

## 2019-11-29 DIAGNOSIS — E119 Type 2 diabetes mellitus without complications: Secondary | ICD-10-CM

## 2019-11-29 MED ORDER — AMITRIPTYLINE HCL 50 MG PO TABS: 50.0000 mg | ORAL_TABLET | Freq: Every day | ORAL | 3 refills | Status: DC

## 2019-11-29 NOTE — Progress Notes (Signed)
[facility] [address] [place]. [place] [address] [phone]       STROKE FOLLOW UP NOTE  [name] Date of Birth:  1951-06-12 Medical Record Number:  [id number]   Reason for Referral: stroke follow up    CHIEF COMPLAINT:  Chief Complaint  Patient presents with  . Follow-up    Rm 9 here for a f/u on a stroke. pt has no new sx.    HPI:  Today, 11/29/2019, Jordan Martinez is being seen for follow-up regarding left MCA stroke in 07/2018.  She has been doing well from a stroke standpoint without new or reoccurring stroke/TIA symptoms.  Underlying chronic headaches worsened post stroke and initiated amitriptyline with eventual increase to 92m with great benefit. Reports mild side effects such as mild fatigue the following day and mild skin photosensitivity. She does endorse improvement of anxiety as well with use of amitriptyline. Continues on aspirin and atorvastatin for secondary stroke prevention.  Blood pressure today 118/62.  Loop recorder has not shown atrial fibrillation thus far.  No further concerns at this time.    History provided for reference purposes only Initial visit 08/23/2019 JM: [name] a 69year old female who is being seen today for hospital follow up.  She is a retired pMarine scientist  Recovered well from a stroke standpoint without residual speech deficits.  Prior history of frontal headaches worsened with eyestrain and was told due to prior chemo treatments.  Headache intensity worsened post stroke gradually returning to baseline but continues to experience frequently.  At times she also experienced cloudiness but this has been unchanged.  She will be seeing her ophthalmologist next week for 1 year follow-up.  Describes headaches as a pressure sensation in frontal region and behind eyes bilaterally associated with photophobia and phonophobia but denies nausea/vomiting.  She will occasionally experience small flicker of  light in her periphery prior to headache onset and is unsure if headaches are worse when she experiences visual aura.  She has not previously been treated for headaches or been diagnosed with migraine headaches.  She also endorses increased anxiety due to fear of recurrent stroke.  She has completed 3 weeks DAPT and continues on aspirin alone without bleeding or bruising.  Continues on atorvastatin 40 mg daily without myalgias.  Blood pressure today satisfactory at 130/70.  She has not had follow-up with IR as recommended post revascularization during admission.  Loop recorder is not shown atrial fibrillation thus far.  No further concerns at this time.  Hospital admission 07/17/2019: Jordan Martinez 69y.o.femalewith history of diabetes, endometrial cancer, HNPCC,hyperlipidemia, hypertensionwho presented on 07/17/2019 with aphasia.  Evaluated by stroke team and  Dr. XErlinda Hongwith stroke work-up revealing left MCA infarct due to left M2 occlusion s/p TPA and IR with TICI 3 revascularization of distal left M2 with resultant small SAH left sylvian fissure, infarct embolic secondary to unknown source.  2D echo unremarkable.  LE venous Doppler negative.  Loop recorder placed to rule out A. fib as potential stroke etiology.  Recommended DAPT for 3 weeks and aspirin alone.  Initiated atorvastatin 40 mg daily with LDL 120.  HTN stable.  Controlled DM with A1c 7.0.  Current tobacco use with smoking cessation counseling provided.  Other stroke risk factors include advanced age, EtOH use and obesity but no prior history of stroke.  [facility]     ROS:   14 system review of systems performed  and negative with exception of headaches, anxiety  PMH:  Past Medical History:  Diagnosis Date  . Anxiety   . Arthritis   . Cataract    bilatereral cat. ext. with lens implant  . Depression   . Diabetes mellitus without complication (HCC)    diet controlled.  States always has borderline HgbA1C  . Endometrial cancer ([place])   . Family history of colon cancer   . Family history of genetic mutation for hereditary nonpolyposis colorectal cancer (HNPCC)   . GERD (gastroesophageal reflux disease)   . High cholesterol   . History of kidney stones    years   . Hypertension   . Neuropathy   . Sensation of pressure in bladder area   . Stroke ([place]) 07/17/2019  . Uterine cramping     PSH:  Past Surgical History:  Procedure Laterality Date  . burnt the nerves  Right 04/29/2018   in right knee   . burnt the nerves  Left 04/22/2018   left knee nerve   . cystoscopy kidney stone   1985  . IR CT HEAD LTD  07/18/2019  . IR IMAGING GUIDED PORT INSERTION  06/15/2018  . IR PERCUTANEOUS ART THROMBECTOMY/INFUSION INTRACRANIAL INC DIAG ANGIO  07/18/2019  . IR REMOVAL TUN ACCESS W/ PORT W/O FL MOD SED  12/01/2018  . LOOP RECORDER INSERTION N/A 07/19/2019   Procedure: LOOP RECORDER INSERTION;  Surgeon: Constance Haw, MD;  Location: [place] [facility];  Service: Cardiovascular;  Laterality: N/A;  . RADIOLOGY WITH ANESTHESIA N/A 07/17/2019   Procedure: IR WITH ANESTHESIA;  Surgeon: Luanne Bras, MD;  Location: [place];  Service: Radiology;  Laterality: N/A;  . REPLACEMENT TOTAL KNEE BILATERAL Bilateral 2018   Right x 2. 2019 undergoing intermittent injection "RFA" procedures with Dr. Sheliah Mends [facility]  . REVISION TOTAL KNEE ARTHROPLASTY Right 03/2017  . ROBOTIC ASSISTED TOTAL HYSTERECTOMY WITH BILATERAL SALPINGO OOPHERECTOMY N/A 05/03/2018   Procedure: XI ROBOTIC ASSISTED TOTAL HYSTERECTOMY WITH BILATERAL SALPINGO OOPHORECTOMY;  Surgeon: Everitt Amber, MD;  Location: [facility];  Service: Gynecology;  Laterality: N/A;  . SENTINEL NODE BIOPSY N/A 05/03/2018   Procedure: SENTINEL NODE BIOPSY;  Surgeon: Everitt Amber, MD;  Location: [facility];  Service: Gynecology;  Laterality: N/A;  . TONSILLECTOMY AND ADENOIDECTOMY    . TUBAL LIGATION      Social History:    Social History   Socioeconomic History  . Marital status: Divorced    Spouse name: Not on file  . Number of children: 2  . Years of education: Not on file  . Highest education level: Not on file  Occupational History  . Occupation: retired Marine scientist  Tobacco Use  . Smoking status: Current Every Day Smoker    Packs/day: 0.50    Years: 40.00    Pack years: 20.00    Types: Cigarettes  . Smokeless tobacco: Never Used  . Tobacco comment: 4-5 loose cigarettes per day , over 20 year hx of smoking   Substance and Sexual Activity  . Alcohol use: Yes    Comment: 2 glasses wine daily.   . Drug use: Never  . Sexual activity: Not Currently    Birth control/protection: None  Other Topics Concern  . Not on file  Social History Narrative  . Not on file   Social Determinants of Health   Financial Resource Strain:   . Difficulty of Paying Living Expenses:   Food Insecurity:   . Worried About Charity fundraiser in the Last  Year:   . Ran Out of Food in the Last Year:   Transportation Needs:   . Film/video editor (Medical):   Marland Kitchen Lack of Transportation (Non-Medical):   Physical Activity:   . Days of Exercise per Week:   . Minutes of Exercise per Session:   Stress:   . Feeling of Stress :   Social Connections:   . Frequency of Communication with Friends and Family:   . Frequency of Social Gatherings with Friends and Family:   . Attends Religious Services:   . Active Member of Clubs or Organizations:   . Attends Archivist Meetings:   Marland Kitchen Marital Status:   Intimate Partner Violence:   . Fear of Current or Ex-Partner:   . Emotionally Abused:   Marland Kitchen Physically Abused:   . Sexually Abused:     Family History:  Family History  Adopted: Yes  Problem Relation Age of Onset  . Colon cancer Daughter 45       Lynch syndrome - MSH6 +  . Healthy Son     Medications:   Current Outpatient Medications on File Prior to Visit  Medication Sig Dispense Refill  . acetaminophen  (TYLENOL) 500 MG tablet Take 1,000 mg by mouth every 6 (six) hours as needed for mild pain or moderate pain.    Marland Kitchen amoxicillin (AMOXIL) 500 MG capsule Take 2,000 mg by mouth once as needed (dental work).    Marland Kitchen aspirin EC 81 MG tablet Take 81 mg by mouth daily.    Marland Kitchen atenolol (TENORMIN) 50 MG tablet Take 75 mg by mouth daily.    Marland Kitchen atorvastatin (LIPITOR) 40 MG tablet Take 1 tablet (40 mg total) by mouth at bedtime. 30 tablet 2  . Biotin 5000 MCG TABS Take 5,000 mcg by mouth daily.    . cholecalciferol (VITAMIN D) 400 units TABS tablet Take 400 Units by mouth daily.    . cyclobenzaprine (FLEXERIL) 5 MG tablet Take 1 tablet (5 mg total) by mouth 3 (three) times daily. (Patient taking differently: Take 5-10 mg by mouth at bedtime as needed for muscle spasms. ) 90 tablet 1  . lisinopril (ZESTRIL) 10 MG tablet Take 10 mg by mouth daily.    . metFORMIN (GLUCOPHAGE) 500 MG tablet TAKE 1 TABLET BY MOUTH TWICE A DAY WITH MEALS 60 tablet 1  . Omega-3 Fatty Acids (FISH OIL) 1200 MG CAPS Take 1,200 mg by mouth daily.     Marland Kitchen omeprazole (PRILOSEC) 40 MG capsule Take 1 capsule (40 mg total) by mouth daily. 90 capsule 3  . Propylene Glycol (SYSTANE BALANCE) 0.6 % SOLN Place 2 drops into both eyes 2 (two) times daily.     Marland Kitchen saccharomyces boulardii (FLORASTOR) 250 MG capsule Take 250 mg by mouth 2 (two) times daily as needed (for one week following dental procedure).    . traMADol (ULTRAM) 50 MG tablet Take 50-100 mg by mouth at bedtime as needed for pain.     No current facility-administered medications on file prior to visit.    Allergies:   Allergies  Allergen Reactions  . Amlodipine     Blurred vision   . Compazine [Prochlorperazine Edisylate]     Stroke symptoms,  . Gabapentin     Blurred vision and nystagmus  . Lyrica [Pregabalin]     Blurred Vision   . Sertraline Other (See Comments)    Visual disturbances.   . Chlorhexidine Rash    Blisters and redness to chest     Physical  Exam  Vitals:    11/29/19 1408  BP: 118/62  Pulse: 82  Weight: 197 lb (89.4 kg)  Height: 5' 4" (1.626 m)   Body mass index is 33.81 kg/m. No exam data present  General: well developed, well nourished, pleasant middle-age Caucasian female, seated, in no evident distress Head: head normocephalic and atraumatic.   Neck: supple with no carotid or supraclavicular bruits Cardiovascular: regular rate and rhythm, no murmurs Musculoskeletal: no deformity Skin:  no rash/petichiae Vascular:  Normal pulses all extremities   Neurologic Exam Mental Status: Awake and fully alert. Normal speech and language. Oriented to place and time. Recent and remote memory intact. Attention span, concentration and fund of knowledge appropriate. Mood and affect appropriate.  Cranial Nerves: Fundoscopic exam reveals sharp disc margins. Pupils equal, briskly reactive to light. Extraocular movements full without nystagmus. Visual fields full to confrontation. Hearing intact. Facial sensation intact. Face, tongue, palate moves normally and symmetrically.  Motor: Normal bulk and tone. Normal strength in all tested extremity muscles. Sensory.: intact to touch , pinprick , position and vibratory sensation.  Coordination: Rapid alternating movements normal in all extremities. Finger-to-nose and heel-to-shin performed accurately bilaterally. Gait and Station: Arises from chair without difficulty. Stance is normal. Gait demonstrates normal stride length and balance Reflexes: 1+ and symmetric. Toes downgoing.      Diagnostic Data (Labs, Imaging, Testing)   Code Stroke CT headhyperdense L MCA.ASPECTS 10.   CTA head & neckL M2 MCA ELVO. L ICA 50% stenosis,mild L siphon plaque. Mild atherosclerosis throughout  CT perfusion7 mL core, 53 mL penumbra L MCA territory   Cerebral angio TICI3 revascularization distal L M2   F/u CT mild SAH L sylvian fissure. No infarct  MRIpatchy small L MCA territory infarct (L insula, L frontal  operculum, L temporal lobe). Small SAHD L sylvian fissure stable.  MRA Unremarkable   2D EchoEF 65-70%. No source of embolus   LE venous dopplerNo DVT  Loop recorder placed 1/13 to look for AF as source of stroke [facility])  [id number]  HgbA1c7.0    ASSESSMENT: Faatima Tench is a 69 y.o. year old female presented with aphasia on 07/17/2019 with stroke work-up revealing left MCA infarct due to left M2 occlusion status post TPA and IR with resultant small SAH left sylvian fissure, infarct secondary to unknown source.  Loop recorder placed which has not shown atrial fibrillation thus far.  Vascular risk factors include history of endometrial cancer with completion of chemo, HTN, HLD, DM, L ICA 50% stenosis.  Recovered well from a stroke standpoint without residual deficits.  Chronic frontal headaches worsened post stroke but have significantly improved with use of amitriptyline     PLAN:  1. Cryptogenic left MCA stroke :  -Continue aspirin 81 mg daily  and atorvastatin 40 mg daily for secondary stroke prevention.   -Continue to monitor loop recorder for possible atrial fibrillation.   -Maintain strict control of hypertension with blood pressure goal below 130/90, diabetes with hemoglobin A1c goal below 6.5% and cholesterol with LDL cholesterol (bad cholesterol) goal below 70 mg/dL.  I also advised the patient to eat a healthy diet with plenty of whole grains, cereals, fruits and vegetables, exercise regularly with at least 30 minutes of continuous activity daily and maintain ideal body weight. 2. HTN: Stable.  Continue to follow with PCP for monitoring and management 3. HLD: Continuation atorvastatin and continue to follow with PCP for prescribing, monitoring and management 4. DMII: Continue to follow with PCP for monitoring and management  5. Frontal headaches: Likely migrainous type headache worsened post stroke.  Great benefit with use of amitriptyline 50 mg daily therefore will continue.   Refill provided.  Advised to notify office with any worsening headaches or difficulty tolerating 6. Left ICA stenosis: Recommend repeating carotid ultrasound at follow-up visit for surveillance monitoring    Follow up in 4 months or call earlier if needed   I spent 25 minutes of face-to-face and non-face-to-face time with patient.  This included previsit chart review, lab review, study review, order entry, electronic health record documentation, patient education    Frann Rider, [facility]  [facility] [address] [place], [place] [address]  Phone [phone] Fax [phone] Note: This document was prepared with digital dictation and possible smart phrase technology. Any transcriptional errors that result from this process are unintentional.

## 2019-11-29 NOTE — Patient Instructions (Signed)
Continue amitriptyline 50mg  nightly for headache management  Continue aspirin 81 mg daily  and atorvastatin 40mg  daily  for secondary stroke prevention  Continue to follow up with PCP regarding cholesterol and blood pressure management   Continue to monitor blood pressure at home  Maintain strict control of hypertension with blood pressure goal below 130/90, diabetes with hemoglobin A1c goal below 6.5% and cholesterol with LDL cholesterol (bad cholesterol) goal below 70 mg/dL. I also advised the patient to eat a healthy diet with plenty of whole grains, cereals, fruits and vegetables, exercise regularly and maintain ideal body weight.  Followup in the future with me in 4 months or call earlier if needed       Thank you for coming to see [place] at [facility]. I hope we have been able to provide you high quality care today.  You may receive a patient satisfaction survey over the next few weeks. We would appreciate your feedback and comments so that we may continue to improve ourselves and the health of our patients.

## 2019-11-30 ENCOUNTER — Ambulatory Visit: Payer: Medicare Other | Admitting: Adult Health

## 2019-12-01 NOTE — Progress Notes (Signed)
I agree with the above plan 

## 2019-12-13 ENCOUNTER — Encounter: Payer: Self-pay | Admitting: Gastroenterology

## 2019-12-25 ENCOUNTER — Ambulatory Visit (INDEPENDENT_AMBULATORY_CARE_PROVIDER_SITE_OTHER): Payer: Medicare Other | Admitting: *Deleted

## 2019-12-25 DIAGNOSIS — I63412 Cerebral infarction due to embolism of left middle cerebral artery: Secondary | ICD-10-CM | POA: Diagnosis not present

## 2019-12-25 LAB — CUP PACEART REMOTE DEVICE CHECK
Date Time Interrogation Session: 20210620230442
Implantable Pulse Generator Implant Date: 20210113

## 2019-12-26 NOTE — Progress Notes (Signed)
Carelink Summary Report / Loop Recorder 

## 2020-01-02 ENCOUNTER — Encounter: Payer: Self-pay | Admitting: Gynecologic Oncology

## 2020-01-02 ENCOUNTER — Inpatient Hospital Stay: Payer: Medicare Other | Attending: Gynecologic Oncology | Admitting: Gynecologic Oncology

## 2020-01-02 ENCOUNTER — Other Ambulatory Visit: Payer: Self-pay

## 2020-01-02 VITALS — BP 127/83 | HR 73 | Temp 98.3°F | Resp 18 | Ht 64.0 in | Wt 193.6 lb

## 2020-01-02 DIAGNOSIS — Z8673 Personal history of transient ischemic attack (TIA), and cerebral infarction without residual deficits: Secondary | ICD-10-CM | POA: Insufficient documentation

## 2020-01-02 DIAGNOSIS — Z90722 Acquired absence of ovaries, bilateral: Secondary | ICD-10-CM | POA: Diagnosis not present

## 2020-01-02 DIAGNOSIS — Z7982 Long term (current) use of aspirin: Secondary | ICD-10-CM | POA: Diagnosis not present

## 2020-01-02 DIAGNOSIS — C541 Malignant neoplasm of endometrium: Secondary | ICD-10-CM | POA: Insufficient documentation

## 2020-01-02 DIAGNOSIS — I2510 Atherosclerotic heart disease of native coronary artery without angina pectoris: Secondary | ICD-10-CM | POA: Diagnosis not present

## 2020-01-02 DIAGNOSIS — Z1509 Genetic susceptibility to other malignant neoplasm: Secondary | ICD-10-CM | POA: Insufficient documentation

## 2020-01-02 DIAGNOSIS — Z90710 Acquired absence of both cervix and uterus: Secondary | ICD-10-CM | POA: Insufficient documentation

## 2020-01-02 DIAGNOSIS — Z9221 Personal history of antineoplastic chemotherapy: Secondary | ICD-10-CM | POA: Insufficient documentation

## 2020-01-02 DIAGNOSIS — I7090 Unspecified atherosclerosis: Secondary | ICD-10-CM | POA: Insufficient documentation

## 2020-01-02 NOTE — Patient Instructions (Signed)
Please notify Dr Kaide Gage at phone number [phone] if you notice vaginal bleeding, new pelvic or abdominal pains, bloating, feeling full easy, or a change in bladder or bowel function.   Please return to see Dr Nam Vossler in 3 months. 
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
## 2020-01-15 ENCOUNTER — Encounter: Payer: Self-pay | Admitting: Gastroenterology

## 2020-01-29 ENCOUNTER — Ambulatory Visit (INDEPENDENT_AMBULATORY_CARE_PROVIDER_SITE_OTHER): Payer: Medicare Other | Admitting: *Deleted

## 2020-01-29 DIAGNOSIS — I63412 Cerebral infarction due to embolism of left middle cerebral artery: Secondary | ICD-10-CM | POA: Diagnosis not present

## 2020-01-29 LAB — CUP PACEART REMOTE DEVICE CHECK
Date Time Interrogation Session: 20210726101852
Implantable Pulse Generator Implant Date: 20210113

## 2020-02-01 NOTE — Progress Notes (Signed)
Carelink Summary Report / Loop Recorder 

## 2020-02-16 ENCOUNTER — Telehealth: Payer: Self-pay | Admitting: Oncology

## 2020-02-16 NOTE — Telephone Encounter (Signed)
Jordan Martinez called and was wondering if she would be considered high risk and eligible for the Covid booster.  Advised her that we are not sure now what the criteria will be for the booster and to call back next week.  She verbalized understanding.

## 2020-02-29 ENCOUNTER — Ambulatory Visit (AMBULATORY_SURGERY_CENTER): Payer: Self-pay

## 2020-02-29 ENCOUNTER — Other Ambulatory Visit: Payer: Self-pay

## 2020-02-29 VITALS — Ht 64.0 in | Wt 198.0 lb

## 2020-02-29 DIAGNOSIS — Z86010 Personal history of colonic polyps: Secondary | ICD-10-CM

## 2020-02-29 MED ORDER — SUTAB 1479-225-188 MG PO TABS: 1.0000 | ORAL_TABLET | ORAL | 0 refills | Status: DC

## 2020-02-29 NOTE — Progress Notes (Signed)
No egg or soy allergy known to patient  No issues with past sedation with any surgeries or procedures No intubation problems in the past  No FH of Malignant Hyperthermia No diet pills per patient No home 02 use per patient  No blood thinners per patient  Pt denies issues with constipation  No A fib or A flutter -------- has loop recorder for questionable afib related to hx CVA in 07/2019=will be monitored for the next 3 years  COVID 19 guidelines implemented in PV today with Pt and RN   Coupon given to pt in PV today , Code to Pharmacy  COVID vaccines completed on 08/2019 per pt; Due to the COVID-19 pandemic we are asking patients to follow these guidelines. Please only bring one care partner. Please be aware that your care partner may wait in the car in the parking lot or if they feel like they will be too hot to wait in the car, they may wait in the lobby on the 4th floor. All care partners are required to wear a mask the entire time (we do not have any that we can provide them), they need to practice social distancing, and we will do a Covid check for all patient's and care partners when you arrive. Also we will check their temperature and your temperature. If the care partner waits in their car they need to stay in the parking lot the entire time and we will call them on their cell phone when the patient is ready for discharge so they can bring the car to the front of the building. Also all patient's will need to wear a mask into building.

## 2020-03-04 ENCOUNTER — Ambulatory Visit (INDEPENDENT_AMBULATORY_CARE_PROVIDER_SITE_OTHER): Payer: Medicare Other | Admitting: *Deleted

## 2020-03-04 DIAGNOSIS — I63412 Cerebral infarction due to embolism of left middle cerebral artery: Secondary | ICD-10-CM | POA: Diagnosis not present

## 2020-03-04 LAB — CUP PACEART REMOTE DEVICE CHECK
Date Time Interrogation Session: 20210827230542
Implantable Pulse Generator Implant Date: 20210113

## 2020-03-06 NOTE — Progress Notes (Signed)
Carelink Summary Report / Loop Recorder 

## 2020-03-14 ENCOUNTER — Other Ambulatory Visit: Payer: Self-pay

## 2020-03-14 ENCOUNTER — Encounter: Payer: Self-pay | Admitting: Gastroenterology

## 2020-03-14 ENCOUNTER — Ambulatory Visit (AMBULATORY_SURGERY_CENTER): Payer: Medicare Other | Admitting: Gastroenterology

## 2020-03-14 VITALS — BP 112/59 | HR 61 | Temp 97.7°F | Resp 11 | Ht 64.0 in | Wt 198.0 lb

## 2020-03-14 DIAGNOSIS — D124 Benign neoplasm of descending colon: Secondary | ICD-10-CM | POA: Diagnosis not present

## 2020-03-14 DIAGNOSIS — Z1509 Genetic susceptibility to other malignant neoplasm: Secondary | ICD-10-CM | POA: Diagnosis not present

## 2020-03-14 DIAGNOSIS — D120 Benign neoplasm of cecum: Secondary | ICD-10-CM

## 2020-03-14 DIAGNOSIS — D122 Benign neoplasm of ascending colon: Secondary | ICD-10-CM

## 2020-03-14 DIAGNOSIS — Z86010 Personal history of colonic polyps: Secondary | ICD-10-CM

## 2020-03-14 DIAGNOSIS — D128 Benign neoplasm of rectum: Secondary | ICD-10-CM | POA: Diagnosis not present

## 2020-03-14 MED ORDER — SODIUM CHLORIDE 0.9 % IV SOLN: 500.0000 mL | Freq: Once | INTRAVENOUS | Status: DC

## 2020-03-14 NOTE — Patient Instructions (Signed)
YOU HAD AN ENDOSCOPIC PROCEDURE TODAY AT [facility]:   Refer to the procedure report that was given to you for any specific questions about what was found during the examination.  If the procedure report does not answer your questions, please call your gastroenterologist to clarify.  If you requested that your care partner not be given the details of your procedure findings, then the procedure report has been included in a sealed envelope for you to review at your convenience later.  YOU SHOULD EXPECT: Some feelings of bloating in the abdomen. Passage of more gas than usual.  Walking can help get rid of the air that was put into your GI tract during the procedure and reduce the bloating. If you had a lower endoscopy (such as a colonoscopy or flexible sigmoidoscopy) you may notice spotting of blood in your stool or on the toilet paper. If you underwent a bowel prep for your procedure, you may not have a normal bowel movement for a few days.  Please Note:  You might notice some irritation and congestion in your nose or some drainage.  This is from the oxygen used during your procedure.  There is no need for concern and it should clear up in a day or so.  SYMPTOMS TO REPORT IMMEDIATELY:   Following lower endoscopy (colonoscopy or flexible sigmoidoscopy):  Excessive amounts of blood in the stool  Significant tenderness or worsening of abdominal pains  Swelling of the abdomen that is new, acute  Fever of 100F or higher   For urgent or emergent issues, a gastroenterologist can be reached at any hour by calling [phone]. Do not use MyChart messaging for urgent concerns.    DIET:  We do recommend a small meal at first, but then you may proceed to your regular diet.  Drink plenty of fluids but you should avoid alcoholic beverages for 24 hours. Follow a High  Fiber Diet. Drink at least 64 ounces of water daily. Add a daily stool bulking agent such as psyllium (an example would be  Metamucil).  MEDICATIONS: Continue present medications.  Please see handouts given to you by your recovery nurse.  ACTIVITY:  You should plan to take it easy for the rest of today and you should NOT DRIVE or use heavy machinery until tomorrow (because of the sedation medicines used during the test).    FOLLOW UP: Our staff will call the number listed on your records 48-72 hours following your procedure to check on you and address any questions or concerns that you may have regarding the information given to you following your procedure. If we do not reach you, we will leave a message.  We will attempt to reach you two times.  During this call, we will ask if you have developed any symptoms of COVID 19. If you develop any symptoms (ie: fever, flu-like symptoms, shortness of breath, cough etc.) before then, please call [phone].  If you test positive for Covid 19 in the 2 weeks post procedure, please call and report this information to [place].    If any biopsies were taken you will be contacted by phone or by letter within the next 1-3 weeks.  Please call us at [phone] if you have not heard about the biopsies in 3 weeks.   Thank you for allowing [place] to provide for your healthcare needs today.   SIGNATURES/CONFIDENTIALITY: You and/or your care partner have signed paperwork which will be entered into your electronic medical record.  These signatures attest to the fact that that the information above on your After Visit Summary has been reviewed and is understood.  Full responsibility of the confidentiality of this discharge information lies with you and/or your care-partner.

## 2020-03-14 NOTE — Progress Notes (Signed)
Pt's states no medical or surgical changes since previsit or office visit.  CW - vitals 

## 2020-03-14 NOTE — Op Note (Addendum)
[facility] Patient Name: Jordan Martinez Procedure Date: 03/14/2020 12:41 PM MRN: [id number] Endoscopist: Thornton Park MD, MD Age: 69 Referring MD:  Date of Birth: 29-Jun-1951 Gender: Female Account #: [id number] Procedure:                Colonoscopy Indications:              MSH6-related Lynch syndrome                           Family history of colon cancer (daughter at age 35)                           Personal history of polyps                           - hyperplastic polyps on colonoscopy in [place]                           - 4 tubular adenomas 12/23/2018 Medicines:                Monitored Anesthesia Care Procedure:                Pre-Anesthesia Assessment:                           - Prior to the procedure, a History and Physical                            was performed, and patient medications and                            allergies were reviewed. The patient's tolerance of                            previous anesthesia was also reviewed. The risks                            and benefits of the procedure and the sedation                            options and risks were discussed with the patient.                            All questions were answered, and informed consent                            was obtained. Prior Anticoagulants: The patient has                            taken no previous anticoagulant or antiplatelet                            agents. ASA Grade Assessment: III - A patient with  severe systemic disease. After reviewing the risks                            and benefits, the patient was deemed in                            satisfactory condition to undergo the procedure.                           After obtaining informed consent, the colonoscope                            was passed under direct vision. Throughout the                            procedure, the patient's blood pressure, pulse, and                             oxygen saturations were monitored continuously. The                            Colonoscope was introduced through the anus and                            advanced to the 3 cm into the ileum. The                            colonoscopy was performed without difficulty. The                            patient tolerated the procedure well. The quality                            of the bowel preparation was good. The terminal                            ileum, ileocecal valve, appendiceal orifice, and                            rectum were photographed. Scope In: 12:54:45 PM Scope Out: 1:09:07 PM Scope Withdrawal Time: 0 hours 10 minutes 8 seconds  Total Procedure Duration: 0 hours 14 minutes 22 seconds  Findings:                 The perianal and digital rectal examinations were                            normal.                           Multiple small and large-mouthed diverticula were                            found in the sigmoid colon and descending colon.  Two flat polyps were found in the rectum. The                            polyps were 2 mm in size. These polyps were removed                            with a cold snare. Resection and retrieval were                            complete. Estimated blood loss was minimal.                           Two sessile polyps were found in the descending                            colon. The polyps were 2 mm in size. These polyps                            were removed with a cold snare. Resection and                            retrieval were complete. Estimated blood loss was                            minimal.                           Two sessile polyps were found in the ascending                            colon and cecum. The polyps were 1 mm in size.                            These polyps were removed with a cold biopsy                            forceps. Resection and retrieval were complete.                             Estimated blood loss was minimal. Complications:            No immediate complications. Estimated blood loss:                            Minimal. Estimated Blood Loss:     Estimated blood loss was minimal. Impression:               - Diverticulosis in the sigmoid colon and in the                            descending colon.                           - Two 2 mm polyps in the rectum, removed  with a                            cold snare. Resected and retrieved.                           - Two 2 mm polyps in the descending colon, removed                            with a cold snare. Resected and retrieved.                           - The examination was otherwise normal on direct                            and retroflexion views. Recommendation:           - Patient has a contact number available for                            emergencies. The signs and symptoms of potential                            delayed complications were discussed with the                            patient. Return to normal activities tomorrow.                            Written discharge instructions were provided to the                            patient.                           - Follow a high fiber diet. Drink at least 64                            ounces of water daily. Add a daily stool bulking                            agent such as psyllium (an exampled would be                            Metamucil).                           - Continue present medications.                           - Await pathology results.                           - Repeat colonoscopy in 1 year for surveillance.                           -  Emerging evidence supports eating a diet of                            fruits, vegetables, grains, calcium, and yogurt                            while reducing red meat and alcohol may reduce the                            risk of colon cancer.                           - Thank you for  allowing me to be involved in your                            colon cancer prevention. Thornton Park MD, MD 03/14/2020 1:18:53 PM This report has been signed electronically.

## 2020-03-14 NOTE — Progress Notes (Signed)
PT taken to PACU. Monitors in place. VSS. Report given to RN. 

## 2020-03-18 ENCOUNTER — Telehealth: Payer: Self-pay

## 2020-03-18 NOTE — Telephone Encounter (Signed)
  Follow up Call-  Call back number 03/14/2020 12/23/2018  Post procedure Call Back phone  # [phone]  Permission to leave phone message Yes Yes  Some recent data might be hidden     Patient questions:  Do you have a fever, pain , or abdominal swelling? No. Pain Score  0 *  Have you tolerated food without any problems? Yes.    Have you been able to return to your normal activities? Yes.    Do you have any questions about your discharge instructions: Diet   No. Medications  No. Follow up visit  No.  Do you have questions or concerns about your Care? No.  Actions: * If pain score is 4 or above: 1. No action needed, pain <4.Have you developed a fever since your procedure? no  2.   Have you had an respiratory symptoms (SOB or cough) since your procedure? no  3.   Have you tested positive for COVID 19 since your procedure no  4.   Have you had any family members/close contacts diagnosed with the COVID 19 since your procedure?  no   If yes to any of these questions please route to [name], RN and Joella Prince, RN

## 2020-03-20 ENCOUNTER — Telehealth: Payer: Self-pay | Admitting: *Deleted

## 2020-03-20 NOTE — Telephone Encounter (Signed)
Patient called and rescheduled her appt from 9/29 to 9/28

## 2020-03-31 ENCOUNTER — Encounter: Payer: Self-pay | Admitting: Gastroenterology

## 2020-04-01 ENCOUNTER — Ambulatory Visit (INDEPENDENT_AMBULATORY_CARE_PROVIDER_SITE_OTHER): Payer: Medicare Other | Admitting: Adult Health

## 2020-04-01 ENCOUNTER — Telehealth: Payer: Self-pay | Admitting: Adult Health

## 2020-04-01 ENCOUNTER — Other Ambulatory Visit: Payer: Self-pay

## 2020-04-01 ENCOUNTER — Encounter: Payer: Self-pay | Admitting: Adult Health

## 2020-04-01 VITALS — BP 138/78 | HR 78 | Ht 64.0 in | Wt 198.0 lb

## 2020-04-01 DIAGNOSIS — E785 Hyperlipidemia, unspecified: Secondary | ICD-10-CM | POA: Diagnosis not present

## 2020-04-01 DIAGNOSIS — I639 Cerebral infarction, unspecified: Secondary | ICD-10-CM | POA: Diagnosis not present

## 2020-04-01 DIAGNOSIS — I10 Essential (primary) hypertension: Secondary | ICD-10-CM | POA: Diagnosis not present

## 2020-04-01 DIAGNOSIS — G43709 Chronic migraine without aura, not intractable, without status migrainosus: Secondary | ICD-10-CM | POA: Diagnosis not present

## 2020-04-01 DIAGNOSIS — E119 Type 2 diabetes mellitus without complications: Secondary | ICD-10-CM

## 2020-04-01 DIAGNOSIS — I6522 Occlusion and stenosis of left carotid artery: Secondary | ICD-10-CM

## 2020-04-01 MED ORDER — AMITRIPTYLINE HCL 25 MG PO TABS: 25.0000 mg | ORAL_TABLET | Freq: Every day | ORAL | 3 refills | Status: DC

## 2020-04-01 NOTE — Telephone Encounter (Signed)
Pt called wanting to speak to RN or Provider so that she can have the discharge instructions she received today explained to her. Please advise.

## 2020-04-01 NOTE — Progress Notes (Signed)
[facility] [address] [place]. [place] [address] [phone]       STROKE FOLLOW UP NOTE  [name] Date of Birth:  March 05, 1951 Medical Record Number:  [id number]   Reason for Referral: stroke follow up    CHIEF COMPLAINT:  Chief Complaint  Patient presents with  . Follow-up    tx rm- Pt says she has no new sx.  . Cerebrovascular Accident    HPI:  Today, 04/01/2020, patient returns for stroke f/u. Stable from stroke standpoint since prior visit without residual deficits. Denies new stroke/TIA symptoms. Chronic headaches stable on amitriptyline $RemoveBeforeDE'50mg'cRtuqFWXddHoAMz$  nightly but does report morning grogginess.  Remains on aspirin $RemoveBe'81mg'jKjUyTvZn$  daily and atorvastatin without side effects. Blood pressure today 138/78. Loop recorder has not shown atrial fibrillation thus far.  She continues to be followed by pain management for bilateral knee osteoarthritis.  Does report occasional anxiety due to medical conditions but overall stable.  No further concerns at this time.     History provided for reference purposes only Update 11/29/2019 JM: Jordan Martinez is being seen for follow-up regarding left MCA stroke in 07/2018.  She has been doing well from a stroke standpoint without new or reoccurring stroke/TIA symptoms.  Underlying chronic headaches worsened post stroke and initiated amitriptyline with eventual increase to $RemoveBef'50mg'pzPMojTuYW$  with great benefit. Reports mild side effects such as mild fatigue the following day and mild skin photosensitivity. She does endorse improvement of anxiety as well with use of amitriptyline. Continues on aspirin and atorvastatin for secondary stroke prevention.  Blood pressure today 118/62.  Loop recorder has not shown atrial fibrillation thus far.  No further concerns at this time.  Initial visit 08/23/2019 JM: Jordan Martinez is a 69 year old female who is being seen today for hospital follow up.  She is a retired Marine scientist.  Recovered well from a  stroke standpoint without residual speech deficits.  Prior history of frontal headaches worsened with eyestrain and was told due to prior chemo treatments.  Headache intensity worsened post stroke gradually returning to baseline but continues to experience frequently.  At times she also experienced cloudiness but this has been unchanged.  She will be seeing her ophthalmologist next week for 1 year follow-up.  Describes headaches as a pressure sensation in frontal region and behind eyes bilaterally associated with photophobia and phonophobia but denies nausea/vomiting.  She will occasionally experience small flicker of light in her periphery prior to headache onset and is unsure if headaches are worse when she experiences visual aura.  She has not previously been treated for headaches or been diagnosed with migraine headaches.  She also endorses increased anxiety due to fear of recurrent stroke.  She has completed 3 weeks DAPT and continues on aspirin alone without bleeding or bruising.  Continues on atorvastatin 40 mg daily without myalgias.  Blood pressure today satisfactory at 130/70.  She has not had follow-up with IR as recommended post revascularization during admission.  Loop recorder is not shown atrial fibrillation thus far.  No further concerns at this time.  Hospital admission 07/17/2019: Jordan Martinez a 69 y.o.femalewith history of diabetes, endometrial cancer, HNPCC,hyperlipidemia, hypertensionwho presented on 07/17/2019 with aphasia.  Evaluated by stroke team and  Dr. Erlinda Hong with stroke work-up revealing left MCA infarct due to left M2 occlusion s/p TPA and IR with TICI 3 revascularization of distal left M2 with resultant small SAH left sylvian fissure, infarct embolic secondary to unknown source.  2D echo unremarkable.  LE venous Doppler  negative.  Loop recorder placed to rule out A. fib as potential stroke etiology.  Recommended DAPT for 3 weeks and aspirin alone.  Initiated atorvastatin 40 mg  daily with LDL 120.  HTN stable.  Controlled DM with A1c 7.0.  Current tobacco use with smoking cessation counseling provided.  Other stroke risk factors include advanced age, EtOH use and obesity but no prior history of stroke.  Discharged back to [facility] with recommendation of physical therapy.     ROS:   14 system review of systems performed and negative with exception of headaches, anxiety  PMH:  Past Medical History:  Diagnosis Date  . Anxiety    on meds  . Arthritis    on PRN meds  . Cataract    bilateral cat. ext. with lens implant- in [place]  . Diabetes mellitus without complication (HCC)    diet controlled. States always has borderline HgbA1C  . Endometrial cancer ([place])    endometrial CA-2019  . Family history of colon cancer    daughter 45 colon CA  . Family history of genetic mutation for hereditary nonpolyposis colorectal cancer (HNPCC)   . GERD (gastroesophageal reflux disease)    on meds  . High cholesterol    on meds  . History of kidney stones    years   . Hypertension    on meds  . Neuropathy   . Sensation of pressure in bladder area   . Stroke ([place]) 07/17/2019  . Uterine cramping     PSH:  Past Surgical History:  Procedure Laterality Date  . burnt the nerves  Right 04/29/2018   in right knee   . burnt the nerves  Left 04/22/2018   left knee nerve   . COLONOSCOPY  12/2018   [place]- 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  . cystoscopy kidney stone   1985  . HYSTERECTOMY ABDOMINAL WITH SALPINGO-OOPHORECTOMY  2019  . IR CT HEAD LTD  07/18/2019  . IR IMAGING GUIDED PORT INSERTION  06/15/2018  . IR PERCUTANEOUS ART THROMBECTOMY/INFUSION INTRACRANIAL INC DIAG ANGIO  07/18/2019  . IR REMOVAL TUN ACCESS W/ PORT W/O FL MOD SED  12/01/2018  . LOOP RECORDER INSERTION N/A 07/19/2019   Procedure: LOOP RECORDER INSERTION;  Surgeon: Constance Haw, MD;  Location: [facility];  Service: Cardiovascular;  Laterality: N/A;  . RADIOLOGY WITH ANESTHESIA N/A  07/17/2019   Procedure: IR WITH ANESTHESIA;  Surgeon: Luanne Bras, MD;  Location: [facility];  Service: Radiology;  Laterality: N/A;  . REPLACEMENT TOTAL KNEE BILATERAL Bilateral 2018   Right x 2. 2019 undergoing intermittent injection "RFA" procedures with Dr. Sheliah Mends [facility]  . REVISION TOTAL KNEE ARTHROPLASTY Right 03/2017  . ROBOTIC ASSISTED TOTAL HYSTERECTOMY WITH BILATERAL SALPINGO OOPHERECTOMY N/A 05/03/2018   Procedure: XI ROBOTIC ASSISTED TOTAL HYSTERECTOMY WITH BILATERAL SALPINGO OOPHORECTOMY;  Surgeon: Everitt Amber, MD;  Location: [facility];  Service: Gynecology;  Laterality: N/A;  . SENTINEL NODE BIOPSY N/A 05/03/2018   Procedure: SENTINEL NODE BIOPSY;  Surgeon: Everitt Amber, MD;  Location: [facility];  Service: Gynecology;  Laterality: N/A;  . TONSILLECTOMY AND ADENOIDECTOMY    . TUBAL LIGATION      Social History:  Social History   Socioeconomic History  . Marital status: Divorced    Spouse name: Not on file  . Number of children: 2  . Years of education: Not on file  . Highest education level: Not on file  Occupational History  . Occupation: retired Marine scientist  Tobacco Use  .  Smoking status: Current Every Day Smoker    Packs/day: 0.25    Years: 40.00    Pack years: 10.00    Types: Cigarettes  . Smokeless tobacco: Never Used  . Tobacco comment: 4-5 loose cigarettes per day , over 20 year hx of smoking   Vaping Use  . Vaping Use: Never used  Substance and Sexual Activity  . Alcohol use: Yes    Alcohol/week: 14.0 standard drinks    Types: 14 Glasses of wine per week    Comment: about 2 glasses wine daily.   . Drug use: Never  . Sexual activity: Not Currently    Birth control/protection: None  Other Topics Concern  . Not on file  Social History Narrative  . Not on file   Social Determinants of Health   Financial Resource Strain:   . Difficulty of Paying Living Expenses: Not on file  Food Insecurity:   . Worried About Charity fundraiser in the Last Year: Not  on file  . Ran Out of Food in the Last Year: Not on file  Transportation Needs:   . Lack of Transportation (Medical): Not on file  . Lack of Transportation (Non-Medical): Not on file  Physical Activity:   . Days of Exercise per Week: Not on file  . Minutes of Exercise per Session: Not on file  Stress:   . Feeling of Stress : Not on file  Social Connections:   . Frequency of Communication with Friends and Family: Not on file  . Frequency of Social Gatherings with Friends and Family: Not on file  . Attends Religious Services: Not on file  . Active Member of Clubs or Organizations: Not on file  . Attends Archivist Meetings: Not on file  . Marital Status: Not on file  Intimate Partner Violence:   . Fear of Current or Ex-Partner: Not on file  . Emotionally Abused: Not on file  . Physically Abused: Not on file  . Sexually Abused: Not on file    Family History:  Family History  Adopted: Yes  Problem Relation Age of Onset  . Colon cancer Daughter 28       Lynch syndrome - MSH6 +  . Colon polyps Daughter 77  . Healthy Son     Medications:   Current Outpatient Medications on File Prior to Visit  Medication Sig Dispense Refill  . acetaminophen (TYLENOL) 500 MG tablet Take 1,000 mg by mouth every 6 (six) hours as needed for mild pain or moderate pain.    Marland Kitchen amoxicillin (AMOXIL) 500 MG capsule Take 2,000 mg by mouth once as needed (dental work).     Marland Kitchen aspirin EC 81 MG tablet Take 81 mg by mouth daily.    Marland Kitchen atenolol (TENORMIN) 50 MG tablet Take 75 mg by mouth daily.    Marland Kitchen atorvastatin (LIPITOR) 40 MG tablet Take 1 tablet (40 mg total) by mouth at bedtime. 30 tablet 2  . Biotin 5000 MCG TABS Take 5,000 mcg by mouth daily.    . cholecalciferol (VITAMIN D) 400 units TABS tablet Take 400 Units by mouth daily.    . cyclobenzaprine (FLEXERIL) 5 MG tablet Take 1 tablet (5 mg total) by mouth 3 (three) times daily. (Patient taking differently: Take 5-10 mg by mouth at bedtime as needed  for muscle spasms. ) 90 tablet 1  . Lancets (ONETOUCH DELICA PLUS [id number]) MISC Apply topically 2 (two) times daily.    Marland Kitchen lisinopril (ZESTRIL) 10 MG tablet Take 10  mg by mouth daily.    . metFORMIN (GLUCOPHAGE) 500 MG tablet TAKE 1 TABLET BY MOUTH TWICE A DAY WITH MEALS 60 tablet 1  . Omega-3 Fatty Acids (FISH OIL) 1200 MG CAPS Take 1,200 mg by mouth daily.     Marland Kitchen omeprazole (PRILOSEC) 40 MG capsule Take 1 capsule (40 mg total) by mouth daily. 90 capsule 3  . Propylene Glycol (SYSTANE BALANCE) 0.6 % SOLN Place 2 drops into both eyes 2 (two) times daily.     Marland Kitchen saccharomyces boulardii (FLORASTOR) 250 MG capsule Take 250 mg by mouth 2 (two) times daily as needed (for one week following dental procedure).     . traMADol (ULTRAM) 50 MG tablet Take 50-100 mg by mouth at bedtime as needed for pain.     Current Facility-Administered Medications on File Prior to Visit  Medication Dose Route Frequency Provider Last Rate Last Admin  . 0.9 %  sodium chloride infusion  500 mL Intravenous Once [place], MD        Allergies:   Allergies  Allergen Reactions  . Amlodipine     Blurred vision   . Compazine [Prochlorperazine Edisylate]     Stroke symptoms,  . Gabapentin     Blurred vision and nystagmus  . Lyrica [Pregabalin]     Blurred Vision   . Sertraline Other (See Comments)    Visual disturbances.   . Chlorhexidine Rash    Blisters and redness to chest     Physical Exam  Vitals:   04/01/20 1237  BP: 138/78  Pulse: 78  Weight: 198 lb (89.8 kg)  Height: $Remove'5\' 4"'ydCoJfY$  (1.626 m)   Body mass index is 33.99 kg/m. No exam data present  General: well developed, well nourished, pleasant middle-age Caucasian female, seated, in no evident distress Head: head normocephalic and atraumatic.   Neck: supple with no carotid or supraclavicular bruits Cardiovascular: regular rate and rhythm, no murmurs Musculoskeletal: no deformity Skin:  no rash/petichiae Vascular:  Normal pulses all  extremities   Neurologic Exam Mental Status: Awake and fully alert.   Fluent speech and language. Oriented to place and time. Recent and remote memory intact. Attention span, concentration and fund of knowledge appropriate. Mood and affect appropriate.  Cranial Nerves: Pupils equal, briskly reactive to light. Extraocular movements full without nystagmus. Visual fields full to confrontation. Hearing intact. Facial sensation intact. Face, tongue, palate moves normally and symmetrically.  Motor: Normal bulk and tone. Normal strength in all tested extremity muscles. Sensory.: intact to touch , pinprick , position and vibratory sensation.  Coordination: Rapid alternating movements normal in all extremities. Finger-to-nose and heel-to-shin performed accurately bilaterally. Gait and Station: Arises from chair without difficulty. Stance is normal. Gait demonstrates normal stride length and balance Reflexes: 1+ and symmetric. Toes downgoing.        ASSESSMENT: Jordan Martinez is a 69 y.o. year old female presented with aphasia on 07/17/2019 with stroke work-up revealing left MCA infarct due to left M2 occlusion status post TPA and IR with resultant small SAH left sylvian fissure, infarct secondary to unknown source. Loop recorder placed.  Vascular risk factors include history of endometrial cancer with completion of chemo 2019, HTN, HLD, DM, L ICA 50% stenosis.       PLAN:  1. Cryptogenic left MCA stroke :  a. recovered well without residual deficits b. Continue aspirin 81 mg daily  and atorvastatin 40 mg daily for secondary stroke prevention.   c. Loop recorder has not shown atrial fibrillation thus far.  Will continued to be monitored.  d. Discussed importance of close PCP follow up for aggressive stroke risk factor management 2. HTN: BP goal 130/90. Stable. Managed by PCP 3. HLD: LDL goal<70. Continue atorvastatin 40mg  daily. Managed and prescribed by PCP 4. DMII: A1c goal<7.0. managed by  PCP 5. Chronic migraines post chemo: Worsened post stroke.  Improved with initiating amitriptyline but concern for possible side effect with morning grogginess.  Recommend trialing decreasing dosage to 25 mg nightly (currently 50 mg) for possible improvement.  Advised to call office with any worsening of headaches/migraines and may consider increasing dose by 10 mg nightly to improve tolerance or possibly change medication management.  6. Left ICA stenosis: Asymptomatic.  Obtain carotid ultrasound for surveillance monitoring    Follow up in 4 months or call earlier if needed   I spent 30 minutes of face-to-face and non-face-to-face time with patient.  This included previsit chart review, lab review, study review, order entry, electronic health record documentation, patient education and discussion regarding history of cryptogenic stroke, review of loop recorder, importance of managing stroke risk factors, chronic migraines and ongoing management, carotid stenosis and answered all questions to patient satisfaction    Frann Rider, [facility]  [facility] [address] [place], [place] [address]  Phone [phone] Fax [phone] Note: This document was prepared with digital dictation and possible smart phrase technology. Any transcriptional errors that result from this process are unintentional.

## 2020-04-01 NOTE — Patient Instructions (Addendum)
Continue aspirin 81 mg daily  and atorvastatin for secondary stroke prevention  Recommend decreasing amitriptyline to 25 mg nightly due to morning grogginess.  If you experience any worsening of headaches, please call office and we may consider slightly increasing dosage or switching to nortriptyline.   Loop recorder will continue to be monitored for atrial fibrillation  Obtain carotid ultrasound for surveillance monitoring of left ICA stenosis  Continue to follow up with PCP regarding cholesterol and blood pressure management  Maintain strict control of hypertension with blood pressure goal below 130/90 and cholesterol with LDL cholesterol (bad cholesterol) goal below 70 mg/dL.       Followup in the future with me in 6 months or call earlier if needed       Thank you for coming to see [place] at [facility]. I hope we have been able to provide you high quality care today.  You may receive a patient satisfaction survey over the next few weeks. We would appreciate your feedback and comments so that we may continue to improve ourselves and the health of our patients.

## 2020-04-02 ENCOUNTER — Inpatient Hospital Stay: Payer: Medicare Other | Attending: Gynecologic Oncology | Admitting: Gynecologic Oncology

## 2020-04-02 ENCOUNTER — Other Ambulatory Visit: Payer: Self-pay

## 2020-04-02 ENCOUNTER — Encounter: Payer: Self-pay | Admitting: Gynecologic Oncology

## 2020-04-02 VITALS — BP 132/53 | HR 75 | Temp 97.2°F | Resp 20 | Wt 195.3 lb

## 2020-04-02 DIAGNOSIS — Z90722 Acquired absence of ovaries, bilateral: Secondary | ICD-10-CM | POA: Diagnosis not present

## 2020-04-02 DIAGNOSIS — Z79899 Other long term (current) drug therapy: Secondary | ICD-10-CM | POA: Insufficient documentation

## 2020-04-02 DIAGNOSIS — E1136 Type 2 diabetes mellitus with diabetic cataract: Secondary | ICD-10-CM | POA: Diagnosis not present

## 2020-04-02 DIAGNOSIS — C541 Malignant neoplasm of endometrium: Secondary | ICD-10-CM | POA: Insufficient documentation

## 2020-04-02 DIAGNOSIS — I2510 Atherosclerotic heart disease of native coronary artery without angina pectoris: Secondary | ICD-10-CM | POA: Insufficient documentation

## 2020-04-02 DIAGNOSIS — N952 Postmenopausal atrophic vaginitis: Secondary | ICD-10-CM | POA: Insufficient documentation

## 2020-04-02 DIAGNOSIS — Z7982 Long term (current) use of aspirin: Secondary | ICD-10-CM | POA: Diagnosis not present

## 2020-04-02 DIAGNOSIS — Z7984 Long term (current) use of oral hypoglycemic drugs: Secondary | ICD-10-CM | POA: Insufficient documentation

## 2020-04-02 DIAGNOSIS — I10 Essential (primary) hypertension: Secondary | ICD-10-CM | POA: Insufficient documentation

## 2020-04-02 DIAGNOSIS — Z1509 Genetic susceptibility to other malignant neoplasm: Secondary | ICD-10-CM | POA: Insufficient documentation

## 2020-04-02 DIAGNOSIS — E7800 Pure hypercholesterolemia, unspecified: Secondary | ICD-10-CM | POA: Insufficient documentation

## 2020-04-02 DIAGNOSIS — E1140 Type 2 diabetes mellitus with diabetic neuropathy, unspecified: Secondary | ICD-10-CM | POA: Diagnosis not present

## 2020-04-02 DIAGNOSIS — Z90710 Acquired absence of both cervix and uterus: Secondary | ICD-10-CM | POA: Insufficient documentation

## 2020-04-02 DIAGNOSIS — K219 Gastro-esophageal reflux disease without esophagitis: Secondary | ICD-10-CM | POA: Diagnosis not present

## 2020-04-02 DIAGNOSIS — Z9221 Personal history of antineoplastic chemotherapy: Secondary | ICD-10-CM | POA: Diagnosis not present

## 2020-04-02 DIAGNOSIS — N949 Unspecified condition associated with female genital organs and menstrual cycle: Secondary | ICD-10-CM | POA: Insufficient documentation

## 2020-04-02 NOTE — Progress Notes (Signed)
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 MSI high, MMR abnormal (loss of Bulloch 6). Lynch syndrome  S/p 6 cycles platinum and taxane chemotherapy completed April 22nd, 2020.  Complete clinical response, no evidence of disease on exam.   Post-treatment imaging revealed nodule at vaginal cuff (73m). No concerning findings on examination and stable/benign appearing on follow-up imaging.   Vulvovaginal atrophy and vaginal dryness.   Plan  I am recommending 377-monthurveillance physical examinations until April 2022 at which time we will transition to 6 monthly evaluations.  She does not appear to have metastatic disease on most recent pet imaging, therefore additional routine screening imaging is not indicated at this time but will be ordered based on development of new symptoms or physical exam findings.  I introduced the notion of vaginal estrogen cream for her genital symptoms and discussed the safety of this preparation with early stage endometrial cancer (and no increased risk of VTE events or breast cancer). She will continue to use Replens and let me know if this no longer helps.    Chief Complaint  Patient presents with  . Endometrial cancer    follow up    GYN Oncologic Summary  HPI: Ms. Jordan Ullmanis a very nice moderately anxious 69.o.  P2 who was seen as a consult from Dr HaIhor Dow  She noted 3 weeks of postmenopausal bleeding.  On 03/30/2018 she reports this was quite heavy with cramps.  [place] and she was unable to find a provider in a timely manner.  She [facility] by the [facility] [place] [place] [facility] her to see Dr. HaIhor Dow Given the patient's symptoms an endometrial biopsy was performed along with a pelvic  ultrasound.  Documented exam by Dr. HaIhor Dowtates that there was a lesion at 6:00 on the cervix which was biopsied.  In addition to the endometrial biopsy being performed.  The endometrial biopsy revealed high-grade carcinoma with a comment stating the majority appears to be FIGO grade 3 endometrioid with scattered foci suggestive of serous phenotype.  The cervix revealed unremarkable endocervical glandular and squamous mucosa there were detached fragments of high-grade carcinoma similar to the endometrial biopsy tissue.  Ultrasound revealed a uterus 6.6 x 3 x 3.7 cm with no fibroids endometrium 1.6 cm right ovary normal left ovary not visualized.  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  A CT scan was performed preop and noted below.  In summary no evidence of extrauterine disease.  She admits to anxiety and has some pressured speech today.  She does note some vague pain and again the primary complaint of the vaginal bleeding.  Given the diagnosis of uterine cancer she was referred for management and recommendations.  The patient underwent surgical staging with a robotic assisted total hysterectomy BSO sentinel lymph node biopsy on May 03, 2018.  Due to her symptoms of abdominal bloating, elevated Ca1 25, and her daughter's history of Lynch syndrome her tinea washings were obtained.  Intraoperative findings were otherwise unremarkable and she had no gross extrauterine disease within normal omentum diaphragms and  peritoneal surfaces, 6 cm normal-appearing uterus, normal tubes and ovaries, no suspicious nodes.  The pathology revealed positive malignant cells consistent with metastatic adenocarcinoma in the peritoneal washings.  The histopathology report revealed a FIGO grade 1 endometrioid endometrial adenocarcinoma measuring 2.2 cm and invading less  than one half of the myometrium (3 of 12 mm), with no LVSI, and 5- sentinel lymph nodes, with normal cervix and adnexa.  Given the finding of positive washings, the fallopian tubes and ovaries were subsequently submitted for complete serial sectioning and no occult ovarian or fallopian tube carcinoma was identified.  MSI testing showed high instability, and mismatch repair protein IHC was abnormal with loss of nuclear expression of MSH 6.  She was subsequently seen by the genetics counselors on 05/18/2018 who drew blood for genetic screening including Lynch syndrome which was positive (mutation in MSH6).  Given the presence of positive washings and the preoperative biopsy of high grade cancer, she was felt to represent high risk for recurrence, and therefore adjuvant chemotherapy with carboplatin and paclitaxel was recommended.   She received chemotherapy between June 20, 2018 and October 26, 2018.  Chemotherapy was somewhat poorly tolerated by the patient who had severe bone pain, and issues with diabetes and dehydration.  She received 2 cycles of carboplatin with paclitaxel, a third cycle with Taxol at 50%, and then cycle 4-6 without Taxol carboplatin as a single agent.  Post treatment imaging on Nov 23, 2018 revealed an 8 mm hyperattenuating nodule along the midline vaginal cuff this is well-circumscribed.  It appeared related to calcification.  Otherwise no evidence of metastatic disease in the abdomen and pelvis.  A follow-up 69/CT was ordered and performed on 02/23/2021 reassess the area at the vaginal cuff that had previously shown some nodularity and avidity.  On this follow-up 69 scan there was no metabolic evidence of recurrent metastatic disease.  There is a stable benign postsurgical change at the hysterectomy margin that had calcified.  No other new disease was visualized.  The 69 does show evidence of coronary artery disease and atherosclerosis.   She had a CVA (left) in January, 2021  and had complete clinical response after TPA and thrombectomy. She was started on ASA 81 mg.   Interval Hx:   She has no symptoms concerning for recurrence.   She reported symptoms of genital atrophy that are very problematic and she is treating this with Replens.   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 Endometrium, biopsy - HIGH GRADE CARCINOMA, SEE COMMENT. 2. Cervix, biopsy - UNREMARKABLE ENDOCERVICAL GLANDULAR AND SQUAMOUS MUCOSA. - MULTIPLE DETACHED FRAGMENTS OF HIGH GRADE CARCINOMA, MORPHOLOGICALLY SIMILAR TO SPECIMEN 1. Microscopic Comment 1. 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   04/21/2018 Imaging   US pelvis Abnormally thickened endometrium measuring up to 16 mm. In the setting of post-menopausal bleeding, endometrial sampling is indicated to exclude carcinoma. If results are benign, sonohysterogram should be considered for focal lesion work-up.    04/27/2018 Imaging   Ct abdomen and pelvis 1. Thickening of the endometrium identified compatible with endometrial carcinoma. 2. No findings to suggest metastatic adenopathy or distant metastatic disease. 3. Hiatal hernia 4.  Aortic Atherosclerosis (ICD10-I70.0).    05/02/2018 Tumor Marker   Patient's  tumor was tested for the following markers: CA-125 Results of the tumor marker test revealed 65.3   05/03/2018 Pathology Results   1. Lymph node, sentinel, biopsy, right obturator - ONE OF ONE LYMPH NODES NEGATIVE FOR CARCINOMA (0/1). 2. Lymph node, sentinel, biopsy, left external iliac - FOUR OF FOUR LYMPH NODES NEGATIVE FOR CARCINOMA (0/4). 3. Uterus +/- tubes/ovaries, neoplastic, cervix, bilateral tubes and  ovaries - UTERUS: -ENDO/MYOMETRIUM: INVASIVE ENDOMETRIOID ADENOCARCINOMA WITH SQUAMOUS DIFFERENTIATION, FIGO GRADE 1, SPANNING 2.2 CM. TUMOR INVADES LESS THAN ONE HALF OF THE MYOMETRIUM. SEE ONCOLOGY TABLE. -SEROSA: UNREMARKABLE. NO MALIGNANCY. - CERVIX: BENIGN SQUAMOUS AND ENDOCERVICAL MUCOSA. NO DYSPLASIA OR MALIGNANCY. - BILATERAL OVARIES: INCLUSION CYSTS. NO MALIGNANCY. - BILATERAL FALLOPIAN TUBES: UNREMARKABLE. NO MALIGNANCY. Microscopic Comment 3. UTERUS, CARCINOMA OR CARCINOSARCOMA Procedure: Total hysterectomy with bilateral salpingo-oophorectomy. Right obturator and left external iliac lymph node biopsies. Histologic type: Endometrioid adenocarcinoma with squamous differentiation. Histologic Grade: FIGO grade I. Myometrial invasion: Depth of invasion: 3 mm Myometrial thickness: 12 mm Uterine Serosa Involvement: Not identified. Cervical stromal involvement: Not identified. Extent of involvement of other organs: Uninvolved. Lymphovascular invasion: Not identified. Regional Lymph Nodes: Examined: 5 Sentinel 0 Non-sentinel 5 Total Lymph nodes with metastasis: 0 Isolated tumor cells (< 0.2 mm): 0 Micrometastasis: (> 0.2 mm and < 2.0 mm): 0 Macrometastasis: (> 2.0 mm): 0 Extracapsular extension: N/A. MMR / MSI testing: Will be ordered. Pathologic Stage Classification (pTNM, AJCC 8th edition): pT1a, pN0 FIGO Stage: IA Representative Tumor Block: 3D-G Comment: Pancytokeratin was performed on the lymph nodes and is negative   05/03/2018 Surgery   Operation: Robotic-assisted laparoscopic total hysterectomy with bilateral salpingoophorectomy, SLN biopsy   Surgeon: Donaciano Eva  Operative Findings:  : 6cm normal appearing uterus. Normal tubes and ovaries. Normal omentum and diaphragm. No suspicious nodes.     05/03/2018 Pathology Results   PERITONEAL WASHING(SPECIMEN 1 OF 1 COLLECTED 05/03/18): MALIGNANT CELLS CONSISTENT WITH METASTATIC ADENOCARCINOMA.    05/18/2018 Tumor Marker   Patient's tumor was tested for the following markers: CA-125 Results of the tumor marker test revealed 77.5   05/27/2018 Genetic Testing   MSH6 c.2731C>T pathogenic variant and GATA2 c.1232C>T VUS identified on the multicancer panel.  The Multi-Gene Panel offered by [facility] includes sequencing and/or deletion duplication testing of the following 85 genes: AIP, ALK, APC, ATM, AXIN2,BAP1,  BARD1, BLM, BMPR1A, BRCA1, BRCA2, BRIP1, CASR, CDC73, CDH1, CDK4, CDKN1B, CDKN1C, CDKN2A (p14ARF), CDKN2A (p16INK4a), CEBPA, CHEK2, CTNNA1, DICER1, DIS3L2, EGFR (c.2369C>T, p.Thr790Met variant only), EPCAM (Deletion/duplication testing only), FH, FLCN, GATA2, GPC3, GREM1 (Promoter region deletion/duplication testing only), HOXB13 (c.251G>A, p.Gly84Glu), HRAS, KIT, MAX, MEN1, MET, MITF (c.952G>A, p.Glu318Lys variant only), MLH1, MSH2, MSH3, MSH6, MUTYH, NBN, NF1, NF2, NTHL1, PALB2, PDGFRA, PHOX2B, PMS2, POLD1, POLE, POT1, PRKAR1A, PTCH1, PTEN, RAD50, RAD51C, RAD51D, RB1, RECQL4, RET, RNF43, RUNX1, SDHAF2, SDHA (sequence changes only), SDHB, SDHC, SDHD, SMAD4, SMARCA4, SMARCB1, SMARCE1, STK11, SUFU, TERC, TERT, TMEM127, TP53, TSC1, TSC2, VHL, WRN and WT1.  The report date is 05/27/2018.   06/09/2018 Cancer Staging   Staging form: Corpus Uteri - Carcinoma and Carcinosarcoma, AJCC 8th Edition - Pathologic: Stage I (pT1, pN0, cM0) - Signed by Heath Lark, MD on 06/09/2018   06/15/2018 Procedure   Placement of single lumen port a cath via right internal jugular vein. The catheter tip lies at the cavo-atrial junction. A power injectable port a cath was placed and is ready for immediate use.    06/20/2018 - 10/26/2018 Chemotherapy   The patient had carboplatin and Taxol x 2 cycles, Cycle 3  Taxol at 50%, then cycle 4-6 without Taxol, carboplatin only   06/25/2018 - 06/27/2018 Hospital Admission   She was admitted to the hospital with deydration   11/23/2018 Imaging   1. 8 mm hyperattenuating  nodule along the midline vaginal cuff. Coronal imaging suggests that the hyperattenuation is related to calcification, but enhancement in this nodule cannot be excluded. Patient is status post hysterectomy in the interval since prior CT. While this finding may be related to prior surgery, close follow-up recommended. 2. Otherwise no evidence for metastatic disease in the abdomen or pelvis. 3. Large hiatal hernia with more than 50% of the stomach herniated into the lower chest, stable. 4.  Aortic Atherosclerois (ICD10-170.0)   12/01/2018 Procedure   Successful removal of implanted Port-A-Cath     Measurement of disease: Pending further workup . CA125 TBD  Radiology: CUP PACEART REMOTE DEVICE CHECK  Result Date: 03/04/2020 [facility] summary report received. Battery status OK. Normal device function. No new symptom episodes, tachy episodes, brady, or pause episodes. No new AF episodes. Monthly summary reports and ROV/PRN Kathy Breach, RN, CCDS, [employer]  CUP PACEART REMOTE DEVICE CHECK  Result Date: 01/29/2020 [facility]link summary report received. Battery status OK. Normal device function. No new symptom episodes, tachy episodes, brady, or pause episodes. No new AF episodes. Monthly summary reports and ROV/PRN  .   Outpatient Encounter Medications as of 04/02/2020  Medication Sig  . amitriptyline (ELAVIL) 25 MG tablet Take 1 tablet (25 mg total) by mouth at bedtime.  Marland Kitchen aspirin EC 81 MG tablet Take 81 mg by mouth daily.  Marland Kitchen atenolol (TENORMIN) 50 MG tablet Take 75 mg by mouth daily.  Marland Kitchen atorvastatin (LIPITOR) 40 MG tablet Take 1 tablet (40 mg total) by mouth at bedtime.  . Biotin 5000 MCG TABS Take 5,000 mcg by mouth daily.  . cholecalciferol (VITAMIN D) 400 units TABS tablet Take 400 Units by mouth daily.  . cyclobenzaprine (FLEXERIL) 5 MG tablet Take 1 tablet (5 mg total) by mouth 3 (three) times daily. (Patient taking differently: Take 5-10 mg by mouth at bedtime as needed for  muscle spasms. )  . Lancets (ONETOUCH DELICA PLUS [id number]) MISC Apply topically 2 (two) times daily.  Marland Kitchen lisinopril (ZESTRIL) 10 MG tablet Take 10 mg by mouth daily.  . metFORMIN (GLUCOPHAGE) 500 MG tablet TAKE 1 TABLET BY MOUTH TWICE A DAY WITH MEALS  . Omega-3 Fatty Acids (FISH OIL) 1200 MG CAPS Take 1,200 mg by mouth daily.   Marland Kitchen omeprazole (PRILOSEC) 40 MG capsule Take 1 capsule (40 mg total) by mouth daily.  Marland Kitchen Propylene Glycol (SYSTANE BALANCE) 0.6 % SOLN Place 2 drops into both eyes 2 (two) times daily.   Marland Kitchen saccharomyces boulardii (FLORASTOR) 250 MG capsule Take 250 mg by mouth 2 (two) times daily as needed (for one week following dental procedure).   . traMADol (ULTRAM) 50 MG tablet Take 50-100 mg by mouth at bedtime as needed for pain.  Marland Kitchen acetaminophen (TYLENOL) 500 MG tablet Take 1,000 mg by mouth every 6 (six) hours as needed for mild pain or moderate pain.  Marland Kitchen amoxicillin (AMOXIL) 500 MG capsule Take 2,000 mg by mouth once as needed (dental work).  (Patient not taking: Reported on 04/02/2020)  . [DISCONTINUED] amitriptyline (ELAVIL) 50 MG tablet Take 1 tablet (50 mg total) by mouth at bedtime.   Facility-Administered Encounter Medications as of 04/02/2020  Medication  . 0.9 %  sodium chloride infusion   Allergies  Allergen Reactions  . Amlodipine  Blurred vision   . Compazine [Prochlorperazine Edisylate]     Stroke symptoms,  . Gabapentin     Blurred vision and nystagmus  . Lyrica [Pregabalin]     Blurred Vision   . Sertraline Other (See Comments)    Visual disturbances.   . Chlorhexidine Rash    Blisters and redness to chest    Past Medical History:  Diagnosis Date  . Anxiety    on meds  . Arthritis    on PRN meds  . Cataract    bilateral cat. ext. with lens implant- in [place]  . Diabetes mellitus without complication (HCC)    diet controlled. States always has borderline HgbA1C  . Endometrial cancer ([place])    endometrial CA-2019  . Family history of colon cancer     daughter 66 colon CA  . Family history of genetic mutation for hereditary nonpolyposis colorectal cancer (HNPCC)   . GERD (gastroesophageal reflux disease)    on meds  . High cholesterol    on meds  . History of kidney stones    years   . Hypertension    on meds  . Neuropathy   . Sensation of pressure in bladder area   . Stroke ([place]) 07/17/2019  . Uterine cramping    Past Surgical History:  Procedure Laterality Date  . burnt the nerves  Right 04/29/2018   in right knee   . burnt the nerves  Left 04/22/2018   left knee nerve   . COLONOSCOPY  12/2018   [place]- 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  . cystoscopy kidney stone   1985  . HYSTERECTOMY ABDOMINAL WITH SALPINGO-OOPHORECTOMY  2019  . IR CT HEAD LTD  07/18/2019  . IR IMAGING GUIDED PORT INSERTION  06/15/2018  . IR PERCUTANEOUS ART THROMBECTOMY/INFUSION INTRACRANIAL INC DIAG ANGIO  07/18/2019  . IR REMOVAL TUN ACCESS W/ PORT W/O FL MOD SED  12/01/2018  . LOOP RECORDER INSERTION N/A 07/19/2019   Procedure: LOOP RECORDER INSERTION;  Surgeon: Constance Haw, MD;  Location: [place] [facility];  Service: Cardiovascular;  Laterality: N/A;  . RADIOLOGY WITH ANESTHESIA N/A 07/17/2019   Procedure: IR WITH ANESTHESIA;  Surgeon: Luanne Bras, MD;  Location: [place];  Service: Radiology;  Laterality: N/A;  . REPLACEMENT TOTAL KNEE BILATERAL Bilateral 2018   Right x 2. 2019 undergoing intermittent injection "RFA" procedures with Dr. Sheliah Mends [facility]  . REVISION TOTAL KNEE ARTHROPLASTY Right 03/2017  . ROBOTIC ASSISTED TOTAL HYSTERECTOMY WITH BILATERAL SALPINGO OOPHERECTOMY N/A 05/03/2018   Procedure: XI ROBOTIC ASSISTED TOTAL HYSTERECTOMY WITH BILATERAL SALPINGO OOPHORECTOMY;  Surgeon: Everitt Amber, MD;  Location: [facility];  Service: Gynecology;  Laterality: N/A;  . SENTINEL NODE BIOPSY N/A 05/03/2018   Procedure: SENTINEL NODE BIOPSY;  Surgeon: Everitt Amber, MD;  Location: [facility];  Service: Gynecology;  Laterality: N/A;  .  TONSILLECTOMY AND ADENOIDECTOMY    . TUBAL LIGATION          Past Gynecological History:   GYNECOLOGIC HISTORY:  . No LMP recorded. Patient has had a hysterectomy. 80 . Menarche: 69 years old . P 2 . Contraceptive desogen OCP and diaphragm history . HRT none  . Last Pap March 2019 "states normal" Family Hx:  Family History  Adopted: Yes  Problem Relation Age of Onset  . Colon cancer Daughter 80       Lynch syndrome - MSH6 +  . Colon polyps Daughter 34  . Healthy Son    Social Hx:  Marland Kitchen Tobacco  use: current every day . Alcohol use: 2 glasses wine daily, negative CAGE . Illicit Drug use: none . Illicit IV Drug use: none    Review of Systems: Review of Systems  Constitutional: Negative.   HENT:  Negative.   Eyes: Negative.   Respiratory: Negative.   Cardiovascular: Negative.   Gastrointestinal: Negative.   Endocrine: Negative.   Genitourinary: Negative.    Musculoskeletal: Positive for arthralgias.  Neurological: Negative.   Hematological: Negative.   Psychiatric/Behavioral: The patient is nervous/anxious.   All other systems reviewed and are negative. +weight gain  Vitals:  Vitals:   04/02/20 1452  BP: (!) 132/53  Pulse: 75  Resp: 20  Temp: (!) 97.2 F (36.2 C)  SpO2: 98%   Vitals:   04/02/20 1452  Weight: 195 lb 4.8 oz (88.6 kg)   Body mass index is 33.52 kg/m.  Physical Exam: General :  Overweight.Well developed, 70 y.o., female in no apparent distress HEENT:  Normocephalic/atraumatic, symmetric, EOMI, eyelids normal Neck:   Supple, no masses.  Lymphatics:  No cervical/ submandibular/ supraclavicular/ infraclavicular/ inguinal adenopathy Respiratory:  Respirations unlabored, no use of accessory muscles CV:   Deferred Breast:  Deferred Musculoskeletal: No CVA tenderness, normal muscle strength. Abdomen:  Soft, non-tender and nondistended. No evidence of hernia. No masses. Extremities:  No lymphedema, no erythema, non-tender. Skin:   Normal  inspection Neuro/Psych:  No focal motor deficit, no abnormal mental status. Normal gait. Normal affect. Alert and oriented to person, place, and time. Soft incisions.  Genito Urinary: Vaginal cuff well healed, no lesions or masses. Smooth, no lesions. Atrophic.  Rectovaginal:  Soft rectovaginal tissues, no palpable nodules.   Jordan Solo, MD  Cc: Dr. Lavonia Drafts (Referring Ob/Gyn) Javier Glazier, MD (PCP)

## 2020-04-02 NOTE — Telephone Encounter (Signed)
Spoke to pt, Reviewed Treatment plan that was discussed during office visit. Pt voiced understanding and all questions answered.   PLAN:  1. Cryptogenic left MCA stroke :  a. recovered well without residual deficits b. Continue aspirin 81 mg daily  and atorvastatin 40 mg daily for secondary stroke prevention.   c. Loop recorder has not shown atrial fibrillation thus far. Will continued to be monitored.  d. Discussed importance of close PCP follow up for aggressive stroke risk factor management 2. HTN: BP goal 130/90. Stable. Managed by PCP 3. HLD: LDL goal<70. Continue atorvastatin 40mg  daily. Managed and prescribed by PCP 4. DMII: A1c goal<7.0. managed by PCP 5. Chronic migraines post chemo: Worsened post stroke.  Improved with initiating amitriptyline but concern for possible side effect with morning grogginess.  Recommend trialing decreasing dosage to 25 mg nightly (currently 50 mg) for possible improvement.  Advised to call office with any worsening of headaches/migraines and may consider increasing dose by 10 mg nightly to improve tolerance or possibly change medication management.  6. Left ICA stenosis: Asymptomatic.  Obtain carotid ultrasound for surveillance monitoring

## 2020-04-02 NOTE — Patient Instructions (Signed)
Continue to use the Replens as needed for vaginal dryness. If this is not helping, it is safe to consider using vaginal estrogen cream to help with this symptom.  Please notify Dr Denman George at phone number [phone] if you notice vaginal bleeding, new pelvic or abdominal pains, bloating, feeling full easy, or a change in bladder or bowel function.   Please return to see Dr Denman George in 3 months for a check up.

## 2020-04-03 ENCOUNTER — Ambulatory Visit: Payer: Medicare Other | Admitting: Gynecologic Oncology

## 2020-04-03 NOTE — Progress Notes (Signed)
I agree with the above plan 

## 2020-04-08 ENCOUNTER — Ambulatory Visit (INDEPENDENT_AMBULATORY_CARE_PROVIDER_SITE_OTHER): Payer: Medicare Other

## 2020-04-08 DIAGNOSIS — I63412 Cerebral infarction due to embolism of left middle cerebral artery: Secondary | ICD-10-CM

## 2020-04-08 LAB — CUP PACEART REMOTE DEVICE CHECK
Date Time Interrogation Session: 20210929230450
Implantable Pulse Generator Implant Date: 20210113

## 2020-04-09 NOTE — Progress Notes (Signed)
Carelink Summary Report / Loop Recorder 

## 2020-04-10 ENCOUNTER — Ambulatory Visit (HOSPITAL_COMMUNITY)
Admission: RE | Admit: 2020-04-10 | Discharge: 2020-04-10 | Disposition: A | Discharge status: Home / Self Care | Payer: Medicare Other | Source: Ambulatory Visit | Attending: Adult Health | Admitting: Adult Health

## 2020-04-10 ENCOUNTER — Other Ambulatory Visit: Payer: Self-pay

## 2020-04-10 DIAGNOSIS — I6522 Occlusion and stenosis of left carotid artery: Secondary | ICD-10-CM | POA: Diagnosis present

## 2020-04-11 ENCOUNTER — Telehealth: Payer: Self-pay

## 2020-04-11 NOTE — Telephone Encounter (Signed)
Jordan Martinez is a 69 y.o. female was contacted and notified of the message below. Pt has no additional questions or concerns.

## 2020-04-11 NOTE — Telephone Encounter (Signed)
-----   Message from Frann Rider, NP sent at 04/10/2020  1:31 PM EDT ----- Please advise patient that recent imaging showed stable appearance of her bilateral carotid arteries with minimal narrowing showing improvement from prior CTA on 07/17/2019.

## 2020-05-10 LAB — CUP PACEART REMOTE DEVICE CHECK
Date Time Interrogation Session: 20211101230344
Implantable Pulse Generator Implant Date: 20210113

## 2020-05-13 ENCOUNTER — Ambulatory Visit (INDEPENDENT_AMBULATORY_CARE_PROVIDER_SITE_OTHER): Payer: Medicare Other

## 2020-05-13 DIAGNOSIS — I63412 Cerebral infarction due to embolism of left middle cerebral artery: Secondary | ICD-10-CM

## 2020-05-13 NOTE — Progress Notes (Signed)
Carelink Summary Report / Loop Recorder 

## 2020-05-22 DIAGNOSIS — M79645 Pain in left finger(s): Secondary | ICD-10-CM | POA: Insufficient documentation

## 2020-05-22 DIAGNOSIS — M67449 Ganglion, unspecified hand: Secondary | ICD-10-CM | POA: Insufficient documentation

## 2020-06-15 LAB — CUP PACEART REMOTE DEVICE CHECK
Date Time Interrogation Session: 20211204230322
Implantable Pulse Generator Implant Date: 20210113

## 2020-06-17 ENCOUNTER — Ambulatory Visit (INDEPENDENT_AMBULATORY_CARE_PROVIDER_SITE_OTHER): Payer: Medicare Other

## 2020-06-17 DIAGNOSIS — I63412 Cerebral infarction due to embolism of left middle cerebral artery: Secondary | ICD-10-CM

## 2020-06-20 ENCOUNTER — Encounter: Payer: Self-pay | Admitting: Gynecologic Oncology

## 2020-06-21 ENCOUNTER — Inpatient Hospital Stay: Payer: Medicare Other | Attending: Gynecologic Oncology | Admitting: Gynecologic Oncology

## 2020-06-21 ENCOUNTER — Other Ambulatory Visit: Payer: Self-pay

## 2020-06-21 VITALS — BP 130/77 | HR 76 | Temp 97.2°F | Resp 16 | Ht 64.0 in | Wt 191.6 lb

## 2020-06-21 DIAGNOSIS — Z8542 Personal history of malignant neoplasm of other parts of uterus: Secondary | ICD-10-CM | POA: Diagnosis present

## 2020-06-21 DIAGNOSIS — Z9221 Personal history of antineoplastic chemotherapy: Secondary | ICD-10-CM | POA: Diagnosis not present

## 2020-06-21 DIAGNOSIS — Z08 Encounter for follow-up examination after completed treatment for malignant neoplasm: Secondary | ICD-10-CM

## 2020-06-21 DIAGNOSIS — Z90710 Acquired absence of both cervix and uterus: Secondary | ICD-10-CM | POA: Insufficient documentation

## 2020-06-21 DIAGNOSIS — Z90722 Acquired absence of ovaries, bilateral: Secondary | ICD-10-CM | POA: Insufficient documentation

## 2020-06-21 DIAGNOSIS — Z1509 Genetic susceptibility to other malignant neoplasm: Secondary | ICD-10-CM | POA: Insufficient documentation

## 2020-06-21 DIAGNOSIS — C541 Malignant neoplasm of endometrium: Secondary | ICD-10-CM

## 2020-06-21 NOTE — Addendum Note (Signed)
Addended by: Delorise Jackson on: 06/21/2020 02:52 PM   Modules accepted: Orders

## 2020-06-21 NOTE — Patient Instructions (Signed)
Please notify Dr Taunya Goral at phone number [phone] if you notice vaginal bleeding, new pelvic or abdominal pains, bloating, feeling full easy, or a change in bladder or bowel function.   Please return to see Dr Braydee Shimkus in 3 months. 
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
## 2020-07-01 NOTE — Progress Notes (Signed)
Carelink Summary Report / Loop Recorder 

## 2020-07-18 DIAGNOSIS — M1812 Unilateral primary osteoarthritis of first carpometacarpal joint, left hand: Secondary | ICD-10-CM | POA: Insufficient documentation

## 2020-07-22 ENCOUNTER — Ambulatory Visit (INDEPENDENT_AMBULATORY_CARE_PROVIDER_SITE_OTHER): Payer: Medicare Other

## 2020-07-22 DIAGNOSIS — I63412 Cerebral infarction due to embolism of left middle cerebral artery: Secondary | ICD-10-CM | POA: Diagnosis not present

## 2020-07-24 LAB — CUP PACEART REMOTE DEVICE CHECK
Date Time Interrogation Session: 20220115230456
Implantable Pulse Generator Implant Date: 20210113

## 2020-08-05 NOTE — Progress Notes (Signed)
Carelink Summary Report / Loop Recorder 

## 2020-08-12 ENCOUNTER — Other Ambulatory Visit: Payer: Self-pay | Admitting: Hematology and Oncology

## 2020-08-23 LAB — CUP PACEART REMOTE DEVICE CHECK
Date Time Interrogation Session: 20220217230757
Implantable Pulse Generator Implant Date: 20210113

## 2020-08-26 ENCOUNTER — Ambulatory Visit (INDEPENDENT_AMBULATORY_CARE_PROVIDER_SITE_OTHER): Payer: Medicare Other

## 2020-08-26 DIAGNOSIS — I6602 Occlusion and stenosis of left middle cerebral artery: Secondary | ICD-10-CM

## 2020-08-29 NOTE — Progress Notes (Signed)
Carelink Summary Report / Loop Recorder 

## 2020-09-13 ENCOUNTER — Encounter: Payer: Self-pay | Admitting: Gynecologic Oncology
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
## 2020-09-16 ENCOUNTER — Other Ambulatory Visit: Payer: Self-pay

## 2020-09-16 ENCOUNTER — Inpatient Hospital Stay: Payer: Medicare Other | Attending: Gynecologic Oncology | Admitting: Gynecologic Oncology

## 2020-09-16 ENCOUNTER — Encounter: Payer: Self-pay | Admitting: Gynecologic Oncology

## 2020-09-16 VITALS — BP 128/78 | HR 65 | Temp 97.0°F | Resp 18 | Wt 186.6 lb

## 2020-09-16 DIAGNOSIS — Z8542 Personal history of malignant neoplasm of other parts of uterus: Secondary | ICD-10-CM

## 2020-09-16 DIAGNOSIS — Z1509 Genetic susceptibility to other malignant neoplasm: Secondary | ICD-10-CM | POA: Insufficient documentation

## 2020-09-16 DIAGNOSIS — C541 Malignant neoplasm of endometrium: Secondary | ICD-10-CM

## 2020-09-16 DIAGNOSIS — Z90722 Acquired absence of ovaries, bilateral: Secondary | ICD-10-CM | POA: Insufficient documentation

## 2020-09-16 DIAGNOSIS — Z9221 Personal history of antineoplastic chemotherapy: Secondary | ICD-10-CM | POA: Insufficient documentation

## 2020-09-16 DIAGNOSIS — Z08 Encounter for follow-up examination after completed treatment for malignant neoplasm: Secondary | ICD-10-CM | POA: Diagnosis present

## 2020-09-16 DIAGNOSIS — I10 Essential (primary) hypertension: Secondary | ICD-10-CM | POA: Diagnosis not present

## 2020-09-16 DIAGNOSIS — Z7984 Long term (current) use of oral hypoglycemic drugs: Secondary | ICD-10-CM | POA: Diagnosis not present

## 2020-09-16 DIAGNOSIS — Z90710 Acquired absence of both cervix and uterus: Secondary | ICD-10-CM | POA: Insufficient documentation

## 2020-09-16 DIAGNOSIS — N952 Postmenopausal atrophic vaginitis: Secondary | ICD-10-CM | POA: Diagnosis not present

## 2020-09-16 DIAGNOSIS — M1990 Unspecified osteoarthritis, unspecified site: Secondary | ICD-10-CM | POA: Diagnosis not present

## 2020-09-16 DIAGNOSIS — Z8673 Personal history of transient ischemic attack (TIA), and cerebral infarction without residual deficits: Secondary | ICD-10-CM | POA: Insufficient documentation

## 2020-09-16 DIAGNOSIS — F419 Anxiety disorder, unspecified: Secondary | ICD-10-CM | POA: Insufficient documentation

## 2020-09-16 DIAGNOSIS — Z79899 Other long term (current) drug therapy: Secondary | ICD-10-CM | POA: Diagnosis not present

## 2020-09-16 DIAGNOSIS — E119 Type 2 diabetes mellitus without complications: Secondary | ICD-10-CM | POA: Insufficient documentation

## 2020-09-16 DIAGNOSIS — R232 Flushing: Secondary | ICD-10-CM | POA: Diagnosis not present

## 2020-09-16 DIAGNOSIS — Z7982 Long term (current) use of aspirin: Secondary | ICD-10-CM | POA: Insufficient documentation

## 2020-09-16 NOTE — Patient Instructions (Signed)
Please notify Dr Denman George at phone number [phone] if you notice vaginal bleeding, new pelvic or abdominal pains, bloating, feeling full easy, or a change in bladder or bowel function.   Please contact Dr Serita Grit office (at [phone]) in or after June, 2022 to request an appointment with her for September 2022.

## 2020-09-24 LAB — CUP PACEART REMOTE DEVICE CHECK
Date Time Interrogation Session: 20220322230256
Implantable Pulse Generator Implant Date: 20210113

## 2020-09-30 ENCOUNTER — Ambulatory Visit (INDEPENDENT_AMBULATORY_CARE_PROVIDER_SITE_OTHER): Payer: Medicare Other

## 2020-09-30 DIAGNOSIS — I63412 Cerebral infarction due to embolism of left middle cerebral artery: Secondary | ICD-10-CM

## 2020-10-01 ENCOUNTER — Ambulatory Visit (INDEPENDENT_AMBULATORY_CARE_PROVIDER_SITE_OTHER): Payer: Medicare Other | Admitting: Adult Health

## 2020-10-01 ENCOUNTER — Encounter: Payer: Self-pay | Admitting: Adult Health

## 2020-10-01 VITALS — BP 135/83 | HR 71 | Ht 64.0 in | Wt 188.0 lb

## 2020-10-01 DIAGNOSIS — I639 Cerebral infarction, unspecified: Secondary | ICD-10-CM | POA: Diagnosis not present

## 2020-10-01 DIAGNOSIS — G43709 Chronic migraine without aura, not intractable, without status migrainosus: Secondary | ICD-10-CM | POA: Diagnosis not present

## 2020-10-01 DIAGNOSIS — R413 Other amnesia: Secondary | ICD-10-CM

## 2020-10-01 MED ORDER — AMITRIPTYLINE HCL 10 MG PO TABS: 10.0000 mg | ORAL_TABLET | Freq: Every evening | ORAL | 5 refills | Status: DC | PRN

## 2020-10-01 MED ORDER — AMITRIPTYLINE HCL 25 MG PO TABS: 25.0000 mg | ORAL_TABLET | Freq: Every day | ORAL | 3 refills | Status: DC

## 2020-10-01 NOTE — Progress Notes (Signed)
I agree with the above plan 

## 2020-10-01 NOTE — Progress Notes (Signed)
[facility] [address] [place]. [place] [address] [phone]       STROKE FOLLOW UP NOTE  [name] Date of Birth:  04/15/1951 Medical Record Number:  [id number]   Reason for Referral: stroke follow up    CHIEF COMPLAINT:  Chief Complaint  Patient presents with  . Follow-up    RM 14 with best friend (edward) Pt is well, having some forgetfulness, has about 2-3 headaches a week over eyes. Headaches last under a hr     HPI:  Today, 10/01/2020, [name] returns for stroke and headache 12-monthfollow-up accompanied by her friend, EMikel Cellafrom a stroke standpoint without new or recurring stroke/TIA symptoms Reports compliance on aspirin and atorvastatin -denies associated side effects Blood pressure today 135/83 Loop recorder has not shown atrial fibrillation thus far  Headaches have been stable only experiencing one migraine since prior visit. She took an additional amitriptyline 25 mg tablet with resolution.  She will experience very mild headaches approximately 2-3 times a week typically after dinner prior to schedule amitriptyline dosage but will quickly resolve after taking amitriptyline.  EPercell Millerreports some difficulty with memory and anxiety She will have difficulty remembering numbers such as her address or cell phone password -this is been consistent since her stroke She feels as though her anxiety is greatly improving where she is not consistently fearful of having additional strokes and is sleeping better at night  She is being followed by pain clinic for b/l knee pain recently receiving COOLIEF therapy by Dr. BFranchot Mimesbut denies much benefit.  She has scheduled follow-up visit with him tomorrow for further discussion  No further concerns at this time     History provided for reference purposes only Update 04/01/2020 JM patient returns for stroke f/u. Stable from stroke standpoint since prior visit without residual deficits.  Denies new stroke/TIA symptoms. Chronic headaches stable on amitriptyline 538mnightly but does report morning grogginess.  Remains on aspirin 8157maily and atorvastatin without side effects. Blood pressure today 138/78. Loop recorder has not shown atrial fibrillation thus far.  She continues to be followed by pain management for bilateral knee osteoarthritis.  Does report occasional anxiety due to medical conditions but overall stable.  No further concerns at this time.  Update 11/29/2019 JM: [name] being seen for follow-up regarding left MCA stroke in 07/2018.  She has been doing well from a stroke standpoint without new or reoccurring stroke/TIA symptoms.  Underlying chronic headaches worsened post stroke and initiated amitriptyline with eventual increase to 78m97mth great benefit. Reports mild side effects such as mild fatigue the following day and mild skin photosensitivity. She does endorse improvement of anxiety as well with use of amitriptyline. Continues on aspirin and atorvastatin for secondary stroke prevention.  Blood pressure today 118/62.  Loop recorder has not shown atrial fibrillation thus far.  No further concerns at this time.  Initial visit 08/23/2019 JM: [name] 70 y71r old female who is being seen today for hospital follow up.  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  Prior history of frontal headaches worsened with eyestrain and was told due to prior chemo treatments.  Headache intensity worsened post stroke gradually returning to baseline but continues to experience frequently.  At times she also experienced cloudiness but this has been unchanged.  She will be seeing her ophthalmologist next week for 1 year follow-up.  Describes headaches  as a pressure sensation in frontal region and behind eyes bilaterally associated with photophobia and phonophobia but denies nausea/vomiting.  She will  occasionally experience small flicker of light in her periphery prior to headache onset and is unsure if headaches are worse when she experiences visual aura.  She has not previously been treated for headaches or been diagnosed with migraine headaches.  She also endorses increased anxiety due to fear of recurrent stroke.  She has completed 3 weeks DAPT and continues on aspirin alone without bleeding or bruising.  Continues on atorvastatin 40 mg daily without myalgias.  Blood pressure today satisfactory at 130/70.  She has not had follow-up with IR as recommended post revascularization during admission.  Loop recorder is not shown atrial fibrillation thus far.  No further concerns at this time.  Hospital admission 07/17/2019: Ms.Jordan Martinez a 70 y.o.femalewith history of diabetes, endometrial cancer, HNPCC,hyperlipidemia, hypertensionwho presented on 07/17/2019 with aphasia.  Evaluated by stroke team and  Dr. Erlinda Hong with stroke work-up revealing left MCA infarct due to left M2 occlusion s/p TPA and IR with TICI 3 revascularization of distal left M2 with resultant small SAH left sylvian fissure, infarct embolic secondary to unknown source.  2D echo unremarkable.  LE venous Doppler negative.  Loop recorder placed to rule out A. fib as potential stroke etiology.  Recommended DAPT for 3 weeks and aspirin alone.  Initiated atorvastatin 40 mg daily with LDL 120.  HTN stable.  Controlled DM with A1c 7.0.  Current tobacco use with smoking cessation counseling provided.  Other stroke risk factors include advanced age, EtOH use and obesity but no prior history of stroke.  Discharged back to [facility] with recommendation of physical therapy.  Stroke:L MCAinfarct due to left M2 occlusions/p tPA and IRwith resultant small SAH left sylvian fissure, infarct embolic secondary tounknownsource  Code Stroke CT headhyperdense L MCA.ASPECTS 10.   CTA head & neckL M2 MCA ELVO. L ICA 50% stenosis,mild L siphon  plaque. Mild atherosclerosis throughout  CT perfusion7 mL core, 53 mL penumbra L MCA territory   Cerebral angio TICI3 revascularization distal L M2   F/u CT mild SAH L sylvian fissure. No infarct  MRIpatchy small L MCA territory infarct (L insula, L frontal operculum, L temporal lobe). Small SAHD L sylvian fissure stable.  MRA Unremarkable   2D EchoEF 65-70%. No source of embolus   LE venous dopplerNo DVT  Loop recorder placed 1/13 to look for AF as source of stroke ([place])  LDL120  HgbA1c7.0  aspirin 81 mg dailyprior to admission, will start aspirin 81 and plavix 75 at d/c. Continue DAPT x 3 weeks then aspirin alone.    Therapy recommendations:continue OP PT   Disposition:return to [facility]    ROS:   14 system review of systems performed and negative with exception of those listed in HPI  PMH:  Past Medical History:  Diagnosis Date  . Anxiety    on meds  . Arthritis    on PRN meds  . Cataract    bilateral cat. ext. with lens implant- in [place]  . Diabetes mellitus without complication (HCC)    diet controlled. States always has borderline HgbA1C  . Endometrial cancer ([place])    endometrial CA-2019  . Family history of colon cancer    daughter 16 colon CA  . Family history of genetic mutation for hereditary nonpolyposis colorectal cancer (HNPCC)   . GERD (gastroesophageal reflux disease)    on meds  . High cholesterol    on meds  .  History of kidney stones    years   . Hypertension    on meds  . Neuropathy   . Sensation of pressure in bladder area   . Stroke ([place]) 07/17/2019  . Uterine cramping     PSH:  Past Surgical History:  Procedure Laterality Date  . burnt the nerves  Right 04/29/2018   in right knee   . burnt the nerves  Left 04/22/2018   left knee nerve   . COLONOSCOPY  12/2018   [place]- 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  . cystoscopy kidney stone   1985  . HYSTERECTOMY ABDOMINAL WITH SALPINGO-OOPHORECTOMY  2019  . IR CT  HEAD LTD  07/18/2019  . IR IMAGING GUIDED PORT INSERTION  06/15/2018  . IR PERCUTANEOUS ART THROMBECTOMY/INFUSION INTRACRANIAL INC DIAG ANGIO  07/18/2019  . IR REMOVAL TUN ACCESS W/ PORT W/O FL MOD SED  12/01/2018  . LOOP RECORDER INSERTION N/A 07/19/2019   Procedure: LOOP RECORDER INSERTION;  Surgeon: Constance Haw, MD;  Location: [facility];  Service: Cardiovascular;  Laterality: N/A;  . RADIOLOGY WITH ANESTHESIA N/A 07/17/2019   Procedure: IR WITH ANESTHESIA;  Surgeon: Luanne Bras, MD;  Location: [facility];  Service: Radiology;  Laterality: N/A;  . REPLACEMENT TOTAL KNEE BILATERAL Bilateral 2018   Right x 2. 2019 undergoing intermittent injection "RFA" procedures with Dr. Sheliah Mends [facility]  . REVISION TOTAL KNEE ARTHROPLASTY Right 03/2017  . ROBOTIC ASSISTED TOTAL HYSTERECTOMY WITH BILATERAL SALPINGO OOPHERECTOMY N/A 05/03/2018   Procedure: XI ROBOTIC ASSISTED TOTAL HYSTERECTOMY WITH BILATERAL SALPINGO OOPHORECTOMY;  Surgeon: Everitt Amber, MD;  Location: [facility];  Service: Gynecology;  Laterality: N/A;  . SENTINEL NODE BIOPSY N/A 05/03/2018   Procedure: SENTINEL NODE BIOPSY;  Surgeon: Everitt Amber, MD;  Location: [facility];  Service: Gynecology;  Laterality: N/A;  . TONSILLECTOMY AND ADENOIDECTOMY    . TUBAL LIGATION      Social History:  Social History   Socioeconomic History  . Marital status: Divorced    Spouse name: Not on file  . Number of children: 2  . Years of education: Not on file  . Highest education level: Not on file  Occupational History  . Occupation: retired Marine scientist  Tobacco Use  . Smoking status: Current Every Day Smoker    Packs/day: 0.25    Years: 40.00    Pack years: 10.00    Types: Cigarettes  . Smokeless tobacco: Never Used  . Tobacco comment: 4-5 loose cigarettes per day , over 20 year hx of smoking   Vaping Use  . Vaping Use: Never used  Substance and Sexual Activity  . Alcohol use: Yes    Alcohol/week: 14.0 standard drinks    Types:  14 Glasses of wine per week    Comment: about 2 glasses wine daily.   . Drug use: Never  . Sexual activity: Not Currently    Birth control/protection: None  Other Topics Concern  . Not on file  Social History Narrative  . Not on file   Social Determinants of Health   Financial Resource Strain: Not on file  Food Insecurity: Not on file  Transportation Needs: Not on file  Physical Activity: Not on file  Stress: Not on file  Social Connections: Not on file  Intimate Partner Violence: Not on file    Family History:  Family History  Adopted: Yes  Problem Relation Age of Onset  . Colon cancer Daughter 54       Lynch syndrome - MSH6 +  .  Colon polyps Daughter 44  . Healthy Son     Medications:   Current Outpatient Medications on File Prior to Visit  Medication Sig Dispense Refill  . acetaminophen (TYLENOL) 500 MG tablet Take 1,000 mg by mouth once as needed for mild pain or moderate pain.    Marland Kitchen amitriptyline (ELAVIL) 25 MG tablet Take 25 mg by mouth at bedtime.    Marland Kitchen amoxicillin (AMOXIL) 500 MG capsule Take 2,000 mg by mouth once as needed (dental work).    Marland Kitchen aspirin EC 81 MG tablet Take 81 mg by mouth daily.    Marland Kitchen atenolol (TENORMIN) 50 MG tablet Take 75 mg by mouth daily.    Marland Kitchen atorvastatin (LIPITOR) 40 MG tablet Take 1 tablet (40 mg total) by mouth at bedtime. 30 tablet 2  . Biotin 5000 MCG TABS Take 5,000 mcg by mouth daily.    . Cholecalciferol (VITAMIN D3) 50 MCG (2000 UT) TABS Take 1 tablet by mouth daily in the afternoon.    . cyclobenzaprine (FLEXERIL) 5 MG tablet Take 1 tablet (5 mg total) by mouth 3 (three) times daily. (Patient taking differently: Take 5-10 mg by mouth 3 (three) times daily as needed for muscle spasms.) 90 tablet 1  . Lancets (ONETOUCH DELICA PLUS [id number]) MISC Apply topically 2 (two) times daily.    Marland Kitchen lisinopril (ZESTRIL) 10 MG tablet Take 10 mg by mouth daily.    . metFORMIN (GLUCOPHAGE) 500 MG tablet TAKE 1 TABLET BY MOUTH TWICE A DAY WITH MEALS 60  tablet 1  . Omega-3 Fatty Acids (FISH OIL) 1200 MG CAPS Take 1,200 mg by mouth daily.     Marland Kitchen omeprazole (PRILOSEC) 40 MG capsule Take 1 capsule (40 mg total) by mouth daily. 90 capsule 3  . Propylene Glycol 0.6 % SOLN Place 2 drops into both eyes 4 (four) times daily.    . traMADol (ULTRAM) 50 MG tablet Take 50-100 mg by mouth every 6 (six) hours as needed for pain.     Current Facility-Administered Medications on File Prior to Visit  Medication Dose Route Frequency Provider Last Rate Last Admin  . 0.9 %  sodium chloride infusion  500 mL Intravenous Once [place] Park, MD        Allergies:   Allergies  Allergen Reactions  . Amlodipine     Blurred vision   . Compazine [Prochlorperazine Edisylate]     Stroke symptoms,  . Gabapentin     Blurred vision and nystagmus  . Lyrica [Pregabalin]     Blurred Vision   . Sertraline Other (See Comments)    Visual disturbances.   . Chlorhexidine Rash    Blisters and redness to chest     Physical Exam  Vitals:   10/01/20 1245  BP: 135/83  Pulse: 71  Weight: 188 lb (85.3 kg)  Height: _0  (1.626 m)   Body mass index is 32.27 kg/m. No exam data present  General: well developed, well nourished, pleasant middle-age Caucasian female, seated, in no evident distress Head: head normocephalic and atraumatic.   Neck: supple with no carotid or supraclavicular bruits Cardiovascular: regular rate and rhythm, no murmurs Musculoskeletal: no deformity Skin:  no rash/petichiae Vascular:  Normal pulses all extremities   Neurologic Exam Mental Status: Awake and fully alert. Fluent speech and language. Oriented to place and time. Recent subjectively mildly impaired and remote memory intact. Attention span, concentration and fund of knowledge appropriate during visit. Mood and affect appropriate.  Cranial Nerves: Pupils equal, briskly reactive to light.  Extraocular movements full without nystagmus. Visual fields full to confrontation. Hearing  intact. Facial sensation intact. Face, tongue, palate moves normally and symmetrically.  Motor: Normal bulk and tone. Normal strength in all tested extremity muscles. Sensory.: intact to touch , pinprick , position and vibratory sensation.  Coordination: Rapid alternating movements normal in all extremities. Finger-to-nose and heel-to-shin performed accurately bilaterally. Gait and Station: Arises from chair without difficulty. Stance is normal. Gait demonstrates normal stride length and balance without use of assistive device Reflexes: 1+ and symmetric. Toes downgoing.        ASSESSMENT: Jordan Martinez is a 70 y.o. year old female presented with aphasia on 07/17/2019 with stroke work-up revealing left MCA infarct due to left M2 occlusion status post TPA and IR with resultant small SAH left sylvian fissure, infarct secondary to unknown source. Loop recorder placed.  Vascular risk factors include history of endometrial cancer with completion of chemo 2019, HTN, HLD, DM, L ICA 50% stenosis.       PLAN:  1. Cryptogenic left MCA stroke :  a. Continue aspirin 81 mg daily  and atorvastatin 40 mg daily for secondary stroke prevention.   b. Loop recorder has not shown atrial fibrillation thus far. Will continued to be monitored.  c. HTN: BP goal 130/90.  Well-controlled on current regimen per PCP d. HLD: LDL goal<70. Continue atorvastatin 73m daily.  Reports recent lab work satisfactory - unable to view via epic e. DMII: A1c goal<7.0. managed by PCP - reports recent A1c 7.2 2. Chronic migraines post chemo:  a. Currently well controlled with occasional breakthrough migraines - only 1 over the past 4 months b. Continue amitriptyline 25 mg nightly and use of amitriptyline 10 mg nightly as needed for breakthrough migraine  3. Left ICA stenosis: Asymptomatic.  Carotid duplex 04/2020 b/l ICA 1 to 39% stenosis 4. Memory concerns: Likely multifactorial in setting of prior left MCA stroke and moderate  anxiety.  Able to maintain ADLs and IADLs independently.  Discussed use of memory exercises, stress relaxation techniques and routine physical activity.  She declines further treatment for anxiety with medication or counseling at this present time. Continue to monitor    Follow up in 6 months or call earlier if needed   CC:  [facility] provider: Dr. STona Sensing MChristian Mate MD    I spent 35 minutes of face-to-face and non-face-to-face time with patient and friend.  This included previsit chart review, lab review, study review, order entry, electronic health record documentation, patient education and discussion regarding history of cryptogenic stroke, review of loop recorder, importance of managing stroke risk factors, chronic migraines and ongoing management, carotid stenosis at recent ultrasound, memory concerns and answered all other questions to patient and friends satisfaction   JFrann Rider AGNP-BC  [facility] [address] [address] Phone [phone] [phone]: This document was prepared with digital dictation and possible smart phrase technology. Any transcriptional errors that result from this process are unintentional.

## 2020-10-01 NOTE — Patient Instructions (Signed)
Continue amitriptyline 25 mg nightly for headache prevention Use of amitriptyline 10 mg tablet as needed for breakthrough headaches  Continue aspirin 81 mg daily  and atorvastatin for secondary stroke prevention  Continue to follow up with PCP regarding cholesterol, blood pressure and diabetes management  Maintain strict control of hypertension with blood pressure goal below 130/90, diabetes with hemoglobin A1c goal below 7% and cholesterol with LDL cholesterol (bad cholesterol) goal below 70 mg/dL.       Followup in the future with me in 6 months or call earlier if needed       Thank you for coming to see [place] at [facility]. I hope we have been able to provide you high quality care today.  You may receive a patient satisfaction survey over the next few weeks. We would appreciate your feedback and comments so that we may continue to improve ourselves and the health of our patients.

## 2020-10-09 ENCOUNTER — Other Ambulatory Visit: Payer: Self-pay | Admitting: Adult Health

## 2020-10-10 NOTE — Progress Notes (Signed)
Carelink Summary Report / Loop Recorder 

## 2020-10-30 ENCOUNTER — Ambulatory Visit (INDEPENDENT_AMBULATORY_CARE_PROVIDER_SITE_OTHER): Payer: Medicare Other

## 2020-10-30 DIAGNOSIS — I63412 Cerebral infarction due to embolism of left middle cerebral artery: Secondary | ICD-10-CM

## 2020-11-04 LAB — CUP PACEART REMOTE DEVICE CHECK
Date Time Interrogation Session: 20220424230617
Implantable Pulse Generator Implant Date: 20210113

## 2020-11-20 NOTE — Progress Notes (Signed)
Carelink Summary Report / Loop Recorder 

## 2020-12-02 LAB — CUP PACEART REMOTE DEVICE CHECK
Date Time Interrogation Session: 20220527230344
Implantable Pulse Generator Implant Date: 20210113

## 2020-12-03 ENCOUNTER — Ambulatory Visit (INDEPENDENT_AMBULATORY_CARE_PROVIDER_SITE_OTHER): Payer: Medicare Other

## 2020-12-03 DIAGNOSIS — I63412 Cerebral infarction due to embolism of left middle cerebral artery: Secondary | ICD-10-CM | POA: Diagnosis not present

## 2020-12-10 ENCOUNTER — Other Ambulatory Visit: Payer: Self-pay | Admitting: Adult Health

## 2020-12-13 ENCOUNTER — Telehealth: Payer: Self-pay | Admitting: Adult Health

## 2020-12-13 NOTE — Telephone Encounter (Signed)
Pt called to r/s her appt sooner due to having tingling in her face on the R side that goes down to her neck.

## 2020-12-16 ENCOUNTER — Ambulatory Visit: Payer: Medicare Other | Admitting: Adult Health

## 2020-12-16 NOTE — Telephone Encounter (Signed)
Has appt 12-18-20 with Dr. Leonie Man.

## 2020-12-18 ENCOUNTER — Ambulatory Visit (INDEPENDENT_AMBULATORY_CARE_PROVIDER_SITE_OTHER): Payer: Medicare Other | Admitting: Neurology

## 2020-12-18 ENCOUNTER — Telehealth: Payer: Self-pay | Admitting: Neurology

## 2020-12-18 ENCOUNTER — Other Ambulatory Visit: Payer: Self-pay

## 2020-12-18 ENCOUNTER — Encounter: Payer: Self-pay | Admitting: Neurology

## 2020-12-18 ENCOUNTER — Ambulatory Visit: Payer: Medicare Other | Admitting: Neurology

## 2020-12-18 VITALS — BP 159/85 | HR 75 | Ht 64.5 in | Wt 189.2 lb

## 2020-12-18 DIAGNOSIS — R202 Paresthesia of skin: Secondary | ICD-10-CM

## 2020-12-18 DIAGNOSIS — Z8673 Personal history of transient ischemic attack (TIA), and cerebral infarction without residual deficits: Secondary | ICD-10-CM | POA: Diagnosis not present

## 2020-12-18 DIAGNOSIS — I639 Cerebral infarction, unspecified: Secondary | ICD-10-CM | POA: Diagnosis not present

## 2020-12-18 MED ORDER — TOPIRAMATE 25 MG PO TABS: 25.0000 mg | ORAL_TABLET | Freq: Two times a day (BID) | ORAL | 3 refills | Status: DC

## 2020-12-18 NOTE — Telephone Encounter (Signed)
MRI brain w/wo & cervical spine w/wo gi obtains auth  sent to gi for scheduling

## 2020-12-18 NOTE — Patient Instructions (Signed)
I had a long discussion with the patient regarding her symptoms of intermittent right face and neck paresthesias and discussed plan for evaluation and answered questions.  I recommend we check MRI scan of the brain with and without contrast and MRI cervical spine with and without contrast to rule out any structural skull base lesion.  Trial of Topamax 25 mg twice daily to help with paresthesias.  Continue aspirin daily for stroke prevention and maintain aggressive risk factor modification with strict control of hypertension with blood pressure goal below 130/90, lipids with LDL cholesterol goal below 70 mg percent and diabetes with hemoglobin A1c goal below 6.5%.  She will return for follow-up in the future in 2 months or call earlier if necessary.

## 2020-12-18 NOTE — Telephone Encounter (Signed)
Pt called, pharmacy advised me should not take 1 tablet for topiramate (TOPAMAX) 25 MG tablet If can not tolerate , how would I stop the taking the medication? In the past have had kidney stone, am fearful of the side effect can get kidney stones.   Do I need to wean off of amitriptyline (ELAVIL) 10 MG tablet. Would like a call from nurse.

## 2020-12-18 NOTE — Telephone Encounter (Signed)
Pt needed follow-up in two months per check out note. When I offered pt next available with Dr. Leonie Man (October) she became upset and stated that she was supposed to come back in August. She asked if she could see Janett Billow, NP sooner than Dr. Leonie Man. Is it ok for this pt to see Janett Billow?

## 2020-12-18 NOTE — Telephone Encounter (Signed)
Pt is okay to follow up with NP in August per Dr. Leonie Man.

## 2020-12-18 NOTE — Progress Notes (Signed)
[facility] [address] [place]. [place] [address] [phone]       OFFICE CONSULT NOTE  [name] Date of Birth:  1950-07-13 Medical Record Number:  [id number]   Referring MD:  Guil[place] Shi  Reason for Referral: Facial tingling and numbness  HPI: Jordan Martinez is a pleasant 70 year old Caucasian lady seen today for office consultation visit right face and neck numbness.  History is obtained from the patient and review of referral notes and I personally reviewed electronic medical records in Care Everywhere and pertinent imaging films in PACS.  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  Patient has followed up for stroke follow-up in the clinic and was last seen by [facility] nurse practitioner on 10/01/2020.  Patient has a new complaint of tingling and numbness involving the outer aspect of the lower right face as well as neck.  This began about 2 months ago.  Initially this feeling would go down into the right arm as well as forearm.  This lasted only few days and since then she has been having intermittent tingling sensation on the outer aspect of the right lower face as well as the neck.  This may occur a couple of times a day.  Lasts only 1 to 2 minutes.  There are no specific triggers.  This is annoying but not very bothersome.  Occasionally she has discomfort and pain in the base of her neck.  She denies any true radicular pain or severe neck pain.  There is no history of neck injury, motor vehicle accident.  She recently saw her orthopedic physician and [place] who did x-rays of the cervical spine which showed mild disc degenerative changes.  Patient also has a history of headaches following her stroke and was prescribed  Elavil 25 mg at night which seems to help she still has headaches about couple of times a week but they are tolerable and not as bothersome.  She is currently on aspirin for stroke prevention and is having mild bruising but no major bleeding episodes.  He is tolerating Lipitor well with only occasional leg cramps.  Blood pressure is usually well controlled at home the today it is elevated in office at 159/85.  She plans to see a primary care physician later this month and have follow-up lipid profile and hemoglobin A1c checked.  She is extremely anxious that she may have cancer and worries a lot about this.  She has a daughter in [place] who is undergoing surgery this week and patient had to cancel her trip and she wants to finish her work-up prior to going there. ROS:   14 system review of systems is positive for numbness, tingling, neck pain, headache and all other systems negative  PMH:  Past Medical History:  Diagnosis Date   Anxiety    on meds   Arthritis    on PRN meds   Cataract    bilateral cat. ext. with lens implant- in [place]   Diabetes mellitus without complication ([place])    diet controlled. States always has borderline HgbA1C   Endometrial cancer ([place])    endometrial CA-2019   Family history of colon cancer    daughter 34 colon CA   Family history of genetic mutation for hereditary nonpolyposis colorectal  cancer ([facility])    GERD (gastroesophageal reflux disease)    on meds   High cholesterol    on meds   History of kidney stones    years    Hypertension    on meds   Neuropathy    Sensation of pressure in bladder area    Stroke ([place]) 07/17/2019   Uterine cramping     Social History:  Social History   Socioeconomic History   Marital status: Divorced    Spouse name: Not on file   Number of children: 2   Years of education: Not on file   Highest education level: Not on file  Occupational History   Occupation: retired Marine scientist  Tobacco Use   Smoking status: Every Day     Packs/day: 0.25    Years: 40.00    Pack years: 10.00    Types: Cigarettes   Smokeless tobacco: Never   Tobacco comments:    4-5 loose cigarettes per day , over 20 year hx of smoking   Vaping Use   Vaping Use: Never used  Substance and Sexual Activity   Alcohol use: Yes    Alcohol/week: 14.0 standard drinks    Types: 14 Glasses of wine per week    Comment: about 2 glasses wine daily.    Drug use: Never   Sexual activity: Not Currently    Birth control/protection: None  Other Topics Concern   Not on file  Social History Narrative   Not on file   Social Determinants of Health   Financial Resource Strain: Not on file  Food Insecurity: Not on file  Transportation Needs: Not on file  Physical Activity: Not on file  Stress: Not on file  Social Connections: Not on file  Intimate Partner Violence: Not on file    Medications:   Current Outpatient Medications on File Prior to Visit  Medication Sig Dispense Refill   acetaminophen (TYLENOL) 500 MG tablet Take 1,000 mg by mouth once as needed for mild pain or moderate pain.     amitriptyline (ELAVIL) 10 MG tablet Take 1 tablet (10 mg total) by mouth at bedtime as needed for sleep (migraine). 15 tablet 5   amitriptyline (ELAVIL) 25 MG tablet Take 1 tablet (25 mg total) by mouth at bedtime. 90 tablet 3   amoxicillin (AMOXIL) 500 MG capsule Take 2,000 mg by mouth once as needed (dental work).     aspirin EC 81 MG tablet Take 81 mg by mouth daily.     atenolol (TENORMIN) 50 MG tablet Take 75 mg by mouth daily.     atorvastatin (LIPITOR) 40 MG tablet Take 1 tablet (40 mg total) by mouth at bedtime. 30 tablet 2   Biotin 5000 MCG TABS Take 5,000 mcg by mouth daily.     Cholecalciferol (VITAMIN D3) 50 MCG (2000 UT) TABS Take 1 tablet by mouth daily in the afternoon.     cyclobenzaprine (FLEXERIL) 5 MG tablet Take 1 tablet (5 mg total) by mouth 3 (three) times daily. (Patient taking differently: Take 5-10 mg by mouth 3 (three) times daily as  needed for muscle spasms.) 90 tablet 1   Lancets (ONETOUCH DELICA PLUS [id number]) MISC Apply topically daily.     lisinopril (ZESTRIL) 10 MG tablet Take 10 mg by mouth daily.     metFORMIN (GLUCOPHAGE) 500 MG tablet TAKE 1 TABLET BY MOUTH TWICE A DAY WITH MEALS 60 tablet 1   Omega-3 Fatty Acids (FISH OIL) 1200 MG CAPS Take 1,200 mg by  mouth daily.      omeprazole (PRILOSEC) 40 MG capsule Take 1 capsule (40 mg total) by mouth daily. 90 capsule 3   Propylene Glycol 0.6 % SOLN Place 2 drops into both eyes 4 (four) times daily.     traMADol (ULTRAM) 50 MG tablet Take 50-100 mg by mouth every 6 (six) hours as needed for pain.     No current facility-administered medications on file prior to visit.    Allergies:   Allergies  Allergen Reactions   Amlodipine     Blurred vision    Compazine [Prochlorperazine Edisylate]     Stroke symptoms,   Gabapentin     Blurred vision and nystagmus   Lyrica [Pregabalin]     Blurred Vision    Sertraline Other (See Comments)    Visual disturbances.    Chlorhexidine Rash    Blisters and redness to chest    Physical Exam General: Mildly obese middle-aged Caucasian lady d, seated, in no evident distress.  She appears anxious Head: head normocephalic and atraumatic.   Neck: supple with no carotid or supraclavicular bruits Cardiovascular: regular rate and rhythm, no murmurs Musculoskeletal: no deformity Skin:  no rash/petichiae Vascular:  Normal pulses all extremities  Neurologic Exam Mental Status: Awake and fully alert. Oriented to place and time. Recent and remote memory intact. Attention span, concentration and fund of knowledge appropriate. Mood and affect appropriate.  Cranial Nerves: Fundoscopic exam reveals sharp disc margins. Pupils equal, briskly reactive to light. Extraocular movements full without nystagmus. Visual fields full to confrontation. Hearing intact. Facial sensation intact. Face, tongue, palate moves normally and symmetrically.   Motor: Normal bulk and tone. Normal strength in all tested extremity muscles.  Mildly diminished fine finger movements on the right.  Orbits left over right upper extremity. Sensory.: intact to touch , pinprick , position and vibratory sensation.  Coordination: Rapid alternating movements normal in all extremities. Finger-to-nose and heel-to-shin performed accurately bilaterally. Gait and Station: Arises from chair without difficulty. Stance is normal. Gait demonstrates normal stride length and balance . Able to heel, toe and tandem walk with mild difficulty.  Reflexes: 2+ brisk and symmetric.  Positive Hoffmann sign in right hand.  0 toes downgoing.   NIHSS  0 Modified Rankin  1   ASSESSMENT: 70 year old Caucasian lady with intermittent right lower face and neck paresthesias of unclear etiology.  Evaluation for skull base or high cervical lesion with structural imaging is necessary.  Prior history of cryptogenic left MCA infarct in January 2021 treated with IV tPA and mechanical thrombectomy with excellent clinical outcome.  Vascular risk factors of hypertension hyperlipidemia, diabetes and mild obesity.     PLAN: I had a long discussion with the patient regarding her symptoms of intermittent right face and neck paresthesias and discussed plan for evaluation and answered questions.  I recommend we check MRI scan of the brain with and without contrast and MRI cervical spine with and without contrast to rule out any structural skull base lesion.  Trial of Topamax 25 mg twice daily to help with paresthesias.  Continue aspirin daily for stroke prevention and maintain aggressive risk factor modification with strict control of hypertension with blood pressure goal below 130/90, lipids with LDL cholesterol goal below 70 mg percent and diabetes with hemoglobin A1c goal below 6.5%.  She will return for follow-up in the future in 2 months or call earlier if necessary.  Greater than 50% time during this  45-minute consultation visit was spent in counseling and coordination  of care and discussion about numbness and prior stroke and answering questions. Antony Contras, MD  Note: This document was prepared with digital dictation and possible smart phrase technology. Any transcriptional errors that result from this process are unintentional.

## 2020-12-19 NOTE — Telephone Encounter (Signed)
Please see previous message thread.

## 2020-12-24 NOTE — Progress Notes (Signed)
Carelink Summary Report / Loop Recorder 

## 2020-12-29 ENCOUNTER — Ambulatory Visit
Admission: RE | Admit: 2020-12-29 | Discharge: 2020-12-29 | Disposition: A | Discharge status: Home / Self Care | Payer: Medicare Other | Source: Ambulatory Visit | Attending: Neurology | Admitting: Neurology

## 2020-12-29 ENCOUNTER — Other Ambulatory Visit: Payer: Self-pay

## 2020-12-29 DIAGNOSIS — R202 Paresthesia of skin: Secondary | ICD-10-CM | POA: Diagnosis not present

## 2020-12-29 MED ORDER — GADOBENATE DIMEGLUMINE 529 MG/ML IV SOLN
15.0000 mL | Freq: Once | INTRAVENOUS | Status: AC | PRN
  Administered 2020-12-29: 15 mL via INTRAVENOUS

## 2020-12-30 ENCOUNTER — Encounter: Payer: Self-pay | Admitting: Adult Health

## 2021-01-03 NOTE — Progress Notes (Signed)
Kindly inform the patient that MRI scan of the brain shows stable appearance of old strokes on the left side but no new stroke or worrisome finding.

## 2021-01-03 NOTE — Progress Notes (Signed)
Kindly inform the patient that MRI scan of the cervical spine shows degenerative wear-and-tear changes with mild narrowing of the spinal Canal between the fourth to the sixth vertebrae and severe narrowing of the foraminal through which the nerves come out on both sides between the fourth and fifth and fifth and sixth vertebrae.

## 2021-01-07 ENCOUNTER — Ambulatory Visit (INDEPENDENT_AMBULATORY_CARE_PROVIDER_SITE_OTHER): Payer: Medicare Other

## 2021-01-07 ENCOUNTER — Telehealth: Payer: Self-pay | Admitting: *Deleted

## 2021-01-07 DIAGNOSIS — I63412 Cerebral infarction due to embolism of left middle cerebral artery: Secondary | ICD-10-CM | POA: Diagnosis not present

## 2021-01-07 NOTE — Telephone Encounter (Signed)
-----   Message from Garvin Fila, MD sent at 01/03/2021  3:54 PM EDT ----- Mitchell Heir inform the patient that MRI scan of the brain shows stable appearance of old strokes on the left side but no new stroke or worrisome finding.

## 2021-01-07 NOTE — Telephone Encounter (Addendum)
I spoke to the patient and provided her with the results. She would like to come in early to further review testing and discuss treatment options. Appt changed.

## 2021-01-07 NOTE — Telephone Encounter (Signed)
-----   Message from Garvin Fila, MD sent at 01/03/2021  3:52 PM EDT ----- Mitchell Heir inform the patient that MRI scan of the cervical spine shows degenerative wear-and-tear changes with mild narrowing of the spinal Canal between the fourth to the sixth vertebrae and severe narrowing of the foraminal through which the nerves come out on both sides between the fourth and fifth and fifth and sixth vertebrae.

## 2021-01-07 NOTE — Telephone Encounter (Signed)
I spoke to the patient and provided her with the results. She would like to come in early to further review testing and discuss treatment options. Appt changed.

## 2021-01-07 NOTE — Telephone Encounter (Signed)
Note in Epic concerning results. The patient has been called. She moved her appt to an earlier date for further discussion and for treatment options.

## 2021-01-08 LAB — CUP PACEART REMOTE DEVICE CHECK
Date Time Interrogation Session: 20220629230648
Implantable Pulse Generator Implant Date: 20210113

## 2021-01-10 ENCOUNTER — Encounter: Payer: Self-pay | Admitting: Gastroenterology

## 2021-01-10 ENCOUNTER — Telehealth: Payer: Self-pay | Admitting: *Deleted

## 2021-01-10 NOTE — Telephone Encounter (Signed)
Returned the patient call and scheduled a follow up appt for 9/28

## 2021-01-13 NOTE — Progress Notes (Signed)
[facility] [address] [place]. [place] [address] [phone]       STROKE FOLLOW UP NOTE  [name] Date of Birth:  21-Sep-1950 Medical Record Number:  [id number]   Reason for Referral: neck pain and stroke follow up    CHIEF COMPLAINT:  Chief Complaint  Patient presents with   Follow-up    Rm 2 alone here for f/u post MRI results and wanted to discuss what treatment plan would be for neck paik/numbness.      HPI:  Today, 01/14/2021, Jordan Martinez returns for sooner follow-up visit unaccompanied to discuss imaging and further treatment options after prior visit with Dr. Leonie Man 1 month ago for intermittent right lower face and neck paresthesias that first presented around 10/2020. MR brain w/wo 12/29/2020 showed chronic left temporal and left insular ischemic infarcts but no evidence of acute findings. MR cervical 12/29/2020 degenerative changes with mild narrowing C4, C5 and C6 and severe left foraminal stenosis at C3-4 and severe bilateral foraminal stenosis C4-5.  She continues to experience symptoms without new characteristics or features or worsening.  Typically worse in the evening associated with increase neck pain and stiffness.  Denies any radiating symptoms into her arms.  She has remained on amitriptyline 25 mg nightly for migraine prophylaxis with occasional 10 mg dose as needed.  Stable from stroke standpoint without new or reoccurring stroke/TIA symptoms.  Compliant on aspirin and atorvastatin without associated side effects.  Blood pressure today 134/84.  Loop recorder negative for atrial fibrillation thus far.      History provided for reference purposes only Update 10/01/2020 JM: Jordan Martinez returns for stroke and headache 45-month follow-up accompanied by her friend, Jordan Martinez from a stroke standpoint without new or recurring stroke/TIA symptoms Reports compliance on aspirin and atorvastatin -denies associated side effects Blood pressure  today 135/83 Loop recorder has not shown atrial fibrillation thus far  Headaches have been stable only experiencing one migraine since prior visit. She took an additional amitriptyline 25 mg tablet with resolution.  She will experience very mild headaches approximately 2-3 times a week typically after dinner prior to schedule amitriptyline dosage but will quickly resolve after taking amitriptyline.  Percell Miller reports some difficulty with memory and anxiety She will have difficulty remembering numbers such as her address or cell phone password -this is been consistent since her stroke She feels as though her anxiety is greatly improving where she is not consistently fearful of having additional strokes and is sleeping better at night  She is being followed by pain clinic for b/l knee pain recently receiving COOLIEF therapy by Dr. Franchot Mimes but denies much benefit.  She has scheduled follow-up visit with him tomorrow for further discussion  No further concerns at this time  Update 04/01/2020 JM patient returns for stroke f/u. Stable from stroke standpoint since prior visit without residual deficits. Denies new stroke/TIA symptoms. Chronic headaches stable on amitriptyline $RemoveBeforeDE'50mg'[id number]$  nightly but does report morning grogginess.  Remains on aspirin $RemoveBe'81mg'vcgGJVTHU$  daily and atorvastatin without side effects. Blood pressure today 138/78. Loop recorder has not shown atrial fibrillation thus far.  She continues to be followed by pain management for bilateral knee osteoarthritis.  Does report occasional anxiety due to medical conditions but overall stable.  No further concerns at this time.  Update 11/29/2019 JM: Jordan Martinez is being seen for follow-up regarding left MCA stroke in 07/2018.  She has been doing well from a stroke standpoint without new or reoccurring stroke/TIA symptoms.  Underlying chronic  headaches worsened post stroke and initiated amitriptyline with eventual increase to $RemoveBef'50mg'GSwZkWiJFc$  with great benefit. Reports mild side  effects such as mild fatigue the following day and mild skin photosensitivity. She does endorse improvement of anxiety as well with use of amitriptyline. Continues on aspirin and atorvastatin for secondary stroke prevention.  Blood pressure today 118/62.  Loop recorder has not shown atrial fibrillation thus far.  No further concerns at this time.  Initial visit 08/23/2019 JM: Jordan Martinez is a 70 year old female who is being seen today for hospital follow up.  She is a retired Marine scientist.  Recovered well from a stroke standpoint without residual speech deficits.  Prior history of frontal headaches worsened with eyestrain and was told due to prior chemo treatments.  Headache intensity worsened post stroke gradually returning to baseline but continues to experience frequently.  At times she also experienced cloudiness but this has been unchanged.  She will be seeing her ophthalmologist next week for 1 year follow-up.  Describes headaches as a pressure sensation in frontal region and behind eyes bilaterally associated with photophobia and phonophobia but denies nausea/vomiting.  She will occasionally experience small flicker of light in her periphery prior to headache onset and is unsure if headaches are worse when she experiences visual aura.  She has not previously been treated for headaches or been diagnosed with migraine headaches.  She also endorses increased anxiety due to fear of recurrent stroke.  She has completed 3 weeks DAPT and continues on aspirin alone without bleeding or bruising.  Continues on atorvastatin 40 mg daily without myalgias.  Blood pressure today satisfactory at 130/70.  She has not had follow-up with IR as recommended post revascularization during admission.  Loop recorder is not shown atrial fibrillation thus far.  No further concerns at this time.  Hospital admission 07/17/2019: Jordan Martinez is a 70 y.o. female with history of diabetes, endometrial cancer,  HNPCC, hyperlipidemia, hypertension who presented on 07/17/2019 with aphasia.  Evaluated by stroke team and  Dr. Erlinda Hong with stroke work-up revealing left MCA infarct due to left M2 occlusion s/p TPA and IR with TICI 3 revascularization of distal left M2 with resultant small SAH left sylvian fissure, infarct embolic secondary to unknown source.  2D echo unremarkable.  LE venous Doppler negative.  Loop recorder placed to rule out A. fib as potential stroke etiology.  Recommended DAPT for 3 weeks and aspirin alone.  Initiated atorvastatin 40 mg daily with LDL 120.  HTN stable.  Controlled DM with A1c 7.0.  Current tobacco use with smoking cessation counseling provided.  Other stroke risk factors include advanced age, EtOH use and obesity but no prior history of stroke.  Discharged back to [facility] with recommendation of physical therapy.  Stroke: L MCA infarct due to left M2 occlusion s/p tPA and IR with resultant small SAH left sylvian fissure, infarct embolic secondary to unknown source Code Stroke CT head hyperdense L MCA. ASPECTS 10.    CTA head & neck L M2 MCA ELVO. L ICA 50% stenosis, mild L siphon plaque. Mild atherosclerosis throughout CT perfusion 7 mL core, 53 mL penumbra L MCA territory  Cerebral angio TICI3 revascularization distal L M2  F/u CT mild SAH L sylvian fissure. No infarct  MRI patchy small L MCA territory infarct (L insula, L frontal operculum, L temporal lobe). Small SAHD L sylvian fissure stable. MRA Unremarkable  2D Echo EF 65-70%. No source of embolus  LE venous doppler No DVT Loop recorder  placed 1/13 to look for AF as source of stroke ([place]) LDL 120 HgbA1c 7.0 aspirin 81 mg daily prior to admission, will start aspirin 81 and plavix 75 at d/c. Continue DAPT x 3 weeks then aspirin alone.   Therapy recommendations: continue OP PT  Disposition:  return to [facility]    ROS:   14 system review of systems performed and negative with exception of those listed in HPI  PMH:   Past Medical History:  Diagnosis Date   Anxiety    on meds   Arthritis    on PRN meds   Cataract    bilateral cat. ext. with lens implant- in [place]   Diabetes mellitus without complication ([place])    diet controlled. States always has borderline HgbA1C   Endometrial cancer ([place])    endometrial CA-2019   Family history of colon cancer    daughter 42 colon CA   Family history of genetic mutation for hereditary nonpolyposis colorectal cancer (HNPCC)    GERD (gastroesophageal reflux disease)    on meds   High cholesterol    on meds   History of kidney stones    years    Hypertension    on meds   Neuropathy    Sensation of pressure in bladder area    Stroke ([place]) 07/17/2019   Uterine cramping     PSH:  Past Surgical History:  Procedure Laterality Date   burnt the nerves  Right 04/29/2018   in right knee    burnt the nerves  Left 04/22/2018   left knee nerve    COLONOSCOPY  12/2018   [place]- 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   cystoscopy kidney stone   1985   HYSTERECTOMY ABDOMINAL WITH SALPINGO-OOPHORECTOMY  2019   IR CT HEAD LTD  07/18/2019   IR IMAGING GUIDED PORT INSERTION  06/15/2018   IR PERCUTANEOUS ART THROMBECTOMY/INFUSION INTRACRANIAL INC DIAG ANGIO  07/18/2019   IR REMOVAL TUN ACCESS W/ PORT W/O FL MOD SED  12/01/2018   LOOP RECORDER INSERTION N/A 07/19/2019   Procedure: LOOP RECORDER INSERTION;  Surgeon: Constance Haw, MD;  Location: [facility];  Service: Cardiovascular;  Laterality: N/A;   RADIOLOGY WITH ANESTHESIA N/A 07/17/2019   Procedure: IR WITH ANESTHESIA;  Surgeon: Luanne Bras, MD;  Location: [place];  Service: Radiology;  Laterality: N/A;   REPLACEMENT TOTAL KNEE BILATERAL Bilateral 2018   Right x 2. 2019 undergoing intermittent injection "RFA" procedures with Dr. Sheliah Mends [facility]   REVISION TOTAL KNEE ARTHROPLASTY Right 03/2017   ROBOTIC ASSISTED TOTAL HYSTERECTOMY WITH BILATERAL SALPINGO OOPHERECTOMY N/A 05/03/2018    Procedure: XI ROBOTIC ASSISTED TOTAL HYSTERECTOMY WITH BILATERAL SALPINGO OOPHORECTOMY;  Surgeon: Everitt Amber, MD;  Location: [facility];  Service: Gynecology;  Laterality: N/A;   SENTINEL NODE BIOPSY N/A 05/03/2018   Procedure: SENTINEL NODE BIOPSY;  Surgeon: Everitt Amber, MD;  Location: [facility];  Service: Gynecology;  Laterality: N/A;   TONSILLECTOMY AND ADENOIDECTOMY     TUBAL LIGATION      Social History:  Social History   Socioeconomic History   Marital status: Divorced    Spouse name: Not on file   Number of children: 2   Years of education: Not on file   Highest education level: Not on file  Occupational History   Occupation: retired Marine scientist  Tobacco Use   Smoking status: Every Day    Packs/day: 0.25    Years: 40.00    Pack years: 10.00    Types:  Cigarettes   Smokeless tobacco: Never   Tobacco comments:    4-5 loose cigarettes per day , over 20 year hx of smoking   Vaping Use   Vaping Use: Never used  Substance and Sexual Activity   Alcohol use: Yes    Alcohol/week: 14.0 standard drinks    Types: 14 Glasses of wine per week    Comment: about 2 glasses wine daily.    Drug use: Never   Sexual activity: Not Currently    Birth control/protection: None  Other Topics Concern   Not on file  Social History Narrative   Not on file   Social Determinants of Health   Financial Resource Strain: Not on file  Food Insecurity: Not on file  Transportation Needs: Not on file  Physical Activity: Not on file  Stress: Not on file  Social Connections: Not on file  Intimate Partner Violence: Not on file    Family History:  Family History  Adopted: Yes  Problem Relation Age of Onset   Colon cancer Daughter 60       Lynch syndrome - MSH6 +   Colon polyps Daughter 23   Healthy Son     Medications:   Current Outpatient Medications on File Prior to Visit  Medication Sig Dispense Refill   acetaminophen (TYLENOL) 500 MG tablet Take 1,000 mg by mouth once as needed for mild pain or  moderate pain.     amitriptyline (ELAVIL) 10 MG tablet Take 1 tablet (10 mg total) by mouth at bedtime as needed for sleep (migraine). 15 tablet 5   amitriptyline (ELAVIL) 25 MG tablet Take 1 tablet (25 mg total) by mouth at bedtime. 90 tablet 3   amoxicillin (AMOXIL) 500 MG capsule Take 2,000 mg by mouth once as needed (dental work).     aspirin EC 81 MG tablet Take 81 mg by mouth daily.     atenolol (TENORMIN) 50 MG tablet Take 75 mg by mouth daily.     atorvastatin (LIPITOR) 40 MG tablet Take 1 tablet (40 mg total) by mouth at bedtime. 30 tablet 2   Biotin 5000 MCG TABS Take 5,000 mcg by mouth daily.     Cholecalciferol (VITAMIN D3) 50 MCG (2000 UT) TABS Take 1 tablet by mouth daily in the afternoon.     cyclobenzaprine (FLEXERIL) 5 MG tablet Take 1 tablet (5 mg total) by mouth 3 (three) times daily. (Patient taking differently: Take 5-10 mg by mouth 3 (three) times daily as needed for muscle spasms.) 90 tablet 1   Lancets (ONETOUCH DELICA PLUS [id number]) MISC Apply topically daily.     lisinopril (ZESTRIL) 10 MG tablet Take 10 mg by mouth daily.     metFORMIN (GLUCOPHAGE) 500 MG tablet TAKE 1 TABLET BY MOUTH TWICE A DAY WITH MEALS 60 tablet 1   Omega-3 Fatty Acids (FISH OIL) 1200 MG CAPS Take 1,200 mg by mouth daily.      omeprazole (PRILOSEC) 40 MG capsule Take 1 capsule (40 mg total) by mouth daily. 90 capsule 3   Propylene Glycol 0.6 % SOLN Place 2 drops into both eyes 4 (four) times daily.     No current facility-administered medications on file prior to visit.    Allergies:   Allergies  Allergen Reactions   Amlodipine     Blurred vision    Compazine [Prochlorperazine Edisylate]     Stroke symptoms,   Gabapentin     Blurred vision and nystagmus   Lyrica [Pregabalin]     Blurred  Vision    Sertraline Other (See Comments)    Visual disturbances.    Chlorhexidine Rash    Blisters and redness to chest     Physical Exam  Vitals:   01/14/21 0757  BP: 134/82  Pulse: 69   Weight: 188 lb 4 oz (85.4 kg)  Height: 5\' 4"  (1.626 m)    Body mass index is 32.31 kg/m. No results found.  General: well developed, well nourished, pleasant middle-age Caucasian female, seated, in no evident distress Head: head normocephalic and atraumatic.   Neck: supple with no carotid or supraclavicular bruits Cardiovascular: regular rate and rhythm, no murmurs Musculoskeletal: full neck ROM slight increase stiffness when looking towards the right.  Tenderness upon palpation of the paraspinal muscles and trapezius Skin:  no rash/petichiae Vascular:  Normal pulses all extremities   Neurologic Exam Mental Status: Awake and fully alert. Fluent speech and language. Oriented to place and time. Recent  and remote memory intact. Attention span, concentration and fund of knowledge appropriate during visit. Mood and affect appropriate.  Cranial Nerves: Pupils equal, briskly reactive to light. Extraocular movements full without nystagmus. Visual fields full to confrontation. Hearing intact. Facial sensation intact. Face, tongue, palate moves normally and symmetrically.  Motor: Normal bulk and tone. Normal strength in all tested extremity muscles. Sensory.: intact to touch , pinprick , position and vibratory sensation.  Coordination: Rapid alternating movements normal in all extremities. Finger-to-nose and heel-to-shin performed accurately bilaterally. Gait and Station: Arises from chair without difficulty. Stance is normal. Gait demonstrates normal stride length and balance without use of assistive device Reflexes: 1+ and symmetric. Toes downgoing.        ASSESSMENT: Awa Bachicha is a 70 y.o. 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 Loop recorder placed.  Vascular risk factors include history of endometrial cancer with completion of chemo 2019, HTN,  HLD, DM, L ICA 50% stenosis.  Evaluated by Dr. Leonie Man 12/18/2020 for 41-month onset of intermittent right facial numbness and tingling     PLAN:  Face/neck paresthesias:  MR brain stable appearance of prior strokes and no new or worrisome findings MR cervical as above - findings likely not contributing to current symptoms Reviewed imaging in detail with patient and was reassured Unknown exact cause of symptoms but cervicalgia may possibly be contributing -refer to PT for dry needling, traction, strengthening and stretching exercises Continue amitriptyline 25 mg daily an additional 10 mg tablet as needed for increased symptoms (previously prescribed for migraine management but discussed possible benefit with paresthesias) Cryptogenic left MCA stroke :  Continue aspirin 81 mg daily  and atorvastatin 40 mg daily for secondary stroke prevention.   Loop recorder has not shown atrial fibrillation thus far. Will continued to be monitored.  HTN: BP goal 130/90.  Well-controlled on current regimen per PCP HLD: LDL goal<70. Continue atorvastatin 40mg  daily. DMII: A1c goal<7.0. managed by PCP  Chronic migraines post chemo:  Currently well controlled  Continue amitriptyline 25 mg nightly and use of amitriptyline 10 mg nightly as needed for breakthrough migraine  Left ICA stenosis: Asymptomatic.  Carotid duplex 04/2020 b/l ICA 1 to 39% stenosis. Plan on repeating after follow up visit for surveillance monitoring     Follow up in 2 months or call earlier if needed   CC:  [place] provider: Dr. Tona Sensing, Christian Mate, MD    I spent  46 minutes of face-to-face and non-face-to-face time with patient.  This included previsit chart review, lab review, study review, order entry, electronic health record documentation, patient education and discussion regarding further work-up of right facial and neck paresthesias and potential etiologies, review of prior imaging and further interventions, history of cryptogenic  stroke as well as secondary stroke prevention measures and aggressive stroke risk factor management, review of loop recorder, chronic migraines and ongoing management and answered all other questions to patient and friends satisfaction   Frann Rider, AGNP-BC  [facility] [address] [place], [place] [address]  Phone [phone] Fax [phone] Note: This document was prepared with digital dictation and possible smart phrase technology. Any transcriptional errors that result from this process are unintentional.

## 2021-01-14 ENCOUNTER — Telehealth: Payer: Self-pay | Admitting: Adult Health

## 2021-01-14 ENCOUNTER — Ambulatory Visit (INDEPENDENT_AMBULATORY_CARE_PROVIDER_SITE_OTHER): Payer: Medicare Other | Admitting: Adult Health

## 2021-01-14 ENCOUNTER — Encounter: Payer: Self-pay | Admitting: Adult Health

## 2021-01-14 VITALS — BP 134/82 | HR 69 | Ht 64.0 in | Wt 188.2 lb

## 2021-01-14 DIAGNOSIS — I6522 Occlusion and stenosis of left carotid artery: Secondary | ICD-10-CM

## 2021-01-14 DIAGNOSIS — G43709 Chronic migraine without aura, not intractable, without status migrainosus: Secondary | ICD-10-CM

## 2021-01-14 DIAGNOSIS — I639 Cerebral infarction, unspecified: Secondary | ICD-10-CM

## 2021-01-14 DIAGNOSIS — I10 Essential (primary) hypertension: Secondary | ICD-10-CM | POA: Diagnosis not present

## 2021-01-14 DIAGNOSIS — M542 Cervicalgia: Secondary | ICD-10-CM

## 2021-01-14 DIAGNOSIS — E119 Type 2 diabetes mellitus without complications: Secondary | ICD-10-CM

## 2021-01-14 DIAGNOSIS — R202 Paresthesia of skin: Secondary | ICD-10-CM | POA: Diagnosis not present

## 2021-01-14 DIAGNOSIS — E785 Hyperlipidemia, unspecified: Secondary | ICD-10-CM

## 2021-01-14 NOTE — Telephone Encounter (Signed)
Patient would like a call back regarding physical therapy order [phone]

## 2021-01-14 NOTE — Telephone Encounter (Signed)
Order placed prior to note completion.  Thank you.

## 2021-01-14 NOTE — Patient Instructions (Addendum)
Your Plan:  Referral will be placed to PT for neck pain that is likely contributing to your symptoms - will request performing dry needling, traction, strengthening and regular stretches.  Also use of moist heat can be beneficial  Continue amitriptyline 25 mg nightly and 10 mg nightly as needed - this extra dose could help with the nerve pain    Follow-up in 2 months or call earlier if needed     Thank you for coming to see [place] at [facility]. I hope we have been able to provide you high quality care today.  You may receive a patient satisfaction survey over the next few weeks. We would appreciate your feedback and comments so that we may continue to improve ourselves and the health of our patients.

## 2021-01-14 NOTE — Telephone Encounter (Signed)
Pt's called back and states she checked with her retirement facility and the physical therapy discussed during the visit today is not available. Pt is requesting referral be placed for physical therapy in the [facility] area that focuses on dry needling, traction, strengthening and regular stretches. I have put order in and have provider review for signature.

## 2021-01-14 NOTE — Progress Notes (Signed)
I agree with the above plan 

## 2021-01-22 ENCOUNTER — Other Ambulatory Visit: Payer: Self-pay

## 2021-01-22 ENCOUNTER — Ambulatory Visit: Payer: Medicare Other | Attending: Adult Health | Admitting: Physical Therapy

## 2021-01-22 ENCOUNTER — Encounter: Payer: Self-pay | Admitting: Physical Therapy

## 2021-01-22 DIAGNOSIS — M542 Cervicalgia: Secondary | ICD-10-CM | POA: Diagnosis present

## 2021-01-22 DIAGNOSIS — R29898 Other symptoms and signs involving the musculoskeletal system: Secondary | ICD-10-CM | POA: Diagnosis present

## 2021-01-22 DIAGNOSIS — R202 Paresthesia of skin: Secondary | ICD-10-CM | POA: Insufficient documentation

## 2021-01-22 DIAGNOSIS — R252 Cramp and spasm: Secondary | ICD-10-CM | POA: Insufficient documentation

## 2021-01-22 NOTE — Therapy (Signed)
[facility] [address]  [place], [place], [address] Phone: [phone]   Fax:  [phone]  Physical Therapy Evaluation  Patient Details  Name: Noel Henandez MRN: [id number] Date of Birth: 01/18/1951 Referring Provider (PT): Frann Rider   Encounter Date: 01/22/2021   PT End of Session - 01/22/21 1204     Visit Number 1    Number of Visits 12    Date for PT Re-Evaluation 03/05/21    Authorization Type Medicare A&B, [employer]    Progress Note Due on Visit 10    PT Start Time 1055    PT Stop Time 1155    PT Time Calculation (min) 60 min    Activity Tolerance Patient tolerated treatment well    Behavior During Therapy [facility] for tasks assessed/performed             Past Medical History:  Diagnosis Date   Anxiety    on meds   Arthritis    on PRN meds   Cataract    bilateral cat. ext. with lens implant- in [place]   Diabetes mellitus without complication ([place])    diet controlled. States always has borderline HgbA1C   Endometrial cancer ([place])    endometrial CA-2019   Family history of colon cancer    daughter 52 colon CA   Family history of genetic mutation for hereditary nonpolyposis colorectal cancer (HNPCC)    GERD (gastroesophageal reflux disease)    on meds   High cholesterol    on meds   History of kidney stones    years    Hypertension    on meds   Neuropathy    Sensation of pressure in bladder area    Stroke ([place]) 07/17/2019   Uterine cramping     Past Surgical History:  Procedure Laterality Date   burnt the nerves  Right 04/29/2018   in right knee    burnt the nerves  Left 04/22/2018   left knee nerve    COLONOSCOPY  12/2018   [place]- 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   cystoscopy kidney stone   1985   HYSTERECTOMY ABDOMINAL WITH SALPINGO-OOPHORECTOMY  2019   IR CT HEAD LTD  07/18/2019   IR IMAGING GUIDED PORT INSERTION  06/15/2018   IR PERCUTANEOUS ART THROMBECTOMY/INFUSION  INTRACRANIAL INC DIAG ANGIO  07/18/2019   IR REMOVAL TUN ACCESS W/ PORT W/O FL MOD SED  12/01/2018   LOOP RECORDER INSERTION N/A 07/19/2019   Procedure: LOOP RECORDER INSERTION;  Surgeon: Constance Haw, MD;  Location: [facility];  Service: Cardiovascular;  Laterality: N/A;   RADIOLOGY WITH ANESTHESIA N/A 07/17/2019   Procedure: IR WITH ANESTHESIA;  Surgeon: Luanne Bras, MD;  Location: [facility];  Service: Radiology;  Laterality: N/A;   REPLACEMENT TOTAL KNEE BILATERAL Bilateral 2018   Right x 2. 2019 undergoing intermittent injection "RFA" procedures with Dr. Sheliah Mends [facility]   REVISION TOTAL KNEE ARTHROPLASTY Right 03/2017   ROBOTIC ASSISTED TOTAL HYSTERECTOMY WITH BILATERAL SALPINGO OOPHERECTOMY N/A 05/03/2018   Procedure: XI ROBOTIC ASSISTED TOTAL HYSTERECTOMY WITH BILATERAL SALPINGO OOPHORECTOMY;  Surgeon: Everitt Amber, MD;  Location: [facility];  Service: Gynecology;  Laterality: N/A;   SENTINEL NODE BIOPSY N/A 05/03/2018   Procedure: SENTINEL NODE BIOPSY;  Surgeon: Everitt Amber, MD;  Location: [facility];  Service: Gynecology;  Laterality: N/A;   TONSILLECTOMY AND ADENOIDECTOMY     TUBAL LIGATION      There were no vitals filed for  this visit.    Subjective Assessment - 01/22/21 1052     Subjective Pt reports neck pain started in April, but then out of no where the tingling started on R side of face, sometimes into ear, sometimes side of face, sometimes down neck and R shoulder.    Pertinent History history of cancer, chemotherapy,    Limitations Reading   sleeping   How long can you sit comfortably? 30 - rolls blanket behind neck    How long can you stand comfortably? no limitations    How long can you walk comfortably? no difficulty    Diagnostic tests MR brain w/wo 12/29/2020 showed chronic left temporal and left insular ischemic infarcts but no evidence of acute findings. 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    Patient Stated Goals no more neck pain and no more tingling    Currently in Pain? Yes    Pain Score 5    increases to 8-9/10 in evening   Pain Location Neck    Pain Orientation Right    Pain Descriptors / Indicators Tingling;Aching;Headache;Throbbing    Pain Type Acute pain    Pain Radiating Towards R shoulder    Pain Onset Today    Pain Frequency Constant    Aggravating Factors  prolonged sitting, fatigue, looking up, trying to lay on belly to read or watch TV    Pain Relieving Factors medications (amytriptiline for headaches, extra strength tylenol)    Effect of Pain on Daily Activities having to cancel travel plans, very uncomfortable end of day, can't get into comfortable position                [facility] PT Assessment - 01/22/21 0001       Assessment   Medical Diagnosis R20.2 Tingling sensation in face, M54.2 Cervicalgia    Referring Provider (PT) Frann Rider    Onset Date/Surgical Date 10/04/20    Hand Dominance Right    Next MD Visit 03/18/2021    Prior Therapy none for this condition      Precautions   Precautions Other (comment)   loop recorder   Precaution Comments history of cancer      Balance Screen   Has the patient fallen in the past 6 months Yes    How many times? 1   uneven curb   Has the patient had a decrease in activity level because of a fear of falling?  No    Is the patient reluctant to leave their home because of a fear of falling?  No      Home Environment   Living Environment Private residence    Living Arrangements Alone    Type of [place] Access Level entry    Home Layout One level    [place] seat;Grab bars - tub/shower;Grab bars - toilet      Prior Function   Level of Independence Independent    Leisure reading, exercise at Y 3x/week      Cognition   Overall Cognitive Status Within Functional Limits for tasks assessed    Problem  Solving Impaired    Problem Solving Impairment Other (comment)   difficulty remembering numbers     Observation/Other Assessments   Focus on Therapeutic Outcomes (FOTO)  52   Cervical Form     Sensation   Light Touch Appears Intact  Hot/Cold Appears Intact      Coordination   Gross Motor Movements are Fluid and Coordinated Yes    Finger Nose Finger Test able to perform      Posture/Postural Control   Posture/Postural Control Postural limitations    Postural Limitations Rounded Shoulders;Forward head      ROM / Strength   AROM / PROM / Strength AROM;PROM;Strength      AROM   Overall AROM Comments very painful guarded movements all directions    AROM Assessment Site Cervical    Cervical Flexion 30 aches    Cervical Extension 25 pain    Cervical - Right Side Bend 20 ache    Cervical - Left Side Bend 20 ache    Cervical - Right Rotation 35 winces    Cervical - Left Rotation 50      Strength   Overall Strength Within functional limits for tasks performed    Overall Strength Comments 4+5 throughout bil UE myotomes    Strength Assessment Site Shoulder    Right/Left Shoulder Right;Left      Palpation   Spinal mobility hypomobility in cervical spine, tenderness throughout cervical paraspinals and suboccipitals R>L      Special Tests    Special Tests Cervical      Spurling's   Findings Negative    Side Right      Distraction Test   Findngs Positive    side Right    Comment decreased symptoms                        Objective measurements completed on examination: See above findings.               PT Education - 01/22/21 1204     Education Details Educated on findings and plan of care, also educated on dry needling including risk factors.    Person(s) Educated Patient    Methods Explanation    Comprehension Verbalized understanding              PT Short Term Goals - 01/22/21 1205       PT SHORT TERM GOAL #1   Title Pt. will be  compliant with initial HEP to improve cervical ROM to decrease neck pain    Time 2    Period Weeks    Status New    Target Date 02/05/21               PT Long Term Goals - 01/22/21 1206       PT LONG TERM GOAL #1   Title Pt. will report 50% improvement in occurance of tingling in R side of neck/face/shoulder    Baseline occurs intermittantly thorughout the day, 3x during evaluation    Time 6    Period Weeks    Status New    Target Date 03/05/21      PT LONG TERM GOAL #2   Title Pt. will report no more than 3/10 neck pain at rest at end of day.    Baseline 5/10 neck pain in morning increasing to 9/10 by end of day    Time 6    Period Weeks    Status New    Target Date 03/05/21      PT LONG TERM GOAL #3   Title Pt. will demonstrate improved cervical ROM by at least 15 deg all directions to perform ADLs    Baseline limited cervical flexion/extension/sidbending to 20 deg, R rotation only  35 deg, L rotation 50 deg, pain with all movements.    Time 6    Period Weeks    Status New    Target Date 03/05/21      PT LONG TERM GOAL #4   Title Pt. will be able to lay on stomach to read for 15 minutes without increased neck pain.    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  She has been seen by neurology and had MRI, which demonstrates some stenosis in cervical spine, but unknown cause of tingling, so referred by neurology to PT to see if symptoms would improve if cervicalgia was addressed.  She demonstrates significant deficits in cervical ROM with increased pain with all neck movements, as well as tightness in R UT, cervical paraspinals and suboccipitals.  She is a good candidate for trial of  dry needling and was educated today on risk factors.  She would benefit from skilled physical therapy to improve cervical ROM, decrease pain, and improve ability to perform leisure activities like reading and traveling, as well as tolerance to sleeping and sitting.    Personal Factors and Comorbidities Age;Comorbidity 3+    Comorbidities history of CVA, cancer, chemotherapy and deconditioning, arthritis, bil TKR, smoker    Examination-Activity Limitations Bend;Lift;Sleep;Sit;Hygiene/Grooming    Examination-Participation Restrictions Interpersonal Relationship;Driving;Community Activity;Other    Stability/Clinical Decision Making Unstable/Unpredictable    Clinical Decision Making High    Rehab Potential Good    PT Frequency 2x / week    PT Duration 6 weeks    PT Treatment/Interventions Cryotherapy;Moist Heat;Traction;Therapeutic activities;Therapeutic exercise;Neuromuscular re-education;Manual techniques;Passive range of motion;Dry needling;Taping;Spinal Manipulations;Joint Manipulations    PT Next Visit Plan initiate HEP for cervical ROM, dry needling/manual therapy for pain    Consulted and Agree with Plan of Care Patient             Patient will benefit from skilled therapeutic intervention in order to improve the following deficits and impairments:  Hypomobility, Increased muscle spasms, Decreased range of motion, Decreased activity tolerance, Decreased strength, Impaired flexibility, Pain, Postural dysfunction  Visit Diagnosis: Cervicalgia  Cramp and spasm  Other symptoms and signs involving the musculoskeletal system  Tingling of face     Problem List Patient Active Problem List   Diagnosis Date Noted   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   Middle cerebral artery embolism, 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   SAH (subarachnoid hemorrhage) (HCC)    Mixed hyperlipidemia    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, embolic 07/17/2019   Yeast infection 11/15/2018   Preventive  measure 10/28/2018   Gout attack 08/08/2018   Dysuria 08/01/2018   Elevated liver enzymes 08/01/2018   Chemotherapy-induced nausea 07/14/2018   Other constipation 07/14/2018   Physical debility 07/08/2018   Port-A-Cath in place 07/01/2018   Essential hypertension 07/01/2018   MSH6-related Lynch syndrome (HNPCC5) 07/01/2018   Arthritis 07/01/2018   Leukopenia due to antineoplastic chemotherapy (HCC) 06/26/2018   Diarrhea 06/26/2018   ARF (acute renal failure) (HCC) 06/25/2018   Diabetes mellitus without complication (HCC) 06/09/2018  Genetic testing 05/30/2018   Family history of colon cancer    Family history of genetic mutation for hereditary nonpolyposis colorectal cancer (HNPCC)    Endometrial cancer ([place]) 05/03/2018    Rennie Natter PT, DPT 01/22/2021, 12:18 PM  [facility] [address]  [place], [place], [address] Phone: [phone]   Fax:  [phone]  Name: Taite Baldassari MRN: [id number] Date of Birth: 11-Sep-1950

## 2021-01-24 ENCOUNTER — Other Ambulatory Visit: Payer: Self-pay

## 2021-01-24 ENCOUNTER — Encounter: Payer: Self-pay | Admitting: Physical Therapy

## 2021-01-24 ENCOUNTER — Ambulatory Visit: Payer: Medicare Other | Admitting: Physical Therapy

## 2021-01-24 DIAGNOSIS — R29898 Other symptoms and signs involving the musculoskeletal system: Secondary | ICD-10-CM

## 2021-01-24 DIAGNOSIS — R202 Paresthesia of skin: Secondary | ICD-10-CM

## 2021-01-24 DIAGNOSIS — M542 Cervicalgia: Secondary | ICD-10-CM | POA: Diagnosis not present

## 2021-01-24 DIAGNOSIS — R252 Cramp and spasm: Secondary | ICD-10-CM

## 2021-01-24 NOTE — Progress Notes (Signed)
Carelink Summary Report / Loop Recorder 

## 2021-01-24 NOTE — Patient Instructions (Signed)
Trigger Point Dry Needling  What is Trigger Point Dry Needling (DN)? DN is a physical therapy technique used to treat muscle pain and dysfunction. Specifically, DN helps deactivate muscle trigger points (muscle knots).  A thin filiform needle is used to penetrate the skin and stimulate the underlying trigger point. The goal is for a local twitch response (LTR) to occur and for the trigger point to relax. No medication of any kind is injected during the procedure.   What Does Trigger Point Dry Needling Feel Like?  The procedure feels different for each individual patient. Some patients report that they do not actually feel the needle enter the skin and overall the process is not painful. Very mild bleeding may occur. However, many patients feel a deep cramping in the muscle in which the needle was inserted. This is the local twitch response.   How Will I feel after the treatment? Soreness is normal, and the onset of soreness may not occur for a few hours. Typically this soreness does not last longer than two days.  Bruising is uncommon, however; ice can be used to decrease any possible bruising.  In rare cases feeling tired or nauseous after the treatment is normal. In addition, your symptoms may get worse before they get better, this period will typically not last longer than 24 hours.   What Can I do After My Treatment? Increase your hydration by drinking more water for the next 24 hours. You may place ice or heat on the areas treated that have become sore, however, do not use heat on inflamed or bruised areas. Heat often brings more relief post needling. You can continue your regular activities, but vigorous activity is not recommended initially after the treatment for 24 hours. DN is best combined with other physical therapy such as strengthening, stretching, and other therapies.     Access Code: [email or web address] URL: [email or web address] Date: 01/24/2021 Prepared by: Glenetta Hew  Exercises Cervical Extension AROM with Strap - 2 x daily - 7 x weekly - 10 reps Seated Assisted Cervical Rotation with Towel - 2 x daily - 7 x weekly - 10 reps Mid-Lower Cervical Sidebend SNAG - 2 x daily - 7 x weekly - 10 reps Seated Cervical Retraction - 2 x daily - 7 x weekly - 10 reps - 5 hold

## 2021-01-24 NOTE — Therapy (Signed)
[facility] [address]  [place], [place], [address] Phone: [phone]   Fax:  [phone]  Physical Therapy Treatment  Patient Details  Name: Stephanine Reas MRN: [id number] Date of Birth: 08-11-1950 Referring Provider (PT): Frann Rider   Encounter Date: 01/24/2021   PT End of Session - 01/24/21 1130     Visit Number 2    Number of Visits 12    Date for PT Re-Evaluation 03/05/21    Authorization Type Medicare A&B, Aetna    Progress Note Due on Visit 10    PT Start Time 0845    PT Stop Time 0935    PT Time Calculation (min) 50 min    Activity Tolerance Patient tolerated treatment well    Behavior During Therapy [facility] for tasks assessed/performed             Past Medical History:  Diagnosis Date   Anxiety    on meds   Arthritis    on PRN meds   Cataract    bilateral cat. ext. with lens implant- in [place]   Diabetes mellitus without complication ([place])    diet controlled. States always has borderline HgbA1C   Endometrial cancer ([place])    endometrial CA-2019   Family history of colon cancer    daughter 43 colon CA   Family history of genetic mutation for hereditary nonpolyposis colorectal cancer (HNPCC)    GERD (gastroesophageal reflux disease)    on meds   High cholesterol    on meds   History of kidney stones    years    Hypertension    on meds   Neuropathy    Sensation of pressure in bladder area    Stroke ([place]) 07/17/2019   Uterine cramping     Past Surgical History:  Procedure Laterality Date   burnt the nerves  Right 04/29/2018   in right knee    burnt the nerves  Left 04/22/2018   left knee nerve    COLONOSCOPY  12/2018   [place]- 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   cystoscopy kidney stone   1985   HYSTERECTOMY ABDOMINAL WITH SALPINGO-OOPHORECTOMY  2019   IR CT HEAD LTD  07/18/2019   IR IMAGING GUIDED PORT INSERTION  06/15/2018   IR PERCUTANEOUS ART THROMBECTOMY/INFUSION  INTRACRANIAL INC DIAG ANGIO  07/18/2019   IR REMOVAL TUN ACCESS W/ PORT W/O FL MOD SED  12/01/2018   LOOP RECORDER INSERTION N/A 07/19/2019   Procedure: LOOP RECORDER INSERTION;  Surgeon: Constance Haw, MD;  Location: [facility];  Service: Cardiovascular;  Laterality: N/A;   RADIOLOGY WITH ANESTHESIA N/A 07/17/2019   Procedure: IR WITH ANESTHESIA;  Surgeon: Luanne Bras, MD;  Location: [facility];  Service: Radiology;  Laterality: N/A;   REPLACEMENT TOTAL KNEE BILATERAL Bilateral 2018   Right x 2. 2019 undergoing intermittent injection "RFA" procedures with Dr. Sheliah Mends [facility]   REVISION TOTAL KNEE ARTHROPLASTY Right 03/2017   ROBOTIC ASSISTED TOTAL HYSTERECTOMY WITH BILATERAL SALPINGO OOPHERECTOMY N/A 05/03/2018   Procedure: XI ROBOTIC ASSISTED TOTAL HYSTERECTOMY WITH BILATERAL SALPINGO OOPHORECTOMY;  Surgeon: Everitt Amber, MD;  Location: [facility];  Service: Gynecology;  Laterality: N/A;   SENTINEL NODE BIOPSY N/A 05/03/2018   Procedure: SENTINEL NODE BIOPSY;  Surgeon: Everitt Amber, MD;  [facility]: [facility];  Service: Gynecology;  Laterality: N/A;   TONSILLECTOMY AND ADENOIDECTOMY     TUBAL LIGATION      There were no vitals filed for  this visit.   Subjective Assessment - 01/24/21 0848     Subjective Pt reports neck pain is the same.  She did go to fitness gym yesterday.  She does the machines (arms/chest/legs) and elliptical.    Pertinent History history of cancer, chemotherapy,    Limitations Reading   sleeping   How long can you sit comfortably? 30 - rolls blanket behind neck    How long can you stand comfortably? no limitations    How long can you walk comfortably? no difficulty    Diagnostic tests MR brain w/wo 12/29/2020 showed chronic left temporal and left insular ischemic infarcts but no evidence of acute findings. MR cervical 12/29/2020 degenerative changes with mild narrowing C4, C5 and C6 and severe left foraminal stenosis at C3-4 and severe bilateral foraminal  stenosis C4-5.    Patient Stated Goals no more neck pain and no more tingling    Currently in Pain? No/denies    Pain Location Neck    Pain Orientation Right    Pain Onset Today                               [facility] Adult PT Treatment/Exercise - 01/24/21 0001       Exercises   Exercises Neck      Neck Exercises: Machines for Strengthening   UBE (Upper Arm Bike) L1 x 6 min (3 min foward/3 min retro)      Neck Exercises: Seated   Postural Training chin tucks x  10    Other Seated Exercise self snags with pillow case - extension, rotation, and sidebending.  Demo, VC, tactile cues.      Manual Therapy   Manual Therapy Soft tissue mobilization;Manual Traction    Soft tissue mobilization STM to cervical paraspinals, very tender    Manual Traction to cervical region, reported decreased pain              Trigger Point Dry Needling - 01/24/21 0001     Consent Given? Yes    Education Handout Provided Yes    Muscles Treated Head and Neck Oblique capitus;Upper trapezius;Cervical multifidi    Upper Trapezius Response Twitch reponse elicited;Palpable increased muscle length    Oblique Capitus Response Twitch response elicited;Palpable increased muscle length    Cervical multifidi Response Twitch reponse elicited                    PT Short Term Goals - 01/22/21 1205       PT SHORT TERM GOAL #1   Title Pt. will be compliant with initial HEP to improve cervical ROM to decrease neck pain    Time 2    Period Weeks    Status New    Target Date 02/05/21               PT Long Term Goals - 01/22/21 1206       PT LONG TERM GOAL #1   Title Pt. will report 50% improvement in occurance of tingling in R side of neck/face/shoulder    Baseline occurs intermittantly thorughout the day, 3x during evaluation    Time 6    Period Weeks    Status New    Target Date 03/05/21      PT LONG TERM GOAL #2   Title Pt. will report no more than 3/10 neck pain  at rest at end of day.    Baseline 5/10 neck  pain in morning increasing to 9/10 by end of day    Time 6    Period Weeks    Status New    Target Date 03/05/21      PT LONG TERM GOAL #3   Title Pt. will demonstrate improved cervical ROM by at least 15 deg all directions to perform ADLs    Baseline limited cervical flexion/extension/sidbending to 20 deg, R rotation only 35 deg, L rotation 50 deg, pain with all movements.    Time 6    Period Weeks    Status New    Target Date 03/05/21      PT LONG TERM GOAL #4   Title Pt. will be able to lay on stomach to read for 15 minutes without increased neck pain.    Baseline unable to read in this position, favorite position    Time 6    Period Weeks    Status New    Target Date 03/05/21                   Plan - 01/24/21 1131     Clinical Impression Statement Pt. participated in therapeutic exercise today focusing on improving cervical ROM, noted extremely guarded with all movements although reported no increased pain with exercises.  She was again educated on all risk factors with dry needling and consented to intervention.  She tolerated dry needling well, no bleeding noted but did note "knot" over R cervical multifus at C5/6 region, applied gentle pressure to area which decreased.  Given written handout on DN as well as HEP for exercises performed today.    Personal Factors and Comorbidities Age;Comorbidity 3+    Comorbidities history of CVA, cancer, chemotherapy and deconditioning, arthritis, bil TKR, smoker    Examination-Activity Limitations Bend;Lift;Sleep;Sit;Hygiene/Grooming    Examination-Participation Restrictions Interpersonal Relationship;Driving;Community Activity;Other    Stability/Clinical Decision Making Unstable/Unpredictable    Rehab Potential Good    PT Frequency 2x / week    PT Duration 6 weeks    PT Treatment/Interventions Cryotherapy;Moist Heat;Traction;Therapeutic activities;Therapeutic exercise;Neuromuscular  re-education;Manual techniques;Passive range of motion;Dry needling;Taping;Spinal Manipulations;Joint Manipulations    PT Next Visit Plan continue to progress cervical ROM, manual therapy/modalities PRN    PT Home Exercise Plan [email or web address]    Consulted and Agree with Plan of Care Patient             Patient will benefit from skilled therapeutic intervention in order to improve the following deficits and impairments:  Hypomobility, Increased muscle spasms, Decreased range of motion, Decreased activity tolerance, Decreased strength, Impaired flexibility, Pain, Postural dysfunction  Visit Diagnosis: Cervicalgia  Cramp and spasm  Other symptoms and signs involving the musculoskeletal system  Tingling of face     Problem List Patient Active Problem List   Diagnosis Date Noted   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   Middle cerebral artery embolism, 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   SAH (subarachnoid hemorrhage) ([place])    Mixed hyperlipidemia    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, embolic 83/15/1761   Yeast infection 11/15/2018   Preventive measure 10/28/2018   Gout attack 08/08/2018   Dysuria 08/01/2018   Elevated liver enzymes 08/01/2018   Chemotherapy-induced nausea 07/14/2018   Other constipation 07/14/2018   Physical debility 07/08/2018   Port-A-Cath in place 07/01/2018   Essential hypertension 07/01/2018   MSH6-related Lynch syndrome (HNPCC5) 07/01/2018   Arthritis 07/01/2018   Leukopenia due to antineoplastic chemotherapy ([place]) 06/26/2018   Diarrhea 06/26/2018   ARF (acute renal failure) (  [place]) 06/25/2018   Diabetes mellitus without complication ([place]) 08/26/2667   Genetic testing 05/30/2018   Family history of colon cancer    Family history of genetic mutation for hereditary nonpolyposis colorectal cancer (HNPCC)    Endometrial cancer ([place]) 05/03/2018    Rennie Natter PT, DPT 01/24/2021, 11:38 AM  [facility] [address]  [place], [place], [address] Phone: [phone]   Fax:  [phone]  Name: Cheryle Dark MRN: [id number] Date of Birth: [birth year]

## 2021-01-27 ENCOUNTER — Ambulatory Visit: Payer: Medicare Other | Admitting: Physical Therapy

## 2021-01-27 ENCOUNTER — Encounter: Payer: Self-pay | Admitting: Physical Therapy

## 2021-01-27 ENCOUNTER — Other Ambulatory Visit: Payer: Self-pay

## 2021-01-27 DIAGNOSIS — M542 Cervicalgia: Secondary | ICD-10-CM

## 2021-01-27 DIAGNOSIS — R29898 Other symptoms and signs involving the musculoskeletal system: Secondary | ICD-10-CM

## 2021-01-27 DIAGNOSIS — R202 Paresthesia of skin: Secondary | ICD-10-CM

## 2021-01-27 DIAGNOSIS — R252 Cramp and spasm: Secondary | ICD-10-CM

## 2021-01-27 NOTE — Therapy (Signed)
[facility] [address]  [place], [place], [address] Phone: [phone]   Fax:  [phone]  Physical Therapy Treatment  Patient Details  Name: Jordan Martinez MRN: [id number] Date of Birth: [birth year] Referring Provider (PT): Frann Rider   Encounter Date: 01/27/2021   PT End of Session - 01/27/21 1504     Visit Number 3    Number of Visits 12    Date for PT Re-Evaluation 03/05/21    Authorization Type Medicare A&B, Aetna    Progress Note Due on Visit 10    PT Start Time 1315    PT Stop Time 1355    PT Time Calculation (min) 40 min    Activity Tolerance Patient tolerated treatment well    Behavior During Therapy [facility] for tasks assessed/performed             Past Medical History:  Diagnosis Date   Anxiety    on meds   Arthritis    on PRN meds   Cataract    bilateral cat. ext. with lens implant- in [place]   Diabetes mellitus without complication ([place])    diet controlled. States always has borderline HgbA1C   Endometrial cancer ([place])    endometrial CA-2019   Family history of colon cancer    daughter 4 colon CA   Family history of genetic mutation for hereditary nonpolyposis colorectal cancer (HNPCC)    GERD (gastroesophageal reflux disease)    on meds   High cholesterol    on meds   History of kidney stones    years    Hypertension    on meds   Neuropathy    Sensation of pressure in bladder area    Stroke ([place]) 07/17/2019   Uterine cramping     Past Surgical History:  Procedure Laterality Date   burnt the nerves  Right 04/29/2018   in right knee    burnt the nerves  Left 04/22/2018   left knee nerve    COLONOSCOPY  12/2018   [place]- 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   cystoscopy kidney stone   1985   HYSTERECTOMY ABDOMINAL WITH SALPINGO-OOPHORECTOMY  2019   IR CT HEAD LTD  07/18/2019   IR IMAGING GUIDED PORT INSERTION  06/15/2018   IR PERCUTANEOUS ART THROMBECTOMY/INFUSION  INTRACRANIAL INC DIAG ANGIO  07/18/2019   IR REMOVAL TUN ACCESS W/ PORT W/O FL MOD SED  12/01/2018   LOOP RECORDER INSERTION N/A 07/19/2019   Procedure: LOOP RECORDER INSERTION;  Surgeon: Constance Haw, MD;  Location: [facility];  Service: Cardiovascular;  Laterality: N/A;   RADIOLOGY WITH ANESTHESIA N/A 07/17/2019   Procedure: IR WITH ANESTHESIA;  Surgeon: Luanne Bras, MD;  Location: [facility];  Service: Radiology;  Laterality: N/A;   REPLACEMENT TOTAL KNEE BILATERAL Bilateral 2018   Right x 2. 2019 undergoing intermittent injection "RFA" procedures with Dr. Sheliah Mends [facility]   REVISION TOTAL KNEE ARTHROPLASTY Right 03/2017   ROBOTIC ASSISTED TOTAL HYSTERECTOMY WITH BILATERAL SALPINGO OOPHERECTOMY N/A 05/03/2018   Procedure: XI ROBOTIC ASSISTED TOTAL HYSTERECTOMY WITH BILATERAL SALPINGO OOPHORECTOMY;  Surgeon: Everitt Amber, MD;  [facility];  Service: Gynecology;  Laterality: N/A;   SENTINEL NODE BIOPSY N/A 05/03/2018   Procedure: SENTINEL NODE BIOPSY;  Surgeon: Everitt Amber, MD;  [facility];  Service: Gynecology;  Laterality: N/A;   TONSILLECTOMY AND ADENOIDECTOMY     TUBAL LIGATION      There were no vitals filed for  this visit.   Subjective Assessment - 01/27/21 1318     Subjective Pt. reports she had no soreness following DN last session.  She even went to the fitness gym afterwards.  She feels her pain is better.   Tingling is even a little bit better.  No headache today.   Pertinent History history of cancer, chemotherapy,    Limitations Reading   sleeping   How long can you sit comfortably? 30 - rolls blanket behind neck    How long can you stand comfortably? no limitations    How long can you walk comfortably? no difficulty    Diagnostic tests MR brain w/wo 12/29/2020 showed chronic left temporal and left insular ischemic infarcts but no evidence of acute findings. MR cervical 12/29/2020 degenerative changes with mild narrowing C4, C5 and C6 and severe  left foraminal stenosis at C3-4 and severe bilateral foraminal stenosis C4-5.    Patient Stated Goals no more neck pain and no more tingling    Currently in Pain? Yes    Pain Score 3     Pain Location Neck    Pain Orientation Right    Pain Onset Today                               [facility] Adult PT Treatment/Exercise - 01/27/21 0001       Exercises   Exercises Neck      Neck Exercises: Machines for Strengthening   UBE (Upper Arm Bike) L1 x 6 min (3 min foward/3 min retro)      Neck Exercises: Standing   Neck Retraction 10 reps    Neck Retraction Limitations first with head on ball, needed tactile cues.  Improved when switched to rolled towel but still needed VC    Wall Push Ups 20 reps    Wall Push Ups Limitations cues and demo    Other Standing Exercises wall angels x 10      Neck Exercises: Seated   Cervical Rotation 5 reps;Both    Cervical Rotation Limitations noted very guarded still    Shoulder Rolls Backwards;10 reps    Shoulder Rolls Limitations multiple sets throughout session      Manual Therapy   Manual Therapy Soft tissue mobilization;Manual Traction    Soft tissue mobilization STM to cervical paraspinals, very tender    Manual Traction to cervical region, reported decreased pain                    PT Education - 01/27/21 1504     Education Details continue to perform HEP and cervical AROM frequently    Person(s) Educated Patient    Methods Explanation;Demonstration    Comprehension Verbalized understanding              PT Short Term Goals - 01/22/21 1205       PT SHORT TERM GOAL #1   Title Pt. will be compliant with initial HEP to improve cervical ROM to decrease neck pain    Time 2    Period Weeks    Status New    Target Date 02/05/21               PT Long Term Goals - 01/22/21 1206       PT LONG TERM GOAL #1   Title Pt. will report 50% improvement in occurance of tingling in R side of neck/face/shoulder     Baseline occurs  intermittantly thorughout the day, 3x during evaluation    Time 6    Period Weeks    Status New    Target Date 03/05/21      PT LONG TERM GOAL #2   Title Pt. will report no more than 3/10 neck pain at rest at end of day.    Baseline 5/10 neck pain in morning increasing to 9/10 by end of day    Time 6    Period Weeks    Status New    Target Date 03/05/21      PT LONG TERM GOAL #3   Title Pt. will demonstrate improved cervical ROM by at least 15 deg all directions to perform ADLs    Baseline limited cervical flexion/extension/sidbending to 20 deg, R rotation only 35 deg, L rotation 50 deg, pain with all movements.    Time 6    Period Weeks    Status New    Target Date 03/05/21      PT LONG TERM GOAL #4   Title Pt. will be able to lay on stomach to read for 15 minutes without increased neck pain.    Baseline unable to read in this position, favorite position    Time 6    Period Weeks    Status New    Target Date 03/05/21                   Plan - 01/27/21 1505     Clinical Impression Statement Pt. reported very positive response to last session - she has had much less pain overall with neck movements and has been able to perform HEP, although still finds chin tucks very challenging.  She had no soreness following DN.  Discussed DN and in agreement with trying again next session, as still has palpable "knot" in R cervical multifidus from last session.  She is still very guarded with neck movements but after manual therapy including manual cervical traction reported improved AROM and less "creaking" in her neck.  She would benefit from skilled physical therapy to decrease pain and improve cevical ROM.    Personal Factors and Comorbidities Age;Comorbidity 3+    Comorbidities history of CVA, cancer, chemotherapy and deconditioning, arthritis, bil TKR, smoker    Examination-Activity Limitations Bend;Lift;Sleep;Sit;Hygiene/Grooming    Examination-Participation  Restrictions Interpersonal Relationship;Driving;Community Activity;Other    Stability/Clinical Decision Making Unstable/Unpredictable    Rehab Potential Good    PT Frequency 2x / week    PT Duration 6 weeks    PT Treatment/Interventions Cryotherapy;Moist Heat;Traction;Therapeutic activities;Therapeutic exercise;Neuromuscular re-education;Manual techniques;Passive range of motion;Dry needling;Taping;Spinal Manipulations;Joint Manipulations    PT Next Visit Plan continue to progress cervical ROM, postural strengthening - add TB exercises,  manual therapy/modalities PRN    PT Home Exercise Plan [email or web address]    Consulted and Agree with Plan of Care Patient             Patient will benefit from skilled therapeutic intervention in order to improve the following deficits and impairments:  Hypomobility, Increased muscle spasms, Decreased range of motion, Decreased activity tolerance, Decreased strength, Impaired flexibility, Pain, Postural dysfunction  Visit Diagnosis: Cervicalgia  Cramp and spasm  Other symptoms and signs involving the musculoskeletal system  Tingling of face     Problem List Patient Active Problem List   Diagnosis Date Noted   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   Middle cerebral artery embolism, 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   SAH (subarachnoid hemorrhage) ([place])    Mixed hyperlipidemia    Cryptogenic  Stroke ([place]) L MCA infarct s/p tPA and IR, embolic 31/59/4585   Yeast infection 11/15/2018   Preventive measure 10/28/2018   Gout attack 08/08/2018   Dysuria 08/01/2018   Elevated liver enzymes 08/01/2018   Chemotherapy-induced nausea 07/14/2018   Other constipation 07/14/2018   Physical debility 07/08/2018   Port-A-Cath in place 07/01/2018   Essential hypertension 07/01/2018   MSH6-related Lynch syndrome (HNPCC5) 07/01/2018   Arthritis 07/01/2018   Leukopenia due to antineoplastic chemotherapy ([place]) 06/26/2018   Diarrhea 06/26/2018   ARF (acute renal failure) ([place])  06/25/2018   Diabetes mellitus without complication ([place]) 92/92/4462   Genetic testing 05/30/2018   Family history of colon cancer    Family history of genetic mutation for hereditary nonpolyposis colorectal cancer (HNPCC)    Endometrial cancer ([place]) 05/03/2018    Rennie Natter PT, DPT 01/27/2021, 3:10 PM  [facility] [address]  [place], [place], [address] Phone: [phone]   Fax:  [phone]  Name: Jordan Martinez MRN: [id number] Date of Birth: 11/01/1950

## 2021-01-30 ENCOUNTER — Ambulatory Visit: Payer: Medicare Other | Admitting: Physical Therapy

## 2021-01-30 ENCOUNTER — Other Ambulatory Visit: Payer: Self-pay

## 2021-01-30 DIAGNOSIS — M542 Cervicalgia: Secondary | ICD-10-CM

## 2021-01-30 DIAGNOSIS — R252 Cramp and spasm: Secondary | ICD-10-CM

## 2021-01-30 DIAGNOSIS — R202 Paresthesia of skin: Secondary | ICD-10-CM

## 2021-01-30 DIAGNOSIS — R29898 Other symptoms and signs involving the musculoskeletal system: Secondary | ICD-10-CM

## 2021-01-30 NOTE — Patient Instructions (Signed)
Access Code: [email or web address] URL: [email or web address] Date: 01/30/2021 Prepared by: Glenetta Hew  Exercises Cervical Extension AROM with Strap - 2 x daily - 7 x weekly - 10 reps Seated Assisted Cervical Rotation with Towel - 2 x daily - 7 x weekly - 10 reps Mid-Lower Cervical Sidebend SNAG - 2 x daily - 7 x weekly - 10 reps Seated Cervical Retraction - 2 x daily - 7 x weekly - 10 reps - 5 hold Standing Bilateral Low Shoulder Row with Anchored Resistance - 1 x daily - 7 x weekly - 2 sets - 10 reps* Shoulder Extension with Resistance - 1 x daily - 7 x weekly - 2 sets - 10 reps* Shoulder External Rotation and Scapular Retraction with Resistance - 1 x daily - 7 x weekly - 2 sets - 10 reps*  *added to HEP

## 2021-01-30 NOTE — Therapy (Signed)
[name] [address]  [place], [place], [address] Phone: [phone]   Fax:  [phone]  Physical Therapy Treatment  Patient Details  Name: Jordan Martinez MRN: [id number] Date of Birth: February 25, 1951 Referring Provider (PT): Frann Rider   Encounter Date: 01/30/2021   PT End of Session - 01/30/21 1459     Visit Number 4    Number of Visits 12    Date for PT Re-Evaluation 03/05/21    Authorization Type Medicare A&B, Aetna    Progress Note Due on Visit 10    PT Start Time 1405    PT Stop Time 1450    PT Time Calculation (min) 45 min    Activity Tolerance Patient tolerated treatment well    Behavior During Therapy [facility] for tasks assessed/performed             Past Medical History:  Diagnosis Date   Anxiety    on meds   Arthritis    on PRN meds   Cataract    bilateral cat. ext. with lens implant- in [place]   Diabetes mellitus without complication ([place])    diet controlled. States always has borderline HgbA1C   Endometrial cancer ([place])    endometrial CA-2019   Family history of colon cancer    daughter 34 colon CA   Family history of genetic mutation for hereditary nonpolyposis colorectal cancer (HNPCC)    GERD (gastroesophageal reflux disease)    on meds   High cholesterol    on meds   History of kidney stones    years    Hypertension    on meds   Neuropathy    Sensation of pressure in bladder area    Stroke ([place]) 07/17/2019   Uterine cramping     Past Surgical History:  Procedure Laterality Date   burnt the nerves  Right 04/29/2018   in right knee    burnt the nerves  Left 04/22/2018   left knee nerve    COLONOSCOPY  12/2018   [place]- 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   cystoscopy kidney stone   1985   HYSTERECTOMY ABDOMINAL WITH SALPINGO-OOPHORECTOMY  2019   IR CT HEAD LTD  07/18/2019   IR IMAGING GUIDED PORT INSERTION  06/15/2018   IR PERCUTANEOUS ART THROMBECTOMY/INFUSION  INTRACRANIAL INC DIAG ANGIO  07/18/2019   IR REMOVAL TUN ACCESS W/ PORT W/O FL MOD SED  12/01/2018   LOOP RECORDER INSERTION N/A 07/19/2019   Procedure: LOOP RECORDER INSERTION;  Surgeon: Constance Haw, MD;  Location: [facility];  Service: Cardiovascular;  Laterality: N/A;   RADIOLOGY WITH ANESTHESIA N/A 07/17/2019   Procedure: IR WITH ANESTHESIA;  Surgeon: Luanne Bras, MD;  Location: [facility];  Service: Radiology;  Laterality: N/A;   REPLACEMENT TOTAL KNEE BILATERAL Bilateral 2018   Right x 2. 2019 undergoing intermittent injection "RFA" procedures with Dr. Sheliah Mends [facility]   REVISION TOTAL KNEE ARTHROPLASTY Right 03/2017   ROBOTIC ASSISTED TOTAL HYSTERECTOMY WITH BILATERAL SALPINGO OOPHERECTOMY N/A 05/03/2018   Procedure: XI ROBOTIC ASSISTED TOTAL HYSTERECTOMY WITH BILATERAL SALPINGO OOPHORECTOMY;  Surgeon: Everitt Amber, MD;  Location: [facility];  Service: Gynecology;  Laterality: N/A;   SENTINEL NODE BIOPSY N/A 05/03/2018   Procedure: SENTINEL NODE BIOPSY;  Surgeon: Everitt Amber, MD;  Location: [facility];  Service: Gynecology;  Laterality: N/A;   TONSILLECTOMY AND ADENOIDECTOMY     TUBAL LIGATION      There were no vitals filed for  this visit.   Subjective Assessment - 01/30/21 1405     Subjective Pt. reports she had kenalog injection to hip on Tuesday, now her hip/knee pain is much better.  The neck is the same as Monday.    Pertinent History history of cancer, chemotherapy,    Limitations Reading   sleeping   How long can you sit comfortably? 30 - rolls blanket behind neck    How long can you stand comfortably? no limitations    How long can you walk comfortably? no difficulty    Diagnostic tests MR brain w/wo 12/29/2020 showed chronic left temporal and left insular ischemic infarcts but no evidence of acute findings. MR cervical 12/29/2020 degenerative changes with mild narrowing C4, C5 and C6 and severe left foraminal stenosis at C3-4 and severe bilateral foraminal  stenosis C4-5.    Patient Stated Goals no more neck pain and no more tingling    Currently in Pain? Yes    Pain Score 3     Pain Location Neck    Pain Orientation Right    Pain Onset Today                               [facility] Adult PT Treatment/Exercise - 01/30/21 0001       Exercises   Exercises Neck      Neck Exercises: Machines for Strengthening   Nustep L5 x 5 min      Neck Exercises: Theraband   Scapula Retraction 20 reps;Red    Shoulder Extension 20 reps;Red    Rows 20 reps;Red      Neck Exercises: Standing   Wall Push Ups 20 reps    Other Standing Exercises --      Neck Exercises: Seated   Neck Retraction 10 reps;3 secs    Shoulder Rolls 10 reps    Shoulder Rolls Limitations between TB sets      Manual Therapy   Manual Therapy Soft tissue mobilization;Manual Traction;Other (comment)   Dry Needling   Soft tissue mobilization STM to cervical paraspinals    Manual Traction to cervical region, reported decreased pain    Other Manual Therapy PROM, NAGs into rotation.              Trigger Point Dry Needling - 01/30/21 0001     Consent Given? Yes    Education Handout Provided Previously provided    Muscles Treated Head and Neck Cervical multifidi;Upper trapezius    Upper Trapezius Response Twitch reponse elicited;Palpable increased muscle length    Cervical multifidi Response Twitch reponse elicited;Palpable increased muscle length                  PT Education - 01/30/21 1455     Education Details progressed HEP,  Access Code: [email or web address]    Person(s) Educated Patient    Methods Explanation;Handout    Comprehension Verbalized understanding              PT Short Term Goals - 01/30/21 1502       PT SHORT TERM GOAL #1   Title Pt. will be compliant with initial HEP to improve cervical ROM to decrease neck pain    Time 2    Period Weeks    Status Achieved    Target Date 02/05/21               PT Long Term Goals -  01/22/21 1206  PT LONG TERM GOAL #1   Title Pt. will report 50% improvement in occurance of tingling in R side of neck/face/shoulder    Baseline occurs intermittantly thorughout the day, 3x during evaluation    Time 6    Period Weeks    Status New    Target Date 03/05/21      PT LONG TERM GOAL #2   Title Pt. will report no more than 3/10 neck pain at rest at end of day.    Baseline 5/10 neck pain in morning increasing to 9/10 by end of day    Time 6    Period Weeks    Status New    Target Date 03/05/21      PT LONG TERM GOAL #3   Title Pt. will demonstrate improved cervical ROM by at least 15 deg all directions to perform ADLs    Baseline limited cervical flexion/extension/sidbending to 20 deg, R rotation only 35 deg, L rotation 50 deg, pain with all movements.    Time 6    Period Weeks    Status New    Target Date 03/05/21      PT LONG TERM GOAL #4   Title Pt. will be able to lay on stomach to read for 15 minutes without increased neck pain.    Baseline unable to read in this position, favorite position    Time 6    Period Weeks    Status New    Target Date 03/05/21                   Plan - 01/30/21 1459     Clinical Impression Statement Patient reported no changes in neck pain since previous session, and very compliant with HEP.  Noted today chin tucks have improved significantly.  Progressed exercises for postural strengthening with TB exercises, tolerated well, exercises added to HEP and Red TB issued.  Performed dry needling to R cervical multifidus (C3-C7) and R UT, noted definite softening of muscles afterwards, patient tolerated very well, with improved cervical ROM for rotation bil without guarding today.  Patient would benefit from continued skilled therapy.    Personal Factors and Comorbidities Age;Comorbidity 3+    Comorbidities history of CVA, cancer, chemotherapy and deconditioning, arthritis, bil TKR, smoker    Examination-Activity Limitations  Bend;Lift;Sleep;Sit;Hygiene/Grooming    Examination-Participation Restrictions Interpersonal Relationship;Driving;Community Activity;Other    Stability/Clinical Decision Making Unstable/Unpredictable    Rehab Potential Good    PT Frequency 2x / week    PT Duration 6 weeks    PT Treatment/Interventions Cryotherapy;Moist Heat;Traction;Therapeutic activities;Therapeutic exercise;Neuromuscular re-education;Manual techniques;Passive range of motion;Dry needling;Taping;Spinal Manipulations;Joint Manipulations    PT Next Visit Plan review and progress HEP for postural strengthening, manual therapy/modalities PRN, measure ROM    PT Home Exercise Plan [email or web address]    Consulted and Agree with Plan of Care Patient             Patient will benefit from skilled therapeutic intervention in order to improve the following deficits and impairments:  Hypomobility, Increased muscle spasms, Decreased range of motion, Decreased activity tolerance, Decreased strength, Impaired flexibility, Pain, Postural dysfunction  Visit Diagnosis: Cervicalgia  Cramp and spasm  Other symptoms and signs involving the musculoskeletal system  Tingling of face     Problem List Patient Active Problem List   Diagnosis Date Noted   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   Middle cerebral artery embolism, 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   SAH (subarachnoid hemorrhage) (HCC)    Mixed hyperlipidemia  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, embolic 26/02/8834   Yeast infection 11/15/2018   Preventive measure 10/28/2018   Gout attack 08/08/2018   Dysuria 08/01/2018   Elevated liver enzymes 08/01/2018   Chemotherapy-induced nausea 07/14/2018   Other constipation 07/14/2018   Physical debility 07/08/2018   Port-A-Cath in place 07/01/2018   Essential hypertension 07/01/2018   MSH6-related Lynch syndrome (HNPCC5) 07/01/2018   Arthritis 07/01/2018   Leukopenia due to antineoplastic chemotherapy ([place]) 06/26/2018   Diarrhea  06/26/2018   ARF (acute renal failure) ([place]) 06/25/2018   Diabetes mellitus without complication ([place]) 84/46/5207   Genetic testing 05/30/2018   Family history of colon cancer    Family history of genetic mutation for hereditary nonpolyposis colorectal cancer (HNPCC)    Endometrial cancer ([place]) 05/03/2018    Rennie Natter PT, DPT 01/30/2021, 3:04 PM  [facility] [address]  [place], [place], [address] Phone: [phone]   Fax:  [phone]  Name: Jordan Martinez MRN: [id number] Date of Birth: June 21, 1951

## 2021-02-04 ENCOUNTER — Encounter: Payer: Self-pay | Admitting: Physical Therapy

## 2021-02-04 ENCOUNTER — Ambulatory Visit: Payer: Medicare Other | Attending: Adult Health | Admitting: Physical Therapy

## 2021-02-04 ENCOUNTER — Other Ambulatory Visit: Payer: Self-pay

## 2021-02-04 DIAGNOSIS — R252 Cramp and spasm: Secondary | ICD-10-CM | POA: Diagnosis present

## 2021-02-04 DIAGNOSIS — R202 Paresthesia of skin: Secondary | ICD-10-CM | POA: Diagnosis present

## 2021-02-04 DIAGNOSIS — R29898 Other symptoms and signs involving the musculoskeletal system: Secondary | ICD-10-CM | POA: Insufficient documentation

## 2021-02-04 DIAGNOSIS — M542 Cervicalgia: Secondary | ICD-10-CM | POA: Diagnosis present

## 2021-02-04 NOTE — Therapy (Signed)
[facility] [address]  [place], [place], [address] Phone: [phone]   Fax:  [phone]  Physical Therapy Treatment  Patient Details  Name: Jordan Martinez MRN: [id number] Date of Birth: 1950-09-07 Referring Provider (PT): Frann Rider   Encounter Date: 02/04/2021   PT End of Session - 02/04/21 1808     Visit Number 5    Number of Visits 12    Date for PT Re-Evaluation 03/05/21    Authorization Type Medicare A&B, Aetna    Progress Note Due on Visit 10    PT Start Time 1537    PT Stop Time 1625    PT Time Calculation (min) 48 min    Activity Tolerance Patient tolerated treatment well    Behavior During Therapy [facility] for tasks assessed/performed             Past Medical History:  Diagnosis Date   Anxiety    on meds   Arthritis    on PRN meds   Cataract    bilateral cat. ext. with lens implant- in [place]   Diabetes mellitus without complication ([place])    diet controlled. States always has borderline HgbA1C   Endometrial cancer ([place])    endometrial CA-2019   Family history of colon cancer    daughter 55 colon CA   Family history of genetic mutation for hereditary nonpolyposis colorectal cancer (HNPCC)    GERD (gastroesophageal reflux disease)    on meds   High cholesterol    on meds   History of kidney stones    years    Hypertension    on meds   Neuropathy    Sensation of pressure in bladder area    Stroke ([place]) 07/17/2019   Uterine cramping     Past Surgical History:  Procedure Laterality Date   burnt the nerves  Right 04/29/2018   in right knee    burnt the nerves  Left 04/22/2018   left knee nerve    COLONOSCOPY  12/2018   [place]- 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   cystoscopy kidney stone   1985   HYSTERECTOMY ABDOMINAL WITH SALPINGO-OOPHORECTOMY  2019   IR CT HEAD LTD  07/18/2019   IR IMAGING GUIDED PORT INSERTION  06/15/2018   IR PERCUTANEOUS ART THROMBECTOMY/INFUSION  INTRACRANIAL INC DIAG ANGIO  07/18/2019   IR REMOVAL TUN ACCESS W/ PORT W/O FL MOD SED  12/01/2018   LOOP RECORDER INSERTION N/A 07/19/2019   Procedure: LOOP RECORDER INSERTION;  Surgeon: Constance Haw, MD;  Location: [facility];  Service: Cardiovascular;  Laterality: N/A;   RADIOLOGY WITH ANESTHESIA N/A 07/17/2019   Procedure: IR WITH ANESTHESIA;  Surgeon: Luanne Bras, MD;  Location: [facility];  Service: Radiology;  Laterality: N/A;   REPLACEMENT TOTAL KNEE BILATERAL Bilateral 2018   Right x 2. 2019 undergoing intermittent injection "RFA" procedures with Dr. Sheliah Mends [facility]   REVISION TOTAL KNEE ARTHROPLASTY Right 03/2017   ROBOTIC ASSISTED TOTAL HYSTERECTOMY WITH BILATERAL SALPINGO OOPHERECTOMY N/A 05/03/2018   Procedure: XI ROBOTIC ASSISTED TOTAL HYSTERECTOMY WITH BILATERAL SALPINGO OOPHORECTOMY;  Surgeon: Everitt Amber, MD;  Location: [facility];  Service: Gynecology;  Laterality: N/A;   SENTINEL NODE BIOPSY N/A 05/03/2018   Procedure: SENTINEL NODE BIOPSY;  Surgeon: Everitt Amber, MD;  [facility]: [facility];  Service: Gynecology;  Laterality: N/A;   TONSILLECTOMY AND ADENOIDECTOMY     TUBAL LIGATION      There were no vitals filed for  this visit.   Subjective Assessment - 02/04/21 1543     Subjective Pt. reports no changes and no worsening in neck pain since last session.  She has not had any tingling in her face today.  She has not take tylenol for neck pain since Saturday.  She had mammogram today, went to fitness center on Friday and Monday.    Pertinent History history of cancer, chemotherapy,    Limitations Reading   sleeping   How long can you sit comfortably? 30 - rolls blanket behind neck    How long can you stand comfortably? no limitations    How long can you walk comfortably? no difficulty    Diagnostic tests MR brain w/wo 12/29/2020 showed chronic left temporal and left insular ischemic infarcts but no evidence of acute findings. MR cervical 12/29/2020 degenerative  changes with mild narrowing C4, C5 and C6 and severe left foraminal stenosis at C3-4 and severe bilateral foraminal stenosis C4-5.    Patient Stated Goals no more neck pain and no more tingling    Currently in Pain? Yes    Pain Score 0-No pain    Pain Location Neck    Pain Orientation Right    Pain Onset Today                               [facility] Adult PT Treatment/Exercise - 02/04/21 0001       Exercises   Exercises Neck      Neck Exercises: Machines for Strengthening   Nustep L5 x 5 min      Neck Exercises: Standing   Neck Retraction 5 reps    Neck Retraction Limitations increased tingling in neck today so discontinued    Wall Push Ups 20 reps    Other Standing Exercises at wall, thoracic extensions/glides with bil extension, and single extension      Neck Exercises: Seated   Neck Retraction 10 reps;3 secs    Neck Retraction Limitations no tingling    Shoulder Rolls Backwards;5 reps      Modalities   Modalities Moist Heat      Moist Heat Therapy   Number Minutes Moist Heat 10 Minutes    Moist Heat Location Cervical      Manual Therapy   Manual Therapy Soft tissue mobilization;Joint mobilization    Manual therapy comments seated and supine    Joint Mobilization NAGS into rotation,    Soft tissue mobilization Active release of R levator scapulae, TPR to R L/S and upper trap, STM to cervical paraspinals    Manual Traction to cervical region, reported decreased pain                      PT Short Term Goals - 01/30/21 1502       PT SHORT TERM GOAL #1   Title Pt. will be compliant with initial HEP to improve cervical ROM to decrease neck pain    Time 2    Period Weeks    Status Achieved    Target Date 02/05/21               PT Long Term Goals - 01/22/21 1206       PT LONG TERM GOAL #1   Title Pt. will report 50% improvement in occurance of tingling in R side of neck/face/shoulder    Baseline occurs intermittantly  thorughout the day, 3x during evaluation  Time 6    Period Weeks    Status New    Target Date 03/05/21      PT LONG TERM GOAL #2   Title Pt. will report no more than 3/10 neck pain at rest at end of day.    Baseline 5/10 neck pain in morning increasing to 9/10 by end of day    Time 6    Period Weeks    Status New    Target Date 03/05/21      PT LONG TERM GOAL #3   Title Pt. will demonstrate improved cervical ROM by at least 15 deg all directions to perform ADLs    Baseline limited cervical flexion/extension/sidbending to 20 deg, R rotation only 35 deg, L rotation 50 deg, pain with all movements.    Time 6    Period Weeks    Status New    Target Date 03/05/21      PT LONG TERM GOAL #4   Title Pt. will be able to lay on stomach to read for 15 minutes without increased neck pain.    Baseline unable to read in this position, favorite position    Time 6    Period Weeks    Status New    Target Date 03/05/21                   Plan - 02/04/21 1812     Clinical Impression Statement Patient is making good progress, using less OTC medication and also reporting less tingling now.  Today chin tucks against wall did exacerbate tingling, but seated chin tucks do not aggravate symptoms.  Manual release to levator scapulae on R, followed by manual therapy to cervical paraspinals and suboccipitals.  Pt. would benefit from continued skilled therapy to improve cervical ROM.    Personal Factors and Comorbidities Age;Comorbidity 3+    Comorbidities history of CVA, cancer, chemotherapy and deconditioning, arthritis, bil TKR, smoker    Examination-Activity Limitations Bend;Lift;Sleep;Sit;Hygiene/Grooming    Examination-Participation Restrictions Interpersonal Relationship;Driving;Community Activity;Other    Stability/Clinical Decision Making Unstable/Unpredictable    Rehab Potential Good    PT Frequency 2x / week    PT Duration 6 weeks    PT Treatment/Interventions Cryotherapy;Moist  Heat;Traction;Therapeutic activities;Therapeutic exercise;Neuromuscular re-education;Manual techniques;Passive range of motion;Dry needling;Taping;Spinal Manipulations;Joint Manipulations    PT Next Visit Plan continue postural strengthening, manual therapy/modalities PRN, measure ROM    PT Home Exercise Plan [email or web address]    Consulted and Agree with Plan of Care Patient             Patient will benefit from skilled therapeutic intervention in order to improve the following deficits and impairments:  Hypomobility, Increased muscle spasms, Decreased range of motion, Decreased activity tolerance, Decreased strength, Impaired flexibility, Pain, Postural dysfunction  Visit Diagnosis: Cervicalgia  Cramp and spasm  Other symptoms and signs involving the musculoskeletal system  Tingling of face     Problem List Patient Active Problem List   Diagnosis Date Noted   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   Middle cerebral artery embolism, 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   SAH (subarachnoid hemorrhage) (HCC)    Mixed hyperlipidemia    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, embolic 52/77/8242   Yeast infection 11/15/2018   Preventive measure 10/28/2018   Gout attack 08/08/2018   Dysuria 08/01/2018   Elevated liver enzymes 08/01/2018   Chemotherapy-induced nausea 07/14/2018   Other constipation 07/14/2018   Physical debility 07/08/2018   Port-A-Cath in place 07/01/2018   Essential  hypertension 07/01/2018   MSH6-related Lynch syndrome (HNPCC5) 07/01/2018   Arthritis 07/01/2018   Leukopenia due to antineoplastic chemotherapy ([place]) 06/26/2018   Diarrhea 06/26/2018   ARF (acute renal failure) ([place]) 06/25/2018   Diabetes mellitus without complication ([place]) 37/34/2876   Genetic testing 05/30/2018   Family history of colon cancer    Family history of genetic mutation for hereditary nonpolyposis colorectal cancer (HNPCC)    Endometrial cancer ([place]) 05/03/2018    Rennie Natter PT,  DPT 02/04/2021, 6:18 PM  [facility] [address]  [place], [place], [address] Phone: [phone]   Fax:  [phone]  Name: Jordan Martinez MRN: [id number] Date of Birth: 07-12-1950

## 2021-02-06 ENCOUNTER — Ambulatory Visit: Payer: Medicare Other | Admitting: Physical Therapy

## 2021-02-06 ENCOUNTER — Other Ambulatory Visit: Payer: Self-pay

## 2021-02-06 ENCOUNTER — Encounter: Payer: Self-pay | Admitting: Physical Therapy

## 2021-02-06 DIAGNOSIS — M542 Cervicalgia: Secondary | ICD-10-CM | POA: Diagnosis not present

## 2021-02-06 DIAGNOSIS — R29898 Other symptoms and signs involving the musculoskeletal system: Secondary | ICD-10-CM

## 2021-02-06 DIAGNOSIS — R202 Paresthesia of skin: Secondary | ICD-10-CM

## 2021-02-06 DIAGNOSIS — R252 Cramp and spasm: Secondary | ICD-10-CM

## 2021-02-06 LAB — CUP PACEART REMOTE DEVICE CHECK
Date Time Interrogation Session: 20220801230440
Implantable Pulse Generator Implant Date: 20210113

## 2021-02-06 NOTE — Therapy (Signed)
[facility] [address]  [place], [place], [address] Phone: [phone]   Fax:  [phone]  Physical Therapy Treatment  Patient Details  Name: Jordan Martinez MRN: [id number] Date of Birth: 07-Feb-1951 Referring Provider (PT): Frann Rider   Encounter Date: 02/06/2021   PT End of Session - 02/06/21 1325     Visit Number 6    Number of Visits 12    Date for PT Re-Evaluation 03/05/21    Authorization Type Medicare A&B, Aetna    Progress Note Due on Visit 10    PT Start Time 1316    PT Stop Time 1410    PT Time Calculation (min) 54 min    Activity Tolerance Patient tolerated treatment well    Behavior During Therapy WFL for tasks assessed/performed             Past Medical History:  Diagnosis Date   Anxiety    on meds   Arthritis    on PRN meds   Cataract    bilateral cat. ext. with lens implant- in [place]   Diabetes mellitus without complication ([place])    diet controlled. States always has borderline HgbA1C   Endometrial cancer ([place])    endometrial CA-2019   Family history of colon cancer    daughter 26 colon CA   Family history of genetic mutation for hereditary nonpolyposis colorectal cancer (HNPCC)    GERD (gastroesophageal reflux disease)    on meds   High cholesterol    on meds   History of kidney stones    years    Hypertension    on meds   Neuropathy    Sensation of pressure in bladder area    Stroke ([place]) 07/17/2019   Uterine cramping     Past Surgical History:  Procedure Laterality Date   burnt the nerves  Right 04/29/2018   in right knee    burnt the nerves  Left 04/22/2018   left knee nerve    COLONOSCOPY  12/2018   [place]- 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   cystoscopy kidney stone   1985   HYSTERECTOMY ABDOMINAL WITH SALPINGO-OOPHORECTOMY  2019   IR CT HEAD LTD  07/18/2019   IR IMAGING GUIDED PORT INSERTION  06/15/2018   IR PERCUTANEOUS ART THROMBECTOMY/INFUSION  INTRACRANIAL INC DIAG ANGIO  07/18/2019   IR REMOVAL TUN ACCESS W/ PORT W/O FL MOD SED  12/01/2018   LOOP RECORDER INSERTION N/A 07/19/2019   Procedure: LOOP RECORDER INSERTION;  Surgeon: Constance Haw, MD;  Location: [facility];  Service: Cardiovascular;  Laterality: N/A;   RADIOLOGY WITH ANESTHESIA N/A 07/17/2019   Procedure: IR WITH ANESTHESIA;  Surgeon: Luanne Bras, MD;  Location: [facility];  Service: Radiology;  Laterality: N/A;   REPLACEMENT TOTAL KNEE BILATERAL Bilateral 2018   Right x 2. 2019 undergoing intermittent injection "RFA" procedures with Dr. Sheliah Mends [facility]   REVISION TOTAL KNEE ARTHROPLASTY Right 03/2017   ROBOTIC ASSISTED TOTAL HYSTERECTOMY WITH BILATERAL SALPINGO OOPHERECTOMY N/A 05/03/2018   Procedure: XI ROBOTIC ASSISTED TOTAL HYSTERECTOMY WITH BILATERAL SALPINGO OOPHORECTOMY;  Surgeon: Everitt Amber, MD;  Location: [facility];  Service: Gynecology;  Laterality: N/A;   SENTINEL NODE BIOPSY N/A 05/03/2018   Procedure: SENTINEL NODE BIOPSY;  Surgeon: Everitt Amber, MD;  Location: [facility];  Service: Gynecology;  Laterality: N/A;   TONSILLECTOMY AND ADENOIDECTOMY     TUBAL LIGATION      There were no vitals filed for  this visit.   Subjective Assessment - 02/06/21 1320     Subjective Pt. reports that after last session the sharp pain and tingling came back.  Had to take 2 flexeril for the pain.  Woke up fine this morning.    Pertinent History history of cancer, chemotherapy,    Limitations Reading   sleeping   How long can you sit comfortably? 30 - rolls blanket behind neck    How long can you stand comfortably? no limitations    How long can you walk comfortably? no difficulty    Diagnostic tests MR brain w/wo 12/29/2020 showed chronic left temporal and left insular ischemic infarcts but no evidence of acute findings. MR cervical 12/29/2020 degenerative changes with mild narrowing C4, C5 and C6 and severe left foraminal stenosis at C3-4 and severe bilateral  foraminal stenosis C4-5.    Patient Stated Goals no more neck pain and no more tingling    Currently in Pain? No/denies    Pain Location Neck    Pain Orientation Right    Pain Onset Today                               [facility] Adult PT Treatment/Exercise - 02/06/21 0001       Exercises   Exercises Neck      Neck Exercises: Machines for Strengthening   UBE (Upper Arm Bike) L1 x 6 min (3 min foward/3 min retro)      Neck Exercises: Stretches   Upper Trapezius Stretch Left;5 reps    Levator Stretch Left;3 reps    Levator Stretch Limitations R increased tingling so discontinued    Other Neck Stretches repeated rotation to L x 10      Modalities   Modalities Moist Heat      Moist Heat Therapy   Number Minutes Moist Heat 10 Minutes    Moist Heat Location Cervical      Manual Therapy   Manual Therapy Joint mobilization;Manual Traction    Joint Mobilization mobilization with movement cervical spine L rotation    Manual Traction to cervical region, reported decreased pain                      PT Short Term Goals - 01/30/21 1502       PT SHORT TERM GOAL #1   Title Pt. will be compliant with initial HEP to improve cervical ROM to decrease neck pain    Time 2    Period Weeks    Status Achieved    Target Date 02/05/21               PT Long Term Goals - 01/22/21 1206       PT LONG TERM GOAL #1   Title Pt. will report 50% improvement in occurance of tingling in R side of neck/face/shoulder    Baseline occurs intermittantly thorughout the day, 3x during evaluation    Time 6    Period Weeks    Status New    Target Date 03/05/21      PT LONG TERM GOAL #2   Title Pt. will report no more than 3/10 neck pain at rest at end of day.    Baseline 5/10 neck pain in morning increasing to 9/10 by end of day    Time 6    Period Weeks    Status New    Target Date 03/05/21  PT LONG TERM GOAL #3   Title Pt. will demonstrate improved cervical  ROM by at least 15 deg all directions to perform ADLs    Baseline limited cervical flexion/extension/sidbending to 20 deg, R rotation only 35 deg, L rotation 50 deg, pain with all movements.    Time 6    Period Weeks    Status New    Target Date 03/05/21      PT LONG TERM GOAL #4   Title Pt. will be able to lay on stomach to read for 15 minutes without increased neck pain.    Baseline unable to read in this position, favorite position    Time 6    Period Weeks    Status New    Target Date 03/05/21                   Plan - 02/06/21 1408     Clinical Impression Statement Patient reported increased muscle spasm and tingling after last session, but better today.  checked cervical ROM and she had gained 10 deg in rotation bil and flexion, 5 deg of extension improvement (rotation 40 deg L, 45 deg R, 25 extension, 35 flexion) compared to last week.   Today focused on gentle stretching and ROM focused on turning to L as rotation to R aggravated symptoms.   Cues throughout for posture needed.  Manual include dry needling of R levator scapule, R UT, and R cervical erector spinae at L5, MWM for rotation and manual cervical traction, followed by MHP.  She tolerated interventions well and reported no pain or tingling after interventions.  Would benefit from  continued skilled therapy.    Personal Factors and Comorbidities Age;Comorbidity 3+    Comorbidities history of CVA, cancer, chemotherapy and deconditioning, arthritis, bil TKR, smoker    Examination-Activity Limitations Bend;Lift;Sleep;Sit;Hygiene/Grooming    Examination-Participation Restrictions Interpersonal Relationship;Driving;Community Activity;Other    Stability/Clinical Decision Making Unstable/Unpredictable    Rehab Potential Good    PT Frequency 2x / week    PT Duration 6 weeks    PT Treatment/Interventions Cryotherapy;Moist Heat;Traction;Therapeutic activities;Therapeutic exercise;Neuromuscular re-education;Manual  techniques;Passive range of motion;Dry needling;Taping;Spinal Manipulations;Joint Manipulations    PT Next Visit Plan continue postural strengthening, manual therapy/modalities PRN    PT Home Exercise Plan [email or web address]    Consulted and Agree with Plan of Care Patient             Patient will benefit from skilled therapeutic intervention in order to improve the following deficits and impairments:  Hypomobility, Increased muscle spasms, Decreased range of motion, Decreased activity tolerance, Decreased strength, Impaired flexibility, Pain, Postural dysfunction  Visit Diagnosis: Cervicalgia  Cramp and spasm  Other symptoms and signs involving the musculoskeletal system  Tingling of face     Problem List Patient Active Problem List   Diagnosis Date Noted   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   Middle cerebral artery embolism, 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   SAH (subarachnoid hemorrhage) ([place])    Mixed hyperlipidemia    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, embolic 49/17/9150   Yeast infection 11/15/2018   Preventive measure 10/28/2018   Gout attack 08/08/2018   Dysuria 08/01/2018   Elevated liver enzymes 08/01/2018   Chemotherapy-induced nausea 07/14/2018   Other constipation 07/14/2018   Physical debility 07/08/2018   Port-A-Cath in place 07/01/2018   Essential hypertension 07/01/2018   MSH6-related Lynch syndrome (HNPCC5) 07/01/2018   Arthritis 07/01/2018   Leukopenia due to antineoplastic chemotherapy ([place]) 06/26/2018   Diarrhea 06/26/2018  ARF (acute renal failure) ([place]) 06/25/2018   Diabetes mellitus without complication ([place]) 62/19/4712   Genetic testing 05/30/2018   Family history of colon cancer    Family history of genetic mutation for hereditary nonpolyposis colorectal cancer (HNPCC)    Endometrial cancer ([place]) 05/03/2018    Rennie Natter PT, DPT 02/06/2021, 4:10 PM  [facility] [address]  [place], [place], [address] Phone: [phone]   Fax:  [phone]  Name: Jordan Martinez MRN: [id number] Date of Birth: December 20, 1950

## 2021-02-10 ENCOUNTER — Other Ambulatory Visit: Payer: Self-pay

## 2021-02-10 ENCOUNTER — Ambulatory Visit (INDEPENDENT_AMBULATORY_CARE_PROVIDER_SITE_OTHER): Payer: Medicare Other

## 2021-02-10 ENCOUNTER — Ambulatory Visit: Payer: Medicare Other | Admitting: Physical Therapy

## 2021-02-10 DIAGNOSIS — I63412 Cerebral infarction due to embolism of left middle cerebral artery: Secondary | ICD-10-CM | POA: Diagnosis not present

## 2021-02-10 DIAGNOSIS — R252 Cramp and spasm: Secondary | ICD-10-CM

## 2021-02-10 DIAGNOSIS — R29898 Other symptoms and signs involving the musculoskeletal system: Secondary | ICD-10-CM

## 2021-02-10 DIAGNOSIS — M542 Cervicalgia: Secondary | ICD-10-CM

## 2021-02-10 DIAGNOSIS — R202 Paresthesia of skin: Secondary | ICD-10-CM

## 2021-02-10 NOTE — Therapy (Signed)
[facility] [address]  [place], [place], [address] Phone: [phone]   Fax:  [phone]  Physical Therapy Treatment  Patient Details  Name: Jordan Martinez MRN: [id number] Date of Birth: 05-24-1951 Referring Provider (PT): Frann Rider   Encounter Date: 02/10/2021   PT End of Session - 02/10/21 1153     Visit Number 7    Number of Visits 12    Date for PT Re-Evaluation 03/05/21    Authorization Type Medicare A&B, Aetna    Progress Note Due on Visit 10    PT Start Time 1100    PT Stop Time 1145    PT Time Calculation (min) 45 min    Activity Tolerance Patient tolerated treatment well    Behavior During Therapy [facility] for tasks assessed/performed             Past Medical History:  Diagnosis Date   Anxiety    on meds   Arthritis    on PRN meds   Cataract    bilateral cat. ext. with lens implant- in [place]   Diabetes mellitus without complication ([place])    diet controlled. States always has borderline HgbA1C   Endometrial cancer ([place])    endometrial CA-2019   Family history of colon cancer    daughter 7 colon CA   Family history of genetic mutation for hereditary nonpolyposis colorectal cancer (HNPCC)    GERD (gastroesophageal reflux disease)    on meds   High cholesterol    on meds   History of kidney stones    years    Hypertension    on meds   Neuropathy    Sensation of pressure in bladder area    Stroke ([place]) 07/17/2019   Uterine cramping     Past Surgical History:  Procedure Laterality Date   burnt the nerves  Right 04/29/2018   in right knee    burnt the nerves  Left 04/22/2018   left knee nerve    COLONOSCOPY  12/2018   [place]- 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   cystoscopy kidney stone   1985   HYSTERECTOMY ABDOMINAL WITH SALPINGO-OOPHORECTOMY  2019   IR CT HEAD LTD  07/18/2019   IR IMAGING GUIDED PORT INSERTION  06/15/2018   IR PERCUTANEOUS ART THROMBECTOMY/INFUSION  INTRACRANIAL INC DIAG ANGIO  07/18/2019   IR REMOVAL TUN ACCESS W/ PORT W/O FL MOD SED  12/01/2018   LOOP RECORDER INSERTION N/A 07/19/2019   Procedure: LOOP RECORDER INSERTION;  Surgeon: Constance Haw, MD;  Location: [facility];  Service: Cardiovascular;  Laterality: N/A;   RADIOLOGY WITH ANESTHESIA N/A 07/17/2019   Procedure: IR WITH ANESTHESIA;  Surgeon: Luanne Bras, MD;  Location: [facility];  Service: Radiology;  Laterality: N/A;   REPLACEMENT TOTAL KNEE BILATERAL Bilateral 2018   Right x 2. 2019 undergoing intermittent injection "RFA" procedures with Dr. Sheliah Mends [facility]   REVISION TOTAL KNEE ARTHROPLASTY Right 03/2017   ROBOTIC ASSISTED TOTAL HYSTERECTOMY WITH BILATERAL SALPINGO OOPHERECTOMY N/A 05/03/2018   Procedure: XI ROBOTIC ASSISTED TOTAL HYSTERECTOMY WITH BILATERAL SALPINGO OOPHORECTOMY;  Surgeon: Everitt Amber, MD;  [facility];  Service: Gynecology;  Laterality: N/A;   SENTINEL NODE BIOPSY N/A 05/03/2018   Procedure: SENTINEL NODE BIOPSY;  Surgeon: Everitt Amber, MD;  [facility];  Service: Gynecology;  Laterality: N/A;   TONSILLECTOMY AND ADENOIDECTOMY     TUBAL LIGATION      There were no vitals filed for  this visit.   Subjective Assessment - 02/10/21 1104     Subjective Pt reports soreness in R neck/shoulder following needling on Friday, so went to gym on Saturday instead, when it felt better.  She still getting tingling in R side of neck but not in her face. Had headache yesterday and took extra $RemoveBe'10mg'pgoxSNdJm$  of Amitryptiline.    Pertinent History history of cancer, chemotherapy,    Limitations Reading   sleeping   How long can you sit comfortably? 30 - rolls blanket behind neck    How long can you stand comfortably? no limitations    How long can you walk comfortably? no difficulty    Diagnostic tests MR brain w/wo 12/29/2020 showed chronic left temporal and left insular ischemic infarcts but no evidence of acute findings. MR cervical 12/29/2020  degenerative changes with mild narrowing C4, C5 and C6 and severe left foraminal stenosis at C3-4 and severe bilateral foraminal stenosis C4-5.    Patient Stated Goals no more neck pain and no more tingling    Currently in Pain? Yes    Pain Score 1     Pain Location Neck    Pain Orientation Right    Pain Onset Today                               [facility] Adult PT Treatment/Exercise - 02/10/21 0001       Exercises   Exercises Neck      Neck Exercises: Machines for Strengthening   UBE (Upper Arm Bike) L1 x 6 min (3 min foward/3 min retro)      Neck Exercises: Standing   Wall Push Ups 20 reps    Wall Wash step and stretch with RUE into flexion 2 x 10    Other Standing Exercises scap punches bil 2 x 10    Other Standing Exercises gentle chin tuck and rotation with ball behind head      Manual Therapy   Manual Therapy Joint mobilization;Manual Traction;Soft tissue mobilization    Joint Mobilization PA mobs to improve extension, NAGS into rotation bil    Soft tissue mobilization STM to cervical paraspinals and R scalenes    Manual Traction to cervical region, reported decreased pain                      PT Short Term Goals - 01/30/21 1502       PT SHORT TERM GOAL #1   Title Pt. will be compliant with initial HEP to improve cervical ROM to decrease neck pain    Time 2    Period Weeks    Status Achieved    Target Date 02/05/21               PT Long Term Goals - 01/22/21 1206       PT LONG TERM GOAL #1   Title Pt. will report 50% improvement in occurance of tingling in R side of neck/face/shoulder    Baseline occurs intermittantly thorughout the day, 3x during evaluation    Time 6    Period Weeks    Status New    Target Date 03/05/21      PT LONG TERM GOAL #2   Title Pt. will report no more than 3/10 neck pain at rest at end of day.    Baseline 5/10 neck pain in morning increasing to 9/10 by end of day    Time  6    Period Weeks     Status New    Target Date 03/05/21      PT LONG TERM GOAL #3   Title Pt. will demonstrate improved cervical ROM by at least 15 deg all directions to perform ADLs    Baseline limited cervical flexion/extension/sidbending to 20 deg, R rotation only 35 deg, L rotation 50 deg, pain with all movements.    Time 6    Period Weeks    Status New    Target Date 03/05/21      PT LONG TERM GOAL #4   Title Pt. will be able to lay on stomach to read for 15 minutes without increased neck pain.    Baseline unable to read in this position, favorite position    Time 6    Period Weeks    Status New    Target Date 03/05/21                   Plan - 02/10/21 1154     Clinical Impression Statement Pt reports continued tingling R side of neck but not into her face.  She had soreness in R levator after dry needling last session for 24 hours, reminded this was not unexpected considering the strong twitches elicited from this muscle last session, today L/S felt softened and had no trigger points.  She is still very guarded with head movements, and lacks extension, but rotation was 45 deg bil today.  Focused on very gentle postural strengthening exercises today, needed a lot of verbal and tactile cues to perform correctly, followed by manual therapy to improve ROM.  She declined MHP at end of session due to Dr. appt.  She would benefit from continued skilled therapy.    Personal Factors and Comorbidities Age;Comorbidity 3+    Comorbidities history of CVA, cancer, chemotherapy and deconditioning, arthritis, bil TKR, smoker    Examination-Activity Limitations Bend;Lift;Sleep;Sit;Hygiene/Grooming    Examination-Participation Restrictions Interpersonal Relationship;Driving;Community Activity;Other    Stability/Clinical Decision Making Unstable/Unpredictable    Rehab Potential Good    PT Frequency 2x / week    PT Duration 6 weeks    PT Treatment/Interventions Cryotherapy;Moist Heat;Traction;Therapeutic  activities;Therapeutic exercise;Neuromuscular re-education;Manual techniques;Passive range of motion;Dry needling;Taping;Spinal Manipulations;Joint Manipulations    PT Next Visit Plan continue postural strengthening, manual therapy/modalities PRN    PT Home Exercise Plan [email or web address]    Consulted and Agree with Plan of Care Patient             Patient will benefit from skilled therapeutic intervention in order to improve the following deficits and impairments:  Hypomobility, Increased muscle spasms, Decreased range of motion, Decreased activity tolerance, Decreased strength, Impaired flexibility, Pain, Postural dysfunction  Visit Diagnosis: Cervicalgia  Cramp and spasm  Other symptoms and signs involving the musculoskeletal system  Tingling of face     Problem List Patient Active Problem List   Diagnosis Date Noted   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   Middle cerebral artery embolism, 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   SAH (subarachnoid hemorrhage) ([place])    Mixed hyperlipidemia    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, embolic 16/07/930   Yeast infection 11/15/2018   Preventive measure 10/28/2018   Gout attack 08/08/2018   Dysuria 08/01/2018   Elevated liver enzymes 08/01/2018   Chemotherapy-induced nausea 07/14/2018   Other constipation 07/14/2018   Physical debility 07/08/2018   Port-A-Cath in place 07/01/2018   Essential hypertension 07/01/2018   MSH6-related Lynch syndrome (HNPCC5) 07/01/2018  Arthritis 07/01/2018   Leukopenia due to antineoplastic chemotherapy ([place]) 06/26/2018   Diarrhea 06/26/2018   ARF (acute renal failure) ([place]) 06/25/2018   Diabetes mellitus without complication ([place]) 21/78/3754   Genetic testing 05/30/2018   Family history of colon cancer    Family history of genetic mutation for hereditary nonpolyposis colorectal cancer (HNPCC)    Endometrial cancer ([place]) 05/03/2018    Rennie Natter PT, DPT 02/10/2021, 11:58 AM  [facility] [address]  [place], [place], [address] Phone: [phone]   Fax:  [phone]  Name: Jordan Martinez MRN: [id number] Date of Birth: [birth year]

## 2021-02-11 ENCOUNTER — Ambulatory Visit: Payer: Medicare Other | Admitting: Physical Therapy

## 2021-02-12 ENCOUNTER — Encounter: Payer: Self-pay | Admitting: Physical Therapy

## 2021-02-12 ENCOUNTER — Encounter: Payer: Medicare Other | Admitting: Physical Therapy

## 2021-02-12 ENCOUNTER — Other Ambulatory Visit: Payer: Self-pay

## 2021-02-12 ENCOUNTER — Ambulatory Visit: Payer: Medicare Other | Admitting: Physical Therapy

## 2021-02-12 DIAGNOSIS — R29898 Other symptoms and signs involving the musculoskeletal system: Secondary | ICD-10-CM

## 2021-02-12 DIAGNOSIS — M542 Cervicalgia: Secondary | ICD-10-CM

## 2021-02-12 DIAGNOSIS — R202 Paresthesia of skin: Secondary | ICD-10-CM

## 2021-02-12 DIAGNOSIS — R252 Cramp and spasm: Secondary | ICD-10-CM

## 2021-02-12 NOTE — Therapy (Signed)
[facility] [address]  [place], [place], [address] Phone: [phone]   Fax:  [phone]  Physical Therapy Treatment  Patient Details  Name: Renay Crammer MRN: [id number] Date of Birth: [birth year] Referring Provider (PT): Frann Rider   Encounter Date: 02/12/2021   PT End of Session - 02/12/21 1617     Visit Number 8    Number of Visits 12    Date for PT Re-Evaluation 03/05/21    Authorization Type Medicare A&B, Aetna    Progress Note Due on Visit 10    PT Start Time 1615    PT Stop Time 1710    PT Time Calculation (min) 55 min    Activity Tolerance Patient tolerated treatment well    Behavior During Therapy [facility] for tasks assessed/performed             Past Medical History:  Diagnosis Date   Anxiety    on meds   Arthritis    on PRN meds   Cataract    bilateral cat. ext. with lens implant- in [place]   Diabetes mellitus without complication ([place])    diet controlled. States always has borderline HgbA1C   Endometrial cancer ([place])    endometrial CA-2019   Family history of colon cancer    daughter 89 colon CA   Family history of genetic mutation for hereditary nonpolyposis colorectal cancer (HNPCC)    GERD (gastroesophageal reflux disease)    on meds   High cholesterol    on meds   History of kidney stones    years    Hypertension    on meds   Neuropathy    Sensation of pressure in bladder area    Stroke ([place]) 07/17/2019   Uterine cramping     Past Surgical History:  Procedure Laterality Date   burnt the nerves  Right 04/29/2018   in right knee    burnt the nerves  Left 04/22/2018   left knee nerve    COLONOSCOPY  12/2018   [place]- 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   cystoscopy kidney stone   1985   HYSTERECTOMY ABDOMINAL WITH SALPINGO-OOPHORECTOMY  2019   IR CT HEAD LTD  07/18/2019   IR IMAGING GUIDED PORT INSERTION  06/15/2018   IR PERCUTANEOUS ART THROMBECTOMY/INFUSION  INTRACRANIAL INC DIAG ANGIO  07/18/2019   IR REMOVAL TUN ACCESS W/ PORT W/O FL MOD SED  12/01/2018   LOOP RECORDER INSERTION N/A 07/19/2019   Procedure: LOOP RECORDER INSERTION;  Surgeon: Constance Haw, MD;  Location: [facility];  Service: Cardiovascular;  Laterality: N/A;   RADIOLOGY WITH ANESTHESIA N/A 07/17/2019   Procedure: IR WITH ANESTHESIA;  Surgeon: Luanne Bras, MD;  Location: [facility];  Service: Radiology;  Laterality: N/A;   REPLACEMENT TOTAL KNEE BILATERAL Bilateral 2018   Right x 2. 2019 undergoing intermittent injection "RFA" procedures with Dr. Sheliah Mends [facility]   REVISION TOTAL KNEE ARTHROPLASTY Right 03/2017   ROBOTIC ASSISTED TOTAL HYSTERECTOMY WITH BILATERAL SALPINGO OOPHERECTOMY N/A 05/03/2018   Procedure: XI ROBOTIC ASSISTED TOTAL HYSTERECTOMY WITH BILATERAL SALPINGO OOPHORECTOMY;  Surgeon: Everitt Amber, MD;  Location: [facility];  Service: Gynecology;  Laterality: N/A;   SENTINEL NODE BIOPSY N/A 05/03/2018   Procedure: SENTINEL NODE BIOPSY;  Surgeon: Everitt Amber, MD;  Location: [facility];  Service: Gynecology;  Laterality: N/A;   TONSILLECTOMY AND ADENOIDECTOMY     TUBAL LIGATION      There were no vitals filed for  this visit.   Subjective Assessment - 02/12/21 1618     Subjective Pt reports the tingling is back on the side of her jaw again, as well as on the R side of her neck.  She is trying to not take as much pain medication now for her neck, so more pain today.    Pertinent History history of cancer, chemotherapy,    Limitations Reading   sleeping   How long can you sit comfortably? 30 - rolls blanket behind neck    How long can you stand comfortably? no limitations    How long can you walk comfortably? no difficulty    Diagnostic tests MR brain w/wo 12/29/2020 showed chronic left temporal and left insular ischemic infarcts but no evidence of acute findings. 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    Patient Stated Goals no more neck pain and no more tingling    Currently in Pain? Yes    Pain Score 5     Pain Location Neck    Pain Orientation Right    Pain Onset Today                               [facility] Adult PT Treatment/Exercise - 02/12/21 0001       Exercises   Exercises Neck      Neck Exercises: Machines for Strengthening   UBE (Upper Arm Bike) L1 x 6 min (3 min foward/3 min retro)      Neck Exercises: Seated   Cervical Isometrics Left lateral flexion;Right lateral flexion;5 secs;5 reps;Right rotation;Left rotation;Flexion    Cervical Isometrics Limitations 50% max    Shoulder Rolls Backwards;5 reps    Other Seated Exercise anterior scalene stretch R side (L increased tingling)      Modalities   Modalities Moist Heat      Moist Heat Therapy   Number Minutes Moist Heat 10 Minutes    Moist Heat Location Cervical      Manual Therapy   Manual Therapy Joint mobilization;Manual Traction;Soft tissue mobilization;Other (comment)   Dry Needling   Manual therapy comments to cervical spine    Joint Mobilization PA mobs to improve extension, NAGS into rotation bil    Soft tissue mobilization STM to cervical paraspinals and R scalenes    Manual Traction to cervical region, reported decreased pain    Other Manual Therapy Dry Needling              Trigger Point Dry Needling - 02/12/21 0001     Consent Given? Yes    Education Handout Provided Previously provided    Muscles Treated Head and Neck Cervical multifidi;Upper trapezius   bil C3-C5, R UT   Upper Trapezius Response Twitch reponse elicited;Palpable increased muscle length    Cervical multifidi Response Twitch reponse elicited;Palpable increased muscle length                    PT Short Term Goals - 01/30/21 1502       PT SHORT TERM GOAL #1   Title Pt. will be compliant with initial HEP to improve  cervical ROM to decrease neck pain    Time 2    Period Weeks    Status Achieved    Target Date 02/05/21  PT Long Term Goals - 01/22/21 1206       PT LONG TERM GOAL #1   Title Pt. will report 50% improvement in occurance of tingling in R side of neck/face/shoulder    Baseline occurs intermittantly thorughout the day, 3x during evaluation    Time 6    Period Weeks    Status New    Target Date 03/05/21      PT LONG TERM GOAL #2   Title Pt. will report no more than 3/10 neck pain at rest at end of day.    Baseline 5/10 neck pain in morning increasing to 9/10 by end of day    Time 6    Period Weeks    Status New    Target Date 03/05/21      PT LONG TERM GOAL #3   Title Pt. will demonstrate improved cervical ROM by at least 15 deg all directions to perform ADLs    Baseline limited cervical flexion/extension/sidbending to 20 deg, R rotation only 35 deg, L rotation 50 deg, pain with all movements.    Time 6    Period Weeks    Status New    Target Date 03/05/21      PT LONG TERM GOAL #4   Title Pt. will be able to lay on stomach to read for 15 minutes without increased neck pain.    Baseline unable to read in this position, favorite position    Time 6    Period Weeks    Status New    Target Date 03/05/21                   Plan - 02/12/21 1723     Clinical Impression Statement Patient reports she is still getting intermittant tingling, but feels the dry needling has helped with the neck pain significantly.  She has now stopped taking OTC Tylenol.   Today noted still gets increased tingling when turning neck to the R.  Noted decreased tightness in cervical multifidus and R UT today after dry needling.  She did report some tingling in neck at end of session.  She would benefit from continued skilled therapy.    Personal Factors and Comorbidities Age;Comorbidity 3+    Comorbidities history of CVA, cancer, chemotherapy and deconditioning, arthritis, bil  TKR, smoker    Examination-Activity Limitations Bend;Lift;Sleep;Sit;Hygiene/Grooming    Examination-Participation Restrictions Interpersonal Relationship;Driving;Community Activity;Other    Stability/Clinical Decision Making Unstable/Unpredictable    Rehab Potential Good    PT Frequency 2x / week    PT Duration 6 weeks    PT Treatment/Interventions Cryotherapy;Moist Heat;Traction;Therapeutic activities;Therapeutic exercise;Neuromuscular re-education;Manual techniques;Passive range of motion;Dry needling;Taping;Spinal Manipulations;Joint Manipulations    PT Next Visit Plan continue postural strengthening, manual therapy/modalities PRN    PT Home Exercise Plan [email or web address]    Consulted and Agree with Plan of Care Patient             Patient will benefit from skilled therapeutic intervention in order to improve the following deficits and impairments:  Hypomobility, Increased muscle spasms, Decreased range of motion, Decreased activity tolerance, Decreased strength, Impaired flexibility, Pain, Postural dysfunction  Visit Diagnosis: Cervicalgia  Cramp and spasm  Other symptoms and signs involving the musculoskeletal system  Tingling of face     Problem List Patient Active Problem List   Diagnosis Date Noted   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   Middle cerebral artery embolism, 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   SAH (subarachnoid hemorrhage) (HCC)    Mixed hyperlipidemia  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, embolic 85/50/1586   Yeast infection 11/15/2018   Preventive measure 10/28/2018   Gout attack 08/08/2018   Dysuria 08/01/2018   Elevated liver enzymes 08/01/2018   Chemotherapy-induced nausea 07/14/2018   Other constipation 07/14/2018   Physical debility 07/08/2018   Port-A-Cath in place 07/01/2018   Essential hypertension 07/01/2018   MSH6-related Lynch syndrome (HNPCC5) 07/01/2018   Arthritis 07/01/2018   Leukopenia due to antineoplastic chemotherapy ([place])  06/26/2018   Diarrhea 06/26/2018   ARF (acute renal failure) ([place]) 06/25/2018   Diabetes mellitus without complication ([place]) 82/57/4935   Genetic testing 05/30/2018   Family history of colon cancer    Family history of genetic mutation for hereditary nonpolyposis colorectal cancer (HNPCC)    Endometrial cancer ([place]) 05/03/2018    Rennie Natter PT, DPT 02/12/2021, [facility] [address]  [place], [place], [address] Phone: [phone]   Fax:  [phone]  Name: Anushri Casalino MRN: [id number] Date of Birth: December 14, 1950

## 2021-02-17 ENCOUNTER — Other Ambulatory Visit: Payer: Self-pay

## 2021-02-17 ENCOUNTER — Encounter: Payer: Self-pay | Admitting: Physical Therapy

## 2021-02-17 ENCOUNTER — Ambulatory Visit: Payer: Medicare Other | Admitting: Physical Therapy

## 2021-02-17 DIAGNOSIS — R252 Cramp and spasm: Secondary | ICD-10-CM

## 2021-02-17 DIAGNOSIS — M542 Cervicalgia: Secondary | ICD-10-CM | POA: Diagnosis not present

## 2021-02-17 DIAGNOSIS — R29898 Other symptoms and signs involving the musculoskeletal system: Secondary | ICD-10-CM

## 2021-02-17 DIAGNOSIS — R202 Paresthesia of skin: Secondary | ICD-10-CM

## 2021-02-17 NOTE — Therapy (Signed)
[facility] [address]  [place], [place], [address] Phone: [phone]   Fax:  [phone]  Physical Therapy Treatment  Patient Details  Name: Jordan Martinez MRN: [id number] Date of Birth: 04-16-1951 Referring Provider (PT): Frann Rider   Encounter Date: 02/17/2021   PT End of Session - 02/17/21 0808     Visit Number 9    Number of Visits 12    Date for PT Re-Evaluation 03/05/21    Authorization Type Medicare A&B, Aetna    Progress Note Due on Visit 10    PT Start Time 0802    PT Stop Time 0854    PT Time Calculation (min) 52 min    Activity Tolerance Patient tolerated treatment well    Behavior During Therapy [facility] for tasks assessed/performed             Past Medical History:  Diagnosis Date   Anxiety    on meds   Arthritis    on PRN meds   Cataract    bilateral cat. ext. with lens implant- in [place]   Diabetes mellitus without complication ([place])    diet controlled. States always has borderline HgbA1C   Endometrial cancer ([place])    endometrial CA-2019   Family history of colon cancer    daughter 6 colon CA   Family history of genetic mutation for hereditary nonpolyposis colorectal cancer (HNPCC)    GERD (gastroesophageal reflux disease)    on meds   High cholesterol    on meds   History of kidney stones    years    Hypertension    on meds   Neuropathy    Sensation of pressure in bladder area    Stroke ([place]) 07/17/2019   Uterine cramping     Past Surgical History:  Procedure Laterality Date   burnt the nerves  Right 04/29/2018   in right knee    burnt the nerves  Left 04/22/2018   left knee nerve    COLONOSCOPY  12/2018   [place]- 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   cystoscopy kidney stone   1985   HYSTERECTOMY ABDOMINAL WITH SALPINGO-OOPHORECTOMY  2019   IR CT HEAD LTD  07/18/2019   IR IMAGING GUIDED PORT INSERTION  06/15/2018   IR PERCUTANEOUS ART THROMBECTOMY/INFUSION  INTRACRANIAL INC DIAG ANGIO  07/18/2019   IR REMOVAL TUN ACCESS W/ PORT W/O FL MOD SED  12/01/2018   LOOP RECORDER INSERTION N/A 07/19/2019   Procedure: LOOP RECORDER INSERTION;  Surgeon: Constance Haw, MD;  Location: [facility];  Service: Cardiovascular;  Laterality: N/A;   RADIOLOGY WITH ANESTHESIA N/A 07/17/2019   Procedure: IR WITH ANESTHESIA;  Surgeon: Luanne Bras, MD;  Location: [facility];  Service: Radiology;  Laterality: N/A;   REPLACEMENT TOTAL KNEE BILATERAL Bilateral 2018   Right x 2. 2019 undergoing intermittent injection "RFA" procedures with Dr. Sheliah Mends [facility]   REVISION TOTAL KNEE ARTHROPLASTY Right 03/2017   ROBOTIC ASSISTED TOTAL HYSTERECTOMY WITH BILATERAL SALPINGO OOPHERECTOMY N/A 05/03/2018   Procedure: XI ROBOTIC ASSISTED TOTAL HYSTERECTOMY WITH BILATERAL SALPINGO OOPHORECTOMY;  Surgeon: Everitt Amber, MD;  [facility];  Service: Gynecology;  Laterality: N/A;   SENTINEL NODE BIOPSY N/A 05/03/2018   Procedure: SENTINEL NODE BIOPSY;  Surgeon: Everitt Amber, MD;  [facility];  Service: Gynecology;  Laterality: N/A;   TONSILLECTOMY AND ADENOIDECTOMY     TUBAL LIGATION      There were no vitals filed for  this visit.   Subjective Assessment - 02/17/21 0806     Subjective Pt. reports she was "miserable" last night with tingling going down the side of her neck and side of her jaw, and neck pain 5/10, but did not take any medication.  This morning she has no pain or tingling.    Pertinent History history of cancer, chemotherapy,    Limitations Reading    How long can you sit comfortably? 30 - rolls blanket behind neck    Diagnostic tests MR brain w/wo 12/29/2020 showed chronic left temporal and left insular ischemic infarcts but no evidence of acute findings. MR cervical 12/29/2020 degenerative changes with mild narrowing C4, C5 and C6 and severe left foraminal stenosis at C3-4 and severe bilateral foraminal stenosis C4-5.    Patient Stated Goals no  more neck pain and no more tingling    Currently in Pain? No/denies    Pain Score 0-No pain    Pain Location Neck    Pain Orientation Right                               [facility] Adult PT Treatment/Exercise - 02/17/21 0001       Exercises   Exercises Neck      Neck Exercises: Machines for Strengthening   UBE (Upper Arm Bike) L1 x 6 min (3 min foward/3 min retro)      Neck Exercises: Theraband   Scapula Retraction 20 reps;Red    Shoulder Extension 20 reps;Red    Rows 20 reps;Red    Other Theraband Exercises Bicep curls x 20 Red    Other Theraband Exercises D2 2 x 10 bil, Red      Modalities   Modalities Moist Heat      Moist Heat Therapy   Number Minutes Moist Heat 10 Minutes    Moist Heat Location Cervical      Manual Therapy   Manual Therapy Joint mobilization;Manual Traction;Soft tissue mobilization    Manual therapy comments to cervical spine    Joint Mobilization PA mobs to improve extension, NAGS into rotation bil    Soft tissue mobilization STM to cervical paraspinals and R scalenes    Manual Traction to cervical region, reported decreased pain                    PT Education - 02/17/21 0828     Education Details Progressed HEP    Person(s) Educated Patient    Methods Explanation;Demonstration;Handout    Comprehension Verbalized understanding              PT Short Term Goals - 01/30/21 1502       PT SHORT TERM GOAL #1   Title Pt. will be compliant with initial HEP to improve cervical ROM to decrease neck pain    Time 2    Period Weeks    Status Achieved    Target Date 02/05/21               PT Long Term Goals - 01/22/21 1206       PT LONG TERM GOAL #1   Title Pt. will report 50% improvement in occurance of tingling in R side of neck/face/shoulder    Baseline occurs intermittantly thorughout the day, 3x during evaluation    Time 6    Period Weeks    Status New    Target Date 03/05/21      PT  LONG TERM  GOAL #2   Title Pt. will report no more than 3/10 neck pain at rest at end of day.    Baseline 5/10 neck pain in morning increasing to 9/10 by end of day    Time 6    Period Weeks    Status New    Target Date 03/05/21      PT LONG TERM GOAL #3   Title Pt. will demonstrate improved cervical ROM by at least 15 deg all directions to perform ADLs    Baseline limited cervical flexion/extension/sidbending to 20 deg, R rotation only 35 deg, L rotation 50 deg, pain with all movements.    Time 6    Period Weeks    Status New    Target Date 03/05/21      PT LONG TERM GOAL #4   Title Pt. will be able to lay on stomach to read for 15 minutes without increased neck pain.    Baseline unable to read in this position, favorite position    Time 6    Period Weeks    Status New    Target Date 03/05/21                   Plan - 02/17/21 0808     Clinical Impression Statement Patient continues to report intermittant tingling in neck associated with neck pain, primarily at the end of the day.  Reviewed theraband exercises and progressed today for scapular strengthening, cues throughout for technique and tied loops in ends due to complaint of hand arthritis to decrease discomfort.  Noted improvement in muscle tightness in cervical paraspinals, no complaint of pain with todays activities.  MHP at end for comfort.  Patient would benefit from continued skilled therapy.    Personal Factors and Comorbidities Age;Comorbidity 3+    Comorbidities history of CVA, cancer, chemotherapy and deconditioning, arthritis, bil TKR, smoker    Examination-Activity Limitations Bend;Lift;Sleep;Sit;Hygiene/Grooming    Examination-Participation Restrictions Interpersonal Relationship;Driving;Community Activity;Other    Stability/Clinical Decision Making Unstable/Unpredictable    Rehab Potential Good    PT Frequency 2x / week    PT Duration 6 weeks    PT Treatment/Interventions Cryotherapy;Moist  Heat;Traction;Therapeutic activities;Therapeutic exercise;Neuromuscular re-education;Manual techniques;Passive range of motion;Dry needling;Taping;Spinal Manipulations;Joint Manipulations    PT Next Visit Plan Progress note, complete FOTO, continue postural strengthening, manual therapy/modalities PRN.  Consider dry needling C6-T2.    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   Middle cerebral artery embolism, 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   SAH (subarachnoid hemorrhage) (HCC)    Mixed hyperlipidemia    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, embolic 57/90/3833   Yeast infection 11/15/2018   Preventive measure 10/28/2018   Gout attack 08/08/2018   Dysuria 08/01/2018   Elevated liver enzymes 08/01/2018   Chemotherapy-induced nausea 07/14/2018   Other constipation 07/14/2018   Physical debility 07/08/2018   Port-A-Cath in place 07/01/2018   Essential  hypertension 07/01/2018   MSH6-related Lynch syndrome (HNPCC5) 07/01/2018   Arthritis 07/01/2018   Leukopenia due to antineoplastic chemotherapy ([place]) 06/26/2018   Diarrhea 06/26/2018   ARF (acute renal failure) ([place]) 06/25/2018   Diabetes mellitus without complication ([place]) 11/00/3496   Genetic testing 05/30/2018   Family history of colon cancer    Family history of genetic mutation for hereditary nonpolyposis colorectal cancer (HNPCC)    Endometrial cancer  ([place]) 05/03/2018    Rennie Natter PT, DPT 02/17/2021, 8:52 AM  [facility] [address]  [place], [place], [address] Phone: [phone]   Fax:  [phone]  Name: Jordan Martinez MRN: [id number] Date of Birth: 09/21/1950

## 2021-02-17 NOTE — Patient Instructions (Signed)
Access Code: [email or web address] URL: [email or web address] Date: 02/17/2021 Prepared by: Glenetta Hew  Exercises Standing Shoulder Single Arm PNF D2 Flexion with Resistance - 1 x daily - 7 x weekly - 2 sets - 10 reps Standing Bicep Curls with Resistance - 1 x daily - 7 x weekly - 2 sets - 10 reps Standing Serratus Punch with Resistance - 1 x daily - 7 x weekly - 2 sets - 10 reps

## 2021-02-19 ENCOUNTER — Ambulatory Visit: Payer: Medicare Other | Admitting: Physical Therapy

## 2021-02-19 ENCOUNTER — Encounter: Payer: Self-pay | Admitting: Physical Therapy

## 2021-02-19 ENCOUNTER — Other Ambulatory Visit: Payer: Self-pay

## 2021-02-19 DIAGNOSIS — M542 Cervicalgia: Secondary | ICD-10-CM | POA: Diagnosis not present

## 2021-02-19 DIAGNOSIS — R29898 Other symptoms and signs involving the musculoskeletal system: Secondary | ICD-10-CM

## 2021-02-19 DIAGNOSIS — R252 Cramp and spasm: Secondary | ICD-10-CM

## 2021-02-19 DIAGNOSIS — R202 Paresthesia of skin: Secondary | ICD-10-CM

## 2021-02-19 NOTE — Therapy (Signed)
[facility] [address]  [place], [place], [address] Phone: [phone]   Fax:  [phone]  Physical Therapy Treatment/Progress Note   Progress Note Reporting Period 01/22/21 to 02/19/21  See note below for Objective Data and Assessment of Progress/Goals.     Patient Details  Name: Jordan Martinez MRN: [id number] Date of Birth: [birth year] Referring Provider (PT): Frann Rider   Encounter Date: 02/19/2021   PT End of Session - 02/19/21 1625     Visit Number 10    Number of Visits 20    Date for PT Re-Evaluation 03/26/21    Authorization Type Medicare A&B, Aetna    Progress Note Due on Visit 10    PT Start Time 1617    PT Stop Time 1700    PT Time Calculation (min) 43 min    Activity Tolerance Patient tolerated treatment well    Behavior During Therapy WFL for tasks assessed/performed             Past Medical History:  Diagnosis Date   Anxiety    on meds   Arthritis    on PRN meds   Cataract    bilateral cat. ext. with lens implant- in [place]   Diabetes mellitus without complication ([place])    diet controlled. States always has borderline HgbA1C   Endometrial cancer ([place])    endometrial CA-2019   Family history of colon cancer    daughter 56 colon CA   Family history of genetic mutation for hereditary nonpolyposis colorectal cancer (HNPCC)    GERD (gastroesophageal reflux disease)    on meds   High cholesterol    on meds   History of kidney stones    years    Hypertension    on meds   Neuropathy    Sensation of pressure in bladder area    Stroke ([place]) 07/17/2019   Uterine cramping     Past Surgical History:  Procedure Laterality Date   burnt the nerves  Right 04/29/2018   in right knee    burnt the nerves  Left 04/22/2018   left knee nerve    COLONOSCOPY  12/2018   [place]- 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   cystoscopy kidney stone   1985   HYSTERECTOMY ABDOMINAL WITH  SALPINGO-OOPHORECTOMY  2019   IR CT HEAD LTD  07/18/2019   IR IMAGING GUIDED PORT INSERTION  06/15/2018   IR PERCUTANEOUS ART THROMBECTOMY/INFUSION INTRACRANIAL INC DIAG ANGIO  07/18/2019   IR REMOVAL TUN ACCESS W/ PORT W/O FL MOD SED  12/01/2018   LOOP RECORDER INSERTION N/A 07/19/2019   Procedure: LOOP RECORDER INSERTION;  Surgeon: Constance Haw, MD;  Location: [facility];  Service: Cardiovascular;  Laterality: N/A;   RADIOLOGY WITH ANESTHESIA N/A 07/17/2019   Procedure: IR WITH ANESTHESIA;  Surgeon: Luanne Bras, MD;  Location: [facility];  Service: Radiology;  Laterality: N/A;   REPLACEMENT TOTAL KNEE BILATERAL Bilateral 2018   Right x 2. 2019 undergoing intermittent injection "RFA" procedures with Dr. Sheliah Mends [facility]   REVISION TOTAL KNEE ARTHROPLASTY Right 03/2017   ROBOTIC ASSISTED TOTAL HYSTERECTOMY WITH BILATERAL SALPINGO OOPHERECTOMY N/A 05/03/2018   Procedure: XI ROBOTIC ASSISTED TOTAL HYSTERECTOMY WITH BILATERAL SALPINGO OOPHORECTOMY;  Surgeon: Everitt Amber, MD;  [facility];  Service: Gynecology;  Laterality: N/A;   SENTINEL NODE BIOPSY N/A 05/03/2018   Procedure: SENTINEL NODE BIOPSY;  Surgeon: Everitt Amber, MD;  Location: [facility];  Service: Gynecology;  Laterality: N/A;   TONSILLECTOMY AND ADENOIDECTOMY     TUBAL LIGATION      There were no vitals filed for this visit.   Subjective Assessment - 02/19/21 1621     Subjective Patient reports pain was good all morning, did fitness gym this morning, but has some tingling and pain in neck today.    Pertinent History history of cancer, chemotherapy,    Limitations Reading    How long can you sit comfortably? 30 - rolls blanket behind neck    How long can you stand comfortably? no limitations    How long can you walk comfortably? no difficulty    Diagnostic tests MR brain w/wo 12/29/2020 showed chronic left temporal and left insular ischemic infarcts but no evidence of acute findings. MR cervical 12/29/2020  degenerative changes with mild narrowing C4, C5 and C6 and severe left foraminal stenosis at C3-4 and severe bilateral foraminal stenosis C4-5.    Patient Stated Goals no more neck pain and no more tingling    Currently in Pain? Yes    Pain Score 5     Pain Location Neck    Pain Orientation Right    Pain Descriptors / Indicators Tingling                [facility] PT Assessment - 02/19/21 0001       Assessment   Medical Diagnosis R20.2 Tingling sensation in face, M54.2 Cervicalgia    Referring Provider (PT) Frann Rider    Onset Date/Surgical Date 10/04/20    Hand Dominance Right    Next MD Visit 03/18/2021      AROM   AROM Assessment Site Cervical    Cervical Flexion 30    Cervical Extension 30    Cervical - Right Side Bend 25    Cervical - Left Side Bend 20    Cervical - Right Rotation 50    Cervical - Left Rotation 50                [facility] Adult PT Treatment/Exercise - 02/19/21 0001       Exercises   Exercises Neck      Neck Exercises: Machines for Strengthening   UBE (Upper Arm Bike) L1 x 6 min (3 min foward/3 min retro)      Manual Therapy   Manual Therapy Joint mobilization;Manual Traction;Soft tissue mobilization    Manual therapy comments to cervical spine    Joint Mobilization PA mobs to improve extension, NAGS into rotation bil    Soft tissue mobilization STM to cervical paraspinals and R scalenes    Manual Traction to cervical region, reported decreased pain    Other Manual Therapy dry needling              Trigger Point Dry Needling - 02/19/21 0001     Consent Given? Yes    Education Handout Provided Previously provided    Muscles Treated Head and Neck Cervical multifidi;Upper trapezius;Levator scapulae   R C6-7, R UT, R L/S   Upper Trapezius Response Twitch reponse elicited;Palpable increased muscle length    Levator Scapulae Response Twitch response elicited;Palpable increased muscle length    Cervical multifidi Response Twitch reponse  elicited;Palpable increased muscle length                  PT Education - 02/19/21 1927     Education Details reviewed HEP and exercises at gym    Person(s) Educated Patient    Methods Explanation  Comprehension Verbalized understanding              PT Short Term Goals - 01/30/21 1502       PT SHORT TERM GOAL #1   Title Pt. will be compliant with initial HEP to improve cervical ROM to decrease neck pain    Time 2    Period Weeks    Status Achieved    Target Date 02/05/21               PT Long Term Goals - 02/19/21 1628       PT LONG TERM GOAL #1   Title Pt. will report 50% improvement in occurance of tingling in R side of neck/face/shoulder    Baseline occurs intermittantly thorughout the day, 3x during evaluation    Time 6    Period Weeks    Status Partially Met   30% improvement, less frequent, no tingling in morning, not as much in face, just in jaw and side of neck   Target Date 03/26/21      PT LONG TERM GOAL #2   Title Pt. will report no more than 3/10 neck pain at rest at end of day.    Baseline 5/10 neck pain in morning increasing to 9/10 by end of day    Time 6    Period Weeks    Status Partially Met   progress, no pain in morning, 5/10 in evening.   Target Date 03/26/21      PT LONG TERM GOAL #3   Title Pt. will demonstrate improved cervical ROM by at least 15 deg all directions to perform ADLs    Baseline limited cervical flexion/extension/sidbending to 20 deg, R rotation only 35 deg, L rotation 50 deg, pain with all movements.    Time 6    Period Weeks    Status Partially Met   50 deg rotation bil, 30 deg extension, 30 deg flexion, 20 deg L SB, 25 deg R SB   Target Date 03/26/21      PT LONG TERM GOAL #4   Title Pt. will be able to lay on stomach to read for 15 minutes without increased neck pain.    Baseline unable to read in this position, favorite position    Time 6    Period Weeks    Status Not Met    Target Date 03/26/21                    Plan - 02/19/21 1920     Clinical Impression Statement Patient is making good progress, with decreased tingling overall and less pain.  Today she was extremely active and therefor had more neck pain on arrival and tingling.  Reviewed goals, noted improvements in cervical ROM but still limited and unable to extend neck comfortably for any length of time.  She would benefit from continued skilled therapy, extended her plan of care to 03/26/21 since she is making good progress.  After dry needling today she reported cessation of the tingling in her neck and jaw, although she did have a "deep ache".  She would benefit from continued skilled therapy.    Personal Factors and Comorbidities Age;Comorbidity 3+    Comorbidities history of CVA, cancer, chemotherapy and deconditioning, arthritis, bil TKR, smoker    Examination-Activity Limitations Bend;Lift;Sleep;Sit;Hygiene/Grooming    Examination-Participation Restrictions Interpersonal Relationship;Driving;Community Activity;Other    Stability/Clinical Decision Making Unstable/Unpredictable    Rehab Potential Good    PT Frequency 2x / week  PT Duration 6 weeks    PT Treatment/Interventions Cryotherapy;Moist Heat;Traction;Therapeutic activities;Therapeutic exercise;Neuromuscular re-education;Manual techniques;Passive range of motion;Dry needling;Taping;Spinal Manipulations;Joint Manipulations    PT Next Visit Plan Continue postural strengthening, manual therapy/modalities PRN.  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   Middle cerebral artery embolism, 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   SAH (subarachnoid hemorrhage) ([place])    Mixed hyperlipidemia    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, embolic 29/29/0903   Yeast infection 11/15/2018   Preventive measure 10/28/2018   Gout attack 08/08/2018   Dysuria 08/01/2018   Elevated liver enzymes 08/01/2018   Chemotherapy-induced nausea 07/14/2018   Other constipation 07/14/2018   Physical debility 07/08/2018   Port-A-Cath in place 07/01/2018   Essential hypertension 07/01/2018   MSH6-related Lynch syndrome (HNPCC5) 07/01/2018   Arthritis 07/01/2018   Leukopenia due to antineoplastic chemotherapy ([place]) 06/26/2018   Diarrhea 06/26/2018   ARF (acute renal failure) ([place]) 06/25/2018   Diabetes mellitus without complication ([place]) 01/49/9692   Genetic testing 05/30/2018   Family history of colon cancer    Family history of genetic mutation for hereditary nonpolyposis colorectal cancer (HNPCC)    Endometrial cancer ([place]) 05/03/2018    Rennie Natter PT, DPT 02/19/2021, 7:27 PM  [facility] [address]  [place], [place], [address] Phone: [phone]   Fax:  [phone]  Name: Cherylynn Liszewski MRN: [id number] Date of Birth: 04/28/1951

## 2021-02-19 NOTE — Addendum Note (Signed)
Addended by: Rennie Natter on: 02/19/2021 07:41 PM   Modules accepted: Orders

## 2021-02-19 NOTE — Patient Instructions (Signed)

## 2021-02-25 ENCOUNTER — Encounter: Payer: Self-pay | Admitting: Physical Therapy

## 2021-02-25 ENCOUNTER — Ambulatory Visit: Payer: Medicare Other | Admitting: Physical Therapy

## 2021-02-25 ENCOUNTER — Other Ambulatory Visit: Payer: Self-pay

## 2021-02-25 DIAGNOSIS — R29898 Other symptoms and signs involving the musculoskeletal system: Secondary | ICD-10-CM

## 2021-02-25 DIAGNOSIS — M542 Cervicalgia: Secondary | ICD-10-CM | POA: Diagnosis not present

## 2021-02-25 DIAGNOSIS — R252 Cramp and spasm: Secondary | ICD-10-CM

## 2021-02-25 DIAGNOSIS — R202 Paresthesia of skin: Secondary | ICD-10-CM

## 2021-02-25 NOTE — Therapy (Signed)
[facility] [address]  [place], [place], [address] Phone: [phone]   Fax:  [phone]  Physical Therapy Treatment  Patient Details  Name: Jordan Martinez MRN: [id number] Date of Birth: 1951/01/08 Referring Provider (PT): Frann Rider   Encounter Date: 02/25/2021   PT End of Session - 02/25/21 1408     Visit Number 11    Number of Visits 20    Date for PT Re-Evaluation 03/26/21    Authorization Type Medicare A&B, Aetna    Progress Note Due on Visit 10    PT Start Time 1401    PT Stop Time 1450    PT Time Calculation (min) 49 min    Activity Tolerance Patient tolerated treatment well    Behavior During Therapy WFL for tasks assessed/performed             Past Medical History:  Diagnosis Date   Anxiety    on meds   Arthritis    on PRN meds   Cataract    bilateral cat. ext. with lens implant- in [place]   Diabetes mellitus without complication ([place])    diet controlled. States always has borderline HgbA1C   Endometrial cancer ([place])    endometrial CA-2019   Family history of colon cancer    daughter 74 colon CA   Family history of genetic mutation for hereditary nonpolyposis colorectal cancer (HNPCC)    GERD (gastroesophageal reflux disease)    on meds   High cholesterol    on meds   History of kidney stones    years    Hypertension    on meds   Neuropathy    Sensation of pressure in bladder area    Stroke ([place]) 07/17/2019   Uterine cramping     Past Surgical History:  Procedure Laterality Date   burnt the nerves  Right 04/29/2018   in right knee    burnt the nerves  Left 04/22/2018   left knee nerve    COLONOSCOPY  12/2018   [place]- 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   cystoscopy kidney stone   1985   HYSTERECTOMY ABDOMINAL WITH SALPINGO-OOPHORECTOMY  2019   IR CT HEAD LTD  07/18/2019   IR IMAGING GUIDED PORT INSERTION  06/15/2018   IR PERCUTANEOUS ART THROMBECTOMY/INFUSION  INTRACRANIAL INC DIAG ANGIO  07/18/2019   IR REMOVAL TUN ACCESS W/ PORT W/O FL MOD SED  12/01/2018   LOOP RECORDER INSERTION N/A 07/19/2019   Procedure: LOOP RECORDER INSERTION;  Surgeon: Constance Haw, MD;  Location: [facility];  Service: Cardiovascular;  Laterality: N/A;   RADIOLOGY WITH ANESTHESIA N/A 07/17/2019   Procedure: IR WITH ANESTHESIA;  Surgeon: Luanne Bras, MD;  Location: [facility];  Service: Radiology;  Laterality: N/A;   REPLACEMENT TOTAL KNEE BILATERAL Bilateral 2018   Right x 2. 2019 undergoing intermittent injection "RFA" procedures with Dr. Sheliah Mends [facility]   REVISION TOTAL KNEE ARTHROPLASTY Right 03/2017   ROBOTIC ASSISTED TOTAL HYSTERECTOMY WITH BILATERAL SALPINGO OOPHERECTOMY N/A 05/03/2018   Procedure: XI ROBOTIC ASSISTED TOTAL HYSTERECTOMY WITH BILATERAL SALPINGO OOPHORECTOMY;  Surgeon: Everitt Amber, MD;  [facility];  Service: Gynecology;  Laterality: N/A;   SENTINEL NODE BIOPSY N/A 05/03/2018   Procedure: SENTINEL NODE BIOPSY;  Surgeon: Everitt Amber, MD;  [facility];  Service: Gynecology;  Laterality: N/A;   TONSILLECTOMY AND ADENOIDECTOMY     TUBAL LIGATION      There were no vitals filed for  this visit.   Subjective Assessment - 02/25/21 1404     Subjective Patient reports that she feels great right now, "you wouldn't know I had a problem" but still has the pain at night.  After last session she felt good, but the following night was miserable and had to take flexeril.  Due for kenalog shot in R hip next Tuesday.    Pertinent History history of cancer, chemotherapy,    Limitations Reading    How long can you sit comfortably? 30 - rolls blanket behind neck    How long can you stand comfortably? no limitations    How long can you walk comfortably? no difficulty    Diagnostic tests MR brain w/wo 12/29/2020 showed chronic left temporal and left insular ischemic infarcts but no evidence of acute findings. MR cervical 12/29/2020  degenerative changes with mild narrowing C4, C5 and C6 and severe left foraminal stenosis at C3-4 and severe bilateral foraminal stenosis C4-5.    Patient Stated Goals no more neck pain and no more tingling    Currently in Pain? No/denies    Pain Location Neck    Pain Orientation Right                [facility] PT Assessment - 02/25/21 0001       Observation/Other Assessments   Focus on Therapeutic Outcomes (FOTO)  51 (-7 from IE)                           [facility] Adult PT Treatment/Exercise - 02/25/21 0001       Exercises   Exercises Neck      Neck Exercises: Machines for Strengthening   UBE (Upper Arm Bike) L1 x 6 min (3 min foward/3 min retro)      Neck Exercises: Supine   Neck Retraction 5 reps;5 secs    Neck Retraction Limitations tactile cues needed    Cervical Rotation Both;10 reps    Cervical Rotation Limitations with chin tuck      Neck Exercises: Prone   Neck Retraction 10 reps    Neck Retraction Limitations tactile cues needed    Rows Other (comment)   not tolerated     Modalities   Modalities Moist Heat      Moist Heat Therapy   Number Minutes Moist Heat 10 Minutes    Moist Heat Location Cervical      Manual Therapy   Manual Therapy Joint mobilization;Soft tissue mobilization;Myofascial release    Manual therapy comments to cervical spine    Joint Mobilization PA mobs to improve extension, NAGS into rotation bil    Soft tissue mobilization STM to cervical paraspinals, R scalenes and SCM, L UT    Manual Traction to cervical region, reported decreased pain                    PT Education - 02/25/21 1542     Education Details progressed HEP    Person(s) Educated Patient    Methods Explanation;Handout;Demonstration;Tactile cues;Verbal cues    Comprehension Verbalized understanding;Returned demonstration;Tactile cues required;Verbal cues required              PT Short Term Goals - 01/30/21 1502       PT SHORT TERM GOAL  #1   Title Pt. will be compliant with initial HEP to improve cervical ROM to decrease neck pain    Time 2    Period Weeks    Status  Achieved    Target Date 02/05/21               PT Long Term Goals - 02/19/21 1628       PT LONG TERM GOAL #1   Title Pt. will report 50% improvement in occurance of tingling in R side of neck/face/shoulder    Baseline occurs intermittantly thorughout the day, 3x during evaluation    Time 6    Period Weeks    Status Partially Met   30% improvement, less frequent, no tingling in morning, not as much in face, just in jaw and side of neck   Target Date 03/26/21      PT LONG TERM GOAL #2   Title Pt. will report no more than 3/10 neck pain at rest at end of day.    Baseline 5/10 neck pain in morning increasing to 9/10 by end of day    Time 6    Period Weeks    Status Partially Met   progress, no pain in morning, 5/10 in evening.   Target Date 03/26/21      PT LONG TERM GOAL #3   Title Pt. will demonstrate improved cervical ROM by at least 15 deg all directions to perform ADLs    Baseline limited cervical flexion/extension/sidbending to 20 deg, R rotation only 35 deg, L rotation 50 deg, pain with all movements.    Time 6    Period Weeks    Status Partially Met   50 deg rotation bil, 30 deg extension, 30 deg flexion, 20 deg L SB, 25 deg R SB   Target Date 03/26/21      PT LONG TERM GOAL #4   Title Pt. will be able to lay on stomach to read for 15 minutes without increased neck pain.    Baseline unable to read in this position, favorite position    Time 6    Period Weeks    Status Not Met    Target Date 03/26/21                   Plan - 02/25/21 1531     Clinical Impression Statement Patient continues to report pain/tingling primarily at end of day.  Tried to advance exercises today with prone retraction, tolerated poorly due to back/hip pain.  Still needed a lot of cuing to perform chin tucks in supine correctly.  To focus on chin  tuck for isometric strengthening and chin tuck with rotation, given new copy HEP.  While in sitting she still reports difficulty with neck extension, noted position of comfort when in supine.  did not perform dry needling today, focused on manual therapy, noted tightness in bil SCM and decreased mobility cervical spine still.  MHP at end of session to decrease muscle spasm/pain from exercise. Pt. would benefit from continued skilled therapy.    Personal Factors and Comorbidities Age;Comorbidity 3+    Comorbidities history of CVA, cancer, chemotherapy and deconditioning, arthritis, bil TKR, smoker    Examination-Activity Limitations Bend;Lift;Sleep;Sit;Hygiene/Grooming    Examination-Participation Restrictions Interpersonal Relationship;Driving;Community Activity;Other    Stability/Clinical Decision Making Unstable/Unpredictable    Rehab Potential Good    PT Frequency 2x / week    PT Duration 6 weeks    PT Treatment/Interventions Cryotherapy;Moist Heat;Traction;Therapeutic activities;Therapeutic exercise;Neuromuscular re-education;Manual techniques;Passive range of motion;Dry needling;Taping;Spinal Manipulations;Joint Manipulations    PT Next Visit Plan Continue postural strengthening, manual therapy/modalities PRN.   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   Middle cerebral artery embolism, left  07/18/2019   SAH (subarachnoid hemorrhage) ([facility])    Mixed hyperlipidemia    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, embolic 32/44/0102   Yeast infection 11/15/2018   Preventive measure 10/28/2018   Gout attack 08/08/2018   Dysuria 08/01/2018   Elevated liver enzymes 08/01/2018   Chemotherapy-induced nausea 07/14/2018   Other constipation 07/14/2018   Physical debility 07/08/2018   Port-A-Cath in place 07/01/2018   Essential hypertension 07/01/2018   MSH6-related Lynch syndrome (HNPCC5) 07/01/2018   Arthritis 07/01/2018   Leukopenia due to antineoplastic chemotherapy ([place]) 06/26/2018   Diarrhea 06/26/2018   ARF (acute renal failure) ([place]) 06/25/2018   Diabetes mellitus without complication ([place]) 72/53/6644   Genetic testing 05/30/2018   Family history of colon cancer    Family history of genetic mutation for hereditary nonpolyposis colorectal cancer (HNPCC)    Endometrial cancer ([place]) 05/03/2018    Rennie Natter PT, DPT 02/25/2021, 3:44 PM  [facility] [address]  [place], [place], [address] Phone: [phone]   Fax:  [phone]  Name: Bevelyn Arriola MRN: [id number] Date of Birth: May 26, 1951

## 2021-02-25 NOTE — Patient Instructions (Signed)
Access Code: [email or web address] URL: [email or web address] Date: 02/25/2021 Prepared by: Glenetta Hew  Exercises Supine Cervical Retraction with Towel - 1 x daily - 7 x weekly - 3 sets - 10 reps Supine Cervical Rotation AROM on Pillow - 1 x daily - 7 x weekly - 3 sets - 10 reps

## 2021-02-26 ENCOUNTER — Ambulatory Visit: Payer: Medicare Other | Admitting: Adult Health

## 2021-02-27 ENCOUNTER — Other Ambulatory Visit: Payer: Self-pay

## 2021-02-27 ENCOUNTER — Encounter: Payer: Self-pay | Admitting: Physical Therapy

## 2021-02-27 ENCOUNTER — Ambulatory Visit: Payer: Medicare Other | Admitting: Physical Therapy

## 2021-02-27 DIAGNOSIS — M542 Cervicalgia: Secondary | ICD-10-CM | POA: Diagnosis not present

## 2021-02-27 DIAGNOSIS — R252 Cramp and spasm: Secondary | ICD-10-CM

## 2021-02-27 DIAGNOSIS — R202 Paresthesia of skin: Secondary | ICD-10-CM

## 2021-02-27 DIAGNOSIS — R29898 Other symptoms and signs involving the musculoskeletal system: Secondary | ICD-10-CM

## 2021-02-27 NOTE — Therapy (Signed)
[facility] [address]  [place], [place], [address] Phone: [phone]   Fax:  [phone]  Physical Therapy Treatment  Patient Details  Name: Jordan Martinez MRN: [id number] Date of Birth: 02/04/1951 Referring Provider (PT): Frann Rider   Encounter Date: 02/27/2021   PT End of Session - 02/27/21 1731     Visit Number 12    Number of Visits 20    Date for PT Re-Evaluation 03/26/21    Authorization Type Medicare A&B, Aetna    Progress Note Due on Visit 10    PT Start Time 1402    PT Stop Time 1445    PT Time Calculation (min) 43 min    Activity Tolerance Patient tolerated treatment well    Behavior During Therapy [facility] for tasks assessed/performed             Past Medical History:  Diagnosis Date   Anxiety    on meds   Arthritis    on PRN meds   Cataract    bilateral cat. ext. with lens implant- in [place]   Diabetes mellitus without complication ([place])    diet controlled. States always has borderline HgbA1C   Endometrial cancer ([place])    endometrial CA-2019   Family history of colon cancer    daughter 88 colon CA   Family history of genetic mutation for hereditary nonpolyposis colorectal cancer (HNPCC)    GERD (gastroesophageal reflux disease)    on meds   High cholesterol    on meds   History of kidney stones    years    Hypertension    on meds   Neuropathy    Sensation of pressure in bladder area    Stroke ([place]) 07/17/2019   Uterine cramping     Past Surgical History:  Procedure Laterality Date   burnt the nerves  Right 04/29/2018   in right knee    burnt the nerves  Left 04/22/2018   left knee nerve    COLONOSCOPY  12/2018   [place]- 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   cystoscopy kidney stone   1985   HYSTERECTOMY ABDOMINAL WITH SALPINGO-OOPHORECTOMY  2019   IR CT HEAD LTD  07/18/2019   IR IMAGING GUIDED PORT INSERTION  06/15/2018   IR PERCUTANEOUS ART THROMBECTOMY/INFUSION  INTRACRANIAL INC DIAG ANGIO  07/18/2019   IR REMOVAL TUN ACCESS W/ PORT W/O FL MOD SED  12/01/2018   LOOP RECORDER INSERTION N/A 07/19/2019   Procedure: LOOP RECORDER INSERTION;  Surgeon: Constance Haw, MD;  Location: [place] [facility];  Service: Cardiovascular;  Laterality: N/A;   RADIOLOGY WITH ANESTHESIA N/A 07/17/2019   Procedure: IR WITH ANESTHESIA;  Surgeon: Luanne Bras, MD;  Location: [place];  Service: Radiology;  Laterality: N/A;   REPLACEMENT TOTAL KNEE BILATERAL Bilateral 2018   Right x 2. 2019 undergoing intermittent injection "RFA" procedures with Dr. Sheliah Mends [facility]   REVISION TOTAL KNEE ARTHROPLASTY Right 03/2017   ROBOTIC ASSISTED TOTAL HYSTERECTOMY WITH BILATERAL SALPINGO OOPHERECTOMY N/A 05/03/2018   Procedure: XI ROBOTIC ASSISTED TOTAL HYSTERECTOMY WITH BILATERAL SALPINGO OOPHORECTOMY;  Surgeon: Everitt Amber, MD;  Location: [facility];  Service: Gynecology;  Laterality: N/A;   SENTINEL NODE BIOPSY N/A 05/03/2018   Procedure: SENTINEL NODE BIOPSY;  Surgeon: Everitt Amber, MD;  Location: [facility];  Service: Gynecology;  Laterality: N/A;   TONSILLECTOMY AND ADENOIDECTOMY     TUBAL LIGATION      There were no vitals filed for  this visit.   Subjective Assessment - 02/27/21 1409     Subjective Patient reports doing well right now, pain still in the evenings.  No tingling right now, but no improvement overall in tingling over the last week.  Still much better than it was before started PT.    Pertinent History history of cancer, chemotherapy,    Limitations Reading    How long can you sit comfortably? 30 - rolls blanket behind neck    How long can you stand comfortably? no limitations    How long can you walk comfortably? no difficulty    Diagnostic tests MR brain w/wo 12/29/2020 showed chronic left temporal and left insular ischemic infarcts but no evidence of acute findings. 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    Patient Stated Goals no more neck pain and no more tingling    Currently in Pain? No/denies    Pain Location Neck    Pain Descriptors / Indicators Tightness;Sore                               [facility] Adult PT Treatment/Exercise - 02/27/21 0001       Exercises   Exercises Neck      Neck Exercises: Machines for Strengthening   UBE (Upper Arm Bike) L1 x 6 min (3 min foward/3 min retro)      Neck Exercises: Supine   Neck Retraction 10 reps;10 secs    Neck Retraction Limitations verbal cues for chin tuck    Cervical Rotation Both;10 reps    Cervical Rotation Limitations with chin tuck      Neck Exercises: Sidelying   Lateral Flexion Both;10 reps    Lateral Flexion Limitations limited ROM      Manual Therapy   Manual Therapy Manual Traction;Other (comment)    Manual therapy comments to cervical spine    Manual Traction to cervical region, reported decreased pain    Other Manual Therapy dry needling              Trigger Point Dry Needling - 02/27/21 0001     Consent Given? Yes    Education Handout Provided Previously provided    Muscles Treated Head and Neck Splenius capitus   Splenius cervicis, R side both   Splenius capitus Response Twitch reponse elicited;Palpable increased muscle length                    PT Short Term Goals - 01/30/21 1502       PT SHORT TERM GOAL #1   Title Pt. will be compliant with initial HEP to improve cervical ROM to decrease neck pain    Time 2    Period Weeks    Status Achieved    Target Date 02/05/21               PT Long Term Goals - 02/19/21 1628       PT LONG TERM GOAL #1   Title Pt. will report 50% improvement in occurance of tingling in R side of neck/face/shoulder    Baseline occurs intermittantly thorughout the day, 3x during evaluation    Time 6    Period Weeks    Status Partially Met   30% improvement, less  frequent, no tingling in morning, not as much in face, just in jaw  and side of neck   Target Date 03/26/21      PT LONG TERM GOAL #2   Title Pt. will report no more than 3/10 neck pain at rest at end of day.    Baseline 5/10 neck pain in morning increasing to 9/10 by end of day    Time 6    Period Weeks    Status Partially Met   progress, no pain in morning, 5/10 in evening.   Target Date 03/26/21      PT LONG TERM GOAL #3   Title Pt. will demonstrate improved cervical ROM by at least 15 deg all directions to perform ADLs    Baseline limited cervical flexion/extension/sidbending to 20 deg, R rotation only 35 deg, L rotation 50 deg, pain with all movements.    Time 6    Period Weeks    Status Partially Met   50 deg rotation bil, 30 deg extension, 30 deg flexion, 20 deg L SB, 25 deg R SB   Target Date 03/26/21      PT LONG TERM GOAL #4   Title Pt. will be able to lay on stomach to read for 15 minutes without increased neck pain.    Baseline unable to read in this position, favorite position    Time 6    Period Weeks    Status Not Met    Target Date 03/26/21                   Plan - 02/27/21 1732     Clinical Impression Statement Focus of today's skilled therapy was continued cervical strengthening and endurance.  Noted significant improvement in cervical retraction today although still needed some cuing for chin tuck.  More challenged with lateral strenghtening in sidelying.  Performed dry needling today to splenius capitus and cervicis, but also discussed since tingling has improved significantly, and she does not seem to be improving further, may reduce dry needling since this is very uncomfortable for her.  She would benefit from continued skilled therapy.    Personal Factors and Comorbidities Age;Comorbidity 3+    Comorbidities history of CVA, cancer, chemotherapy and deconditioning, arthritis, bil TKR, smoker    Examination-Activity Limitations  Bend;Lift;Sleep;Sit;Hygiene/Grooming    Examination-Participation Restrictions Interpersonal Relationship;Driving;Community Activity;Other    Stability/Clinical Decision Making Unstable/Unpredictable    Rehab Potential Good    PT Frequency 2x / week    PT Duration 6 weeks    PT Treatment/Interventions Cryotherapy;Moist Heat;Traction;Therapeutic activities;Therapeutic exercise;Neuromuscular re-education;Manual techniques;Passive range of motion;Dry needling;Taping;Spinal Manipulations;Joint Manipulations    PT Next Visit Plan Continue postural strengthening, manual therapy/modalities PRN.   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   Middle cerebral artery embolism, 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   SAH (subarachnoid hemorrhage) (HCC)    Mixed hyperlipidemia    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, embolic 23/53/6144   Yeast infection 11/15/2018  Preventive measure 10/28/2018   Gout attack 08/08/2018   Dysuria 08/01/2018   Elevated liver enzymes 08/01/2018   Chemotherapy-induced nausea 07/14/2018   Other constipation 07/14/2018   Physical debility 07/08/2018   Port-A-Cath in place 07/01/2018   Essential hypertension 07/01/2018   MSH6-related Lynch syndrome (HNPCC5) 07/01/2018   Arthritis 07/01/2018   Leukopenia due to antineoplastic chemotherapy ([place]) 06/26/2018   Diarrhea 06/26/2018    ARF (acute renal failure) ([place]) 06/25/2018   Diabetes mellitus without complication ([place]) 06/98/6148   Genetic testing 05/30/2018   Family history of colon cancer    Family history of genetic mutation for hereditary nonpolyposis colorectal cancer (HNPCC)    Endometrial cancer ([place]) 05/03/2018    Rennie Natter PT, DPT 02/27/2021, 5:35 PM  [facility] [address]  [place], [place], [address] Phone: [phone]   Fax:  [phone]  Name: Kamill Fulbright MRN: [id number] Date of Birth: Nov 12, 1950

## 2021-03-04 ENCOUNTER — Ambulatory Visit: Payer: Medicare Other | Admitting: Physical Therapy

## 2021-03-04 ENCOUNTER — Encounter: Payer: Self-pay | Admitting: Physical Therapy

## 2021-03-04 ENCOUNTER — Other Ambulatory Visit: Payer: Self-pay

## 2021-03-04 DIAGNOSIS — R202 Paresthesia of skin: Secondary | ICD-10-CM

## 2021-03-04 DIAGNOSIS — M542 Cervicalgia: Secondary | ICD-10-CM | POA: Diagnosis not present

## 2021-03-04 DIAGNOSIS — R252 Cramp and spasm: Secondary | ICD-10-CM

## 2021-03-04 DIAGNOSIS — R29898 Other symptoms and signs involving the musculoskeletal system: Secondary | ICD-10-CM

## 2021-03-04 NOTE — Progress Notes (Signed)
Carelink Summary Report / Loop Recorder 

## 2021-03-04 NOTE — Therapy (Addendum)
[facility] [address]  [place], [place], [address] Phone: [phone]   Fax:  [phone]  Physical Therapy Treatment  Patient Details  Name: Jordan Martinez MRN: [id number] Date of Birth: 04-29-1951 Referring Provider (PT): Frann Rider   Encounter Date: 03/04/2021   PT End of Session - 03/04/21 1543     Visit Number 13    Number of Visits 20    Date for PT Re-Evaluation 03/26/21    Authorization Type Medicare A&B, Aetna    Progress Note Due on Visit 10    PT Start Time 0201    PT Stop Time 0250    PT Time Calculation (min) 49 min    Activity Tolerance Patient tolerated treatment well;Patient limited by pain    Behavior During Therapy [facility] for tasks assessed/performed             Past Medical History:  Diagnosis Date   Anxiety    on meds   Arthritis    on PRN meds   Cataract    bilateral cat. ext. with lens implant- in [place]   Diabetes mellitus without complication ([place])    diet controlled. States always has borderline HgbA1C   Endometrial cancer ([place])    endometrial CA-2019   Family history of colon cancer    daughter 61 colon CA   Family history of genetic mutation for hereditary nonpolyposis colorectal cancer (HNPCC)    GERD (gastroesophageal reflux disease)    on meds   High cholesterol    on meds   History of kidney stones    years    Hypertension    on meds   Neuropathy    Sensation of pressure in bladder area    Stroke ([place]) 07/17/2019   Uterine cramping     Past Surgical History:  Procedure Laterality Date   burnt the nerves  Right 04/29/2018   in right knee    burnt the nerves  Left 04/22/2018   left knee nerve    COLONOSCOPY  12/2018   [place]- 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   cystoscopy kidney stone   1985   HYSTERECTOMY ABDOMINAL WITH SALPINGO-OOPHORECTOMY  2019   IR CT HEAD LTD  07/18/2019   IR IMAGING GUIDED PORT INSERTION  06/15/2018   IR PERCUTANEOUS ART  THROMBECTOMY/INFUSION INTRACRANIAL INC DIAG ANGIO  07/18/2019   IR REMOVAL TUN ACCESS W/ PORT W/O FL MOD SED  12/01/2018   LOOP RECORDER INSERTION N/A 07/19/2019   Procedure: LOOP RECORDER INSERTION;  Surgeon: Constance Haw, MD;  Location: [place] [facility];  Service: Cardiovascular;  Laterality: N/A;   RADIOLOGY WITH ANESTHESIA N/A 07/17/2019   Procedure: IR WITH ANESTHESIA;  Surgeon: Luanne Bras, MD;  Location: [place];  Service: Radiology;  Laterality: N/A;   REPLACEMENT TOTAL KNEE BILATERAL Bilateral 2018   Right x 2. 2019 undergoing intermittent injection "RFA" procedures with Dr. Sheliah Mends [facility]   REVISION TOTAL KNEE ARTHROPLASTY Right 03/2017   ROBOTIC ASSISTED TOTAL HYSTERECTOMY WITH BILATERAL SALPINGO OOPHERECTOMY N/A 05/03/2018   Procedure: XI ROBOTIC ASSISTED TOTAL HYSTERECTOMY WITH BILATERAL SALPINGO OOPHORECTOMY;  Surgeon: Everitt Amber, MD;  [facility];  Service: Gynecology;  Laterality: N/A;   SENTINEL NODE BIOPSY N/A 05/03/2018   Procedure: SENTINEL NODE BIOPSY;  Surgeon: Everitt Amber, MD;  [facility];  Service: Gynecology;  Laterality: N/A;   TONSILLECTOMY AND ADENOIDECTOMY     TUBAL LIGATION      There were no  vitals filed for this visit.   Subjective Assessment - 03/04/21 1403     Subjective Pt. reports that she had her R hip injected today under fluoroscopy.  Doing well.    Pertinent History history of cancer, chemotherapy,    Limitations Reading    How long can you sit comfortably? 30 - rolls blanket behind neck    How long can you stand comfortably? no limitations    How long can you walk comfortably? no difficulty    Diagnostic tests MR brain w/wo 12/29/2020 showed chronic left temporal and left insular ischemic infarcts but no evidence of acute findings. MR cervical 12/29/2020 degenerative changes with mild narrowing C4, C5 and C6 and severe left foraminal stenosis at C3-4 and severe bilateral foraminal stenosis C4-5.    Patient Stated  Goals no more neck pain and no more tingling    Currently in Pain? Yes    Pain Score 1     Pain Location Neck    Pain Orientation Right    Pain Descriptors / Indicators Aching;Tingling                               [facility] Adult PT Treatment/Exercise - 03/04/21 0001       Exercises   Exercises Neck      Neck Exercises: Machines for Strengthening   UBE (Upper Arm Bike) L1 x 6 min (3 min foward/3 min retro)      Neck Exercises: Sidelying   Other Sidelying Exercise open book stretch x 10      Modalities   Modalities Moist Heat      Moist Heat Therapy   Number Minutes Moist Heat 10 Minutes    Moist Heat Location Cervical      Manual Therapy   Manual Therapy Joint mobilization;Soft tissue mobilization;Myofascial release    Manual therapy comments to cervical spine    Joint Mobilization 1st rib mobs R    Soft tissue mobilization STM to R anterior scalenes, cervical paraspinals    Myofascial Release TPR to R UT    Manual Traction to cervical region, reported decreased pain                      PT Short Term Goals - 01/30/21 1502       PT SHORT TERM GOAL #1   Title Pt. will be compliant with initial HEP to improve cervical ROM to decrease neck pain    Time 2    Period Weeks    Status Achieved    Target Date 02/05/21               PT Long Term Goals - 02/19/21 1628       PT LONG TERM GOAL #1   Title Pt. will report 50% improvement in occurance of tingling in R side of neck/face/shoulder    Baseline occurs intermittantly thorughout the day, 3x during evaluation    Time 6    Period Weeks    Status Partially Met   30% improvement, less frequent, no tingling in morning, not as much in face, just in jaw and side of neck   Target Date 03/26/21      PT LONG TERM GOAL #2   Title Pt. will report no more than 3/10 neck pain at rest at end of day.    Baseline 5/10 neck pain in morning increasing to 9/10 by end of day  Time 6    Period  Weeks    Status Partially Met   progress, no pain in morning, 5/10 in evening.   Target Date 03/26/21      PT LONG TERM GOAL #3   Title Pt. will demonstrate improved cervical ROM by at least 15 deg all directions to perform ADLs    Baseline limited cervical flexion/extension/sidbending to 20 deg, R rotation only 35 deg, L rotation 50 deg, pain with all movements.    Time 6    Period Weeks    Status Partially Met   50 deg rotation bil, 30 deg extension, 30 deg flexion, 20 deg L SB, 25 deg R SB   Target Date 03/26/21      PT LONG TERM GOAL #4   Title Pt. will be able to lay on stomach to read for 15 minutes without increased neck pain.    Baseline unable to read in this position, favorite position    Time 6    Period Weeks    Status Not Met    Target Date 03/26/21              Plan - 03/04/21 1543            Clinical Impression Statement : Pt. continues to report pain and tingling in neck at night.  Tried to progress exercises today for lateral strengthing in sidelying, but very poor tolerance to this position, unable to tolerate at all laying on L side, with immediate tingling beginning on R side of neck again.  After repositioning to R side, able to tolerate this position but not lifting neck, was able to do some open book exercises to improve thoracic extension.  Remainder of session performed manual therapy to decrease muscle spasm and pain followed by [facility].    Personal Factors and Comorbidities Age;Comorbidity 3+     Comorbidities history of CVA, cancer, chemotherapy and deconditioning, arthritis, bil TKR, smoker     Examination-Activity Limitations Bend;Lift;Sleep;Sit;Hygiene/Grooming     Examination-Participation Restrictions Interpersonal Relationship;Driving;Community Activity;Other     Stability/Clinical Decision Making Unstable/Unpredictable     Rehab Potential Good     PT Frequency 2x / week     PT Duration 6 weeks     PT Treatment/Interventions Cryotherapy;Moist  Heat;Traction;Therapeutic activities;Therapeutic exercise;Neuromuscular re-education;Manual techniques;Passive range of motion;Dry needling;Taping;Spinal Manipulations;Joint Manipulations     PT Next Visit Plan Continue postural strengthening, manual therapy/modalities PRN.   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   Middle cerebral artery embolism, 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   SAH (subarachnoid hemorrhage) (HCC)    Mixed hyperlipidemia    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, embolic 07/17/2019   Yeast infection 11/15/2018   Preventive measure 10/28/2018   Gout attack 08/08/2018   Dysuria 08/01/2018   Elevated liver enzymes 08/01/2018   Chemotherapy-induced nausea 07/14/2018   Other constipation 07/14/2018  Physical debility 07/08/2018   Port-A-Cath in place 07/01/2018   Essential hypertension 07/01/2018   MSH6-related Lynch syndrome (HNPCC5) 07/01/2018   Arthritis 07/01/2018   Leukopenia due to antineoplastic chemotherapy ([place]) 06/26/2018   Diarrhea 06/26/2018   ARF (acute renal failure) ([place]) 06/25/2018   Diabetes mellitus without complication ([place]) 82/88/3374   Genetic testing 05/30/2018   Family history of colon cancer    Family history of genetic mutation for hereditary nonpolyposis colorectal cancer (HNPCC)    Endometrial cancer ([place]) 05/03/2018    Rennie Natter  PT, DPT 03/06/2021, 5:44 PM  [facility] [address]  [place], [place], [address] Phone: [phone]   Fax:  [phone]  Name: Kamela Blansett MRN: [id number] Date of Birth: [birth year]

## 2021-03-06 ENCOUNTER — Other Ambulatory Visit: Payer: Self-pay

## 2021-03-06 ENCOUNTER — Ambulatory Visit: Payer: Medicare Other | Attending: Adult Health | Admitting: Physical Therapy

## 2021-03-06 ENCOUNTER — Encounter: Payer: Self-pay | Admitting: Physical Therapy

## 2021-03-06 DIAGNOSIS — R252 Cramp and spasm: Secondary | ICD-10-CM | POA: Diagnosis present

## 2021-03-06 DIAGNOSIS — M542 Cervicalgia: Secondary | ICD-10-CM | POA: Insufficient documentation

## 2021-03-06 DIAGNOSIS — R202 Paresthesia of skin: Secondary | ICD-10-CM | POA: Diagnosis present

## 2021-03-06 DIAGNOSIS — R29898 Other symptoms and signs involving the musculoskeletal system: Secondary | ICD-10-CM | POA: Diagnosis present

## 2021-03-06 NOTE — Therapy (Signed)
[facility] [address]  [place], [place], [address] Phone: [phone]   Fax:  [phone]  Physical Therapy Treatment  Patient Details  Name: Jordan Martinez MRN: [id number] Date of Birth: 1951/06/21 Referring Provider (PT): Frann Rider   Encounter Date: 03/06/2021   PT End of Session - 03/06/21 1429     Visit Number 14    Number of Visits 20    Date for PT Re-Evaluation 03/26/21    Authorization Type Medicare A&B, Aetna    Progress Note Due on Visit 20    PT Start Time 0202    PT Stop Time 0255    PT Time Calculation (min) 53 min    Activity Tolerance Patient tolerated treatment well    Behavior During Therapy [facility] for tasks assessed/performed             Past Medical History:  Diagnosis Date   Anxiety    on meds   Arthritis    on PRN meds   Cataract    bilateral cat. ext. with lens implant- in [place]   Diabetes mellitus without complication ([place])    diet controlled. States always has borderline HgbA1C   Endometrial cancer ([place])    endometrial CA-2019   Family history of colon cancer    daughter 40 colon CA   Family history of genetic mutation for hereditary nonpolyposis colorectal cancer (HNPCC)    GERD (gastroesophageal reflux disease)    on meds   High cholesterol    on meds   History of kidney stones    years    Hypertension    on meds   Neuropathy    Sensation of pressure in bladder area    Stroke ([place]) 07/17/2019   Uterine cramping     Past Surgical History:  Procedure Laterality Date   burnt the nerves  Right 04/29/2018   in right knee    burnt the nerves  Left 04/22/2018   left knee nerve    COLONOSCOPY  12/2018   [place]- 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   cystoscopy kidney stone   1985   HYSTERECTOMY ABDOMINAL WITH SALPINGO-OOPHORECTOMY  2019   IR CT HEAD LTD  07/18/2019   IR IMAGING GUIDED PORT INSERTION  06/15/2018   IR PERCUTANEOUS ART THROMBECTOMY/INFUSION  INTRACRANIAL INC DIAG ANGIO  07/18/2019   IR REMOVAL TUN ACCESS W/ PORT W/O FL MOD SED  12/01/2018   LOOP RECORDER INSERTION N/A 07/19/2019   Procedure: LOOP RECORDER INSERTION;  Surgeon: Constance Haw, MD;  Location: [facility];  Service: Cardiovascular;  Laterality: N/A;   RADIOLOGY WITH ANESTHESIA N/A 07/17/2019   Procedure: IR WITH ANESTHESIA;  Surgeon: Luanne Bras, MD;  Location: [facility];  Service: Radiology;  Laterality: N/A;   REPLACEMENT TOTAL KNEE BILATERAL Bilateral 2018   Right x 2. 2019 undergoing intermittent injection "RFA" procedures with Dr. Sheliah Mends [facility]   REVISION TOTAL KNEE ARTHROPLASTY Right 03/2017   ROBOTIC ASSISTED TOTAL HYSTERECTOMY WITH BILATERAL SALPINGO OOPHERECTOMY N/A 05/03/2018   Procedure: XI ROBOTIC ASSISTED TOTAL HYSTERECTOMY WITH BILATERAL SALPINGO OOPHORECTOMY;  Surgeon: Everitt Amber, MD;  Location: [facility];  Service: Gynecology;  Laterality: N/A;   SENTINEL NODE BIOPSY N/A 05/03/2018   Procedure: SENTINEL NODE BIOPSY;  Surgeon: Everitt Amber, MD;  Location: [facility];  Service: Gynecology;  Laterality: N/A;   TONSILLECTOMY AND ADENOIDECTOMY     TUBAL LIGATION      There were no vitals filed for  this visit.   Subjective Assessment - 03/06/21 1409     Subjective Pt. reports that she had her R hip injected today under fluoroscopy.  Doing well.    Pertinent History history of cancer, chemotherapy,    Limitations Reading    How long can you sit comfortably? 30 - rolls blanket behind neck    How long can you stand comfortably? no limitations    How long can you walk comfortably? no difficulty    Diagnostic tests MR brain w/wo 12/29/2020 showed chronic left temporal and left insular ischemic infarcts but no evidence of acute findings. MR cervical 12/29/2020 degenerative changes with mild narrowing C4, C5 and C6 and severe left foraminal stenosis at C3-4 and severe bilateral foraminal stenosis C4-5.    Patient Stated Goals no more neck pain  and no more tingling                               [facility] Adult PT Treatment/Exercise - 03/06/21 0001       Exercises   Exercises Neck      Neck Exercises: Machines for Strengthening   UBE (Upper Arm Bike) L1 x 6 min (3 min foward/3 min retro)      Neck Exercises: Supine   Neck Retraction Limitations reviewed    Cervical Rotation Limitations reviewed      Modalities   Modalities Moist Heat;Traction      Moist Heat Therapy   Number Minutes Moist Heat 10 Minutes    Moist Heat Location Cervical      Traction   Type of Traction Cervical    Max (lbs) 18    Hold Time 5 min    Rest Time 1 min    Time 18 min + education     Manual Therapy   Manual Therapy Soft tissue mobilization    Manual therapy comments to cervical spine    Soft tissue mobilization STM to cervical paraspinals                      PT Short Term Goals - 01/30/21 1502       PT SHORT TERM GOAL #1   Title Pt. will be compliant with initial HEP to improve cervical ROM to decrease neck pain    Time 2    Period Weeks    Status Achieved    Target Date 02/05/21               PT Long Term Goals - 02/19/21 1628       PT LONG TERM GOAL #1   Title Pt. will report 50% improvement in occurance of tingling in R side of neck/face/shoulder    Baseline occurs intermittantly thorughout the day, 3x during evaluation    Time 6    Period Weeks    Status Partially Met   30% improvement, less frequent, no tingling in morning, not as much in face, just in jaw and side of neck   Target Date 03/26/21      PT LONG TERM GOAL #2   Title Pt. will report no more than 3/10 neck pain at rest at end of day.    Baseline 5/10 neck pain in morning increasing to 9/10 by end of day    Time 6    Period Weeks    Status Partially Met   progress, no pain in morning, 5/10 in evening.   Target Date  03/26/21      PT LONG TERM GOAL #3   Title Pt. will demonstrate improved cervical ROM by at least 15  deg all directions to perform ADLs    Baseline limited cervical flexion/extension/sidbending to 20 deg, R rotation only 35 deg, L rotation 50 deg, pain with all movements.    Time 6    Period Weeks    Status Partially Met   50 deg rotation bil, 30 deg extension, 30 deg flexion, 20 deg L SB, 25 deg R SB   Target Date 03/26/21      PT LONG TERM GOAL #4   Title Pt. will be able to lay on stomach to read for 15 minutes without increased neck pain.    Baseline unable to read in this position, favorite position    Time 6    Period Weeks    Status Not Met    Target Date 03/26/21                   Plan - 03/06/21 1431     Clinical Impression Statement Patient continues to report pain and tingling in the evening, but when she wakes up in the morning she has neither.  Today trialed cervical traction using Sauder's unit, pumped to 18# held for 5 min x 3 rounds.  She did report some tenderness at occiput where unit held head, but this was tolerable.  No tingling or other adverse effects noted.  Will see how she feels later if this had benefit, can explore getting home unit.  Pt. would benefit from continued skilled intervention.    Personal Factors and Comorbidities Age;Comorbidity 3+    Comorbidities history of CVA, cancer, chemotherapy and deconditioning, arthritis, bil TKR, smoker    Examination-Activity Limitations Bend;Lift;Sleep;Sit;Hygiene/Grooming    Examination-Participation Restrictions Interpersonal Relationship;Driving;Community Activity;Other    Stability/Clinical Decision Making Unstable/Unpredictable    Rehab Potential Good    PT Frequency 2x / week    PT Duration 6 weeks    PT Treatment/Interventions Cryotherapy;Moist Heat;Traction;Therapeutic activities;Therapeutic exercise;Neuromuscular re-education;Manual techniques;Passive range of motion;Dry needling;Taping;Spinal Manipulations;Joint Manipulations    PT Next Visit Plan Continue postural strengthening, manual  therapy/modalities PRN.   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   Middle cerebral artery embolism, 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   SAH (subarachnoid hemorrhage) ([place])    Mixed hyperlipidemia    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, embolic 60/15/6153   Yeast infection 11/15/2018   Preventive measure 10/28/2018   Gout attack 08/08/2018   Dysuria 08/01/2018   Elevated liver enzymes 08/01/2018   Chemotherapy-induced nausea 07/14/2018   Other constipation 07/14/2018   Physical debility 07/08/2018   Port-A-Cath in place 07/01/2018   Essential hypertension 07/01/2018   MSH6-related Lynch syndrome (HNPCC5) 07/01/2018   Arthritis 07/01/2018   Leukopenia due to antineoplastic chemotherapy ([place]) 06/26/2018   Diarrhea 06/26/2018   ARF (acute renal failure) (  [place]) 06/25/2018   Diabetes mellitus without complication ([place]) 65/68/1275   Genetic testing 05/30/2018   Family history of colon cancer    Family history of genetic mutation for hereditary nonpolyposis colorectal cancer (HNPCC)    Endometrial cancer ([place]) 05/03/2018    Rennie Natter PT, DPT 03/06/2021, 6:10 PM  [facility] [address]  [place], [place], [address] Phone: [phone]   Fax:  [phone]  Name: Kenijah Benningfield MRN: [id number] Date of Birth:  06-21-1951

## 2021-03-11 ENCOUNTER — Ambulatory Visit: Payer: Medicare Other | Admitting: Physical Therapy

## 2021-03-11 ENCOUNTER — Encounter: Payer: Self-pay | Admitting: Physical Therapy

## 2021-03-11 ENCOUNTER — Other Ambulatory Visit: Payer: Self-pay

## 2021-03-11 DIAGNOSIS — R202 Paresthesia of skin: Secondary | ICD-10-CM

## 2021-03-11 DIAGNOSIS — R29898 Other symptoms and signs involving the musculoskeletal system: Secondary | ICD-10-CM

## 2021-03-11 DIAGNOSIS — R252 Cramp and spasm: Secondary | ICD-10-CM

## 2021-03-11 DIAGNOSIS — M542 Cervicalgia: Secondary | ICD-10-CM

## 2021-03-11 NOTE — Therapy (Signed)
[facility] [address]  [place], [place], [address] Phone: [phone]   Fax:  [phone]  Physical Therapy Treatment  Patient Details  Name: Jordan Martinez MRN: [id number] Date of Birth: [birth year] Referring Provider (PT): Frann Rider   Encounter Date: 03/11/2021   PT End of Session - 03/11/21 0936     Visit Number 15    Number of Visits 20    Date for PT Re-Evaluation 03/26/21    Authorization Type Medicare A&B, Aetna    Progress Note Due on Visit 20    PT Start Time 0936    PT Stop Time 1025    PT Time Calculation (min) 49 min    Activity Tolerance Patient tolerated treatment well    Behavior During Therapy [facility] for tasks assessed/performed             Past Medical History:  Diagnosis Date   Anxiety    on meds   Arthritis    on PRN meds   Cataract    bilateral cat. ext. with lens implant- in [place]   Diabetes mellitus without complication ([place])    diet controlled. States always has borderline HgbA1C   Endometrial cancer ([place])    endometrial CA-2019   Family history of colon cancer    daughter 67 colon CA   Family history of genetic mutation for hereditary nonpolyposis colorectal cancer (HNPCC)    GERD (gastroesophageal reflux disease)    on meds   High cholesterol    on meds   History of kidney stones    years    Hypertension    on meds   Neuropathy    Sensation of pressure in bladder area    Stroke ([place]) 07/17/2019   Uterine cramping     Past Surgical History:  Procedure Laterality Date   burnt the nerves  Right 04/29/2018   in right knee    burnt the nerves  Left 04/22/2018   left knee nerve    COLONOSCOPY  12/2018   [place]- 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   cystoscopy kidney stone   1985   HYSTERECTOMY ABDOMINAL WITH SALPINGO-OOPHORECTOMY  2019   IR CT HEAD LTD  07/18/2019   IR IMAGING GUIDED PORT INSERTION  06/15/2018   IR PERCUTANEOUS ART THROMBECTOMY/INFUSION  INTRACRANIAL INC DIAG ANGIO  07/18/2019   IR REMOVAL TUN ACCESS W/ PORT W/O FL MOD SED  12/01/2018   LOOP RECORDER INSERTION N/A 07/19/2019   Procedure: LOOP RECORDER INSERTION;  Surgeon: Constance Haw, MD;  Location: [facility];  Service: Cardiovascular;  Laterality: N/A;   RADIOLOGY WITH ANESTHESIA N/A 07/17/2019   Procedure: IR WITH ANESTHESIA;  Surgeon: Luanne Bras, MD;  Location: [place];  Service: Radiology;  Laterality: N/A;   REPLACEMENT TOTAL KNEE BILATERAL Bilateral 2018   Right x 2. 2019 undergoing intermittent injection "RFA" procedures with Dr. Sheliah Mends [facility]   REVISION TOTAL KNEE ARTHROPLASTY Right 03/2017   ROBOTIC ASSISTED TOTAL HYSTERECTOMY WITH BILATERAL SALPINGO OOPHERECTOMY N/A 05/03/2018   Procedure: XI ROBOTIC ASSISTED TOTAL HYSTERECTOMY WITH BILATERAL SALPINGO OOPHORECTOMY;  Surgeon: Everitt Amber, MD;  [facility];  Service: Gynecology;  Laterality: N/A;   SENTINEL NODE BIOPSY N/A 05/03/2018   Procedure: SENTINEL NODE BIOPSY;  Surgeon: Everitt Amber, MD;  Location[facility];  Service: Gynecology;  Laterality: N/A;   TONSILLECTOMY AND ADENOIDECTOMY     TUBAL LIGATION      There were no vitals filed for  this visit.   Subjective Assessment - 03/11/21 0936     Subjective Pt. reports one day her neck was really bad getting out of bed, had sudden sharp blood curdling pain, but yesterday pain wasn't bad at all, and the tingling wast bad, saturday tingling was bad in the evening but yesterday not bad at all.  does not recall any benefit from traction, or it may have been the reason she had that severe pain.  she also reports some incidents of having difficulty all of a sudden recalling things like how to pump gas, and put checks into recycling.  Does not think they were TIAs, recommended she talk to neurologist about them though.    Pertinent History history of cancer, chemotherapy,    Limitations Reading    How long can you sit comfortably? 30 - rolls  blanket behind neck    How long can you stand comfortably? no limitations    How long can you walk comfortably? no difficulty    Diagnostic tests MR brain w/wo 12/29/2020 showed chronic left temporal and left insular ischemic infarcts but no evidence of acute findings. MR cervical 12/29/2020 degenerative changes with mild narrowing C4, C5 and C6 and severe left foraminal stenosis at C3-4 and severe bilateral foraminal stenosis C4-5.    Patient Stated Goals no more neck pain and no more tingling    Currently in Pain? No/denies    Pain Location Neck    Pain Orientation Right                               [facility] Adult PT Treatment/Exercise - 03/11/21 0001       Exercises   Exercises Neck      Neck Exercises: Machines for Strengthening   UBE (Upper Arm Bike) L1 x 6 min (3 min foward/3 min retro)      Neck Exercises: Supine   Neck Retraction 10 reps    Cervical Rotation Both;5 reps      Modalities   Modalities Moist Heat;Traction      Moist Heat Therapy   Number Minutes Moist Heat 10 Minutes    Moist Heat Location Cervical      Manual Therapy   Manual Therapy Soft tissue mobilization;Joint mobilization    Manual therapy comments to cervical spine    Joint Mobilization 1st rib mobs R (tender), Grade 3-4 PA mobs C2-C6, NAGS into rotations, gapping    Soft tissue mobilization STM to cervical paraspinals                      PT Short Term Goals - 01/30/21 1502       PT SHORT TERM GOAL #1   Title Pt. will be compliant with initial HEP to improve cervical ROM to decrease neck pain    Time 2    Period Weeks    Status Achieved    Target Date 02/05/21               PT Long Term Goals - 02/19/21 1628       PT LONG TERM GOAL #1   Title Pt. will report 50% improvement in occurance of tingling in R side of neck/face/shoulder    Baseline occurs intermittantly thorughout the day, 3x during evaluation    Time 6    Period Weeks    Status  Partially Met   30% improvement, less frequent, no tingling in morning, not  as much in face, just in jaw and side of neck   Target Date 03/26/21      PT LONG TERM GOAL #2   Title Pt. will report no more than 3/10 neck pain at rest at end of day.    Baseline 5/10 neck pain in morning increasing to 9/10 by end of day    Time 6    Period Weeks    Status Partially Met   progress, no pain in morning, 5/10 in evening.   Target Date 03/26/21      PT LONG TERM GOAL #3   Title Pt. will demonstrate improved cervical ROM by at least 15 deg all directions to perform ADLs    Baseline limited cervical flexion/extension/sidbending to 20 deg, R rotation only 35 deg, L rotation 50 deg, pain with all movements.    Time 6    Period Weeks    Status Partially Met   50 deg rotation bil, 30 deg extension, 30 deg flexion, 20 deg L SB, 25 deg R SB   Target Date 03/26/21      PT LONG TERM GOAL #4   Title Pt. will be able to lay on stomach to read for 15 minutes without increased neck pain.    Baseline unable to read in this position, favorite position    Time 6    Period Weeks    Status Not Met    Target Date 03/26/21                   Plan - 03/11/21 1030     Clinical Impression Statement Patient did not report any benefit from traction.  Discussed progress and chiropractic care for HVLA manips, but not comfortable with the idea at this time.  We also discussed starting more prone exercises and building tolerance for this position, starting at 1 min rather than several book chapters, which causes a lot of pain.  Focus of todays sesison was on manual therapy to improve cervical ROM and decrease muscle tightness, noted still has tender points in R anterior scalenes and L cervical paraspinals with NAGS.  Pt. would benefit from continued skilled therapy.    Personal Factors and Comorbidities Age;Comorbidity 3+    Comorbidities history of CVA, cancer, chemotherapy and deconditioning, arthritis, bil TKR,  smoker    Examination-Activity Limitations Bend;Lift;Sleep;Sit;Hygiene/Grooming    Examination-Participation Restrictions Interpersonal Relationship;Driving;Community Activity;Other    Stability/Clinical Decision Making Unstable/Unpredictable    Rehab Potential Good    PT Frequency 2x / week    PT Duration 6 weeks    PT Treatment/Interventions Cryotherapy;Moist Heat;Traction;Therapeutic activities;Therapeutic exercise;Neuromuscular re-education;Manual techniques;Passive range of motion;Dry needling;Taping;Spinal Manipulations;Joint Manipulations    PT Next Visit Plan Continue postural strengthening, manual therapy/modalities PRN.   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   Middle cerebral artery embolism, 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   SAH (subarachnoid hemorrhage) ([facility])    Mixed  hyperlipidemia    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, embolic 84/53/6468   Yeast infection 11/15/2018   Preventive measure 10/28/2018   Gout attack 08/08/2018   Dysuria 08/01/2018   Elevated liver enzymes 08/01/2018   Chemotherapy-induced nausea 07/14/2018   Other constipation 07/14/2018   Physical debility 07/08/2018   Port-A-Cath in place 07/01/2018   Essential hypertension 07/01/2018   MSH6-related Lynch syndrome (HNPCC5) 07/01/2018   Arthritis 07/01/2018   Leukopenia due to antineoplastic chemotherapy  ([place]) 06/26/2018   Diarrhea 06/26/2018   ARF (acute renal failure) ([place]) 06/25/2018   Diabetes mellitus without complication ([place]) 09/24/2246   Genetic testing 05/30/2018   Family history of colon cancer    Family history of genetic mutation for hereditary nonpolyposis colorectal cancer (HNPCC)    Endometrial cancer ([place]) 05/03/2018    Rennie Natter PT, DPT 03/11/2021, 10:35 AM  [facility] [address]  [place], [place], [address] Phone: [phone]   Fax:  [phone]  Name: Camrin Lapre MRN: [id number] Date of Birth: Nov 11, 1950

## 2021-03-13 ENCOUNTER — Encounter: Payer: Self-pay | Admitting: Physical Therapy

## 2021-03-13 ENCOUNTER — Ambulatory Visit: Payer: Medicare Other | Admitting: Physical Therapy

## 2021-03-13 ENCOUNTER — Other Ambulatory Visit: Payer: Self-pay

## 2021-03-13 DIAGNOSIS — M542 Cervicalgia: Secondary | ICD-10-CM

## 2021-03-13 DIAGNOSIS — R29898 Other symptoms and signs involving the musculoskeletal system: Secondary | ICD-10-CM

## 2021-03-13 DIAGNOSIS — R252 Cramp and spasm: Secondary | ICD-10-CM

## 2021-03-13 DIAGNOSIS — R202 Paresthesia of skin: Secondary | ICD-10-CM

## 2021-03-13 NOTE — Therapy (Signed)
[facility] [address]  [place], [place], [address] Phone: [phone]   Fax:  [phone]  Physical Therapy Treatment  Patient Details  Name: Jordan Martinez MRN: [id number] Date of Birth: 29-Apr-1951 Referring Provider (PT): Frann Rider   Encounter Date: 03/13/2021   PT End of Session - 03/13/21 1423     Visit Number 16    Number of Visits 20    Date for PT Re-Evaluation 03/26/21    Authorization Type Medicare A&B, [employer]et[employer]    Progress Note Due on Visit 20    PT Start Time 1402    PT Stop Time 1450    PT Time Calculation (min) 48 min    Activity Tolerance Patient limited by pain    Behavior During Therapy [facility] for tasks assessed/performed             Past Medical History:  Diagnosis Date   Anxiety    on meds   Arthritis    on PRN meds   Cataract    bilateral cat. ext. with lens implant- in [place]   Diabetes mellitus without complication ([place])    diet controlled. States always has borderline HgbA1C   Endometrial cancer ([place])    endometrial CA-2019   Family history of colon cancer    daughter 57 colon CA   Family history of genetic mutation for hereditary nonpolyposis colorectal cancer (HNPCC)    GERD (gastroesophageal reflux disease)    on meds   High cholesterol    on meds   History of kidney stones    years    Hypertension    on meds   Neuropathy    Sensation of pressure in bladder area    Stroke ([place]) 07/17/2019   Uterine cramping     Past Surgical History:  Procedure Laterality Date   burnt the nerves  Right 04/29/2018   in right knee    burnt the nerves  Left 04/22/2018   left knee nerve    COLONOSCOPY  12/2018   [place]- 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   cystoscopy kidney stone   1985   HYSTERECTOMY ABDOMINAL WITH SALPINGO-OOPHORECTOMY  2019   IR CT HEAD LTD  07/18/2019   IR IMAGING GUIDED PORT INSERTION  06/15/2018   IR PERCUTANEOUS ART THROMBECTOMY/INFUSION INTRACRANIAL  INC DIAG ANGIO  07/18/2019   IR REMOVAL TUN ACCESS W/ PORT W/O FL MOD SED  12/01/2018   LOOP RECORDER INSERTION N/A 07/19/2019   Procedure: LOOP RECORDER INSERTION;  Surgeon: Constance Haw, MD;  Location: [facility];  Service: Cardiovascular;  Laterality: N/A;   RADIOLOGY WITH ANESTHESIA N/A 07/17/2019   Procedure: IR WITH ANESTHESIA;  Surgeon: Luanne Bras, MD;  Location: [facility];  Service: Radiology;  Laterality: N/A;   REPLACEMENT TOTAL KNEE BILATERAL Bilateral 2018   Right x 2. 2019 undergoing intermittent injection "RFA" procedures with Dr. Sheliah Mends [facility]   REVISION TOTAL KNEE ARTHROPLASTY Right 03/2017   ROBOTIC ASSISTED TOTAL HYSTERECTOMY WITH BILATERAL SALPINGO OOPHERECTOMY N/A 05/03/2018   Procedure: XI ROBOTIC ASSISTED TOTAL HYSTERECTOMY WITH BILATERAL SALPINGO OOPHORECTOMY;  Surgeon: Everitt Amber, MD;  Location: [facility];  Service: Gynecology;  Laterality: N/A;   SENTINEL NODE BIOPSY N/A 05/03/2018   Procedure: SENTINEL NODE BIOPSY;  Surgeon: Everitt Amber, MD;  Location: [facility];  Service: Gynecology;  Laterality: N/A;   TONSILLECTOMY AND ADENOIDECTOMY     TUBAL LIGATION      There were no vitals filed for  this visit.   Subjective Assessment - 03/13/21 1412     Subjective Patient reports very hectic day, so too busy to even think about her neck.  She did report that she did well yesterday through dinnertime, pain and tingling didn't start until bedtime, which is a huge improvement for her.  But the tingling yesterday was still "awful."    Pertinent History history of cancer, chemotherapy, bil TKR    Limitations Reading    How long can you sit comfortably? 30 - rolls blanket behind neck    How long can you stand comfortably? no limitations    How long can you walk comfortably? no difficulty    Diagnostic tests MR brain w/wo 12/29/2020 showed chronic left temporal and left insular ischemic infarcts but no evidence of acute findings. MR cervical 12/29/2020  degenerative changes with mild narrowing C4, C5 and C6 and severe left foraminal stenosis at C3-4 and severe bilateral foraminal stenosis C4-5.    Patient Stated Goals no more neck pain and no more tingling    Currently in Pain? No/denies                               [facility] Adult PT Treatment/Exercise - 03/13/21 0001       Exercises   Exercises Neck      Neck Exercises: Machines for Strengthening   Nustep L5 x 5 min      Neck Exercises: Standing   Neck Retraction 5 reps    Neck Retraction Limitations into towel at wall, reported increased tingling in neck    Wall Push Ups 10 reps    Wall Push Ups Limitations reported bil forearm pain today    Other Standing Exercises --    Other Standing Exercises wall angels x 10      Modalities   Modalities Cryotherapy      Moist Heat Therapy   Number Minutes Moist Heat 5 Minutes    Moist Heat Location Cervical      Manual Therapy   Manual Therapy Soft tissue mobilization;Other (comment)   dry needling   Manual therapy comments to cervical spine    Soft tissue mobilization STM to cervical paraspinals    Other Manual Therapy dry needling see flowsheet              Trigger Point Dry Needling - 03/13/21 0001     Consent Given? Yes    Education Handout Provided Previously provided    Muscles Treated Head and Neck Levator scapulae;Cervical multifidi   Right side, C4-C7   Levator Scapulae Response Twitch response elicited;Palpable increased muscle length    Cervical multifidi Response Twitch reponse elicited;Palpable increased muscle length                     PT Short Term Goals - 01/30/21 1502       PT SHORT TERM GOAL #1   Title Pt. will be compliant with initial HEP to improve cervical ROM to decrease neck pain    Time 2    Period Weeks    Status Achieved    Target Date 02/05/21               PT Long Term Goals - 02/19/21 1628       PT LONG TERM GOAL #1   Title Pt. will report 50%  improvement in occurance of tingling in R side of neck/face/shoulder  Baseline occurs intermittantly thorughout the day, 3x during evaluation    Time 6    Period Weeks    Status Partially Met   30% improvement, less frequent, no tingling in morning, not as much in face, just in jaw and side of neck   Target Date 03/26/21      PT LONG TERM GOAL #2   Title Pt. will report no more than 3/10 neck pain at rest at end of day.    Baseline 5/10 neck pain in morning increasing to 9/10 by end of day    Time 6    Period Weeks    Status Partially Met   progress, no pain in morning, 5/10 in evening.   Target Date 03/26/21      PT LONG TERM GOAL #3   Title Pt. will demonstrate improved cervical ROM by at least 15 deg all directions to perform ADLs    Baseline limited cervical flexion/extension/sidbending to 20 deg, R rotation only 35 deg, L rotation 50 deg, pain with all movements.    Time 6    Period Weeks    Status Partially Met   50 deg rotation bil, 30 deg extension, 30 deg flexion, 20 deg L SB, 25 deg R SB   Target Date 03/26/21      PT LONG TERM GOAL #4   Title Pt. will be able to lay on stomach to read for 15 minutes without increased neck pain.    Baseline unable to read in this position, favorite position    Time 6    Period Weeks    Status Not Met    Target Date 03/26/21                   Plan - 03/13/21 1855     Clinical Impression Statement Patient is reporting improvements in pain and tingling now, not affected until later in the day, but patient had very poor tolerance to all interventions today overall, reporting discomfort with most exercises, increased tingling in her neck with cervical retraction.   Discussed dry needling today, as has had 2 week break from needling, she did insist on having dry needling again, and while tingling stopped in her neck she complained of pain in areas needled even after STM to region.  Placed CP on neck x 5 min, after which she reported  no pain and tingling at end of session.  She would benefit from continued skilled therapy.    Personal Factors and Comorbidities Age;Comorbidity 3+    Comorbidities history of CVA, cancer, chemotherapy and deconditioning, arthritis, bil TKR, smoker    Examination-Activity Limitations Bend;Lift;Sleep;Sit;Hygiene/Grooming    Examination-Participation Restrictions Interpersonal Relationship;Driving;Community Activity;Other    Stability/Clinical Decision Making Unstable/Unpredictable    Rehab Potential Good    PT Frequency 2x / week    PT Duration 6 weeks    PT Treatment/Interventions Cryotherapy;Moist Heat;Traction;Therapeutic activities;Therapeutic exercise;Neuromuscular re-education;Manual techniques;Passive range of motion;Dry needling;Taping;Spinal Manipulations;Joint Manipulations    PT Next Visit Plan Continue postural strengthening, manual therapy/modalities PRN.   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   Middle cerebral artery embolism, 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   SAH (subarachnoid hemorrhage) (HCC)    Mixed hyperlipidemia    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, embolic 75/44/9201   Yeast infection 11/15/2018   Preventive measure 10/28/2018   Gout attack 08/08/2018   Dysuria 08/01/2018   Elevated liver enzymes 08/01/2018   Chemotherapy-induced nausea 07/14/2018   Other  constipation 07/14/2018   Physical debility 07/08/2018   Port-A-Cath in place 07/01/2018   Essential hypertension 07/01/2018   MSH6-related Lynch syndrome (HNPCC5) 07/01/2018   Arthritis 07/01/2018   Leukopenia due to antineoplastic chemotherapy ([place]) 06/26/2018   Diarrhea 06/26/2018   ARF (acute renal failure) ([place]) 06/25/2018   Diabetes mellitus without complication ([place]) 00/71/2197   Genetic testing 05/30/2018   Family history of colon cancer    Family history of genetic mutation for hereditary nonpolyposis colorectal cancer (HNPCC)    Endometrial cancer ([place]) 05/03/2018    Rennie Natter, PT DPT 03/13/2021, 7:00 PM  [facility] [address]  [place], [place], [address] Phone: [phone]   Fax:  [phone]  Name: Jordan Martinez MRN: [id number] Date of Birth: September 07, 1950

## 2021-03-14 ENCOUNTER — Telehealth: Payer: Self-pay | Admitting: *Deleted

## 2021-03-14 NOTE — Telephone Encounter (Signed)
Called and moved the patient's appt from 9/28 to 9/12 

## 2021-03-17 ENCOUNTER — Other Ambulatory Visit: Payer: Self-pay

## 2021-03-17 ENCOUNTER — Ambulatory Visit (INDEPENDENT_AMBULATORY_CARE_PROVIDER_SITE_OTHER): Payer: Medicare Other

## 2021-03-17 ENCOUNTER — Ambulatory Visit (AMBULATORY_SURGERY_CENTER): Payer: Medicare Other | Admitting: *Deleted

## 2021-03-17 VITALS — Ht 64.5 in | Wt 180.0 lb

## 2021-03-17 DIAGNOSIS — I63412 Cerebral infarction due to embolism of left middle cerebral artery: Secondary | ICD-10-CM | POA: Diagnosis not present

## 2021-03-17 DIAGNOSIS — Z800 Family history of malignant neoplasm of digestive organs: Secondary | ICD-10-CM

## 2021-03-17 DIAGNOSIS — Z86010 Personal history of colonic polyps: Secondary | ICD-10-CM

## 2021-03-17 DIAGNOSIS — Z1509 Genetic susceptibility to other malignant neoplasm: Secondary | ICD-10-CM

## 2021-03-17 MED ORDER — PLENVU 140 G PO SOLR: 1.0000 | ORAL | 0 refills | Status: DC

## 2021-03-17 NOTE — Progress Notes (Signed)
No egg or soy allergy known to patient  No issues known to pt with past sedation with any surgeries or procedures Patient denies ever being told they had issues or difficulty with intubation  No FH of Malignant Hyperthermia Pt is not on diet pills Pt is not on  home 02  Pt is not on blood thinners  Pt denies issues with constipation  No A fib or A flutter - has loop recorder  EMMI video to pt or via MyChart  COVID 19 guidelines implemented in PV today with Pt and RN   Pt is fully vaccinated  for Covid   [employer]  Coupon given to pt in PV today , Code to Pharmacy and  NO PA's for preps discussed with pt In PV today  Discussed with pt there will be an out-of-pocket cost for prep and that varies from $0 to 60+  dollars   Due to the COVID-19 pandemic we are asking patients to follow certain guidelines.  Pt aware of COVID protocols and [facility] guidelines   Pt verified name, DOB, address and insurance during PV today.  Pt mailed instruction packet of Emmi video, copy of consent form to read and not return, and instructions. [employer]  coupon mailed in packet. PV completed over the phone.  Pt encouraged to call with questions or issues.  My Chart instructions to pt as well

## 2021-03-18 ENCOUNTER — Ambulatory Visit (INDEPENDENT_AMBULATORY_CARE_PROVIDER_SITE_OTHER): Payer: Medicare Other | Admitting: Adult Health

## 2021-03-18 ENCOUNTER — Ambulatory Visit: Payer: Medicare Other | Admitting: Physical Therapy

## 2021-03-18 ENCOUNTER — Telehealth: Payer: Self-pay | Admitting: Adult Health

## 2021-03-18 ENCOUNTER — Encounter: Payer: Self-pay | Admitting: Adult Health

## 2021-03-18 ENCOUNTER — Encounter: Payer: Self-pay | Admitting: Physical Therapy

## 2021-03-18 VITALS — BP 124/66 | HR 65 | Ht 64.5 in | Wt 186.4 lb

## 2021-03-18 DIAGNOSIS — R202 Paresthesia of skin: Secondary | ICD-10-CM | POA: Diagnosis not present

## 2021-03-18 DIAGNOSIS — I639 Cerebral infarction, unspecified: Secondary | ICD-10-CM | POA: Diagnosis not present

## 2021-03-18 DIAGNOSIS — R29898 Other symptoms and signs involving the musculoskeletal system: Secondary | ICD-10-CM

## 2021-03-18 DIAGNOSIS — R252 Cramp and spasm: Secondary | ICD-10-CM

## 2021-03-18 DIAGNOSIS — M542 Cervicalgia: Secondary | ICD-10-CM | POA: Diagnosis not present

## 2021-03-18 DIAGNOSIS — G43709 Chronic migraine without aura, not intractable, without status migrainosus: Secondary | ICD-10-CM

## 2021-03-18 DIAGNOSIS — R410 Disorientation, unspecified: Secondary | ICD-10-CM | POA: Diagnosis not present

## 2021-03-18 NOTE — Patient Instructions (Addendum)
Your Plan:  Please schedule EEG to rule out seizure type activity in setting of recent confusion episodes  Recommend repeating brain imaging with MRI in setting of recent confusion episodes  Continue amitriptyline but recommend taking '10mg'$  dose in the evening (when symptoms seem to worsen) and 25 mg nightly.  If you become too fatigued after the 10 mg dose in the evening, would recommend taking '10mg'$  and '25mg'$  tabs prior to bed  Continue working with PT     Follow-up in 3 months or call earlier     Thank you for coming to see [place] at [facility]. I hope we have been able to provide you high quality care today.  You may receive a patient satisfaction survey over the next few weeks. We would appreciate your feedback and comments so that we may continue to improve ourselves and the health of our patients.

## 2021-03-18 NOTE — Progress Notes (Signed)
I agree with the above plan 

## 2021-03-18 NOTE — Telephone Encounter (Signed)
Medicare/aet[employer] supp order sent to GI. NPR they will reach out to the patient to schedule.

## 2021-03-18 NOTE — Progress Notes (Signed)
[facility] [address] [place]. [place] [address] [phone]       STROKE FOLLOW UP NOTE  [name] Date of Birth:  June 17, 1951 Medical Record Number:  [id number]   Reason for Referral: neck pain and stroke follow up    CHIEF COMPLAINT:  Chief Complaint  Patient presents with   Follow-up    Rm 2 alone here for 2 month f/u- Reports she has been doing ok. Physical therapy has been going well.       HPI:  Today, 03/18/2021, Jordan Martinez returns for 70-month follow-up unaccompanied.  She reports continued right facial numbness/tingling (more so jaw and side of neck) and posterior neck pain but overall improving currently working with PT.  Mainly symptoms only present in the evening.  She remains on amitriptyline 25 mg nightly and has not experienced any recent headaches.  Discussed use of extra 10 mg tablet as needed for breakthrough headaches as well as worsening nerve pain as needed but forget this was discussed and has not yet trialed extra dose as she has not had any additional headaches.  She does report approximately 2 weeks ago 2 separate episodes of transient confusion - 1 while trying to pump gas and forgot steps (lasted less than 1 min) and another when she mixed up checks to be mailed with her recycled papers (as both recycling and mailbox in the same vicinity where she resides) and ended up putting her checks in the recycling.  She shortly realized what she did and was able to recover her checks and mail them appropriately.  No other associated symptoms or neurological type symptoms.  She did experience similar symptoms after her prior stroke but no reoccurrence since then. No additional events since that time.  Reports compliance on aspirin and atorvastatin.  Blood pressure today 124/66. No further concerns at this time.     History provided for reference purposes only Update 01/14/2021 JM: Jordan Martinez returns for sooner follow-up visit unaccompanied to  discuss imaging and further treatment options after prior visit with Dr. Leonie Man 1 month ago for intermittent right lower face and neck paresthesias that first presented around 10/2020. MR brain w/wo 12/29/2020 showed chronic left temporal and left insular ischemic infarcts but no evidence of acute findings. MR cervical 12/29/2020 degenerative changes with mild narrowing C4, C5 and C6 and severe left foraminal stenosis at C3-4 and severe bilateral foraminal stenosis C4-5.  She continues to experience symptoms without new characteristics or features or worsening.  Typically worse in the evening associated with increase neck pain and stiffness.  Denies any radiating symptoms into her arms.  She has remained on amitriptyline 25 mg nightly for migraine prophylaxis with occasional 10 mg dose as needed.  Stable from stroke standpoint without new or reoccurring stroke/TIA symptoms.  Compliant on aspirin and atorvastatin without associated side effects.  Blood pressure today 134/84.  Loop recorder negative for atrial fibrillation thus far.  Update 12/18/2020 Dr. Leonie Man: Jordan Martinez is a pleasant 70 year old Caucasian lady seen today for office consultation visit 70 year old Caucasian lady right face and neck numbness.  History is obtained from the patient and review of referral notes and I personally reviewed electronic medical records in Care Everywhere and pertinent imaging films in PACS.  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  Patient has followed up for stroke follow-up in the clinic and was last seen by [facility] nurse practitioner on 10/01/2020.  Patient has a new complaint of tingling and numbness involving the outer aspect of the lower right face as well as neck.  This began about 2 months ago.  Initially  this feeling would go down into the right arm as well as forearm.  This lasted only few days and since then she has been having intermittent tingling sensation on the outer aspect of the right lower face as well as the neck.  This may occur a couple of times a day.  Lasts only 1 to 2 minutes.  There are no specific triggers.  This is annoying but not very bothersome.  Occasionally she has discomfort and pain in the base of her neck.  She denies any true radicular pain or severe neck pain.  There is no history of neck injury, motor vehicle accident.  She recently saw her orthopedic physician and [place] who did x-rays of the cervical spine which showed mild disc degenerative changes.  Patient also has a history of headaches following her stroke and was prescribed Elavil 25 mg at night which seems to help she still has headaches about couple of times a week but they are tolerable and not as bothersome.  She is currently on aspirin for stroke prevention and is having mild bruising but no major bleeding episodes.  He is tolerating Lipitor well with only occasional leg cramps.  Blood pressure is usually well controlled at home the today it is elevated in office at 159/85.  She plans to see a primary care physician later this month and have follow-up lipid profile and hemoglobin A1c checked.  She is extremely anxious that she may have cancer and worries a lot about this.  She has a daughter in [place] who is undergoing surgery this week and patient had to cancel her trip and she wants to finish her work-up prior to going there.  Update 10/01/2020 JM: Jordan Martinez returns for stroke and headache 48-month follow-up accompanied by her friend, Mikel Cella from a stroke standpoint without new or recurring stroke/TIA symptoms Reports compliance on aspirin and atorvastatin -denies associated side effects Blood pressure today 135/83 Loop recorder has not shown atrial fibrillation thus far  Headaches have been stable  only experiencing one migraine since prior visit. She took an additional amitriptyline 25 mg tablet with resolution.  She will experience very mild headaches approximately 2-3 times a week typically after dinner prior to schedule amitriptyline dosage but will quickly resolve after taking amitriptyline.  Percell Miller reports some difficulty with memory and anxiety She will have difficulty remembering numbers such as her address or cell phone password -this is been consistent since her stroke She feels as though her anxiety is greatly improving where she is not consistently fearful of having additional strokes and is sleeping better at night  She is being followed by pain clinic for b/l knee pain recently receiving COOLIEF therapy by Dr. Franchot Mimes but denies much benefit.  She has scheduled follow-up visit with him tomorrow for further discussion  No further concerns at this time  Update 04/01/2020 JM patient returns for stroke f/u. Stable from stroke standpoint since prior visit without residual deficits. Denies new stroke/TIA symptoms. Chronic headaches stable on amitriptyline $RemoveBeforeDE'50mg'sKvscyYdpAmXueB$  nightly but does report morning grogginess.  Remains on aspirin $RemoveBe'81mg'tSdrjiwKf$  daily and atorvastatin without side effects. Blood pressure today 138/78. Loop recorder has not shown atrial fibrillation thus  far.  She continues to be followed by pain management for bilateral knee osteoarthritis.  Does report occasional anxiety due to medical conditions but overall stable.  No further concerns at this time.  Update 11/29/2019 JM: Ms. Weakland is being seen for follow-up regarding left MCA stroke in 07/2018.  She has been doing well from a stroke standpoint without new or reoccurring stroke/TIA symptoms.  Underlying chronic headaches worsened post stroke and initiated amitriptyline with eventual increase to $RemoveBef'50mg'rwoNbfSuwn$  with great benefit. Reports mild side effects such as mild fatigue the following day and mild skin photosensitivity. She does endorse  improvement of anxiety as well with use of amitriptyline. Continues on aspirin and atorvastatin for secondary stroke prevention.  Blood pressure today 118/62.  Loop recorder has not shown atrial fibrillation thus far.  No further concerns at this time.  Initial visit 08/23/2019 JM: Ms. Zachman is a 70 year old female who is being seen today for hospital follow up.  She is a retired Marine scientist.  Recovered well from a stroke standpoint without residual speech deficits.  Prior history of frontal headaches worsened with eyestrain and was told due to prior chemo treatments.  Headache intensity worsened post stroke gradually returning to baseline but continues to experience frequently.  At times she also experienced cloudiness but this has been unchanged.  She will be seeing her ophthalmologist next week for 1 year follow-up.  Describes headaches as a pressure sensation in frontal region and behind eyes bilaterally associated with photophobia and phonophobia but denies nausea/vomiting.  She will occasionally experience small flicker of light in her periphery prior to headache onset and is unsure if headaches are worse when she experiences visual aura.  She has not previously been treated for headaches or been diagnosed with migraine headaches.  She also endorses increased anxiety due to fear of recurrent stroke.  She has completed 3 weeks DAPT and continues on aspirin alone without bleeding or bruising.  Continues on atorvastatin 40 mg daily without myalgias.  Blood pressure today satisfactory at 130/70.  She has not had follow-up with IR as recommended post revascularization during admission.  Loop recorder is not shown atrial fibrillation thus far.  No further concerns at this time.  Hospital admission 07/17/2019: Ms. Tyshea Imel is a 70 y.o. female with history of diabetes, endometrial cancer, HNPCC, hyperlipidemia, hypertension who presented on 07/17/2019 with aphasia.  Evaluated by stroke  team and  Dr. Erlinda Hong with stroke work-up revealing left MCA infarct due to left M2 occlusion s/p TPA and IR with TICI 3 revascularization of distal left M2 with resultant small SAH left sylvian fissure, infarct embolic secondary to unknown source.  2D echo unremarkable.  LE venous Doppler negative.  Loop recorder placed to rule out A. fib as potential stroke etiology.  Recommended DAPT for 3 weeks and aspirin alone.  Initiated atorvastatin 40 mg daily with LDL 120.  HTN stable.  Controlled DM with A1c 7.0.  Current tobacco use with smoking cessation counseling provided.  Other stroke risk factors include advanced age, EtOH use and obesity but no prior history of stroke.  Discharged back to [facility] with recommendation of physical therapy.  Stroke: L MCA infarct due to left M2 occlusion s/p tPA and IR with resultant small SAH left sylvian fissure, infarct embolic secondary to unknown source Code Stroke CT head hyperdense L MCA. ASPECTS 10.    CTA head & neck L M2 MCA ELVO. L ICA 50% stenosis, mild L siphon plaque. Mild atherosclerosis throughout CT  perfusion 7 mL core, 53 mL penumbra L MCA territory  Cerebral angio TICI3 revascularization distal L M2  F/u CT mild SAH L sylvian fissure. No infarct  MRI patchy small L MCA territory infarct (L insula, L frontal operculum, L temporal lobe). Small SAHD L sylvian fissure stable. MRA Unremarkable  2D Echo EF 65-70%. No source of embolus  LE venous doppler No DVT Loop recorder placed 1/13 to look for AF as source of stroke ([place]) LDL 120 HgbA1c 7.0 aspirin 81 mg daily prior to admission, will start aspirin 81 and plavix 75 at d/c. Continue DAPT x 3 weeks then aspirin alone.   Therapy recommendations: continue OP PT  Disposition:  return to [facility]    ROS:   14 system review of systems performed and negative with exception of those listed in HPI  PMH:  Past Medical History:  Diagnosis Date   Allergy    Anxiety    on meds   Arthritis    on PRN  meds   Cataract    bilateral cat. ext. with lens implant- in [place]   Diabetes mellitus without complication ([place])    diet controlled. States always has borderline HgbA1C   Endometrial cancer ([place])    endometrial CA-2019   Family history of colon cancer    daughter 37 colon CA   Family history of genetic mutation for hereditary nonpolyposis colorectal cancer ([facility])    GERD (gastroesophageal reflux disease)    on meds   High cholesterol    on meds   History of kidney stones    years    Hypertension    on meds   Neuromuscular disorder ([place])    Neuropathy    Sensation of pressure in bladder area    Stroke ([place]) 07/17/2019   Uterine cramping     PSH:  Past Surgical History:  Procedure Laterality Date   burnt the nerves  Right 04/29/2018   in right knee    burnt the nerves  Left 04/22/2018   left knee nerve    COLONOSCOPY  12/2018   [place]- 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   cystoscopy kidney stone   1985   HYSTERECTOMY ABDOMINAL WITH SALPINGO-OOPHORECTOMY  2019   IR CT HEAD LTD  07/18/2019   IR IMAGING GUIDED PORT INSERTION  06/15/2018   IR PERCUTANEOUS ART THROMBECTOMY/INFUSION INTRACRANIAL INC DIAG ANGIO  07/18/2019   IR REMOVAL TUN ACCESS W/ PORT W/O FL MOD SED  12/01/2018   LOOP RECORDER INSERTION N/A 07/19/2019   Procedure: LOOP RECORDER INSERTION;  Surgeon: Constance Haw, MD;  Location: [facility];  Service: Cardiovascular;  Laterality: N/A;   POLYPECTOMY     RADIOLOGY WITH ANESTHESIA N/A 07/17/2019   Procedure: IR WITH ANESTHESIA;  Surgeon: Luanne Bras, MD;  Location: [facility];  Service: Radiology;  Laterality: N/A;   REPLACEMENT TOTAL KNEE BILATERAL Bilateral 2018   Right x 2. 2019 undergoing intermittent injection "RFA" procedures with Dr. Sheliah Mends [facility]   REVISION TOTAL KNEE ARTHROPLASTY Right 03/2017   ROBOTIC ASSISTED TOTAL HYSTERECTOMY WITH BILATERAL SALPINGO OOPHERECTOMY N/A 05/03/2018   Procedure: XI ROBOTIC ASSISTED TOTAL  HYSTERECTOMY WITH BILATERAL SALPINGO OOPHORECTOMY;  Surgeon: Everitt Amber, MD;  Location: [facility];  Service: Gynecology;  Laterality: N/A;   SENTINEL NODE BIOPSY N/A 05/03/2018   Procedure: SENTINEL NODE BIOPSY;  Surgeon: Everitt Amber, MD;  Location: [facility];  Service: Gynecology;  Laterality: N/A;   TONSILLECTOMY AND ADENOIDECTOMY     TUBAL LIGATION  Social History:  Social History   Socioeconomic History   Marital status: Divorced    Spouse name: Not on file   Number of children: 2   Years of education: Not on file   Highest education level: Not on file  Occupational History   Occupation: retired Marine scientist  Tobacco Use   Smoking status: Every Day    Packs/day: 0.25    Years: 40.00    Pack years: 10.00    Types: Cigarettes   Smokeless tobacco: Never   Tobacco comments:    4-5 loose cigarettes per day , over 20 year hx of smoking   Vaping Use   Vaping Use: Never used  Substance and Sexual Activity   Alcohol use: Yes    Alcohol/week: 14.0 standard drinks    Types: 14 Glasses of wine per week    Comment: about 2 glasses wine daily.    Drug use: Never   Sexual activity: Not Currently    Birth control/protection: None  Other Topics Concern   Not on file  Social History Narrative   Not on file   Social Determinants of Health   Financial Resource Strain: Not on file  Food Insecurity: Not on file  Transportation Needs: Not on file  Physical Activity: Not on file  Stress: Not on file  Social Connections: Not on file  Intimate Partner Violence: Not on file    Family History:  Family History  Adopted: Yes  Problem Relation Age of Onset   Colon cancer Daughter 74       Lynch syndrome - MSH6 +   Colon polyps Daughter 86   Healthy Son    Esophageal cancer Neg Hx    Rectal cancer Neg Hx    Stomach cancer Neg Hx     Medications:   Current Outpatient Medications on File Prior to Visit  Medication Sig Dispense Refill   acetaminophen (TYLENOL) 500 MG tablet Take 1,000  mg by mouth once as needed for mild pain or moderate pain. Extra strength     amitriptyline (ELAVIL) 10 MG tablet Take 1 tablet (10 mg total) by mouth at bedtime as needed for sleep (migraine). (Patient taking differently: Take 10 mg by mouth at bedtime as needed for sleep (migraine). Back up to the 25 mg if needed) 15 tablet 5   amitriptyline (ELAVIL) 25 MG tablet Take 1 tablet (25 mg total) by mouth at bedtime. 90 tablet 3   amoxicillin (AMOXIL) 500 MG capsule Take 2,000 mg by mouth once as needed (dental work).     aspirin EC 81 MG tablet Take 81 mg by mouth daily.     atenolol (TENORMIN) 50 MG tablet Take 75 mg by mouth daily.     atorvastatin (LIPITOR) 40 MG tablet Take 1 tablet (40 mg total) by mouth at bedtime. 30 tablet 2   Biotin 5000 MCG TABS Take 5,000 mcg by mouth daily.     Cholecalciferol (VITAMIN D3) 50 MCG (2000 UT) TABS Take 1 tablet by mouth daily in the afternoon.     clotrimazole (LOTRIMIN) 1 % cream APPLY TO AFFECTED AREA TWICE A DAY     cyclobenzaprine (FLEXERIL) 5 MG tablet Take 1 tablet (5 mg total) by mouth 3 (three) times daily. (Patient taking differently: Take 5-10 mg by mouth 3 (three) times daily as needed for muscle spasms.) 90 tablet 1   hydrocortisone 2.5 % cream Apply 1 application topically 2 (two) times daily.     Lancets (ONETOUCH DELICA PLUS HYIFOY77A) [place]  Apply topically daily.     lisinopril (ZESTRIL) 10 MG tablet Take 10 mg by mouth daily.     metFORMIN (GLUCOPHAGE) 500 MG tablet TAKE 1 TABLET BY MOUTH TWICE A DAY WITH MEALS 60 tablet 1   Omega-3 Fatty Acids (FISH OIL) 1200 MG CAPS Take 1,200 mg by mouth daily.      omeprazole (PRILOSEC) 40 MG capsule Take 1 capsule (40 mg total) by mouth daily. 90 capsule 3   ONETOUCH ULTRA test strip USE TO CHECK BLOOD SUGAR ONCE DAILY E11.69     PEG-KCl-NaCl-NaSulf-Na Asc-C (PLENVU) 140 g SOLR Take 1 kit by mouth as directed. Plenvu medicare coupon-  BIN [id number] [id number] ID [id number] - NO PA'S 1 each 0    Propylene Glycol 0.6 % SOLN Place 2 drops into both eyes 4 (four) times daily.     No current facility-administered medications on file prior to visit.    Allergies:   Allergies  Allergen Reactions   Amlodipine     Blurred vision    Compazine [Prochlorperazine Edisylate]     Stroke symptoms,   Gabapentin     Blurred vision and nystagmus   Lyrica [Pregabalin]     Blurred Vision    Sertraline Other (See Comments)    Visual disturbances.    Chlorhexidine Rash    Blisters and redness to chest     Physical Exam  Vitals:   03/18/21 0910  BP: 124/66  Pulse: 65  SpO2: 91%  Weight: 186 lb 6 oz (84.5 kg)  Height: 5' 4.5" (1.638 m)    Body mass index is 31.5 kg/m. No results found.  General: well developed, well nourished, pleasant middle-age Caucasian female, seated, in no evident distress Head: head normocephalic and atraumatic.   Neck: supple with no carotid or supraclavicular bruits Cardiovascular: regular rate and rhythm, no murmurs Musculoskeletal: full neck ROM slight increase stiffness when looking towards the right but improved compared to prior visit Skin:  no rash/petichiae Vascular:  Normal pulses all extremities   Neurologic Exam Mental Status: Awake and fully alert. Fluent speech and language. Oriented to place and time. Recent  and remote memory intact. Attention span, concentration and fund of knowledge appropriate during visit. Mood and affect appropriate.  Cranial Nerves: Pupils equal, briskly reactive to light. Extraocular movements full without nystagmus. Visual fields full to confrontation. Hearing intact. Facial sensation intact. Face, tongue, palate moves normally and symmetrically.  Motor: Normal bulk and tone. Normal strength in all tested extremity muscles. Sensory.: intact to touch , pinprick , position and vibratory sensation.  Coordination: Rapid alternating movements normal in all extremities. Finger-to-nose and heel-to-shin performed accurately  bilaterally. Gait and Station: Arises from chair without difficulty. Stance is normal. Gait demonstrates normal stride length and balance without use of assistive device Reflexes: 1+ and symmetric. Toes downgoing.        ASSESSMENT: Jordan Martinez is a 70 y.o. 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 Loop recorder placed.  Vascular risk factors include history of endometrial cancer with completion of chemo 2019, HTN, HLD, DM, L ICA 50% stenosis.  Evaluated by Dr. Leonie Man 12/18/2020 for 15-month onset of intermittent right facial numbness and tingling     PLAN:  Face/neck paresthesias:  Unknown etiology over the past 5 months but some improvement working with PT -recommended continued participation with PT Continue amitriptyline 25 mg  nightly and recommend trialing 10 mg in the evening with worsening symptoms MR brain stable appearance of prior strokes and no new or worrisome findings MR cervical as above - findings likely not contributing to current symptoms  Transient confusion 2 separate episodes on the same day approximately 2 weeks ago - possible TIA vs stroke vs focal seizure Obtain MR brain w/wo contrast Obtain EEG Advised to call office with any recurrence  Cryptogenic left MCA stroke :  Continue aspirin 81 mg daily  and atorvastatin 40 mg daily for secondary stroke prevention.   Loop recorder has not shown atrial fibrillation thus far. Will continued to be monitored.  HTN: BP goal 130/90.  Well-controlled on current regimen per PCP HLD: LDL goal<70. Continue atorvastatin 14m daily. DMII: A1c goal<7.0. managed by PCP   Chronic migraines post chemo:  Currently well controlled  Continue amitriptyline 25 mg nightly and use of amitriptyline 10 mg nightly as needed for breakthrough migraine   Left ICA stenosis: Asymptomatic.  Carotid duplex  04/2020 b/l ICA 1 to 39% stenosis. Plan on repeating after follow up visit for surveillance monitoring     Follow up in 3 months or call earlier if needed   CC:  GAtwoodprovider: Dr. STona Sensing MChristian Mate MD    I spent 38 minutes of face-to-face and non-face-to-face time with patient.  This included previsit chart review, lab review, study review, order entry, electronic health record documentation, patient education and discussion regarding continued right facial and neck paresthesias, recent transient confusion events and possible etiologies, history of cryptogenic stroke as well as secondary stroke prevention measures and aggressive stroke risk factor management, review of loop recorder, chronic migraines and ongoing management and answered all other questions to patients satisfaction   JFrann Rider AGNP-BC  [facility] [address] [address] Phone [phone] [phone]: This document was prepared with digital dictation and possible smart phrase technology. Any transcriptional errors that result from this process are unintentional.

## 2021-03-18 NOTE — Progress Notes (Signed)
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 MSI high, MMR abnormal (loss of Beltrami 6). Lynch syndrome  S/p 6 cycles platinum and taxane chemotherapy completed April 22nd, 2020.  Complete clinical response, no evidence of disease on exam.   Post-treatment imaging revealed nodule at vaginal cuff (45mm). No concerning findings on examination and stable/benign appearing on follow-up imaging.   Vulvovaginal atrophy and vaginal dryness.   Plan  I am recommending 6 monthly evaluations until April, 2025.  She does not appear to have metastatic disease on most recent pet imaging, therefore additional routine screening imaging is not indicated at this time but will be ordered based on development of new symptoms or physical exam findings.  She will follow-up in 6 months with my partner, Dr Berline Lopes, as I am leaving the practice in October.    Chief Complaint  Patient presents with   Endometrial cancer [facility])    GYN Oncologic Summary  HPI: [name]  is a very nice moderately anxious 70 y.o.  P2 who was seen as a consult from Dr Ihor Dow.   She noted 3 weeks of postmenopausal bleeding.  On 03/30/2018 she reports this was quite heavy with cramps.  [place] new to the [place] living in a retirement [place] broke and she was unable to find a provider in a timely manner.  She was referred by the retirement center to an urgent care center in [facility] who then triaged her to see Dr. Ihor Dow.  Given the patient's symptoms an endometrial biopsy was performed along with a pelvic ultrasound.  Documented exam by Dr. Ihor Dow states that there was a lesion at 6:00 on the cervix which was biopsied.  In addition to the endometrial biopsy being performed.  The endometrial biopsy revealed high-grade carcinoma with a comment stating the majority appears  to be FIGO grade 3 endometrioid with scattered foci suggestive of serous phenotype.  The cervix revealed unremarkable endocervical glandular and squamous mucosa there were detached fragments of high-grade carcinoma similar to the endometrial biopsy tissue.  Ultrasound revealed a uterus 6.6 x 3 x 3.7 cm with no fibroids endometrium 1.6 cm right ovary normal left ovary not visualized.  The patient is retired Writer.  She states her last Pap smear was in March 2019 in [place].  She states she got a phone call from that office that her Pap smear was normal.  A CT scan was performed preop and noted below.  In summary no evidence of extrauterine disease.  She admits to anxiety and has some pressured speech today.  She does note some vague pain and again the primary complaint of the vaginal bleeding.  Given the diagnosis of uterine cancer she was referred for management and recommendations.  The patient underwent surgical staging with a robotic assisted total hysterectomy BSO sentinel lymph node biopsy on May 03, 2018.  Due to her symptoms of abdominal bloating, elevated Ca1 25, and her daughter's history of Lynch syndrome her tinea washings were obtained.  Intraoperative findings were otherwise unremarkable and she had no gross extrauterine disease within normal omentum diaphragms and peritoneal surfaces, 6 cm normal-appearing uterus, normal tubes and ovaries, no suspicious nodes.  The pathology revealed positive malignant cells consistent with metastatic adenocarcinoma in the peritoneal washings.  The histopathology report revealed a FIGO grade 1 endometrioid endometrial adenocarcinoma measuring 2.2 cm and invading  less than one half of the myometrium (3 of 12 mm), with no LVSI, and 5- sentinel lymph nodes, with normal cervix and adnexa.  Given the finding of positive washings, the fallopian tubes and ovaries were subsequently submitted for complete serial sectioning and no occult ovarian or  fallopian tube carcinoma was identified.  MSI testing showed high instability, and mismatch repair protein IHC was abnormal with loss of nuclear expression of Ceres 6.  She was subsequently seen by the genetics counselors on 05/18/2018 who drew blood for genetic screening including Lynch syndrome which was positive (mutation in MSH6).  Given the presence of positive washings and the preoperative biopsy of high grade cancer, she was felt to represent high risk for recurrence, and therefore adjuvant chemotherapy with carboplatin and paclitaxel was recommended.   She received chemotherapy between June 20, 2018 and October 26, 2018.  Chemotherapy was somewhat poorly tolerated by the patient who had severe bone pain, and issues with diabetes and dehydration.  She received 2 cycles of carboplatin with paclitaxel, a third cycle with Taxol at 50%, and then cycle 4-6 without Taxol carboplatin as a single agent.  Post treatment imaging on Nov 23, 2018 revealed an 8 mm hyperattenuating nodule along the midline vaginal cuff this is well-circumscribed.  It appeared related to calcification.  Otherwise no evidence of metastatic disease in the abdomen and pelvis.  A follow-up PET/CT was ordered and performed on 02/23/2021 reassess the area at the vaginal cuff that had previously shown some nodularity and avidity.  On this follow-up PET scan there was no metabolic evidence of recurrent metastatic disease.  There is a stable benign postsurgical change at the hysterectomy margin that had calcified.  No other new disease was visualized.  The PET does show evidence of coronary artery disease and atherosclerosis.    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 She was started on ASA 81 mg.   Interval Hx:   She has no symptoms concerning for recurrence.  She continues to have symptoms of vaginal atrophy and hot flashes.  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 Endometrium, biopsy - HIGH GRADE CARCINOMA, SEE COMMENT. 2. Cervix, biopsy - UNREMARKABLE ENDOCERVICAL GLANDULAR AND SQUAMOUS MUCOSA. - MULTIPLE DETACHED FRAGMENTS OF HIGH GRADE CARCINOMA, MORPHOLOGICALLY SIMILAR TO SPECIMEN 1. Microscopic Comment 1. 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   04/21/2018 Imaging   US pelvis Abnormally thickened endometrium measuring up to 16 mm. In the setting of post-menopausal bleeding, endometrial sampling is indicated to exclude carcinoma. If results are benign, sonohysterogram should be considered for focal lesion work-up.    04/27/2018 Imaging   Ct abdomen and pelvis 1. Thickening of the endometrium identified compatible with endometrial carcinoma. 2. No findings to suggest metastatic adenopathy or distant metastatic disease. 3. Hiatal hernia 4.  Aortic Atherosclerosis (ICD10-I70.0).     05/02/2018 Tumor Marker   Patient's tumor was tested for the following markers: CA-125 Results of the tumor marker test revealed 65.3   05/03/2018 Pathology Results   1. Lymph node, sentinel, biopsy, right obturator - ONE OF ONE LYMPH NODES NEGATIVE FOR CARCINOMA (0/1). 2. Lymph node, sentinel, biopsy, left external iliac - FOUR OF  FOUR LYMPH NODES NEGATIVE FOR CARCINOMA (0/4). 3. 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 TUMOR INVADES LESS THAN ONE HALF OF THE MYOMETRIUM. SEE ONCOLOGY TABLE. -SEROSA: UNREMARKABLE. NO MALIGNANCY. - CERVIX: BENIGN SQUAMOUS AND ENDOCERVICAL MUCOSA. NO DYSPLASIA  OR MALIGNANCY. - BILATERAL OVARIES: INCLUSION CYSTS. NO MALIGNANCY. - BILATERAL FALLOPIAN TUBES: UNREMARKABLE. NO MALIGNANCY. Microscopic Comment 3. UTERUS, CARCINOMA OR CARCINOSARCOMA Procedure: Total hysterectomy with bilateral salpingo-oophorectomy. Right obturator and left external iliac lymph node biopsies. Histologic type: Endometrioid adenocarcinoma with squamous differentiation. Histologic Grade: FIGO grade I. Myometrial invasion: Depth of invasion: 3 mm Myometrial thickness: 12 mm Uterine Serosa Involvement: Not identified. Cervical stromal involvement: Not identified. Extent of involvement of other organs: Uninvolved. Lymphovascular invasion: Not identified. Regional Lymph Nodes: Examined: 5 Sentinel 0 Non-sentinel 5 Total Lymph nodes with metastasis: 0 Isolated tumor cells (< 0.2 mm): 0 Micrometastasis: (> 0.2 mm and < 2.0 mm): 0 Macrometastasis: (> 2.0 mm): 0 Extracapsular extension: N/A. MMR / MSI testing: Will be ordered. Pathologic Stage Classification (pTNM, AJCC 8th edition): pT1a, pN0 FIGO Stage: IA Representative Tumor Block: 3D-G Comment: Pancytokeratin was performed on the lymph nodes and is negative   05/03/2018 Surgery   Operation: Robotic-assisted laparoscopic total hysterectomy with bilateral salpingoophorectomy, SLN biopsy    Surgeon: Donaciano Eva    Operative Findings:  : 6cm normal appearing uterus. Normal tubes and ovaries. Normal omentum and diaphragm. No suspicious nodes.      05/03/2018 Pathology Results   PERITONEAL WASHING(SPECIMEN 1 OF 1 COLLECTED 05/03/18): MALIGNANT CELLS CONSISTENT WITH METASTATIC ADENOCARCINOMA.   05/18/2018 Tumor Marker   Patient's tumor was tested for the following markers: CA-125 Results of the tumor marker test revealed 77.5   05/27/2018 Genetic Testing   MSH6 c.2731C>T pathogenic variant and GATA2 c.1232C>T VUS identified on the multicancer panel.  The Multi-Gene Panel offered by [facility] includes  sequencing and/or deletion duplication testing of the following 85 genes: AIP, ALK, APC, ATM, AXIN2,BAP1,  BARD1, BLM, BMPR1A, BRCA1, BRCA2, BRIP1, CASR, CDC73, CDH1, CDK4, CDKN1B, CDKN1C, CDKN2A (p14ARF), CDKN2A (p16INK4a), CEBPA, CHEK2, CTNNA1, DICER1, DIS3L2, EGFR (c.2369C>T, p.Thr790Met variant only), EPCAM (Deletion/duplication testing only), FH, FLCN, GATA2, GPC3, GREM1 (Promoter region deletion/duplication testing only), HOXB13 (c.251G>A, p.Gly84Glu), HRAS, KIT, MAX, MEN1, MET, MITF (c.952G>A, p.Glu318Lys variant only), MLH1, MSH2, MSH3, MSH6, MUTYH, NBN, NF1, NF2, NTHL1, PALB2, PDGFRA, PHOX2B, PMS2, POLD1, POLE, POT1, PRKAR1A, PTCH1, PTEN, RAD50, RAD51C, RAD51D, RB1, RECQL4, RET, RNF43, RUNX1, SDHAF2, SDHA (sequence changes only), SDHB, SDHC, SDHD, SMAD4, SMARCA4, SMARCB1, SMARCE1, STK11, SUFU, TERC, TERT, TMEM127, TP53, TSC1, TSC2, VHL, WRN and WT1.  The report date is 05/27/2018.   06/09/2018 Cancer Staging   Staging form: Corpus Uteri - Carcinoma and Carcinosarcoma, AJCC 8th Edition - Pathologic: Stage I (pT1, pN0, cM0) - Signed by Heath Lark, MD on 06/09/2018   06/15/2018 Procedure   Placement of single lumen port a cath via right internal jugular vein. The catheter tip lies at the cavo-atrial junction. A power injectable port a cath was placed and is ready for immediate use.     06/20/2018 - 10/26/2018 Chemotherapy   The patient had carboplatin and Taxol x 2 cycles, Cycle 3 Taxol at 50%, then cycle 4-6 without Taxol, carboplatin only   06/25/2018 - 06/27/2018 Hospital Admission   She was admitted to the hospital with deydration   11/23/2018 Imaging   1. 8 mm hyperattenuating nodule along the midline vaginal cuff. Coronal imaging suggests  that the hyperattenuation is related to calcification, but enhancement in this nodule cannot be excluded. Patient is status post hysterectomy in the interval since prior CT. While this finding may be related to prior surgery, close follow-up  recommended. 2. Otherwise no evidence for metastatic disease in the abdomen or pelvis. 3. Large hiatal hernia with more than 50% of the stomach herniated into the lower chest, stable. 4.  Aortic Atherosclerois (ICD10-170.0)   12/01/2018 Procedure   Successful removal of implanted Port-A-Cath     Measurement of disease: Pending further workup CA125 TBD  Radiology: MR BRAIN W WO CONTRAST  Result Date: 01/02/2021 [facility] NEUROIMAGING REPORT STUDY DATE: 12/29/20 PATIENT NAME: Jordan Martinez DOB: 1951/03/30 MRN: [id number] ORDERING CLINICIAN: Garvin Fila, MD CLINICAL HISTORY: 70 year old female with right face numbness. EXAM: MR BRAIN W WO CONTRAST TECHNIQUE: MRI of the brain with and without contrast was obtained utilizing 5 mm axial slices with T1, T2, T2 flair, SWI and diffusion weighted views.  T1 sagittal, T2 coronal and postcontrast views in the axial and coronal plane were obtained. CONTRAST: 30ml multihance COMPARISON: 07/18/19 IMAGING SITE: [facility] [address] (1.5 Tesla MRI)  FINDINGS: No abnormal lesions are seen on diffusion-weighted views to suggest acute ischemia. 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 The cortical sulci, fissures and cisterns are normal in size and appearance. Lateral, third and fourth ventricle are normal in size and appearance. No extra-axial fluid collections are seen. No evidence of mass effect or midline shift.  No abnormal lesions are seen on post contrast views.  On sagittal views the posterior fossa, pituitary gland and corpus callosum are unremarkable. No evidence of intracranial hemorrhage on SWI views. The orbits and their contents, paranasal sinuses and calvarium are unremarkable.  Intracranial flow voids are present.   MRI brain (with and without) demonstrating - Chronic left temporal and left insular ischemic infarcts. - No acute findings. INTERPRETING PHYSICIAN: Penni Bombard, MD Certified  in Neurology, Neurophysiology and Neuroimaging [facility] [address] [place], [place] [address] [phone]   MR CERVICAL SPINE W WO CONTRAST  Result Date: 01/02/2021 [facility] NEUROIMAG[facility] REPORT STUDY DATE: 12/29/20 PATIENT NAME: Jordan Martinez DOB: 1951-02-12 MRN: [id number] ORDERING CLINICIAN: Garvin Fila, MD CLINICAL HISTORY: 70 year old female with head and neck pain. EXAM: MR CERVICAL SPINE W WO CONTRAST TECHNIQUE: MRI of the cervical spine was obtained utilizing 3 mm sagittal slices from the posterior fossa down to the T3-4 level with T1, T2 and inversion recovery views. In addition 4 mm axial slices from E9-9 down to T1-2 level were included with T2 and gradient echo views. CONTRAST: 64ml multihance COMPARISON: none IMAGING SITE: [facility] [address] (1.5 Tesla MRI)  FINDINGS: On sagittal views the vertebral bodies have normal height and alignment.  Gauger spondylosis and disc bulging from C4-5 down to C6-7.  Reversal of normal cervical curvature.  The spinal cord is normal in size and appearance. The posterior fossa, pituitary gland and paraspinal soft tissues are unremarkable.  On axial views: C2-3 disc bulging and facet hypertrophy with moderate right foraminal stenosis C3-4 disc bulging and facet hypertrophy with mild right and severe left foraminal stenosis C4-5 broad-based disc bulge and osteophytic spurring, facet hypertrophy with mild spinal stenosis and severe bilateral foraminal stenosis C5-6 disc bulging and facet hypertrophy with mild spinal stenosis and severe bilateral foraminal stenosis C6-7 disc bulging with no spinal stenosis or foraminal narrowing C7-T1 no  spinal stenosis or foraminal narrowing Limited views of the soft tissues of the head and neck are unremarkable. No abnormal enhancing lesions.   MRI cervical spine with and without contrast demonstrating: - At C4-5 broad-based disc bulge and osteophytic  spurring, facet hypertrophy with mild spinal stenosis and severe bilateral foraminal stenosis. - At C5-6 disc bulging and facet hypertrophy with mild spinal stenosis and severe bilateral foraminal stenosis. - At C3-4 disc bulging and facet hypertrophy with mild right and severe left foraminal stenosis. INTERPRETING PHYSICIAN: Penni Bombard, MD Certified in Neurology, Neurophysiology and Neuroimaging [facility] [address], [place], [place] [address] [phone]   CUP PACEART REMOTE DEVICE CHECK  Result Date: 02/06/2021 ILR summary report received. Battery status OK. Normal device function. No new symptom, tachy, brady, or pause episodes. No new AF episodes. Monthly summary reports and ROV/PRN Kathy Breach, RN, CCDS, [employer]  CUP PACEART REMOTE DEVICE CHECK  Result Date: 01/08/2021 ILR summary report received. Battery status OK. Normal device function. No new symptom, tachy, brady, or pause episodes. No new AF episodes. Monthly summary reports and ROV/PRN Kathy Breach, RN, CCDS, [employer]     Outpatient Encounter Medications as of 03/19/2021  Medication Sig   acetaminophen (TYLENOL) 500 MG tablet Take 1,000 mg by mouth once as needed for mild pain or moderate pain. Extra strength   amitriptyline (ELAVIL) 10 MG tablet Take 1 tablet (10 mg total) by mouth at bedtime as needed for sleep (migraine). (Patient taking differently: Take 10 mg by mouth at bedtime as needed for sleep (migraine). Back up to the 25 mg if needed)   amitriptyline (ELAVIL) 25 MG tablet Take 1 tablet (25 mg total) by mouth at bedtime.   amoxicillin (AMOXIL) 500 MG capsule Take 2,000 mg by mouth once as needed (dental work).   aspirin EC 81 MG tablet Take 81 mg by mouth daily.   atenolol (TENORMIN) 50 MG tablet Take 75 mg by mouth daily.   atorvastatin (LIPITOR) 40 MG tablet Take 1 tablet (40 mg total) by mouth at bedtime.   Biotin 5000 MCG TABS Take 5,000 mcg by mouth  daily.   Cholecalciferol (VITAMIN D3) 50 MCG (2000 UT) TABS Take 1 tablet by mouth daily in the afternoon.   clotrimazole (LOTRIMIN) 1 % cream APPLY TO AFFECTED AREA TWICE A DAY   cyclobenzaprine (FLEXERIL) 5 MG tablet Take 1 tablet (5 mg total) by mouth 3 (three) times daily. (Patient taking differently: Take 5-10 mg by mouth 3 (three) times daily as needed for muscle spasms.)   hydrocortisone 2.5 % cream Apply 1 application topically 2 (two) times daily.   Lancets (ONETOUCH DELICA PLUS [id number]) MISC Apply topically daily.   lisinopril (ZESTRIL) 10 MG tablet Take 10 mg by mouth daily.   metFORMIN (GLUCOPHAGE) 500 MG tablet TAKE 1 TABLET BY MOUTH TWICE A DAY WITH MEALS   Omega-3 Fatty Acids (FISH OIL) 1200 MG CAPS Take 1,200 mg by mouth daily.    omeprazole (PRILOSEC) 40 MG capsule Take 1 capsule (40 mg total) by mouth daily.   ONETOUCH ULTRA test strip USE TO CHECK BLOOD SUGAR ONCE DAILY E11.69   Propylene Glycol 0.6 % SOLN Place 2 drops into both eyes 4 (four) times daily.   PEG-KCl-NaCl-NaSulf-Na Asc-C (PLENVU) 140 g SOLR Take 1 kit by mouth as directed. Plenvu medicare coupon-  BIN [id number] PCN-CNRX [id number] ID [id number] - NO PA'S (Patient not taking: Reported on 03/19/2021)   No facility-administered encounter  medications on file as of 03/19/2021.   Allergies  Allergen Reactions   Amlodipine     Blurred vision    Compazine [Prochlorperazine Edisylate]     Stroke symptoms,   Gabapentin     Blurred vision and nystagmus   Lyrica [Pregabalin]     Blurred Vision    Sertraline Other (See Comments)    Visual disturbances.    Chlorhexidine Rash    Blisters and redness to chest    Past Medical History:  Diagnosis Date   Allergy    Anxiety    on meds   Arthritis    on PRN meds   Cataract    bilateral cat. ext. with lens implant- in [place]   Diabetes mellitus without complication ([place])    diet controlled. States always has borderline HgbA1C   Endometrial cancer ([place])     endometrial CA-2019   Family history of colon cancer    daughter 58 colon CA   Family history of genetic mutation for hereditary nonpolyposis colorectal cancer ([facility])    GERD (gastroesophageal reflux disease)    on meds   High cholesterol    on meds   History of kidney stones    years    Hypertension    on meds   Neuromuscular disorder ([place])    Neuropathy    Sensation of pressure in bladder area    Stroke ([place]) 07/17/2019   Uterine cramping    Past Surgical History:  Procedure Laterality Date   burnt the nerves  Right 04/29/2018   in right knee    burnt the nerves  Left 04/22/2018   left knee nerve    COLONOSCOPY  12/2018   [place]- 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   cystoscopy kidney stone   1985   HYSTERECTOMY ABDOMINAL WITH SALPINGO-OOPHORECTOMY  2019   IR CT HEAD LTD  07/18/2019   IR IMAGING GUIDED PORT INSERTION  06/15/2018   IR PERCUTANEOUS ART THROMBECTOMY/INFUSION INTRACRANIAL INC DIAG ANGIO  07/18/2019   IR REMOVAL TUN ACCESS W/ PORT W/O FL MOD SED  12/01/2018   LOOP RECORDER INSERTION N/A 07/19/2019   Procedure: LOOP RECORDER INSERTION;  Surgeon: Constance Haw, MD;  Location: [facility];  Service: Cardiovascular;  Laterality: N/A;   POLYPECTOMY     RADIOLOGY WITH ANESTHESIA N/A 07/17/2019   Procedure: IR WITH ANESTHESIA;  Surgeon: Luanne Bras, MD;  Location: [facility];  Service: Radiology;  Laterality: N/A;   REPLACEMENT TOTAL KNEE BILATERAL Bilateral 2018   Right x 2. 2019 undergoing intermittent injection "RFA" procedures with Dr. Sheliah Mends [facility]   REVISION TOTAL KNEE ARTHROPLASTY Right 03/2017   ROBOTIC ASSISTED TOTAL HYSTERECTOMY WITH BILATERAL SALPINGO OOPHERECTOMY N/A 05/03/2018   Procedure: XI ROBOTIC ASSISTED TOTAL HYSTERECTOMY WITH BILATERAL SALPINGO OOPHORECTOMY;  Surgeon: Everitt Amber, MD;  Location: [facility];  Service: Gynecology;  Laterality: N/A;   SENTINEL NODE BIOPSY N/A 05/03/2018   Procedure: SENTINEL NODE BIOPSY;   Surgeon: Everitt Amber, MD;  Location: [facility];  Service: Gynecology;  Laterality: N/A;   TONSILLECTOMY AND ADENOIDECTOMY     TUBAL LIGATION          Past Gynecological History:   GYNECOLOGIC HISTORY:  No LMP recorded. Patient has had a hysterectomy. 42 Menarche: 70 years old P 2 Contraceptive desogen OCP and diaphragm history HRT none  Last Pap March 2019 "states normal" Family Hx:  Family History  Adopted: Yes  Problem Relation Age of Onset   Colon cancer Daughter 67  Lynch syndrome - MSH6 +   Colon polyps Daughter 71   Healthy Son    Esophageal cancer Neg Hx    Rectal cancer Neg Hx    Stomach cancer Neg Hx    Social Hx:  Tobacco use: current every day Alcohol use: 2 glasses wine daily, negative CAGE Illicit Drug use: none Illicit IV Drug use: none    Review of Systems: Review of Systems  Constitutional: Negative.   HENT:  Negative.    Eyes: Negative.   Respiratory: Negative.    Cardiovascular: Negative.   Gastrointestinal: Negative.   Endocrine: Negative.   Genitourinary: Negative.    Musculoskeletal:  Positive for arthralgias.  Neurological: Negative.   Hematological: Negative.   Psychiatric/Behavioral:  The patient is nervous/anxious.   All other systems reviewed and are negative.+weight gain  Vitals:  Vitals:   03/19/21 1122  BP: 133/87  Pulse: 79  Resp: 16  Temp: 98.4 F (36.9 C)  SpO2: 99%   Vitals:   03/19/21 1122  Weight: 185 lb 9.6 oz (84.2 kg)  Height: _0  (1.626 m)   Body mass index is 31.86 kg/m.  Physical Exam: General :  Overweight.Well developed, 70 y.o., female in no apparent distress HEENT:  Normocephalic/atraumatic, symmetric, EOMI, eyelids normal Neck:   Supple, no masses.  Lymphatics:  No cervical/ submandibular/ supraclavicular/ infraclavicular/ inguinal adenopathy Respiratory:  Respirations unlabored, no use of accessory muscles CV:   Deferred Breast:  Deferred Musculoskeletal: No CVA tenderness, normal muscle  strength. Abdomen:  Soft, non-tender and nondistended. No evidence of hernia. No masses. Extremities:  No lymphedema, no erythema, non-tender. Skin:   Normal inspection Neuro/Psych:  No focal motor deficit, no abnormal mental status. Normal gait. Normal affect. Alert and oriented to person, place, and time. Soft incisions.  Genito Urinary: Vaginal cuff well healed, no lesions or masses. Smooth, no lesions. Atrophic.  Rectovaginal:  Soft rectovaginal tissues, no palpable nodules.   Thereasa Solo, MD  Cc: Dr. Lavonia Drafts (Referring Ob/Gyn) Javier Glazier, MD (PCP)

## 2021-03-18 NOTE — Therapy (Addendum)
PHYSICAL THERAPY DISCHARGE SUMMARY (04/21/21)  Visits from Start of Care: 17  Current functional level related to goals / functional outcomes: See progress note below.  75% overall improvement.     Remaining deficits: Tingling in neck with capital extension, unable to tolerate prone on elbows.     Education / Equipment: HEP  Plan: Patient agrees to discharge.  Patient goals were not met but good progress overall.  See progress noted below.  She had been placed on 30 day hold as she was not continuing to make progress.  She did not call back to schedule more visits within that time frame.      Rennie Natter, PT    [facility] [address]  [place], [place], [address] Phone: [phone]   Fax:  [phone]  Physical Therapy Treatment Progress Note Reporting Period 02/19/21 to 03/18/21  See note below for Objective Data and Assessment of Progress/Goals.     Patient Details  Name: Jordan Martinez MRN: [id number] Date of Birth: [birth year] Referring Provider (PT): Frann Rider   Encounter Date: 03/18/2021   PT End of Session - 03/18/21 1410     Visit Number 17    Number of Visits 20    Date for PT Re-Evaluation 03/26/21    Authorization Type Medicare A&B, [employer]    Progress Note Due on Visit 20    PT Start Time 1402    PT Stop Time 1455    PT Time Calculation (min) 53 min    Activity Tolerance Patient tolerated treatment well    Behavior During Therapy WFL for tasks assessed/performed             Past Medical History:  Diagnosis Date   Allergy    Anxiety    on meds   Arthritis    on PRN meds   Cataract    bilateral cat. ext. with lens implant- in [place]   Diabetes mellitus without complication ([place])    diet controlled. States always has borderline HgbA1C   Endometrial cancer ([place])    endometrial CA-2019   Family history of colon cancer    daughter 40 colon CA   Family history of genetic  mutation for hereditary nonpolyposis colorectal cancer (HNPCC)    GERD (gastroesophageal reflux disease)    on meds   High cholesterol    on meds   History of kidney stones    years    Hypertension    on meds   Neuromuscular disorder ([place])    Neuropathy    Sensation of pressure in bladder area    Stroke ([place]) 07/17/2019   Uterine cramping     Past Surgical History:  Procedure Laterality Date   burnt the nerves  Right 04/29/2018   in right knee    burnt the nerves  Left 04/22/2018   left knee nerve    COLONOSCOPY  12/2018   [place]- 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   cystoscopy kidney stone   1985   HYSTERECTOMY ABDOMINAL WITH SALPINGO-OOPHORECTOMY  2019   IR CT HEAD LTD  07/18/2019   IR IMAGING GUIDED PORT INSERTION  06/15/2018   IR PERCUTANEOUS ART THROMBECTOMY/INFUSION INTRACRANIAL INC DIAG ANGIO  07/18/2019   IR REMOVAL TUN ACCESS W/ PORT W/O FL MOD SED  12/01/2018   LOOP RECORDER INSERTION N/A 07/19/2019   Procedure: LOOP RECORDER INSERTION;  Surgeon: Constance Haw, MD;  Location: [facility];  Service: Cardiovascular;  Laterality:  N/A;   POLYPECTOMY     RADIOLOGY WITH ANESTHESIA N/A 07/17/2019   Procedure: IR WITH ANESTHESIA;  Surgeon: Luanne Bras, MD;  Location: [facility];  Service: Radiology;  Laterality: N/A;   REPLACEMENT TOTAL KNEE BILATERAL Bilateral 2018   Right x 2. 2019 undergoing intermittent injection "RFA" procedures with Dr. Sheliah Mends [facility]   REVISION TOTAL KNEE ARTHROPLASTY Right 03/2017   ROBOTIC ASSISTED TOTAL HYSTERECTOMY WITH BILATERAL SALPINGO OOPHERECTOMY N/A 05/03/2018   Procedure: XI ROBOTIC ASSISTED TOTAL HYSTERECTOMY WITH BILATERAL SALPINGO OOPHORECTOMY;  Surgeon: Everitt Amber, MD;  Location: [facility];  Service: Gynecology;  Laterality: N/A;   SENTINEL NODE BIOPSY N/A 05/03/2018   Procedure: SENTINEL NODE BIOPSY;  Surgeon: Everitt Amber, MD;  Location: [facility];  Service: Gynecology;  Laterality: N/A;   TONSILLECTOMY AND  ADENOIDECTOMY     TUBAL LIGATION      There were no vitals filed for this visit.   Subjective Assessment - 03/18/21 1405     Subjective Patient reports she had neurology appt. today about continued tingling, now needs to get MRI and EEG due to episode of transient memory loss.  She did get clarification on use of amytriptiline at night to see if that improves the tingling/pain.   She was also told that the tingling could be a migraine symptom.  Reports she had tingling in car ride over.    Pertinent History history of cancer, chemotherapy, bil TKR    Limitations Reading    How long can you sit comfortably? 30 - rolls blanket behind neck    How long can you stand comfortably? no limitations    How long can you walk comfortably? no difficulty    Diagnostic tests MR brain w/wo 12/29/2020 showed chronic left temporal and left insular ischemic infarcts but no evidence of acute findings. MR cervical 12/29/2020 degenerative changes with mild narrowing C4, C5 and C6 and severe left foraminal stenosis at C3-4 and severe bilateral foraminal stenosis C4-5.    Patient Stated Goals no more neck pain and no more tingling    Currently in Pain? Yes    Pain Score 1     Pain Location Neck    Pain Orientation Right    Pain Descriptors / Indicators Aching;Tingling    Pain Type Chronic pain                [facility] PT Assessment - 03/18/21 0001       Assessment   Medical Diagnosis R20.2 Tingling sensation in face, M54.2 Cervicalgia    Referring Provider (PT) Frann Rider    Onset Date/Surgical Date 10/04/20    Hand Dominance Right    Next MD Visit 03/18/2021      Observation/Other Assessments   Focus on Therapeutic Outcomes (FOTO)  66 (52 on initial evaluation)      AROM   Cervical Flexion 45    Cervical Extension 45    Cervical - Right Rotation 65    Cervical - Left Rotation 65                           [facility] Adult PT Treatment/Exercise - 03/18/21 0001       Exercises    Exercises Neck      Neck Exercises: Machines for Strengthening   UBE (Upper Arm Bike) L1 x 6 min (3 min foward/3 min retro)      Neck Exercises: Prone   Neck Retraction 10 reps  Neck Retraction Limitations rest breaks between reps if symptoms increased, followed by another set as symptoms abated.  Remained in prone x 5 min      Modalities   Modalities Moist Heat      Moist Heat Therapy   Number Minutes Moist Heat 10 Minutes    Moist Heat Location Cervical      Manual Therapy   Manual Therapy Soft tissue mobilization;Joint mobilization;Manual Traction    Manual therapy comments to cervical spine    Joint Mobilization PA mobs to cervical spine in supine grade 2-3, PA mobs to thoracic spine in prone    Soft tissue mobilization STM to cervical paraspinals    Manual Traction to cervical region, reported decreased pain                     PT Education - 03/18/21 1859     Education Details education on progress, postures to avoid (symptoms increase with capital extension).    Person(s) Educated Patient    Methods Explanation    Comprehension Verbalized understanding              PT Short Term Goals - 01/30/21 1502       PT SHORT TERM GOAL #1   Title Pt. will be compliant with initial HEP to improve cervical ROM to decrease neck pain    Time 2    Period Weeks    Status Achieved    Target Date 02/05/21               PT Long Term Goals - 03/18/21 1440       PT LONG TERM GOAL #1   Title Pt. will report 50% improvement in occurance of tingling in R side of neck/face/shoulder    Baseline occurs intermittantly thorughout the day, 3x during evaluation    Time 6    Period Weeks    Status Achieved   03/18/21- 75% improvement     PT LONG TERM GOAL #2   Title Pt. will report no more than 3/10 neck pain at rest at end of day.    Baseline 5/10 neck pain in morning increasing to 9/10 by end of day    Time 6    Period Weeks    Status Partially Met   9/13/ no  change- no pain in morning, 5/10 in evening.     PT LONG TERM GOAL #3   Title Pt. will demonstrate improved cervical ROM by at least 15 deg all directions to perform ADLs    Baseline limited cervical flexion/extension/sidbending to 20 deg, R rotation only 35 deg, L rotation 50 deg, pain with all movements.    Time 6    Period Weeks    Status Achieved   03/18/21  65 deg rotation bil, 45 deg flexion and 45 deg extension.  No pain.     PT LONG TERM GOAL #4   Title Pt. will be able to lay on stomach to read for 15 minutes without increased neck pain.    Baseline unable to read in this position, favorite position    Time 6    Period Weeks    Status Not Met   unable to lay in prone with neck extended more than 1 minuted without increased tingling.                  Plan - 03/18/21 1900     Clinical Impression Statement Patient reports no improvement following dry needling  last session.  She did demonstrate increased tingling whenever in position of capital extension - educated on anatomy and need to keep chin tucked and avoid this posture.  Trialed laying on belly - again any time she extended her neck she would have increased tingling, but when put head back down and tucked chin her tingling would stop.  Overall she was able to remain in prone for 5 minutes, and discussed trying to increase tolerance at home, but recommended not trying to read in this position anymore.  She reports 75% improvement overall in tingling symptoms and has met LTG #1, #3, and good progress towards #2.   We had a long discussion today about lack of progress over the last few weeks, as she was not able to tolerate progression of exercises without exacerbing the tingling, and still too tender in suboccipitals to tolerate mechanical traction, and poor tolerance to manual therapy.  She agreed to a 30 day hold while she tried increasing her amytriptiline dose as recommended by her neurologist.  She is also scheduled for  multiple other medical interventions (colonoscopy, COVID booster, EEG, MRI, follow up cancer) and PT visits are an additional stressor.  She is independent and very compliant with her HEP, and to continue.  If her symptoms maintain or improve, will discharge.    Personal Factors and Comorbidities Age;Comorbidity 3+    Comorbidities history of CVA, cancer, chemotherapy and deconditioning, arthritis, bil TKR, smoker    Examination-Activity Limitations Bend;Lift;Sleep;Sit;Hygiene/Grooming    Examination-Participation Restrictions Interpersonal Relationship;Driving;Community Activity;Other    Stability/Clinical Decision Making Unstable/Unpredictable    Rehab Potential Good    PT Frequency 2x / week    PT Duration 6 weeks    PT Treatment/Interventions Cryotherapy;Moist Heat;Traction;Therapeutic activities;Therapeutic exercise;Neuromuscular re-education;Manual techniques;Passive range of motion;Dry needling;Taping;Spinal Manipulations;Joint Manipulations    PT Next Visit Plan 30 day hold    PT Home Exercise Plan [email or web address]    Consulted and Agree with Plan of Care Patient             Patient will benefit from skilled therapeutic intervention in order to improve the following deficits and impairments:  Hypomobility, Increased muscle spasms, Decreased range of motion, Decreased activity tolerance, Decreased strength, Impaired flexibility, Pain, Postural dysfunction  Visit Diagnosis: Cervicalgia  Cramp and spasm  Other symptoms and signs involving the musculoskeletal system  Tingling of face     Problem List Patient Active Problem List   Diagnosis Date Noted   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   Middle cerebral artery embolism, 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   SAH (subarachnoid hemorrhage) ([place])    Mixed hyperlipidemia    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, embolic 99/35/7017   Yeast infection 11/15/2018   Preventive measure 10/28/2018   Gout attack 08/08/2018   Dysuria  08/01/2018   Elevated liver enzymes 08/01/2018   Chemotherapy-induced nausea 07/14/2018   Other constipation 07/14/2018   Physical debility 07/08/2018   Port-A-Cath in place 07/01/2018   Essential hypertension 07/01/2018   MSH6-related Lynch syndrome (HNPCC5) 07/01/2018   Arthritis 07/01/2018   Leukopenia due to antineoplastic chemotherapy ([place]) 06/26/2018   Diarrhea 06/26/2018   ARF (acute renal failure) ([place]) 06/25/2018   Diabetes mellitus without complication ([place]) 79/39/0300   Genetic testing 05/30/2018   Family history of colon cancer    Family history of genetic mutation for hereditary nonpolyposis colorectal cancer (HNPCC)    Endometrial cancer ([place]) 05/03/2018    Rennie Natter, PT DPT 03/18/2021, 7:18 PM  [facility] [address]  [place], [place], [address] Phone: [phone]   Fax:  [phone]  Name: Jordan Martinez MRN: [id number] Date of Birth: 1951/01/06

## 2021-03-19 ENCOUNTER — Inpatient Hospital Stay: Payer: Medicare Other | Attending: Gynecologic Oncology | Admitting: Gynecologic Oncology

## 2021-03-19 ENCOUNTER — Ambulatory Visit: Payer: Medicare Other | Admitting: Gynecologic Oncology

## 2021-03-19 ENCOUNTER — Other Ambulatory Visit: Payer: Self-pay

## 2021-03-19 ENCOUNTER — Encounter: Payer: Self-pay | Admitting: Gynecologic Oncology

## 2021-03-19 VITALS — BP 133/87 | HR 79 | Temp 98.4°F | Resp 16 | Ht 64.0 in | Wt 185.6 lb

## 2021-03-19 DIAGNOSIS — Z9221 Personal history of antineoplastic chemotherapy: Secondary | ICD-10-CM | POA: Insufficient documentation

## 2021-03-19 DIAGNOSIS — Z90722 Acquired absence of ovaries, bilateral: Secondary | ICD-10-CM | POA: Insufficient documentation

## 2021-03-19 DIAGNOSIS — Z1509 Genetic susceptibility to other malignant neoplasm: Secondary | ICD-10-CM | POA: Insufficient documentation

## 2021-03-19 DIAGNOSIS — N952 Postmenopausal atrophic vaginitis: Secondary | ICD-10-CM | POA: Insufficient documentation

## 2021-03-19 DIAGNOSIS — Z90710 Acquired absence of both cervix and uterus: Secondary | ICD-10-CM | POA: Diagnosis not present

## 2021-03-19 DIAGNOSIS — C541 Malignant neoplasm of endometrium: Secondary | ICD-10-CM

## 2021-03-19 DIAGNOSIS — Z8542 Personal history of malignant neoplasm of other parts of uterus: Secondary | ICD-10-CM | POA: Insufficient documentation

## 2021-03-19 LAB — CUP PACEART REMOTE DEVICE CHECK
Date Time Interrogation Session: 20220903230640
Implantable Pulse Generator Implant Date: 20210113

## 2021-03-19 NOTE — Patient Instructions (Signed)
Please notify Dr Denman George at phone number [phone] if you notice vaginal bleeding, new pelvic or abdominal pains, bloating, feeling full easy, or a change in bladder or bowel function.   Dr Denman George is departing the [place] at [facility] in October, 2022. Her partners and colleagues including Dr Berline Lopes, Dr Delsa Sale and [name], Nurse Practitioner will be available to continue your care.   You are next scheduled to return to the Gynecologic Oncology office at the [facility] in March, 2023. Please call [phone] in January to request an appointment for March with Dr Serita Grit partner, Dr Berline Lopes.

## 2021-03-20 ENCOUNTER — Encounter: Payer: Medicare Other | Admitting: Physical Therapy

## 2021-03-21 ENCOUNTER — Other Ambulatory Visit: Payer: Self-pay

## 2021-03-21 ENCOUNTER — Ambulatory Visit
Admission: RE | Admit: 2021-03-21 | Discharge: 2021-03-21 | Disposition: A | Discharge status: Home / Self Care | Payer: Medicare Other | Source: Ambulatory Visit | Attending: Adult Health | Admitting: Adult Health

## 2021-03-21 DIAGNOSIS — R410 Disorientation, unspecified: Secondary | ICD-10-CM | POA: Diagnosis not present

## 2021-03-21 DIAGNOSIS — R202 Paresthesia of skin: Secondary | ICD-10-CM | POA: Diagnosis not present

## 2021-03-21 MED ORDER — GADOBENATE DIMEGLUMINE 529 MG/ML IV SOLN
17.0000 mL | Freq: Once | INTRAVENOUS | Status: AC | PRN
  Administered 2021-03-21: 17 mL via INTRAVENOUS

## 2021-03-23 ENCOUNTER — Encounter: Payer: Self-pay | Admitting: Adult Health

## 2021-03-24 ENCOUNTER — Other Ambulatory Visit: Payer: Self-pay

## 2021-03-24 ENCOUNTER — Telehealth: Payer: Self-pay | Admitting: Adult Health

## 2021-03-24 ENCOUNTER — Encounter: Payer: Self-pay | Admitting: Adult Health

## 2021-03-24 DIAGNOSIS — Z8673 Personal history of transient ischemic attack (TIA), and cerebral infarction without residual deficits: Secondary | ICD-10-CM

## 2021-03-24 DIAGNOSIS — I639 Cerebral infarction, unspecified: Secondary | ICD-10-CM

## 2021-03-24 DIAGNOSIS — R410 Disorientation, unspecified: Secondary | ICD-10-CM

## 2021-03-24 NOTE — Progress Notes (Signed)
Carelink Summary Report / Loop Recorder 

## 2021-03-24 NOTE — Telephone Encounter (Signed)
EEG order sent to [facility].

## 2021-03-25 ENCOUNTER — Encounter: Payer: Medicare Other | Admitting: Physical Therapy

## 2021-03-27 ENCOUNTER — Encounter: Payer: Medicare Other | Admitting: Physical Therapy

## 2021-03-31 ENCOUNTER — Other Ambulatory Visit: Payer: Self-pay

## 2021-03-31 ENCOUNTER — Ambulatory Visit (AMBULATORY_SURGERY_CENTER): Payer: Medicare Other | Admitting: Gastroenterology

## 2021-03-31 ENCOUNTER — Encounter: Payer: Self-pay | Admitting: Gastroenterology

## 2021-03-31 VITALS — BP 121/67 | HR 74 | Temp 97.2°F | Resp 25 | Ht 64.0 in | Wt 180.0 lb

## 2021-03-31 DIAGNOSIS — D122 Benign neoplasm of ascending colon: Secondary | ICD-10-CM | POA: Diagnosis not present

## 2021-03-31 DIAGNOSIS — Z800 Family history of malignant neoplasm of digestive organs: Secondary | ICD-10-CM

## 2021-03-31 DIAGNOSIS — Z86010 Personal history of colonic polyps: Secondary | ICD-10-CM

## 2021-03-31 DIAGNOSIS — Z1211 Encounter for screening for malignant neoplasm of colon: Secondary | ICD-10-CM

## 2021-03-31 DIAGNOSIS — D125 Benign neoplasm of sigmoid colon: Secondary | ICD-10-CM

## 2021-03-31 MED ORDER — SODIUM CHLORIDE 0.9 % IV SOLN: 500.0000 mL | Freq: Once | INTRAVENOUS | Status: DC

## 2021-03-31 NOTE — Patient Instructions (Signed)
Handouts given on polyps and diverticulosis. Await pathology results. Repeat colonoscopy in 1 year for surveillance.  Emerging evidence supposed eating a diet of fruits, vegetables, grains, calcium, and yogurt while reducing red meat and alcohol may reduce the risk of colon cancer.  Resume previous diet and continue current medications.  YOU HAD AN ENDOSCOPIC PROCEDURE TODAY AT [facility]:   Refer to the procedure report that was given to you for any specific questions about what was found during the examination.  If the procedure report does not answer your questions, please call your gastroenterologist to clarify.  If you requested that your care partner not be given the details of your procedure findings, then the procedure report has been included in a sealed envelope for you to review at your convenience later.  YOU SHOULD EXPECT: Some feelings of bloating in the abdomen. Passage of more gas than usual.  Walking can help get rid of the air that was put into your GI tract during the procedure and reduce the bloating. If you had a lower endoscopy (such as a colonoscopy or flexible sigmoidoscopy) you may notice spotting of blood in your stool or on the toilet paper. If you underwent a bowel prep for your procedure, you may not have a normal bowel movement for a few days.  Please Note:  You might notice some irritation and congestion in your nose or some drainage.  This is from the oxygen used during your procedure.  There is no need for concern and it should clear up in a day or so.  SYMPTOMS TO REPORT IMMEDIATELY:  Following lower endoscopy (colonoscopy or flexible sigmoidoscopy):  Excessive amounts of blood in the stool  Significant tenderness or worsening of abdominal pains  Swelling of the abdomen that is new, acute  Fever of 100F or higher   For urgent or emergent issues, a gastroenterologist can be reached at any hour by calling [phone]. Do not use MyChart  messaging for urgent concerns.    DIET:  We do recommend a small meal at first, but then you may proceed to your regular diet.  Drink plenty of fluids but you should avoid alcoholic beverages for 24 hours.  ACTIVITY:  You should plan to take it easy for the rest of today and you should NOT DRIVE or use heavy machinery until tomorrow (because of the sedation medicines used during the test).    FOLLOW UP: Our staff will call the number listed on your records 48-72 hours following your procedure to check on you and address any questions or concerns that you may have regarding the information given to you following your procedure. If we do not reach you, we will leave a message.  We will attempt to reach you two times.  During this call, we will ask if you have developed any symptoms of COVID 19. If you develop any symptoms (ie: fever, flu-like symptoms, shortness of breath, cough etc.) before then, please call [phone].  If you test positive for Covid 19 in the 2 weeks post procedure, please call and report this information to [place].    If any biopsies were taken you will be contacted by phone or by letter within the next 1-3 weeks.  Please call us at [phone] if you have not heard about the biopsies in 3 weeks.    SIGNATURES/CONFIDENTIALITY: You and/or your care partner have signed paperwork which will be entered into your electronic medical record.  These signatures attest to the  fact that that the information above on your After Visit Summary has been reviewed and is understood.  Full responsibility of the confidentiality of this discharge information lies with you and/or your care-partner.

## 2021-03-31 NOTE — Progress Notes (Signed)
PT taken to PACU. Monitors in place. VSS. Report given to RN. 

## 2021-03-31 NOTE — Op Note (Signed)
[facility] Patient Name: Jordan Martinez Procedure Date: 03/31/2021 8:33 AM MRN: [id number] Endoscopist: Thornton Park MD, MD Age: 70 Referring MD:  Date of Birth: 15-Mar-1951 Gender: Female Account #: [id number] Procedure:                Colonoscopy Indications:              MSH6-related Lynch syndrome                           Stage 1A grade 3 endometrial cancer                           Family history of colon cancer (daughter at age 61)                           Personal history of polyps                           - hyperplastic polyps on colonoscopy in [place]                           - 4 tubular adenomas 12/23/2018                           - 1 tubular adenoma, 2 SSP 03/14/20 Medicines:                Monitored Anesthesia Care Procedure:                Pre-Anesthesia Assessment:                           - Prior to the procedure, a History and Physical                            was performed, and patient medications and                            allergies were reviewed. The patient's tolerance of                            previous anesthesia was also reviewed. The risks                            and benefits of the procedure and the sedation                            options and risks were discussed with the patient.                            All questions were answered, and informed consent                            was obtained. Prior Anticoagulants: The patient has  taken no previous anticoagulant or antiplatelet                            agents. ASA Grade Assessment: IV - A patient with                            severe systemic disease that is a constant threat                            to life. After reviewing the risks and benefits,                            the patient was deemed in satisfactory condition to                            undergo the procedure.                           After obtaining informed consent, the  colonoscope                            was passed under direct vision. Throughout the                            procedure, the patient's blood pressure, pulse, and                            oxygen saturations were monitored continuously. The                            Olympus CF-HQ190L ([id number]) Colonoscope was                            introduced through the anus and advanced to the 3                            cm into the ileum. A second forward view of the                            right colon was performed. The colonoscopy was                            performed without difficulty. The patient tolerated                            the procedure well. The quality of the bowel                            preparation was excellent. The terminal ileum,                            ileocecal valve, appendiceal orifice, and rectum  were photographed. Scope In: 8:44:53 AM Scope Out: 8:59:37 AM Scope Withdrawal Time: 0 hours 11 minutes 35 seconds  Total Procedure Duration: 0 hours 14 minutes 44 seconds  Findings:                 The perianal and digital rectal examinations were                            normal.                           Multiple small and large-mouthed diverticula were                            found in the sigmoid colon and distal descending                            colon.                           Multiple sessile, hyperplastic appearing polyps                            were found in the rectum. The polyps were                            diminutive in size. Polypectomy was not attempted.                           Two polyps were seen in the distant sigmoid colon.                            The polyps were flat and ranged from 3-50m. The                            polyps were removed with a cold snare. Resection                            and retrieval were complete. Estimated blood loss                            was minimal.                            A less than 1 mm polyp was found in the proximal                            ascending colon. The polyp was flat. The polyp was                            removed with a cold biopsy forceps. Resection and                            retrieval were complete. Estimated blood loss was  minimal.                           The exam was otherwise without abnormality on                            direct and retroflexion views except for internal                            hemorrhoids. Complications:            No immediate complications. Estimated blood loss:                            Minimal. Estimated Blood Loss:     Estimated blood loss was minimal. Impression:               - Diverticulosis in the sigmoid colon and distal                            descending colon.                           - Multiple diminutive polyps in the rectum.                            Resection not attempted.                           - One 2 mm polyp in the sigmoid colon, removed with                            a cold snare. Resected and retrieved.                           - One less than 1 mm polyp in the proximal                            ascending colon, removed with a cold biopsy                            forceps. Resected and retrieved.                           - The examination was otherwise normal on direct                            and retroflexion views. Recommendation:           - Patient has a contact number available for                            emergencies. The signs and symptoms of potential                            delayed complications were discussed with the  patient. Return to normal activities tomorrow.                            Written discharge instructions were provided to the                            patient.                           - Resume previous diet.                           - Continue present medications.                            - Await pathology results.                           - Repeat colonoscopy in 1 year for surveillance.                           - Emerging evidence supports eating a diet of                            fruits, vegetables, grains, calcium, and yogurt                            while reducing red meat and alcohol may reduce the                            risk of colon cancer.                           - Thank you for allowing me to be involved in your                            colon cancer prevention. Thornton Park MD, MD 03/31/2021 9:07:51 AM This report has been signed electronically.

## 2021-03-31 NOTE — Progress Notes (Signed)
Pt's states no medical or surgical changes since previsit or office visit.   CHECK-IN-AER  V/S-CW

## 2021-03-31 NOTE — Progress Notes (Signed)
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 female presents for surveillance colonoscopy. She has a history of MSH6-related Lynch Syndrome. Last colonoscopy 03/14/20.     Past Medical History:  Diagnosis Date   Allergy    Anxiety    on meds   Arthritis    on PRN meds   Cataract    bilateral cat. ext. with lens implant- in [place]   Diabetes mellitus without complication ([name])    diet controlled. States always has borderline HgbA1C   Endometrial cancer ([employer])    endometrial CA-2019   Family history of colon cancer    daughter 65 colon CA   Family history of genetic mutation for hereditary nonpolyposis colorectal cancer ([facility])    GERD (gastroesophageal reflux disease)    on meds   High cholesterol    on meds   History of kidney stones    years    Hypertension    on meds   Neuromuscular disorder ([place])    Neuropathy    Sensation of pressure in bladder area    Stroke ([place]) 07/17/2019   Uterine cramping     Past Surgical History:  Procedure Laterality Date   burnt the nerves  Right 04/29/2018   in right knee    burnt the nerves  Left 04/22/2018   left knee nerve    COLONOSCOPY  12/2018   Kharma Sampsel- 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   cystoscopy kidney stone   1985   HYSTERECTOMY ABDOMINAL WITH SALPINGO-OOPHORECTOMY  2019   IR CT HEAD LTD  07/18/2019   IR IMAGING GUIDED PORT INSERTION  06/15/2018   IR  PERCUTANEOUS ART THROMBECTOMY/INFUSION INTRACRANIAL INC DIAG ANGIO  07/18/2019   IR REMOVAL TUN ACCESS W/ PORT W/O FL MOD SED  12/01/2018   LOOP RECORDER INSERTION N/A 07/19/2019   Procedure: LOOP RECORDER INSERTION;  Surgeon: Constance Haw, MD;  Location: [facility];  Service: Cardiovascular;  Laterality: N/A;   POLYPECTOMY     RADIOLOGY WITH ANESTHESIA N/A 07/17/2019   Procedure: IR WITH ANESTHESIA;  Surgeon: Luanne Bras, MD;  Location: [facility];  Service: Radiology;  Laterality: N/A;   REPLACEMENT TOTAL KNEE BILATERAL Bilateral 2018   Right x 2. 2019 undergoing intermittent injection "RFA" procedures with Dr. Sheliah Mends [facility]   REVISION TOTAL KNEE ARTHROPLASTY Right 03/2017   ROBOTIC ASSISTED TOTAL HYSTERECTOMY WITH BILATERAL SALPINGO OOPHERECTOMY N/A 05/03/2018   Procedure: XI ROBOTIC ASSISTED TOTAL HYSTERECTOMY WITH BILATERAL SALPINGO OOPHORECTOMY;  Surgeon: Everitt Amber, MD;  Location: [facility];  Service: Gynecology;  Laterality: N/A;   SENTINEL NODE BIOPSY N/A 05/03/2018   Procedure: SENTINEL NODE BIOPSY;  Surgeon: Everitt Amber, MD;  Location: [facility];  Service: Gynecology;  Laterality: N/A;   TONSILLECTOMY AND ADENOIDECTOMY     TUBAL LIGATION      Current Outpatient Medications  Medication Sig Dispense Refill   amitriptyline (ELAVIL) 10 MG tablet  Take 1 tablet (10 mg total) by mouth at bedtime as needed for sleep (migraine). (Patient taking differently: Take 10 mg by mouth at bedtime as needed for sleep (migraine). Back up to the 25 mg if needed) 15 tablet 5   amitriptyline (ELAVIL) 25 MG tablet Take 1 tablet (25 mg total) by mouth at bedtime. 90 tablet 3   aspirin EC 81 MG tablet Take 81 mg by mouth daily.     atenolol (TENORMIN) 50 MG tablet Take 75 mg by mouth daily.     atorvastatin (LIPITOR) 40 MG tablet Take 1 tablet (40 mg total) by mouth at bedtime. 30 tablet 2   Biotin 5000 MCG TABS Take 5,000 mcg by mouth daily.     Cholecalciferol (VITAMIN D3) 50 MCG  (2000 UT) TABS Take 1 tablet by mouth daily in the afternoon.     clotrimazole (LOTRIMIN) 1 % cream APPLY TO AFFECTED AREA TWICE A DAY     hydrocortisone 2.5 % cream Apply 1 application topically 2 (two) times daily.     Lancets (ONETOUCH DELICA PLUS UXYBFX83A) MISC Apply topically daily.     lisinopril (ZESTRIL) 10 MG tablet Take 10 mg by mouth daily.     metFORMIN (GLUCOPHAGE) 500 MG tablet TAKE 1 TABLET BY MOUTH TWICE A DAY WITH MEALS 60 tablet 1   Omega-3 Fatty Acids (FISH OIL) 1200 MG CAPS Take 1,200 mg by mouth daily.      omeprazole (PRILOSEC) 40 MG capsule Take 1 capsule (40 mg total) by mouth daily. 90 capsule 3   ONETOUCH ULTRA test strip USE TO CHECK BLOOD SUGAR ONCE DAILY E11.69     Propylene Glycol 0.6 % SOLN Place 2 drops into both eyes 4 (four) times daily.     acetaminophen (TYLENOL) 500 MG tablet Take 1,000 mg by mouth once as needed for mild pain or moderate pain. Extra strength     amoxicillin (AMOXIL) 500 MG capsule Take 2,000 mg by mouth once as needed (dental work).     cyclobenzaprine (FLEXERIL) 5 MG tablet Take 1 tablet (5 mg total) by mouth 3 (three) times daily. (Patient taking differently: Take 5-10 mg by mouth 3 (three) times daily as needed for muscle spasms.) 90 tablet 1   Current Facility-Administered Medications  Medication Dose Route Frequency Provider Last Rate Last Admin   0.9 %  sodium chloride infusion  500 mL Intravenous Once Thornton Park, MD        Allergies as of 03/31/2021 - Review Complete 03/31/2021  Allergen Reaction Noted   Amlodipine  03/31/2018   Compazine [prochlorperazine edisylate]  03/31/2018   Gabapentin  03/31/2018   Lyrica [pregabalin]  03/31/2018   Sertraline Other (See Comments) 04/12/2018   Chlorhexidine Rash 08/01/2018    Family History  Adopted: Yes  Problem Relation Age of Onset   Colon cancer Daughter 19       Lynch syndrome - MSH6 +   Colon polyps Daughter 29   Healthy Son    Esophageal cancer Neg Hx    Rectal  cancer Neg Hx    Stomach cancer Neg Hx      Physical Exam: General:   Alert,  well-nourished, pleasant and cooperative in NAD Head:  Normocephalic and atraumatic. Eyes:  Sclera clear, no icterus.   Conjunctiva pink. Mouth:  No deformity or lesions.   Neck:  Supple; no masses or thyromegaly. Lungs:  Clear throughout to auscultation.   No wheezes. Heart:  Regular rate and rhythm; no murmurs. Abdomen:  Soft,  non-tender, nondistended, normal bowel sounds, no rebound or guarding.  Msk:  Symmetrical. No boney deformities LAD: No inguinal or umbilical LAD Extremities:  No clubbing or edema. Neurologic:  Alert and  oriented x4;  grossly nonfocal Skin:  No obvious rash or bruise. Psych:  Alert and cooperative. Normal mood and affect.     Studies/Results: No results found.    Jermeka Schlotterbeck L. Tarri Glenn, MD, MPH 03/31/2021, 8:34 AM

## 2021-04-02 ENCOUNTER — Telehealth: Payer: Self-pay

## 2021-04-02 ENCOUNTER — Ambulatory Visit: Payer: Medicare Other | Admitting: Gynecologic Oncology

## 2021-04-02 NOTE — Telephone Encounter (Signed)
  Follow up Call-  Call back number 03/31/2021 03/14/2020 12/23/2018  Post procedure Call Back phone  # [phone]  Permission to leave phone message Yes Yes Yes  Some recent data might be hidden     Patient questions:  Do you have a fever, pain , or abdominal swelling? No. Pain Score  0 *  Have you tolerated food without any problems? Yes.    Have you been able to return to your normal activities? Yes.    Do you have any questions about your discharge instructions: Diet   No. Medications  No. Follow up visit  No.  Do you have questions or concerns about your Care? No.  Actions: * If pain score is 4 or above: No action needed, pain <4.  Have you developed a fever since your procedure? no  2.   Have you had an respiratory symptoms (SOB or cough) since your procedure? no  3.   Have you tested positive for COVID 19 since your procedure no  4.   Have you had any family members/close contacts diagnosed with the COVID 19 since your procedure?  no   If yes to any of these questions please route to [name], RN and Joella Prince, RN

## 2021-04-08 ENCOUNTER — Other Ambulatory Visit: Payer: Self-pay

## 2021-04-08 ENCOUNTER — Ambulatory Visit (HOSPITAL_COMMUNITY)
Admission: RE | Admit: 2021-04-08 | Discharge: 2021-04-08 | Disposition: A | Discharge status: Home / Self Care | Payer: Medicare Other | Source: Ambulatory Visit | Attending: Adult Health | Admitting: Adult Health

## 2021-04-08 ENCOUNTER — Ambulatory Visit: Payer: Medicare Other | Admitting: Adult Health

## 2021-04-08 ENCOUNTER — Encounter: Payer: Self-pay | Admitting: *Deleted

## 2021-04-08 DIAGNOSIS — R410 Disorientation, unspecified: Secondary | ICD-10-CM | POA: Diagnosis not present

## 2021-04-08 DIAGNOSIS — R569 Unspecified convulsions: Secondary | ICD-10-CM | POA: Diagnosis not present

## 2021-04-08 NOTE — Progress Notes (Signed)
EEG Completed; Results Pending  

## 2021-04-08 NOTE — Procedures (Signed)
Patient Name: Jordan Martinez  MRN: [id number]  Epilepsy Attending: Lora Havens  Referring Physician/Provider: Frann Rider, NP Date: 04/08/2021 Duration: 22.48 mins  Patient history: 70 year old female with episodes of transient confusion.  EEG to evaluate for seizures.  Level of alertness: Awake  AEDs during EEG study: None  Technical aspects: This EEG study was done with scalp electrodes positioned according to the 10-20 International system of electrode placement. Electrical activity was acquired at a sampling rate of 500Hz  and reviewed with a high frequency filter of 70Hz  and a low frequency filter of 1Hz . EEG data were recorded continuously and digitally stored.   Description: The posterior dominant rhythm consists of 9-10 Hz activity of moderate voltage (25-35 uV) seen predominantly in posterior head regions, symmetric and reactive to eye opening and eye closing. Physiologic photic driving was seen during photic stimulation.  Hyperventilation was not performed.     IMPRESSION: This study is within normal limits. No seizures or epileptiform discharges were seen throughout the recording.  Jordan Martinez

## 2021-04-09 ENCOUNTER — Telehealth: Payer: Self-pay | Admitting: Neurology

## 2021-04-09 ENCOUNTER — Encounter: Payer: Self-pay | Admitting: Neurology

## 2021-04-09 ENCOUNTER — Encounter: Payer: Self-pay | Admitting: Gastroenterology

## 2021-04-09 NOTE — Telephone Encounter (Deleted)
Kindly inform the patient that EEG study was normal

## 2021-04-10 ENCOUNTER — Telehealth: Payer: Self-pay | Admitting: Neurology

## 2021-04-10 NOTE — Telephone Encounter (Signed)
I spoke to the patient and notified her of the EEG results.

## 2021-04-10 NOTE — Telephone Encounter (Signed)
Kindly inform the patient that EEG study was normal

## 2021-04-21 ENCOUNTER — Ambulatory Visit (INDEPENDENT_AMBULATORY_CARE_PROVIDER_SITE_OTHER): Payer: Medicare Other

## 2021-04-21 DIAGNOSIS — I63412 Cerebral infarction due to embolism of left middle cerebral artery: Secondary | ICD-10-CM | POA: Diagnosis not present

## 2021-04-23 LAB — CUP PACEART REMOTE DEVICE CHECK
Date Time Interrogation Session: 20221007000808
Implantable Pulse Generator Implant Date: 20210113

## 2021-04-29 NOTE — Progress Notes (Signed)
Carelink Summary Report / Loop Recorder 

## 2021-05-24 ENCOUNTER — Other Ambulatory Visit: Payer: Self-pay | Admitting: Adult Health

## 2021-05-26 ENCOUNTER — Ambulatory Visit (INDEPENDENT_AMBULATORY_CARE_PROVIDER_SITE_OTHER): Payer: Medicare Other

## 2021-05-26 DIAGNOSIS — I63412 Cerebral infarction due to embolism of left middle cerebral artery: Secondary | ICD-10-CM

## 2021-05-27 LAB — CUP PACEART REMOTE DEVICE CHECK
Date Time Interrogation Session: 20221120231143
Implantable Pulse Generator Implant Date: 20210113

## 2021-06-03 NOTE — Progress Notes (Signed)
Carelink Summary Report / Loop Recorder 

## 2021-06-11 ENCOUNTER — Telehealth: Payer: Self-pay | Admitting: *Deleted

## 2021-06-11 ENCOUNTER — Other Ambulatory Visit: Payer: Self-pay | Admitting: Adult Health

## 2021-06-11 NOTE — Telephone Encounter (Signed)
Patient called and scheduled a follow up appt with Dr Tucker  

## 2021-06-17 ENCOUNTER — Encounter: Payer: Self-pay | Admitting: Adult Health

## 2021-06-17 ENCOUNTER — Ambulatory Visit (INDEPENDENT_AMBULATORY_CARE_PROVIDER_SITE_OTHER): Payer: Medicare Other | Admitting: Adult Health

## 2021-06-17 ENCOUNTER — Other Ambulatory Visit: Payer: Self-pay

## 2021-06-17 VITALS — BP 127/81 | HR 72 | Ht 64.0 in | Wt 189.0 lb

## 2021-06-17 DIAGNOSIS — I639 Cerebral infarction, unspecified: Secondary | ICD-10-CM | POA: Diagnosis not present

## 2021-06-17 DIAGNOSIS — G43709 Chronic migraine without aura, not intractable, without status migrainosus: Secondary | ICD-10-CM

## 2021-06-17 DIAGNOSIS — R202 Paresthesia of skin: Secondary | ICD-10-CM

## 2021-06-17 MED ORDER — AMITRIPTYLINE HCL 25 MG PO TABS: 25.0000 mg | ORAL_TABLET | Freq: Every day | ORAL | 3 refills | Status: DC

## 2021-06-17 MED ORDER — AMITRIPTYLINE HCL 10 MG PO TABS: 10.0000 mg | ORAL_TABLET | Freq: Every day | ORAL | 3 refills | Status: DC

## 2021-06-17 NOTE — Progress Notes (Signed)
[facility] [address] [place]. [place] [address] [phone]       STROKE FOLLOW UP NOTE  Ms. Janvi Ammar Date of Birth:  [birth year] Medical Record Number:  [id number]   Reason for Referral: neck pain and stroke follow up    CHIEF COMPLAINT:  Chief Complaint  Patient presents with   Follow-up    Rm 3  alone Pt is well, still having some pain/tingling in neck/side of face but it has improved.  No new concerns        HPI:  Update 06/16/2021 JM:  Patient returns for follow-up unaccompanied. Right facial numbness and tingling and posterior neck pain significantly improved. Has finished physical therapy for her neck and continues exercises at home. No additional headaches. No additional transient confusion episodes - MR brain and EEG unremarkable. Denies new stroke/TIA symptoms. Remains on Atorvastatin and Aspirin without side effects and Amitriptyline 35 mg nightly for headaches and facial pain. BP today 127/81. Pt reports A1C of 6.4, unable to view in [facility], next labs in January.  Loop recorder has not shown atrial fibrillation thus far.  She plans on traveling to [place] soon to visit family (she has not been able to over the past 3 years due to medical condition).  No further concerns at this time.   History provided for reference purposes only Previously on 03/18/2021: Ms. Jaso returns for 70-monthfollow-up unaccompanied.  She reports continued right facial numbness/tingling (more so jaw and side of neck) and posterior neck pain but overall improving currently working with PT.  Mainly symptoms only present in the evening.  She remains on amitriptyline 25 mg nightly and has not experienced any recent headaches.  Discussed use of extra 10 mg tablet as needed for breakthrough headaches as well as worsening nerve pain as needed but forget this was discussed and has not yet trialed extra dose as she has not had any additional headaches.  She does report  approximately 2 weeks ago 2 separate episodes of transient confusion - 1 while trying to pump gas and forgot steps (lasted less than 1 min) and another when she mixed up checks to be mailed with her recycled papers (as both recycling and mailbox in the same vicinity where she resides) and ended up putting her checks in the recycling.  She shortly realized what she did and was able to recover her checks and mail them appropriately.  No other associated symptoms or neurological type symptoms.  She did experience similar symptoms after her prior stroke but no reoccurrence since then. No additional events since that time.  Reports compliance on aspirin and atorvastatin.  Blood pressure today 124/66. No further concerns at this time.   Update 01/14/2021 JM: Ms. YAveryreturns for sooner follow-up visit unaccompanied to discuss imaging and further treatment options after prior visit with Dr. SLeonie Man1 month ago for intermittent right lower face and neck paresthesias that first presented around 10/2020. MR brain w/wo 12/29/2020 showed chronic left temporal and left insular ischemic infarcts but no evidence of acute findings. MR cervical 12/29/2020 degenerative changes with mild narrowing C4, C5 and C6 and severe left foraminal stenosis at C3-4 and severe bilateral foraminal stenosis C4-5.  She continues to experience symptoms without new characteristics or features or worsening.  Typically worse in the evening associated with increase neck pain and stiffness.  Denies any radiating symptoms into her arms.  She has remained on amitriptyline 25 mg nightly for migraine prophylaxis with occasional 10 mg dose as  needed.  Stable from stroke standpoint without new or reoccurring stroke/TIA symptoms.  Compliant on aspirin and atorvastatin without associated side effects.  Blood pressure today 134/84.  Loop recorder negative for atrial fibrillation thus far.  Update 12/18/2020 Dr. Leonie Man: Ms. Mines is a pleasant 70 year old Caucasian  lady seen today for office consultation visit right face and neck numbness.  History is obtained from the patient and review of referral notes and I personally reviewed electronic medical records in Care Everywhere and pertinent imaging films in PACS.  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  Patient has followed up for stroke follow-up in the clinic and was last seen by [facility] nurse practitioner on 10/01/2020.  Patient has a new complaint of tingling and numbness involving the outer aspect of the lower right face as well as neck.  This began about 2 months ago.  Initially this feeling would go down into the right arm as well as forearm.  This lasted only few days and since then she has been having intermittent tingling sensation on the outer aspect of the right lower face as well as the neck.  This may occur a couple of times a day.  Lasts only 1 to 2 minutes.  There are no specific triggers.  This is annoying but not very bothersome.  Occasionally she has discomfort and pain in the base of her neck.  She denies any true radicular pain or severe neck pain.  There is no history of neck injury, motor vehicle accident.  She recently saw her orthopedic physician and [place] who did x-rays of the cervical spine which showed mild disc degenerative changes.  Patient also has a history of headaches following her stroke and was prescribed Elavil 25 mg at night which seems to help she still has headaches about couple of times a week but they are tolerable and not as bothersome.  She is currently on aspirin for stroke prevention and is having mild bruising but no major bleeding episodes.  He is tolerating Lipitor well with only occasional leg cramps.  Blood pressure is usually well  controlled at home the today it is elevated in office at 159/85.  She plans to see a primary care physician later this month and have follow-up lipid profile and hemoglobin A1c checked.  She is extremely anxious that she may have cancer and worries a lot about this.  She has a daughter in [place] who is undergoing surgery this week and patient had to cancel her trip and she wants to finish her work-up prior to going there.  Update 10/01/2020 JM: Ms. Gelder returns for stroke and headache 2-monthfollow-up accompanied by her friend, EMikel Cellafrom a stroke standpoint without new or recurring stroke/TIA symptoms Reports compliance on aspirin and atorvastatin -denies associated side effects Blood pressure today 135/83 Loop recorder has not shown atrial fibrillation thus far  Headaches have been stable only experiencing one migraine since prior visit. She took an additional amitriptyline 25 mg tablet with resolution.  She will experience very mild headaches approximately 2-3 times a week typically after dinner prior to schedule amitriptyline dosage but will quickly resolve after taking amitriptyline.  EPercell Millerreports some difficulty with memory and anxiety She will have difficulty remembering numbers such as her address or cell  phone password -this is been consistent since her stroke She feels as though her anxiety is greatly improving where she is not consistently fearful of having additional strokes and is sleeping better at night  She is being followed by pain clinic for b/l knee pain recently receiving COOLIEF therapy by Dr. Franchot Mimes but denies much benefit.  She has scheduled follow-up visit with him tomorrow for further discussion  No further concerns at this time  Update 04/01/2020 JM patient returns for stroke f/u. Stable from stroke standpoint since prior visit without residual deficits. Denies new stroke/TIA symptoms. Chronic headaches stable on amitriptyline 13m nightly but does report  morning grogginess.  Remains on aspirin 878mdaily and atorvastatin without side effects. Blood pressure today 138/78. Loop recorder has not shown atrial fibrillation thus far.  She continues to be followed by pain management for bilateral knee osteoarthritis.  Does report occasional anxiety due to medical conditions but overall stable.  No further concerns at this time.  Update 11/29/2019 JM: Ms. YiAdameks being seen for follow-up regarding left MCA stroke in 07/2018.  She has been doing well from a stroke standpoint without new or reoccurring stroke/TIA symptoms.  Underlying chronic headaches worsened post stroke and initiated amitriptyline with eventual increase to [id number] great benefit. Reports mild side effects such as mild fatigue the following day and mild skin photosensitivity. She does endorse improvement of anxiety as well with use of amitriptyline. Continues on aspirin and atorvastatin for secondary stroke prevention.  Blood pressure today 118/62.  Loop recorder has not shown atrial fibrillation thus far.  No further concerns at this time.  Initial visit 08/23/2019 JM: Ms. YirBallinger a [age]ar old female who is being seen today for hospital follow up.  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  Prior history of frontal headaches worsened with eyestrain and was told due to prior chemo treatments.  Headache intensity worsened post stroke gradually returning to baseline but continues to experience frequently.  At times she also experienced cloudiness but this has been unchanged.  She will be seeing her ophthalmologist next week for 1 year follow-up.  Describes headaches as a pressure sensation in frontal region and behind eyes bilaterally associated with photophobia and phonophobia but denies nausea/vomiting.  She will occasionally experience small flicker of light in her periphery prior to headache onset and is unsure if  headaches are worse when she experiences visual aura.  She has not previously been treated for headaches or been diagnosed with migraine headaches.  She also endorses increased anxiety due to fear of recurrent stroke.  She has completed 3 weeks DAPT and continues on aspirin alone without bleeding or bruising.  Continues on atorvastatin 40 mg daily without myalgias.  Blood pressure today satisfactory at 130/70.  She has not had follow-up with IR as recommended post revascularization during admission.  Loop recorder is not shown atrial fibrillation thus far.  No further concerns at this time.  Hospital admission 07/17/2019: Ms. ValDezirea Mccollister a [age]o. female with history of diabetes, endometrial cancer, HNPCC, hyperlipidemia, hypertension who presented on 07/17/2019 with aphasia.  Evaluated by stroke team and  Dr. Xu Erlinda Hongth stroke work-up revealing left MCA infarct due to left M2 occlusion s/p TPA and IR with TICI 3 revascularization of distal left M2 with resultant small SAH left sylvian fissure, infarct embolic secondary to unknown source.  2D echo unremarkable.  LE venous Doppler negative.  Loop  recorder placed to rule out A. fib as potential stroke etiology.  Recommended DAPT for 3 weeks and aspirin alone.  Initiated atorvastatin 40 mg daily with LDL 120.  HTN stable.  Controlled DM with A1c 7.0.  Current tobacco use with smoking cessation counseling provided.  Other stroke risk factors include advanced age, EtOH use and obesity but no prior history of stroke.  Discharged back to [facility] with recommendation of physical therapy.  Stroke: L MCA infarct due to left M2 occlusion s/p tPA and IR with resultant small SAH left sylvian fissure, infarct embolic secondary to unknown source Code Stroke CT head hyperdense L MCA. ASPECTS 10.    CTA head & neck L M2 MCA ELVO. L ICA 50% stenosis, mild L siphon plaque. Mild atherosclerosis throughout CT perfusion 7 mL core, 53 mL penumbra L MCA territory  Cerebral angio  TICI3 revascularization distal L M2  F/u CT mild SAH L sylvian fissure. No infarct  MRI patchy small L MCA territory infarct (L insula, L frontal operculum, L temporal lobe). Small SAHD L sylvian fissure stable. MRA Unremarkable  2D Echo EF 65-70%. No source of embolus  LE venous doppler No DVT Loop recorder placed 1/13 to look for AF as source of stroke ([place]) LDL 120 HgbA1c 7.0 aspirin 81 mg daily prior to admission, will start aspirin 81 and plavix 75 at d/c. Continue DAPT x 3 weeks then aspirin alone.   Therapy recommendations: continue OP PT  Disposition:  return to [facility]    ROS:   14 system review of systems performed and negative with exception of those listed in HPI  PMH:  Past Medical History:  Diagnosis Date   Allergy    Anxiety    on meds   Arthritis    on PRN meds   Cataract    bilateral cat. ext. with lens implant- in [place]   Diabetes mellitus without complication ([place])    diet controlled. States always has borderline HgbA1C   Endometrial cancer ([place])    endometrial CA-2019   Family history of colon cancer    daughter 67 colon CA   Family history of genetic mutation for hereditary nonpolyposis colorectal cancer (HNPCC)    GERD (gastroesophageal reflux disease)    on meds   High cholesterol    on meds   History of kidney stones    years    Hypertension    on meds   Neuromuscular disorder ([place])    Neuropathy    Sensation of pressure in bladder area    Stroke ([place]) 07/17/2019   Uterine cramping     PSH:  Past Surgical History:  Procedure Laterality Date   burnt the nerves  Right 04/29/2018   in right knee    burnt the nerves  Left 04/22/2018   left knee nerve    COLONOSCOPY  12/2018   [place]- 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   cystoscopy kidney stone   1985   HYSTERECTOMY ABDOMINAL WITH SALPINGO-OOPHORECTOMY  2019   IR CT HEAD LTD  07/18/2019   IR IMAGING GUIDED PORT INSERTION  06/15/2018   IR PERCUTANEOUS ART THROMBECTOMY/INFUSION  INTRACRANIAL INC DIAG ANGIO  07/18/2019   IR REMOVAL TUN ACCESS W/ PORT W/O FL MOD SED  12/01/2018   LOOP RECORDER INSERTION N/A 07/19/2019   Procedure: LOOP RECORDER INSERTION;  Surgeon: Constance Haw, MD;  Location: [facility];  Service: Cardiovascular;  Laterality: N/A;   POLYPECTOMY     RADIOLOGY WITH ANESTHESIA N/A 07/17/2019  Procedure: IR WITH ANESTHESIA;  Surgeon: Luanne Bras, MD;  Location: [facility];  Service: Radiology;  Laterality: N/A;   REPLACEMENT TOTAL KNEE BILATERAL Bilateral 2018   Right x 2. 2019 undergoing intermittent injection "RFA" procedures with Dr. Sheliah Mends [facility]   REVISION TOTAL KNEE ARTHROPLASTY Right 03/2017   ROBOTIC ASSISTED TOTAL HYSTERECTOMY WITH BILATERAL SALPINGO OOPHERECTOMY N/A 05/03/2018   Procedure: XI ROBOTIC ASSISTED TOTAL HYSTERECTOMY WITH BILATERAL SALPINGO OOPHORECTOMY;  Surgeon: Everitt Amber, MD;  Location: [facility];  Service: Gynecology;  Laterality: N/A;   SENTINEL NODE BIOPSY N/A 05/03/2018   Procedure: SENTINEL NODE BIOPSY;  Surgeon: Everitt Amber, MD;  Location: [facility];  Service: Gynecology;  Laterality: N/A;   TONSILLECTOMY AND ADENOIDECTOMY     TUBAL LIGATION      Social History:  Social History   Socioeconomic History   Marital status: Divorced    Spouse name: Not on file   Number of children: 2   Years of education: Not on file   Highest education level: Not on file  Occupational History   Occupation: retired Marine scientist  Tobacco Use   Smoking status: Every Day    Packs/day: 0.25    Years: 40.00    Pack years: 10.00    Types: Cigarettes   Smokeless tobacco: Never   Tobacco comments:    4-5 loose cigarettes per day , over 20 year hx of smoking   Vaping Use   Vaping Use: Never used  Substance and Sexual Activity   Alcohol use: Yes    Alcohol/week: 14.0 standard drinks    Types: 14 Glasses of wine per week    Comment: about 2 glasses wine daily.    Drug use: Never   Sexual activity: Not Currently     Birth control/protection: None  Other Topics Concern   Not on file  Social History Narrative   Not on file   Social Determinants of Health   Financial Resource Strain: Not on file  Food Insecurity: Not on file  Transportation Needs: Not on file  Physical Activity: Not on file  Stress: Not on file  Social Connections: Not on file  Intimate Partner Violence: Not on file    Family History:  Family History  Adopted: Yes  Problem Relation Age of Onset   Colon cancer Daughter 7       Lynch syndrome - MSH6 +   Colon polyps Daughter 17   Healthy Son    Esophageal cancer Neg Hx    Rectal cancer Neg Hx    Stomach cancer Neg Hx     Medications:   Current Outpatient Medications on File Prior to Visit  Medication Sig Dispense Refill   acetaminophen (TYLENOL) 500 MG tablet Take 1,000 mg by mouth once as needed for mild pain or moderate pain. Extra strength     amitriptyline (ELAVIL) 10 MG tablet TAKE 1 TABLET (10 MG TOTAL) BY MOUTH AT BEDTIME AS NEEDED FOR SLEEP (MIGRAINE). 15 tablet 5   amitriptyline (ELAVIL) 25 MG tablet Take 1 tablet (25 mg total) by mouth at bedtime. 90 tablet 3   amoxicillin (AMOXIL) 500 MG capsule Take 2,000 mg by mouth once as needed (dental work).     aspirin EC 81 MG tablet Take 81 mg by mouth daily.     atenolol (TENORMIN) 50 MG tablet Take 75 mg by mouth daily.     atorvastatin (LIPITOR) 40 MG tablet Take 1 tablet (40 mg total) by mouth at bedtime. 30 tablet 2  Biotin 5000 MCG TABS Take 5,000 mcg by mouth daily.     Cholecalciferol (VITAMIN D3) 50 MCG (2000 UT) TABS Take 1 tablet by mouth daily in the afternoon.     cyclobenzaprine (FLEXERIL) 5 MG tablet Take 1 tablet (5 mg total) by mouth 3 (three) times daily. (Patient taking differently: Take 5-10 mg by mouth 3 (three) times daily as needed for muscle spasms.) 90 tablet 1   Lancets (ONETOUCH DELICA PLUS [id number]) MISC Apply topically daily.     lisinopril (ZESTRIL) 10 MG tablet Take 10 mg by mouth  daily.     metFORMIN (GLUCOPHAGE) 500 MG tablet TAKE 1 TABLET BY MOUTH TWICE A DAY WITH MEALS 60 tablet 1   Omega-3 Fatty Acids (FISH OIL) 1200 MG CAPS Take 1,200 mg by mouth daily.      omeprazole (PRILOSEC) 40 MG capsule Take 1 capsule (40 mg total) by mouth daily. 90 capsule 3   ONETOUCH ULTRA test strip USE TO CHECK BLOOD SUGAR ONCE DAILY E11.69     Propylene Glycol 0.6 % SOLN Place 2 drops into both eyes 4 (four) times daily.     No current facility-administered medications on file prior to visit.    Allergies:   Allergies  Allergen Reactions   Amlodipine     Blurred vision    Compazine [Prochlorperazine Edisylate]     Stroke symptoms,   Gabapentin     Blurred vision and nystagmus   Lyrica [Pregabalin]     Blurred Vision    Sertraline Other (See Comments)    Visual disturbances.    Chlorhexidine Rash    Blisters and redness to chest     Physical Exam  Vitals:   06/17/21 1053  BP: 127/81  Pulse: 72  Weight: 189 lb (85.7 kg)  Height: $Remove'5\' 4"'zTylEPI$  (1.626 m)   Body mass index is 32.44 kg/m. No results found.  General: well developed, well nourished, pleasant middle-age Caucasian female, seated, in no evident distress Head: head normocephalic and atraumatic.   Neck: supple with no carotid or supraclavicular bruits Cardiovascular: regular rate and rhythm, no murmurs Musculoskeletal: No deformity Skin:  no rash/petichiae Vascular:  Normal pulses all extremities   Neurologic Exam Mental Status: Awake and fully alert. Fluent speech and language. Oriented to place and time. Recent  and remote memory intact. Attention span, concentration and fund of knowledge appropriate during visit. Mood and affect appropriate.  Cranial Nerves: Pupils equal, briskly reactive to light. Extraocular movements full without nystagmus. Visual fields full to confrontation. Hearing intact. Facial sensation intact. Face, tongue, palate moves normally and symmetrically.  Motor: Normal bulk and tone.  Normal strength in all tested extremity muscles. Sensory: intact to touch, pinprick, position and vibratory sensation.  Coordination: Rapid alternating movements normal in all extremities. Finger-to-nose and heel-to-shin performed accurately bilaterally. Gait and Station: Arises from chair without difficulty. Stance is normal. Gait demonstrates normal stride length and balance without use of assistive device Reflexes: 1+ and symmetric. Toes downgoing.        ASSESSMENT: Anessia Oakland is a 70 y.o. 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 Loop recorder placed.  Vascular risk factors include history of endometrial cancer with completion of chemo 2019, HTN, HLD, and DM.  Evaluated by Dr. Leonie Man 12/18/2020 for 54-month onset of intermittent right facial numbness and tingling     PLAN:  Face/neck paresthesias:  Unknown etiology present since 10/2020 - continued improvement - continue to do exercises Continue amitriptyline 35 mg nightly - refill provided Work up unremarkable  Transient confusion No additional events 2 separate episodes on the same day approximately 2 weeks ago - possible TIA vs stroke vs focal seizure MR brain 03/21/21 showed unchanged lateral left temporal lobe and frontal temporal insular stroke unchanged from 07/18/19. EEG 04/08/21 showed no seizures or epileptiform discharges Advised to call office with any reoccurrence  Cryptogenic left MCA stroke :  Continue aspirin 81 mg daily  and atorvastatin 40 mg daily for secondary stroke prevention.   Loop recorder has not shown atrial fibrillation thus far. Will continue to be monitored by cardiology HTN: BP goal 130/90.  Well-controlled on current regimen per PCP HLD: LDL goal<70. Continue atorvastatin 70m daily. DMII: A1c goal<7.0. managed by PCP   Chronic migraines post chemo:  Currently  well controlled  Continue amitriptyline 35 mg nightly - refill provided     Follow up in 6 months or call earlier if needed   CC:  PJavier Glazier MD    I spent 34 minutes of face-to-face and non-face-to-face time with patient.  This included previsit chart review, lab review, study review, order entry, electronic health record documentation, patient education and discussion regarding continued right facial and neck paresthesias with continued improvement, prior transient confusion events, history of cryptogenic stroke as well as secondary stroke prevention measures and aggressive stroke risk factor management, review of loop recorder, chronic migraines and ongoing management and answered all other questions to patients satisfaction  JFrann Rider AGNP-BC  [facility] [address] [place] [address] Phone [phone] [phone]: This document was prepared with digital dictation and possible smart phrase technology. Any transcriptional errors that result from this process are unintentional.

## 2021-06-17 NOTE — Patient Instructions (Addendum)
Your Plan:  Continue amitriptyline 35 mg nightly  Continue routine follow up with PCP for stroke risk factor management    Follow up in 6 months or call earlier if needed      Thank you for coming to see [place] at [facility]. I hope we have been able to provide you high quality care today.  You may receive a patient satisfaction survey over the next few weeks. We would appreciate your feedback and comments so that we may continue to improve ourselves and the health of our patients.

## 2021-07-01 ENCOUNTER — Ambulatory Visit (INDEPENDENT_AMBULATORY_CARE_PROVIDER_SITE_OTHER): Payer: Medicare Other

## 2021-07-01 DIAGNOSIS — I63412 Cerebral infarction due to embolism of left middle cerebral artery: Secondary | ICD-10-CM | POA: Diagnosis not present

## 2021-07-01 LAB — CUP PACEART REMOTE DEVICE CHECK
Date Time Interrogation Session: 20221223230447
Implantable Pulse Generator Implant Date: 20210113

## 2021-07-06 DIAGNOSIS — G459 Transient cerebral ischemic attack, unspecified: Secondary | ICD-10-CM

## 2021-07-06 HISTORY — DX: Transient cerebral ischemic attack, unspecified: G45.9

## 2021-07-09 NOTE — Progress Notes (Signed)
Carelink Summary Report / Loop Recorder 

## 2021-07-29 ENCOUNTER — Telehealth: Payer: Self-pay | Admitting: Adult Health

## 2021-07-29 NOTE — Telephone Encounter (Signed)
Pt's friend, Doug Sou, EMS at pt's home, taking her to hospital due to may have had stroke. Mr. Meriel Flavors would  like a call from Janett Billow, NP to know what to do.

## 2021-07-29 NOTE — Telephone Encounter (Signed)
Thank you for letting me know. Ill check epic to see how she is doing tomorrow. Glad they took her to the hospital with this concern.

## 2021-07-29 NOTE — Telephone Encounter (Signed)
Called friend, on DPR and advised he let EMS evaluate her and take her to hospital where she can be tested and treated as needed. He stated they are taking her to [facility] hospital, verbalized understanding, appreciation.

## 2021-07-31 ENCOUNTER — Ambulatory Visit: Payer: Medicare Other

## 2021-07-31 ENCOUNTER — Encounter: Payer: Self-pay | Admitting: Neurology

## 2021-07-31 LAB — CUP PACEART REMOTE DEVICE CHECK
Date Time Interrogation Session: 20230125230546
Implantable Pulse Generator Implant Date: 20210113

## 2021-07-31 NOTE — Telephone Encounter (Signed)
Looks like she unfortunately did have another stroke. Please have her be seen by Dr. Leonie Man in the next 4-6 weeks for follow up as she was seen at outside hospital. Thank you.

## 2021-07-31 NOTE — Telephone Encounter (Signed)
Appt made with MD in march

## 2021-07-31 NOTE — Telephone Encounter (Signed)
I called the patient. Reports having a CVA on 07/28/21 with instructions to follow up in two weeks. 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

## 2021-08-04 ENCOUNTER — Telehealth: Payer: Self-pay | Admitting: Adult Health

## 2021-08-04 NOTE — Telephone Encounter (Signed)
Handled via pt message dated 07/31/21

## 2021-08-04 NOTE — Telephone Encounter (Signed)
Pt called and LVM stating that she has been put on Plavix and she is wanting to know if it is ok to take her fish oil while taking the Plavix. Please advise.

## 2021-08-11 ENCOUNTER — Ambulatory Visit (INDEPENDENT_AMBULATORY_CARE_PROVIDER_SITE_OTHER): Payer: Medicare Other | Admitting: Neurology

## 2021-08-11 ENCOUNTER — Encounter: Payer: Self-pay | Admitting: Neurology

## 2021-08-11 VITALS — BP 138/84 | HR 80 | Ht 64.0 in | Wt 183.6 lb

## 2021-08-11 DIAGNOSIS — F801 Expressive language disorder: Secondary | ICD-10-CM

## 2021-08-11 DIAGNOSIS — I639 Cerebral infarction, unspecified: Secondary | ICD-10-CM

## 2021-08-11 MED ORDER — COENZYME Q10 30 MG PO CAPS: 200.0000 mg | ORAL_CAPSULE | Freq: Three times a day (TID) | ORAL | 1 refills | Status: DC

## 2021-08-11 MED ORDER — ATORVASTATIN CALCIUM 40 MG PO TABS: 80.0000 mg | ORAL_TABLET | Freq: Every day | ORAL | 2 refills | Status: DC

## 2021-08-11 NOTE — Patient Instructions (Signed)
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 I also advised the patient to eat a healthy diet with plenty of whole grains, cereals, fruits and vegetables, exercise regularly and maintain ideal body weight .add coenzyme every 10 200 mg daily for statin myalgias.  Increase dose of Lipitor to 80 mg daily.  If she is unable to tolerate it may consider changing to PCSK9 inhibitor injections later.  Continue ongoing speech therapy.  Followup in the future with my nurse practitioner Janett Billow in 3 months or call earlier if necessary. Stroke Prevention Some medical conditions and behaviors can lead to a higher chance of having a stroke. You can help prevent a stroke by eating healthy, exercising, not smoking, and managing any medical conditions you have. Stroke is a leading cause of functional impairment. Primary prevention is particularly important because a majority of strokes are first-time events. Stroke changes the lives of not only those who experience a stroke but also their family and other caregivers. How can this condition affect me? A stroke is a medical emergency and should be treated right away. A stroke can lead to brain damage and can sometimes be life-threatening. If a person gets medical treatment right away, there is a better chance of surviving and recovering from a stroke. What can increase my risk? The following medical conditions may increase  your risk of a stroke: Cardiovascular disease. High blood pressure (hypertension). Diabetes. High cholesterol. Sickle cell disease. Blood clotting disorders (hypercoagulable state). Obesity. Sleep disorders (obstructive sleep apnea). Other risk factors include: Being older than age 51. Having a history of blood clots, stroke, or mini-stroke (transient ischemic attack, TIA). Genetic factors, such as race, ethnicity, or a family history of stroke. Smoking cigarettes or using other tobacco products. Taking birth control pills, especially if you also use tobacco. Heavy use of alcohol or drugs, especially cocaine and methamphetamine. Physical inactivity. What actions can I take to prevent this? Manage your health conditions High cholesterol levels. Eating a healthy diet is important for preventing high cholesterol. If cholesterol cannot be managed through diet alone, you may need to take medicines. Take any prescribed medicines to control your cholesterol as told by your health care provider. Hypertension. To reduce your risk of stroke, try to keep your blood pressure below 130/80. Eating a healthy diet and exercising regularly are important for controlling blood pressure. If these steps are not enough to manage your blood pressure, you may need to take medicines. Take any prescribed medicines to control hypertension as told by your health care provider. Ask your health care provider if you should monitor your blood pressure at home. Have your blood pressure checked every year, even if your blood pressure is normal. Blood pressure increases with age and some medical conditions. Diabetes. Eating a healthy diet and exercising regularly are important parts of managing your blood sugar (glucose). If your blood sugar cannot be managed through diet and exercise, you may need to take medicines. Take any prescribed medicines to control your diabetes as told by  your health care provider. Get  evaluated for obstructive sleep apnea. Talk to your health care provider about getting a sleep evaluation if you snore a lot or have excessive sleepiness. Make sure that any other medical conditions you have, such as atrial fibrillation or atherosclerosis, are managed. Nutrition Follow instructions from your health care provider about what to eat or drink to help manage your health condition. These instructions may include: Reducing your daily calorie intake. Limiting how much salt (sodium) you use to 1,500 milligrams (mg) each day. Using only healthy fats for cooking, such as olive oil, canola oil, or sunflower oil. Eating healthy foods. You can do this by: Choosing foods that are high in fiber, such as whole grains, and fresh fruits and vegetables. Eating at least 5 servings of fruits and vegetables a day. Try to fill one-half of your plate with fruits and vegetables at each meal. Choosing lean protein foods, such as lean cuts of meat, poultry without skin, fish, tofu, beans, and nuts. Eating low-fat dairy products. Avoiding foods that are high in sodium. This can help lower blood pressure. Avoiding foods that have saturated fat, trans fat, and cholesterol. This can help prevent high cholesterol. Avoiding processed and prepared foods. Counting your daily carbohydrate intake.  Lifestyle If you drink alcohol: Limit how much you have to: 0-1 drink a day for women who are not pregnant. 0-2 drinks a day for men. Know how much alcohol is in your drink. In the [place] one drink equals one 12 oz bottle of beer (364mL), one 5 oz glass of wine (151mL), or one 1 oz glass of hard liquor (52mL). Do not use any products that contain nicotine or tobacco. These products include cigarettes, chewing tobacco, and vaping devices, such as e-cigarettes. If you need help quitting, ask your health care provider. Avoid secondhand smoke. Do not use drugs. Activity  Try to stay at a healthy weight. Get at least  30 minutes of exercise on most days, such as: Fast walking. Biking. Swimming. Medicines Take over-the-counter and prescription medicines only as told by your health care provider. Aspirin or blood thinners (antiplatelets or anticoagulants) may be recommended to reduce your risk of forming blood clots that can lead to stroke. Avoid taking birth control pills. Talk to your health care provider about the risks of taking birth control pills if: You are over 62 years old. You smoke. You get very bad headaches. You have had a blood clot. Where to find more information American Stroke Association: www.strokeassociation.org Get help right away if: You or a loved one has any symptoms of a stroke. "BE FAST" is an easy way to remember the main warning signs of a stroke: B - Balance. Signs are dizziness, sudden trouble walking, or loss of balance. E - Eyes. Signs are trouble seeing or a sudden change in vision. F - Face. Signs are sudden weakness or numbness of the face, or the face or eyelid drooping on one side. A - Arms. Signs are weakness or numbness in an arm. This happens suddenly and usually on one side of the body. S - Speech. Signs are sudden trouble speaking, slurred speech, or trouble understanding what people say. T - Time. Time to call emergency services. Write down what time symptoms started. You or a loved one has other signs of a stroke, such as: A sudden, severe headache with no known cause. Nausea or vomiting. Seizure. These symptoms may represent a serious problem that is an emergency. Do not wait to  see if the symptoms will go away. Get medical help right away. Call your local emergency services (911 in the [place] Do not drive yourself to the hospital. Summary You can help to prevent a stroke by eating healthy, exercising, not smoking, limiting alcohol intake, and managing any medical conditions you may have. Do not use any products that contain nicotine or tobacco. These include  cigarettes, chewing tobacco, and vaping devices, such as e-cigarettes. If you need help quitting, ask your health care provider. Remember "BE FAST" for warning signs of a stroke. Get help right away if you or a loved one has any of these signs. This information is not intended to replace advice given to you by your health care provider. Make sure you discuss any questions you have with your health care provider. Document Revised: 01/22/2020 Document Reviewed: 01/22/2020 [employer] Patient Education  [place].

## 2021-08-11 NOTE — Progress Notes (Signed)
[facility] [address] [place]. [place] [address] [phone]       STROKE FOLLOW UP NOTE  [name] Date of Birth:  February 05, 1951 Medical Record Number:  [id number]   Reason for Referral: neck pain and stroke follow up    CHIEF COMPLAINT:  Chief Complaint  Patient presents with   Consult    Room 20 w/ friend, Percell Miller. Hospital follow up. CVA on 07/28/21.       HPI: Update 08/11/2021 : Patient is seen today for follow-up upon her request accompanied by her friend.  She was admitted to [facility] on 07/29/2021 following 4 days of vomiting and diarrhea and dehydration.  She developed sudden onset of speech difficulties the and evaluation showed multiple small left frontoparietal as well as left occipital embolic infarcts.  CT angiogram showed 50% proximal left ICA stenosis however carotid ultrasound only showed 1-39% bilateral stenosis.  Echocardiogram showed normal ejection fraction without cardiac source of embolism.  Telemetry monitoring did not show any significant atrial fibrillation.  LDL cholesterol was marginally elevated at 81 mg percent.  Patient had been on aspirin and Plavix was added for 3 weeks.  She states she is doing well.  She is getting outpatient speech therapy presently.  She is still having some intermittent speech and word finding difficulties as well as some transient confusion and at times was noted trouble with writing checks she struggles with letters in spelling.  At times she has trouble cutting food holding a fork and knife.  She is concerned given trouble tying her shoelaces or feeling the gas tank.  She admits to having significant anxiety related to the problem she is having after the stroke.  She is on Lipitor 40 mg daily and tolerating it well but does have some muscle aches and pains.  She has never taken co-Q10.  She denies any episodes of numbness and paresthesias today. Last visit 03/18/2021, Jordan Martinez returns for 71-month follow-up unaccompanied.  She reports continued right facial numbness/tingling (more so jaw and side of neck) and posterior neck pain but overall improving currently working with PT.  Mainly symptoms only present in the evening.  She remains on amitriptyline 25 mg nightly and has not experienced any recent headaches.  Discussed use of extra 10 mg tablet as needed for breakthrough headaches as well as worsening nerve pain as needed but forget this was discussed and has not yet trialed extra dose as she has not had any additional headaches.  She does report approximately 2 weeks ago 2 separate episodes of transient confusion - 1 while trying to pump gas and forgot steps (lasted less than 1 min) and another when she mixed up checks to be mailed with her recycled papers (as both recycling and mailbox in the same vicinity where she resides) and ended up putting her checks in the recycling.  She shortly realized what she did and was able to recover her checks and mail them appropriately.  No other associated symptoms or neurological type symptoms.  She did experience similar symptoms after her prior stroke but no reoccurrence since then. No additional events since that time.  Reports compliance on aspirin and atorvastatin.  Blood pressure today 124/66. No further concerns at this time.     History provided for reference purposes only Update 01/14/2021 JM: Ms. YLukensreturns for sooner follow-up visit unaccompanied to discuss imaging and further treatment options after prior visit with Dr. SLeonie Man1 month ago for intermittent right  lower face and neck paresthesias that first presented around 10/2020. MR brain w/wo 12/29/2020 showed chronic left temporal and left insular ischemic infarcts but no evidence of acute findings. MR cervical 12/29/2020 degenerative changes with mild narrowing C4, C5 and C6 and severe left foraminal stenosis at C3-4 and severe bilateral foraminal stenosis C4-5.  She continues to experience symptoms  without new characteristics or features or worsening.  Typically worse in the evening associated with increase neck pain and stiffness.  Denies any radiating symptoms into her arms.  She has remained on amitriptyline 25 mg nightly for migraine prophylaxis with occasional 10 mg dose as needed.  Stable from stroke standpoint without new or reoccurring stroke/TIA symptoms.  Compliant on aspirin and atorvastatin without associated side effects.  Blood pressure today 134/84.  Loop recorder negative for atrial fibrillation thus far.  Update 12/18/2020 Dr. Leonie Man: Jordan Martinez is a pleasant 71 year old Caucasian lady seen today for office consultation visit right face and neck numbness.  History is obtained from the patient and review of referral notes and I personally reviewed electronic medical records in Care Everywhere and pertinent imaging films in PACS.  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  Patient has followed up for stroke follow-up in the clinic and was last seen by [facility] nurse practitioner on 10/01/2020.  Patient has a new complaint of tingling and numbness involving the outer aspect of the lower right face as well as neck.  This began about 2 months ago.  Initially this feeling would go down into the right arm as well as forearm.  This lasted only few days and since then she has been having intermittent tingling sensation on the outer aspect of the right lower face as well as the neck.  This may occur a couple of times a day.  Lasts only 1 to 2 minutes.  There are no specific triggers.  This is annoying but not very bothersome.  Occasionally she has discomfort and pain in the base of her neck.  She denies any true radicular pain or severe neck pain.  There is no  history of neck injury, motor vehicle accident.  She recently saw her orthopedic physician and [place] who did x-rays of the cervical spine which showed mild disc degenerative changes.  Patient also has a history of headaches following her stroke and was prescribed Elavil 25 mg at night which seems to help she still has headaches about couple of times a week but they are tolerable and not as bothersome.  She is currently on aspirin for stroke prevention and is having mild bruising but no major bleeding episodes.  He is tolerating Lipitor well with only occasional leg cramps.  Blood pressure is usually well controlled at home the today it is elevated in office at 159/85.  She plans to see a primary care physician later this month and have follow-up lipid profile and hemoglobin A1c checked.  She is extremely anxious that she may have cancer and worries a lot about this.  She has a daughter in [place] who is undergoing surgery this week and patient had to cancel her trip and she wants to finish her work-up prior to going there.  Update 10/01/2020 JM: Jordan Martinez returns for stroke and headache 65-monthfollow-up accompanied by her friend,  Edward  Stable from a stroke standpoint without new or recurring stroke/TIA symptoms Reports compliance on aspirin and atorvastatin -denies associated side effects Blood pressure today 135/83 Loop recorder has not shown atrial fibrillation thus far  Headaches have been stable only experiencing one migraine since prior visit. She took an additional amitriptyline 25 mg tablet with resolution.  She will experience very mild headaches approximately 2-3 times a week typically after dinner prior to schedule amitriptyline dosage but will quickly resolve after taking amitriptyline.  Percell Miller reports some difficulty with memory and anxiety She will have difficulty remembering numbers such as her address or cell phone password -this is been consistent since her stroke She feels as  though her anxiety is greatly improving where she is not consistently fearful of having additional strokes and is sleeping better at night  She is being followed by pain clinic for b/l knee pain recently receiving COOLIEF therapy by Dr. Franchot Mimes but denies much benefit.  She has scheduled follow-up visit with him tomorrow for further discussion  No further concerns at this time  Update 04/01/2020 JM patient returns for stroke f/u. Stable from stroke standpoint since prior visit without residual deficits. Denies new stroke/TIA symptoms. Chronic headaches stable on amitriptyline 81m nightly but does report morning grogginess.  Remains on aspirin 829mdaily and atorvastatin without side effects. Blood pressure today 138/78. Loop recorder has not shown atrial fibrillation thus far.  She continues to be followed by pain management for bilateral knee osteoarthritis.  Does report occasional anxiety due to medical conditions but overall stable.  No further concerns at this time.  Update 11/29/2019 JM: Ms. YiManseaus being seen for follow-up regarding left MCA stroke in 07/2018.  She has been doing well from a stroke standpoint without new or reoccurring stroke/TIA symptoms.  Underlying chronic headaches worsened post stroke and initiated amitriptyline with eventual increase to [id number] great benefit. Reports mild side effects such as mild fatigue the following day and mild skin photosensitivity. She does endorse improvement of anxiety as well with use of amitriptyline. Continues on aspirin and atorvastatin for secondary stroke prevention.  Blood pressure today 118/62.  Loop recorder has not shown atrial fibrillation thus far.  No further concerns at this time.  Initial visit 08/23/2019 JM: Jordan Martinez a 68 45ar old female who is being seen today for hospital follow up.  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  Prior history of  frontal headaches worsened with eyestrain and was told due to prior chemo treatments.  Headache intensity worsened post stroke gradually returning to baseline but continues to experience frequently.  At times she also experienced cloudiness but this has been unchanged.  She will be seeing her ophthalmologist next week for 1 year follow-up.  Describes headaches as a pressure sensation in frontal region and behind eyes bilaterally associated with photophobia and phonophobia but denies nausea/vomiting.  She will occasionally experience small flicker of light in her periphery prior to headache onset and is unsure if headaches are worse when she experiences visual aura.  She has not previously been treated for headaches or been diagnosed with migraine headaches.  She also endorses increased anxiety due to fear of recurrent stroke.  She has completed 3 weeks DAPT and continues on aspirin alone without bleeding or bruising.  Continues on atorvastatin 40 mg daily without myalgias.  Blood pressure today satisfactory at 130/70.  She has not had follow-up with IR as  recommended post revascularization during admission.  Loop recorder is not shown atrial fibrillation thus far.  No further concerns at this time.  Hospital admission 07/17/2019: Jordan Martinez is a 71 y.o. female with history of diabetes, endometrial cancer, HNPCC, hyperlipidemia, hypertension who presented on 07/17/2019 with aphasia.  Evaluated by stroke team and  Dr. Erlinda Hong with stroke work-up revealing left MCA infarct due to left M2 occlusion s/p TPA and IR with TICI 3 revascularization of distal left M2 with resultant small SAH left sylvian fissure, infarct embolic secondary to unknown source.  2D echo unremarkable.  LE venous Doppler negative.  Loop recorder placed to rule out A. fib as potential stroke etiology.  Recommended DAPT for 3 weeks and aspirin alone.  Initiated atorvastatin 40 mg daily with LDL 120.  HTN stable.  Controlled DM with A1c 7.0.  Current  tobacco use with smoking cessation counseling provided.  Other stroke risk factors include advanced age, EtOH use and obesity but no prior history of stroke.  Discharged back to [facility] with recommendation of physical therapy.  Stroke: L MCA infarct due to left M2 occlusion s/p tPA and IR with resultant small SAH left sylvian fissure, infarct embolic secondary to unknown source Code Stroke CT head hyperdense L MCA. ASPECTS 10.    CTA head & neck L M2 MCA ELVO. L ICA 50% stenosis, mild L siphon plaque. Mild atherosclerosis throughout CT perfusion 7 mL core, 53 mL penumbra L MCA territory  Cerebral angio TICI3 revascularization distal L M2  F/u CT mild SAH L sylvian fissure. No infarct  MRI patchy small L MCA territory infarct (L insula, L frontal operculum, L temporal lobe). Small SAHD L sylvian fissure stable. MRA Unremarkable  2D Echo EF 65-70%. No source of embolus  LE venous doppler No DVT Loop recorder placed 1/13 to look for AF as source of stroke ([place]) LDL 120 HgbA1c 7.0 aspirin 81 mg daily prior to admission, will start aspirin 81 and plavix 75 at d/c. Continue DAPT x 3 weeks then aspirin alone.   Therapy recommendations: continue OP PT  Disposition:  return to [facility]    ROS:   14 system review of systems performed and negative with exception of those listed in HPI  PMH:  Past Medical History:  Diagnosis Date   Allergy    Anxiety    on meds   Arthritis    on PRN meds   Cataract    bilateral cat. ext. with lens implant- in [place]   Diabetes mellitus without complication ([place])    diet controlled. States always has borderline HgbA1C   Endometrial cancer ([place])    endometrial CA-2019   Family history of colon cancer    daughter 22 colon CA   Family history of genetic mutation for hereditary nonpolyposis colorectal cancer (HNPCC)    GERD (gastroesophageal reflux disease)    on meds   High cholesterol    on meds   History of kidney stones    years    Hypertension     on meds   Neuromuscular disorder ([place])    Neuropathy    Sensation of pressure in bladder area    Stroke ([place]) 07/17/2019   Uterine cramping     PSH:  Past Surgical History:  Procedure Laterality Date   burnt the nerves  Right 04/29/2018   in right knee    burnt the nerves  Left 04/22/2018   left knee nerve    COLONOSCOPY  12/2018   [place]- flat  polyp/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   cystoscopy kidney stone   1985   HYSTERECTOMY ABDOMINAL WITH SALPINGO-OOPHORECTOMY  2019   IR CT HEAD LTD  07/18/2019   IR IMAGING GUIDED PORT INSERTION  06/15/2018   IR PERCUTANEOUS ART THROMBECTOMY/INFUSION INTRACRANIAL INC DIAG ANGIO  07/18/2019   IR REMOVAL TUN ACCESS W/ PORT W/O FL MOD SED  12/01/2018   LOOP RECORDER INSERTION N/A 07/19/2019   Procedure: LOOP RECORDER INSERTION;  Surgeon: Constance Haw, MD;  Location: [facility];  Service: Cardiovascular;  Laterality: N/A;   POLYPECTOMY     RADIOLOGY WITH ANESTHESIA N/A 07/17/2019   Procedure: IR WITH ANESTHESIA;  Surgeon: Luanne Bras, MD;  Location: [facility];  Service: Radiology;  Laterality: N/A;   REPLACEMENT TOTAL KNEE BILATERAL Bilateral 2018   Right x 2. 2019 undergoing intermittent injection "RFA" procedures with Dr. Sheliah Mends [facility]   REVISION TOTAL KNEE ARTHROPLASTY Right 03/2017   ROBOTIC ASSISTED TOTAL HYSTERECTOMY WITH BILATERAL SALPINGO OOPHERECTOMY N/A 05/03/2018   Procedure: XI ROBOTIC ASSISTED TOTAL HYSTERECTOMY WITH BILATERAL SALPINGO OOPHORECTOMY;  Surgeon: Everitt Amber, MD;  Location: [facility];  Service: Gynecology;  Laterality: N/A;   SENTINEL NODE BIOPSY N/A 05/03/2018   Procedure: SENTINEL NODE BIOPSY;  Surgeon: Everitt Amber, MD;  Location: [facility];  Service: Gynecology;  Laterality: N/A;   TONSILLECTOMY AND ADENOIDECTOMY     TUBAL LIGATION      Social History:  Social History   Socioeconomic History   Marital status: Divorced    Spouse name: Not on file   Number of children: 2   Years of  education: Not on file   Highest education level: Not on file  Occupational History   Occupation: retired Marine scientist  Tobacco Use   Smoking status: Every Day    Packs/day: 0.25    Years: 40.00    Pack years: 10.00    Types: Cigarettes   Smokeless tobacco: Never   Tobacco comments:    4-5 loose cigarettes per day , over 20 year hx of smoking   Vaping Use   Vaping Use: Never used  Substance and Sexual Activity   Alcohol use: Yes    Alcohol/week: 14.0 standard drinks    Types: 14 Glasses of wine per week    Comment: about 2 glasses wine daily.    Drug use: Never   Sexual activity: Not Currently    Birth control/protection: None  Other Topics Concern   Not on file  Social History Narrative   Not on file   Social Determinants of Health   Financial Resource Strain: Not on file  Food Insecurity: Not on file  Transportation Needs: Not on file  Physical Activity: Not on file  Stress: Not on file  Social Connections: Not on file  Intimate Partner Violence: Not on file    Family History:  Family History  Adopted: Yes  Problem Relation Age of Onset   Colon cancer Daughter 61       Lynch syndrome - MSH6 +   Colon polyps Daughter 65   Healthy Son    Esophageal cancer Neg Hx    Rectal cancer Neg Hx    Stomach cancer Neg Hx     Medications:   Current Outpatient Medications on File Prior to Visit  Medication Sig Dispense Refill   acetaminophen (TYLENOL) 500 MG tablet Take 1,000 mg by mouth once as needed for mild pain or moderate pain. Extra strength     amitriptyline (ELAVIL) 10 MG tablet Take  1 tablet (10 mg total) by mouth at bedtime. 90 tablet 3   amitriptyline (ELAVIL) 25 MG tablet Take 1 tablet (25 mg total) by mouth at bedtime. 90 tablet 3   amoxicillin (AMOXIL) 500 MG capsule Take 2,000 mg by mouth once as needed (dental work).     aspirin EC 81 MG tablet Take 81 mg by mouth daily.     atenolol (TENORMIN) 50 MG tablet Take 75 mg by mouth daily.     Biotin 5000 MCG TABS  Take 5,000 mcg by mouth daily.     Cholecalciferol (VITAMIN D3) 50 MCG (2000 UT) TABS Take 1 tablet by mouth daily in the afternoon.     clopidogrel (PLAVIX) 75 MG tablet Take 1 tablet by mouth daily.     cyclobenzaprine (FLEXERIL) 5 MG tablet Take 5 mg by mouth 3 (three) times daily as needed for muscle spasms.     Lancets (ONETOUCH DELICA PLUS [id number]) MISC Apply topically daily.     lisinopril (ZESTRIL) 10 MG tablet Take 10 mg by mouth daily.     metFORMIN (GLUCOPHAGE) 500 MG tablet TAKE 1 TABLET BY MOUTH TWICE A DAY WITH MEALS 60 tablet 1   Omega-3 Fatty Acids (FISH OIL) 1200 MG CAPS Take 1,200 mg by mouth daily.      omeprazole (PRILOSEC) 40 MG capsule Take 1 capsule (40 mg total) by mouth daily. 90 capsule 3   ONETOUCH ULTRA test strip USE TO CHECK BLOOD SUGAR ONCE DAILY E11.69     Propylene Glycol 0.6 % SOLN Place 2 drops into both eyes 4 (four) times daily.     No current facility-administered medications on file prior to visit.    Allergies:   Allergies  Allergen Reactions   Amlodipine     Blurred vision    Compazine [Prochlorperazine Edisylate]     Stroke symptoms,   Gabapentin     Blurred vision and nystagmus   Lyrica [Pregabalin]     Blurred Vision    Sertraline Other (See Comments)    Visual disturbances.    Chlorhexidine Rash    Blisters and redness to chest     Physical Exam  Vitals:   08/11/21 1539  BP: 138/84  Pulse: 80  Weight: 183 lb 9.6 oz (83.3 kg)  Height: 5' 4" (1.626 m)    Body mass index is 31.51 kg/m. No results found.  General: well developed, well nourished, pleasant middle-age Caucasian female, seated, in no evident distress Head: head normocephalic and atraumatic.   Neck: supple with no carotid or supraclavicular bruits Cardiovascular: regular rate and rhythm, no murmurs Musculoskeletal: full neck ROM slight increase stiffness when looking towards the right but improved compared to prior visit Skin:  no rash/but scattered petechiae  bilateral forearms Vascular:  Normal pulses all extremities   Neurologic Exam Mental Status: Awake and fully alert. Fluent speech and language. Oriented to place and time. Recent  and remote memory intact. Attention span, concentration and fund of knowledge appropriate during visit. Mood and affect appropriate.  Slightly hesitant speech with occasional word finding difficulties.  No paraphasic errors.  Good comprehension and repetition.  Able to name 13 animals which can walk on 4 legs. Cranial Nerves: Pupils equal, briskly reactive to light. Extraocular movements full without nystagmus. Visual fields full to confrontation. Hearing intact. Facial sensation intact. Face, tongue, palate moves normally and symmetrically.  Motor: Normal bulk and tone. Normal strength in all tested extremity muscles. Sensory.: intact to touch , pinprick , position and vibratory  sensation.  Coordination: Rapid alternating movements normal in all extremities. Finger-to-nose and heel-to-shin performed accurately bilaterally. Gait and Station: Arises from chair without difficulty. Stance is normal. Gait demonstrates normal stride length and balance without use of assistive device Reflexes: 1+ and symmetric. Toes downgoing.        ASSESSMENT: Jordan Martinez is a 71 y.o. 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 Loop recorder placed.  Vascular risk factors include history of endometrial cancer with completion of chemo 2019, HTN, HLD, DM, L ICA 50% stenosis.  Recurrent to embolic small left MCA and PCA infarcts in January 2023 with residual speech difficulties and mild cognitive impairment.     PLAN: I had a long d/w patient and her friend about her recent recurrent cryptogenic strokes and mild residual speech and cognitive difficulties, risk for recurrent stroke/TIAs, personally  independently reviewed imaging studies and stroke evaluation results and answered questions.Continue aspirin 81 mg daily and clopidogrel 75 mg daily  for 1 more week and then stop aspirin and stay on clopidogrel alone secondary stroke prevention and maintain strict control of hypertension with blood pressure goal below 130/90, diabetes with hemoglobin A1c goal below 6.5% and lipids with LDL cholesterol goal below 70 mg/dL. I also advised the patient to eat a healthy diet with plenty of whole grains, cereals, fruits and vegetables, exercise regularly and maintain ideal body weight .add coenzyme every 10 200 mg daily for statin myalgias.  Increase dose of Lipitor to 80 mg daily.  If she is unable to tolerate it may consider changing to PCSK9 inhibitor injections later.  Continue ongoing speech therapy.  Followup in the future with my nurse practitioner Janett Billow in 3 months or call earlier if necessary.  I spent 35  minutes of face-to-face and non-face-to-face time with patient.  This included previsit chart review, lab review, study review, order entry, electronic health record documentation, patient education and discussion regarding continued right facial and neck paresthesias, recent transient confusion events and possible etiologies, history of cryptogenic stroke as well as secondary stroke prevention measures and aggressive stroke risk factor management, review of loop recorder, chronic migraines and ongoing management and answered all other questions to patients satisfaction   Antony Contras, [facility] [address] [place], [place] [address]  Phone [phone] Fax [phone] Note: This document was prepared with digital dictation and possible smart phrase technology. Any transcriptional errors that result from this process are unintentional.

## 2021-09-02 ENCOUNTER — Ambulatory Visit (INDEPENDENT_AMBULATORY_CARE_PROVIDER_SITE_OTHER): Payer: Medicare Other

## 2021-09-02 DIAGNOSIS — I63412 Cerebral infarction due to embolism of left middle cerebral artery: Secondary | ICD-10-CM | POA: Diagnosis not present

## 2021-09-02 LAB — CUP PACEART REMOTE DEVICE CHECK
Date Time Interrogation Session: 20230227230806
Implantable Pulse Generator Implant Date: 20210113

## 2021-09-10 ENCOUNTER — Ambulatory Visit: Payer: Medicare Other | Admitting: Neurology

## 2021-09-10 NOTE — Progress Notes (Signed)
Carelink Summary Report / Loop Recorder 

## 2021-09-17 ENCOUNTER — Encounter: Payer: Self-pay | Admitting: Gynecologic Oncology

## 2021-09-22 ENCOUNTER — Encounter: Payer: Self-pay | Admitting: Gynecologic Oncology

## 2021-09-22 ENCOUNTER — Other Ambulatory Visit: Payer: Self-pay

## 2021-09-22 ENCOUNTER — Inpatient Hospital Stay: Payer: Medicare Other | Attending: Gynecologic Oncology | Admitting: Gynecologic Oncology

## 2021-09-22 VITALS — BP 133/72 | HR 96 | Temp 99.0°F | Resp 16 | Ht 64.17 in | Wt 183.0 lb

## 2021-09-22 DIAGNOSIS — Z1509 Genetic susceptibility to other malignant neoplasm: Secondary | ICD-10-CM | POA: Diagnosis not present

## 2021-09-22 DIAGNOSIS — Z90710 Acquired absence of both cervix and uterus: Secondary | ICD-10-CM | POA: Insufficient documentation

## 2021-09-22 DIAGNOSIS — Z1501 Genetic susceptibility to malignant neoplasm of breast: Secondary | ICD-10-CM | POA: Diagnosis not present

## 2021-09-22 DIAGNOSIS — C541 Malignant neoplasm of endometrium: Secondary | ICD-10-CM

## 2021-09-22 DIAGNOSIS — Z923 Personal history of irradiation: Secondary | ICD-10-CM | POA: Diagnosis not present

## 2021-09-22 DIAGNOSIS — Z90722 Acquired absence of ovaries, bilateral: Secondary | ICD-10-CM | POA: Diagnosis not present

## 2021-09-22 DIAGNOSIS — Z9221 Personal history of antineoplastic chemotherapy: Secondary | ICD-10-CM | POA: Diagnosis not present

## 2021-09-22 DIAGNOSIS — Z8542 Personal history of malignant neoplasm of other parts of uterus: Secondary | ICD-10-CM | POA: Diagnosis present

## 2021-09-22 DIAGNOSIS — Z1502 Genetic susceptibility to malignant neoplasm of ovary: Secondary | ICD-10-CM | POA: Diagnosis not present

## 2021-09-22 NOTE — Patient Instructions (Signed)
It was a pleasure meeting you today.  I do not see or feel any evidence of cancer recurrence on your exam. ? ?I will see you for follow-up in 6 months.  As my schedule is not out past the end of June, please call back sometime in July or August to get a visit scheduled with me for late September. ? ?As always, please call if you develop any concerning symptoms before your next visit. ?

## 2021-09-22 NOTE — Progress Notes (Signed)
Gynecologic Oncology Return Clinic Visit ? ?09/22/2021 ? ?Reason for Visit: Surveillance visit in the setting of history of endometrial cancer ? 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  ?Endometrial cancer ([place])  ?04/21/2018 Imaging  ? 1. Endometrium, biopsy ?- HIGH GRADE CARCINOMA, SEE COMMENT. ?2. Cervix, biopsy ?- UNREMARKABLE ENDOCERVICAL GLANDULAR AND SQUAMOUS MUCOSA. ?- MULTIPLE DETACHED FRAGMENTS OF HIGH GRADE CARCINOMA, MORPHOLOGICALLY SIMILAR TO SPECIMEN 1. ?Microscopic Comment ?1. 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 ?  ?04/21/2018 Imaging  ? US pelvis ?Abnormally thickened endometrium measuring up to 16 mm. In the setting of post-menopausal bleeding, endometrial sampling is indicated to exclude carcinoma. If results are benign, sonohysterogram should be considered for focal lesion work-up.  ?  ?04/27/2018 Imaging  ? Ct abdomen and pelvis ?1. Thickening of the endometrium identified compatible with endometrial carcinoma. ?2. No findings to suggest metastatic adenopathy or distant metastatic disease. ?3. Hiatal hernia ?4.  Aortic Atherosclerosis (ICD10-I70.0). ?  ?  ?05/02/2018 Tumor Marker  ? Patient's tumor was tested for the following markers: CA-125 ?Results of the tumor marker test revealed 65.3 ?  ?05/03/2018 Pathology Results  ? 1. Lymph node, sentinel, biopsy, right obturator ?- ONE OF ONE LYMPH NODES NEGATIVE FOR CARCINOMA (0/1). ?2. Lymph node, sentinel, biopsy, left external iliac ?- FOUR OF FOUR LYMPH NODES NEGATIVE FOR CARCINOMA (0/4). ?3. Uterus +/- tubes/ovaries, neoplastic, cervix, bilateral tubes and ovaries ?- UTERUS: ?-ENDO/MYOMETRIUM: INVASIVE ENDOMETRIOID ADENOCARCINOMA WITH SQUAMOUS ?DIFFERENTIATION, FIGO GRADE 1, SPANNING 2.2  CM. ?TUMOR INVADES LESS THAN ONE HALF OF THE MYOMETRIUM. ?SEE ONCOLOGY TABLE. ?-SEROSA: UNREMARKABLE. NO MALIGNANCY. ?- CERVIX: BENIGN SQUAMOUS AND ENDOCERVICAL MUCOSA. NO DYSPLASIA OR MALIGNANCY. ?- BILATERAL OVARIES: INCLUSION CYSTS. NO MALIGNANCY. ?- BILATERAL FALLOPIAN TUBES: UNREMARKABLE. NO MALIGNANCY. ?Microscopic Comment ?3. UTERUS, CARCINOMA OR CARCINOSARCOMA ?Procedure: Total hysterectomy with bilateral salpingo-oophorectomy. Right obturator and left external iliac lymph ?node biopsies. ?Histologic type: Endometrioid adenocarcinoma with squamous differentiation. ?Histologic Grade: FIGO grade I. ?Myometrial invasion: ?Depth of invasion: 3 mm ?Myometrial thickness: 12 mm ?Uterine Serosa Involvement: Not identified. ?Cervical stromal involvement: Not identified. ?Extent of involvement of other organs: Uninvolved. ?Lymphovascular invasion: Not identified. ?Regional Lymph Nodes: ?Examined: 5 Sentinel ?0 Non-sentinel ?5 Total ?Lymph nodes with metastasis: 0 ?Isolated tumor cells (< 0.2 mm): 0 ?Micrometastasis: (> 0.2 mm and < 2.0 mm): 0 ?Macrometastasis: (> 2.0 mm): 0 ?Extracapsular extension: N/A. ?MMR / MSI testing: Will be ordered. ?Pathologic Stage Classification (pTNM, AJCC 8th edition): pT1a, pN0 ?FIGO Stage: IA ?Representative Tumor Block: 3D-G ?Comment: Pancytokeratin was performed on the lymph nodes and is negative ?  ?05/03/2018 Surgery  ? Operation: Robotic-assisted laparoscopic total hysterectomy with bilateral salpingoophorectomy, SLN biopsy  ?  ?Surgeon: Donaciano Eva ?   ?Operative Findings:  : 6cm normal appearing uterus. Normal tubes and ovaries. Normal omentum and diaphragm. No suspicious nodes.  ?  ?  ?05/03/2018 Pathology Results  ? PERITONEAL WASHING(SPECIMEN 1 OF 1 COLLECTED 05/03/18): ?MALIGNANT CELLS CONSISTENT WITH METASTATIC ADENOCARCINOMA. ?  ?05/18/2018 Tumor Marker  ? Patient's tumor was tested for the following markers: CA-125 ?Results of the tumor marker test revealed 77.5 ?   ?05/27/2018 Genetic Testing  ? MSH6 c.2731C>T pathogenic variant and GATA2 c.1232C>T VUS identified on the multicancer panel.  The Multi-Gene Panel offered by [facility] includes sequencing and/or deletion duplication testing of the following 85 genes:  AIP, ALK, APC, ATM, AXIN2,BAP1,  BARD1, BLM, BMPR1A, BRCA1, BRCA2, BRIP1, CASR, CDC73, CDH1, CDK4, CDKN1B, CDKN1C, CDKN2A (p14ARF), CDKN2A (p16INK4a), CEBPA, CHEK2, CTNNA1, DICER1, DIS3L2, EGFR (c.2369C>T, p.Thr790Met variant only), EPCAM (Deletion/duplication testing only), FH, FLCN, GATA2, GPC3, GREM1 (Promoter region deletion/duplication testing only), HOXB13 (c.251G>A, p.Gly84Glu), HRAS, KIT, MAX, MEN1, MET, MITF (c.952G>A, p.Glu318Lys variant only), MLH1, MSH2, MSH3, MSH6, MUTYH, NBN, NF1, NF2, NTHL1, PALB2, PDGFRA, PHOX2B, PMS2, POLD1, POLE, POT1, PRKAR1A, PTCH1, PTEN, RAD50, RAD51C, RAD51D, RB1, RECQL4, RET, RNF43, RUNX1, SDHAF2, SDHA (sequence changes only), SDHB, SDHC, SDHD, SMAD4, SMARCA4, SMARCB1, SMARCE1, STK11, SUFU, TERC, TERT, TMEM127, TP53, TSC1, TSC2, VHL, WRN and WT1.  The report date is 05/27/2018. ?  ?06/09/2018 Cancer Staging  ? Staging form: Corpus Uteri - Carcinoma and Carcinosarcoma, AJCC 8th Edition ?- Pathologic: Stage I (pT1, pN0, cM0) - Signed by Heath Lark, MD on 06/09/2018 ? ?  ?06/15/2018 Procedure  ? Placement of single lumen port a cath via right internal jugular vein. The catheter tip lies at the cavo-atrial junction. A power injectable port a cath was placed and is ready for immediate use. ?  ?  ?06/20/2018 - 10/26/2018 Chemotherapy  ? The patient had carboplatin and Taxol x 2 cycles, Cycle 3 Taxol at 50%, then cycle 4-6 without Taxol, carboplatin only ?  ?06/25/2018 - 06/27/2018 Hospital Admission  ? She was admitted to the hospital with deydration ?  ?11/23/2018 Imaging  ? 1. 8 mm hyperattenuating nodule along the midline vaginal cuff. Coronal imaging suggests that the hyperattenuation is related to ?calcification, but enhancement in  this nodule cannot be excluded. Patient is status post hysterectomy in the interval since prior CT. While this finding may be related to prior surgery, close follow-up recommended. ?2. Otherwise no evidence for metastatic disease in the abdomen or pelvis. ?3. Large hiatal hernia with more than 50% of the stomach herniated into the lower chest, stable. ?4.  Aortic Atherosclerois (ICD10-170.0) ?  ?12/01/2018 Procedure  ? Successful removal of implanted Port-A-Cath ?  ? ?She noted 3 weeks of postmenopausal bleeding.  On 03/30/2018 she reports this was quite heavy with cramps.  She is new to the [place] living in a retirement [place] and she was unable to find a provider in a timely manner.  She was referred by the retirement center to an urgent care center in [place] who then triaged her to see Dr. Ihor Dow.  Given the patient's symptoms an endometrial biopsy was performed along with a pelvic ultrasound.  Documented exam by Dr. Ihor Dow states that there was a lesion at 6:00 on the cervix which was biopsied.  In addition to the endometrial biopsy being performed.  The endometrial biopsy revealed high-grade carcinoma with a comment stating the majority appears to be FIGO grade 3 endometrioid with scattered foci suggestive of serous phenotype.  The cervix revealed unremarkable endocervical glandular and squamous mucosa there were detached fragments of high-grade carcinoma similar to the endometrial biopsy tissue.  Ultrasound revealed a uterus 6.6 x 3 x 3.7 cm with no fibroids endometrium 1.6 cm right ovary normal left ovary not visualized. ?  ?The patient is retired Writer.  She states her last Pap smear was in March 2019 in [place].  She states she got a phone call from that office that her Pap smear was normal. ?  ?A CT scan was performed preop and noted below.  In summary no evidence of extrauterine disease. ?  ?The patient underwent surgical staging with a robotic assisted  total hysterectomy BSO sentinel lymph node biopsy on May 03, 2018.  Due to her symptoms of abdominal bloating, elevated Ca1 25, and her daughter's history of Lynch syndrome her tinea washings were obtained.

## 2021-10-06 ENCOUNTER — Ambulatory Visit (INDEPENDENT_AMBULATORY_CARE_PROVIDER_SITE_OTHER): Payer: Medicare Other

## 2021-10-06 DIAGNOSIS — I63412 Cerebral infarction due to embolism of left middle cerebral artery: Secondary | ICD-10-CM | POA: Diagnosis not present

## 2021-10-07 LAB — CUP PACEART REMOTE DEVICE CHECK
Date Time Interrogation Session: 20230401230517
Implantable Pulse Generator Implant Date: 20210113

## 2021-10-21 NOTE — Progress Notes (Signed)
Carelink Summary Report / Loop Recorder 

## 2021-11-10 ENCOUNTER — Ambulatory Visit (INDEPENDENT_AMBULATORY_CARE_PROVIDER_SITE_OTHER): Payer: Medicare Other

## 2021-11-10 DIAGNOSIS — I63412 Cerebral infarction due to embolism of left middle cerebral artery: Secondary | ICD-10-CM | POA: Diagnosis not present

## 2021-11-10 LAB — CUP PACEART REMOTE DEVICE CHECK
Date Time Interrogation Session: 20230507231555
Implantable Pulse Generator Implant Date: 20210113

## 2021-11-13 ENCOUNTER — Ambulatory Visit (INDEPENDENT_AMBULATORY_CARE_PROVIDER_SITE_OTHER): Payer: Medicare Other | Admitting: Adult Health

## 2021-11-13 ENCOUNTER — Encounter: Payer: Self-pay | Admitting: Adult Health

## 2021-11-13 VITALS — BP 134/70 | HR 74 | Ht 64.0 in | Wt 186.8 lb

## 2021-11-13 DIAGNOSIS — R202 Paresthesia of skin: Secondary | ICD-10-CM

## 2021-11-13 DIAGNOSIS — G43709 Chronic migraine without aura, not intractable, without status migrainosus: Secondary | ICD-10-CM | POA: Diagnosis not present

## 2021-11-13 DIAGNOSIS — I639 Cerebral infarction, unspecified: Secondary | ICD-10-CM

## 2021-11-13 DIAGNOSIS — I69328 Other speech and language deficits following cerebral infarction: Secondary | ICD-10-CM

## 2021-11-13 DIAGNOSIS — I69398 Other sequelae of cerebral infarction: Secondary | ICD-10-CM

## 2021-11-13 DIAGNOSIS — H539 Unspecified visual disturbance: Secondary | ICD-10-CM

## 2021-11-13 NOTE — Progress Notes (Signed)
?[facility] ?[address] ?[place]. [place] [address] ?[phone] ? ?     STROKE FOLLOW UP NOTE ? ?Jordan Martinez ?Date of Birth:  09-10-1950 ?Medical Record Number:  [id number]  ? ?Reason for Referral: Stroke follow-up ? ? ? ?CHIEF COMPLAINT:  ?Chief Complaint  ?Patient presents with  ? Cryptogenic stroke  ?  Rm 3, 6 month FU  friend/POA- Ed  "TIA in Jan 2023, had 8 wks ST- I still make mistakes sometimes but am aware right away, how long before I get back to normal, I get eye strain"  ? ? ? ? ? ?HPI: ? ?Update 11/13/2021 JM: Patient returns for 66-monthfollow-up after prior visit with Dr. SLeonie Manfor hospital follow-up.  She is accompanied by her friend, Ed.  She has been doing well since that time without any new stroke/TIA symptoms.  She has made great recovery since prior visit with improvement of speech and only occasional difficulty seeing correct words and occasional stuttering with increased anxiety.  She continues to have some difficulty with numbers and letters but this is also greatly improved.  She completed 8 weeks of SLP.  She routinely does sudoku and word search.  She does note continued anxiety but this has been gradually improving.  She plans on further discussing with PCP at follow-up visit next month.  She also mentions eyestrain since her stroke but denies any visual loss or double vision.  She was scheduled to see her ophthalmologist for yearly follow-up but needed to reschedule and unable to be seen until September.  Compliant on Plavix and atorvastatin, denies side effects.  Blood pressure today 130/70.  She remains on amitriptyline '10mg'$  at dinner and '25mg'$  nightly - neck numbness/paresthesias stable and occasional migraine headaches but overall well controlled.  No further concerns at this time. ? ? ? ?History provided for reference purposes only ?Update 08/11/2021 Dr. SLeonie ManPatient is seen today for follow-up upon her request accompanied by her friend.  [facility] 4 days of vomiting and diarrhea and dehydration.  She developed sudden onset of speech difficulties the and evaluation showed multiple small left frontoparietal as well as left occipital embolic infarcts.  CT angiogram showed 50% proximal left ICA stenosis however carotid ultrasound only showed 1-39% bilateral stenosis.  Echocardiogram showed normal ejection fraction without cardiac source of embolism.  Telemetry monitoring did not show any significant atrial fibrillation.  LDL cholesterol was marginally elevated at 81 mg percent.  Patient had been on aspirin and Plavix was added for 3 weeks.  She states she is doing well.  She is getting outpatient speech therapy presently.  She is still having some intermittent speech and word finding difficulties as well as some transient confusion and at times was noted trouble with writing checks she struggles with letters in spelling.  At times she has trouble cutting food holding a fork and knife.  She is concerned given trouble tying her shoelaces or feeling the gas tank.  She admits to having significant anxiety related to the problem she is having after the stroke.  She is on Lipitor 40 mg daily and tolerating it well but does have some muscle aches and pains.  She has never taken co-Q10.  She denies any episodes of numbness and paresthesias today. ? ? ?Update 06/16/2021 JM:  ?Patient returns for follow-up unaccompanied. Right facial numbness and tingling and posterior neck pain significantly improved. Has finished physical therapy for her neck and  continues exercises at home. No additional headaches. No additional transient confusion episodes - MR brain and EEG unremarkable. Denies new stroke/TIA symptoms. Remains on Atorvastatin and Aspirin without side effects and Amitriptyline 35 mg nightly for headaches and facial pain. BP today 127/81. Pt reports A1C of 6.4, unable to view in [facility] next labs in January.  Loop recorder has  not shown atrial fibrillation thus far.  She plans on traveling to [place] soon to visit family (she has not been able to over the past 3 years due to medical condition).  No further concerns at this time. ? ?Update 03/18/2021 JM: Jordan Martinez returns for 39-monthfollow-up unaccompanied.  She reports continued right facial numbness/tingling (more so jaw and side of neck) and posterior neck pain but overall improving currently working with PT.  Mainly symptoms only present in the evening.  She remains on amitriptyline 25 mg nightly and has not experienced any recent headaches.  Discussed use of extra 10 mg tablet as needed for breakthrough headaches as well as worsening nerve pain as needed but forget this was discussed and has not yet trialed extra dose as she has not had any additional headaches.  She does report approximately 2 weeks ago 2 separate episodes of transient confusion - 1 while trying to pump gas and forgot steps (lasted less than 1 min) and another when she mixed up checks to be mailed with her recycled papers (as both recycling and mailbox in the same vicinity where she resides) and ended up putting her checks in the recycling.  She shortly realized what she did and was able to recover her checks and mail them appropriately.  No other associated symptoms or neurological type symptoms.  She did experience similar symptoms after her prior stroke but no reoccurrence since then. No additional events since that time.  Reports compliance on aspirin and atorvastatin.  Blood pressure today 124/66. No further concerns at this time.  ? ?Update 01/14/2021 JM: Jordan Martinez for sooner follow-up visit unaccompanied to discuss imaging and further treatment options after prior visit with Dr. SLeonie Man1 month ago for intermittent right lower face and neck paresthesias that first presented around 10/2020. MR brain w/wo 12/29/2020 showed chronic left temporal and left insular ischemic infarcts but no evidence of acute  findings. MR cervical 12/29/2020 degenerative changes with mild narrowing C4, C5 and C6 and severe left foraminal stenosis at C3-4 and severe bilateral foraminal stenosis C4-5.  She continues to experience symptoms without new characteristics or features or worsening.  Typically worse in the evening associated with increase neck pain and stiffness.  Denies any radiating symptoms into her arms.  She has remained on amitriptyline 25 mg nightly for migraine prophylaxis with occasional 10 mg dose as needed. ? ?Stable from stroke standpoint without new or reoccurring stroke/TIA symptoms.  Compliant on aspirin and atorvastatin without associated side effects.  Blood pressure today 134/84.  Loop recorder negative for atrial fibrillation thus far. ? ?Update 12/18/2020 Dr. SLeonie Man Jordan Martinez a pleasant 71year old Caucasian lady seen today for office consultation visit right face and neck numbness.  History is obtained from the patient and review of referral notes and I personally reviewed electronic medical records in Care Everywhere and pertinent imaging films in PACS.  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  Patient has followed up for stroke follow-up in the clinic and was last seen by [facility] nurse practitioner on 10/01/2020.  Patient has a new complaint of tingling and numbness involving the outer aspect of the lower right face as well as neck.  This began about 2 months ago.  Initially this feeling would go down into the right arm as well as forearm.  This lasted only few days and since then she has been having intermittent tingling sensation on the outer aspect of the right lower face as well as the neck.  This may occur a couple of times a day.  Lasts only 1 to 2 minutes.   There are no specific triggers.  This is annoying but not very bothersome.  Occasionally she has discomfort and pain in the base of her neck.  She denies any true radicular pain or severe neck pain.  There i

## 2021-11-13 NOTE — Patient Instructions (Addendum)
Continue clopidogrel 75 mg daily  and atorvastatin for secondary stroke prevention ? ?Continue amitriptyline 35 mg total daily  ? ?You will be called by Dr. Katy Fitch office for further evaluation of visual concerns after your stroke  ? ?Continue to follow up with PCP regarding cholesterol, blood pressure and diabetes management  ?Maintain strict control of hypertension with blood pressure goal below 130/90, diabetes with hemoglobin A1c goal below 7.0 % and cholesterol with LDL cholesterol (bad cholesterol) goal below 70 mg/dL.  ? ?Signs of a Stroke? Follow the BEFAST method:  ?Balance Watch for a sudden loss of balance, trouble with coordination or vertigo ?Eyes Is there a sudden loss of vision in one or both eyes? Or double vision?  ?Face: Ask the person to smile. Does one side of the face droop or is it numb?  ?Arms: Ask the person to raise both arms. Does one arm drift downward? Is there weakness or numbness of a leg? ?Speech: Ask the person to repeat a simple phrase. Does the speech sound slurred/strange? Is the person confused ? ?Time: If you observe any of these signs, call 911. ? ? ? ? ? ?Followup in the future with me in 6 months or call earlier if needed ? ? ? ? ? ? ?Thank you for coming to see [place] at [facility]. I hope we have been able to provide you high quality care today. ? ?You may receive a patient satisfaction survey over the next few weeks. We would appreciate your feedback and comments so that we may continue to improve ourselves and the health of our patients. ? ?

## 2021-11-17 ENCOUNTER — Telehealth: Payer: Self-pay | Admitting: Adult Health

## 2021-11-17 NOTE — Telephone Encounter (Signed)
Sent to Dr. Groat ph # [phone] 

## 2021-11-25 NOTE — Progress Notes (Signed)
Carelink Summary Report / Loop Recorder 

## 2021-11-27 ENCOUNTER — Telehealth: Payer: Self-pay | Admitting: Cardiology

## 2021-11-27 NOTE — Telephone Encounter (Signed)
Spoke with patient informed her that her monitor sends information nightly and will send any information that has been stored from the previous day at 0600 before her MRI so will not lose any data patient voiced understanding

## 2021-11-27 NOTE — Telephone Encounter (Signed)
Pt states that her Orthopedic doctor wants her to have an MRI done. Pt states that they told her that it may be a chance that her device loses information. Pt would like a callback regarding this matter. Please advise

## 2021-12-15 ENCOUNTER — Ambulatory Visit (INDEPENDENT_AMBULATORY_CARE_PROVIDER_SITE_OTHER): Payer: Medicare Other

## 2021-12-15 DIAGNOSIS — I63412 Cerebral infarction due to embolism of left middle cerebral artery: Secondary | ICD-10-CM | POA: Diagnosis not present

## 2021-12-16 LAB — HM DIABETES EYE EXAM

## 2021-12-17 LAB — CUP PACEART REMOTE DEVICE CHECK
Date Time Interrogation Session: 20230609230452
Implantable Pulse Generator Implant Date: 20210113

## 2021-12-22 ENCOUNTER — Ambulatory Visit: Payer: Medicare Other | Admitting: Adult Health

## 2022-01-01 ENCOUNTER — Encounter: Payer: Self-pay | Admitting: Gastroenterology

## 2022-01-15 LAB — CUP PACEART REMOTE DEVICE CHECK
Date Time Interrogation Session: 20230712231339
Implantable Pulse Generator Implant Date: 20210113

## 2022-01-19 ENCOUNTER — Ambulatory Visit (INDEPENDENT_AMBULATORY_CARE_PROVIDER_SITE_OTHER): Payer: Medicare Other

## 2022-01-19 DIAGNOSIS — I63412 Cerebral infarction due to embolism of left middle cerebral artery: Secondary | ICD-10-CM

## 2022-01-21 ENCOUNTER — Encounter: Payer: Self-pay | Admitting: Neurology

## 2022-01-22 ENCOUNTER — Telehealth: Payer: Self-pay | Admitting: *Deleted

## 2022-01-22 NOTE — Telephone Encounter (Signed)
Patient called and scheduled a follow up appt with Dr Berline Lopes for September

## 2022-01-26 MED ORDER — ROSUVASTATIN CALCIUM 40 MG PO TABS: 40.0000 mg | ORAL_TABLET | Freq: Every day | ORAL | 5 refills | Status: DC

## 2022-01-26 NOTE — Telephone Encounter (Signed)
Can we get these results from PCP?

## 2022-01-27 NOTE — Telephone Encounter (Signed)
Can we fax this to PCP office? I am not able to send via epic. Thank you!

## 2022-02-04 ENCOUNTER — Telehealth: Payer: Self-pay | Admitting: *Deleted

## 2022-02-04 NOTE — Telephone Encounter (Signed)
Rescheduled the patient's appt from 9/18 to 9/1

## 2022-02-16 NOTE — Progress Notes (Signed)
Carelink Summary Report / Loop Recorder 

## 2022-02-23 ENCOUNTER — Ambulatory Visit (INDEPENDENT_AMBULATORY_CARE_PROVIDER_SITE_OTHER): Payer: Medicare Other

## 2022-02-23 DIAGNOSIS — I63412 Cerebral infarction due to embolism of left middle cerebral artery: Secondary | ICD-10-CM

## 2022-02-25 LAB — CUP PACEART REMOTE DEVICE CHECK
Date Time Interrogation Session: 20230814230902
Implantable Pulse Generator Implant Date: 20210113

## 2022-03-05 ENCOUNTER — Encounter: Payer: Self-pay | Admitting: Gynecologic Oncology

## 2022-03-06 ENCOUNTER — Encounter: Payer: Self-pay | Admitting: Gynecologic Oncology

## 2022-03-06 ENCOUNTER — Other Ambulatory Visit: Payer: Self-pay

## 2022-03-06 ENCOUNTER — Inpatient Hospital Stay: Payer: Medicare Other | Attending: Gynecologic Oncology | Admitting: Gynecologic Oncology

## 2022-03-06 ENCOUNTER — Telehealth: Payer: Self-pay | Admitting: *Deleted

## 2022-03-06 VITALS — BP 117/79 | HR 75 | Temp 98.4°F | Ht 64.0 in | Wt 185.4 lb

## 2022-03-06 DIAGNOSIS — Z90710 Acquired absence of both cervix and uterus: Secondary | ICD-10-CM | POA: Diagnosis not present

## 2022-03-06 DIAGNOSIS — Z1509 Genetic susceptibility to other malignant neoplasm: Secondary | ICD-10-CM | POA: Diagnosis not present

## 2022-03-06 DIAGNOSIS — C541 Malignant neoplasm of endometrium: Secondary | ICD-10-CM

## 2022-03-06 DIAGNOSIS — Z9221 Personal history of antineoplastic chemotherapy: Secondary | ICD-10-CM | POA: Diagnosis not present

## 2022-03-06 DIAGNOSIS — Z8542 Personal history of malignant neoplasm of other parts of uterus: Secondary | ICD-10-CM | POA: Diagnosis not present

## 2022-03-06 DIAGNOSIS — Z148 Genetic carrier of other disease: Secondary | ICD-10-CM | POA: Insufficient documentation

## 2022-03-06 DIAGNOSIS — Z90722 Acquired absence of ovaries, bilateral: Secondary | ICD-10-CM | POA: Diagnosis not present

## 2022-03-06 DIAGNOSIS — Z1501 Genetic susceptibility to malignant neoplasm of breast: Secondary | ICD-10-CM | POA: Insufficient documentation

## 2022-03-06 NOTE — Progress Notes (Signed)
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 Endometrium, biopsy - HIGH GRADE CARCINOMA, SEE COMMENT. 2. Cervix, biopsy - UNREMARKABLE ENDOCERVICAL GLANDULAR AND SQUAMOUS MUCOSA. - MULTIPLE DETACHED FRAGMENTS OF HIGH GRADE CARCINOMA, MORPHOLOGICALLY SIMILAR TO SPECIMEN 1. Microscopic Comment 1. 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   04/21/2018 Imaging   US pelvis Abnormally thickened endometrium measuring up to 16 mm. In the setting of post-menopausal bleeding, endometrial sampling is indicated to exclude carcinoma. If results are benign, sonohysterogram should be considered for focal lesion work-up.    04/27/2018 Imaging   Ct abdomen and pelvis 1. Thickening of the endometrium identified compatible with endometrial carcinoma. 2. No findings to suggest metastatic adenopathy or distant metastatic disease. 3. Hiatal hernia 4.  Aortic Atherosclerosis (ICD10-I70.0).     05/02/2018 Tumor Marker   Patient's tumor was tested for the following markers: CA-125 Results of the tumor marker test revealed 65.3   05/03/2018 Pathology Results   1. Lymph node, sentinel, biopsy, right obturator - ONE OF ONE LYMPH NODES NEGATIVE FOR CARCINOMA (0/1). 2. Lymph node, sentinel, biopsy, left external iliac - FOUR OF FOUR LYMPH NODES NEGATIVE FOR CARCINOMA (0/4). 3. 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 TUMOR INVADES  LESS THAN ONE HALF OF THE MYOMETRIUM. SEE ONCOLOGY TABLE. -SEROSA: UNREMARKABLE. NO MALIGNANCY. - CERVIX: BENIGN SQUAMOUS AND ENDOCERVICAL MUCOSA. NO DYSPLASIA OR MALIGNANCY. - BILATERAL OVARIES: INCLUSION CYSTS. NO MALIGNANCY. - BILATERAL FALLOPIAN TUBES: UNREMARKABLE. NO MALIGNANCY. Microscopic Comment 3. UTERUS, CARCINOMA OR CARCINOSARCOMA Procedure: Total hysterectomy with bilateral salpingo-oophorectomy. Right obturator and left external iliac lymph node biopsies. Histologic type: Endometrioid adenocarcinoma with squamous differentiation. Histologic Grade: FIGO grade I. Myometrial invasion: Depth of invasion: 3 mm Myometrial thickness: 12 mm Uterine Serosa Involvement: Not identified. Cervical stromal involvement: Not identified. Extent of involvement of other organs: Uninvolved. Lymphovascular invasion: Not identified. Regional Lymph Nodes: Examined: 5 Sentinel 0 Non-sentinel 5 Total Lymph nodes with metastasis: 0 Isolated tumor cells (< 0.2 mm): 0 Micrometastasis: (> 0.2 mm and < 2.0 mm): 0 Macrometastasis: (> 2.0 mm): 0 Extracapsular extension: N/A. MMR / MSI testing: Will be ordered. Pathologic Stage Classification (pTNM, AJCC 8th edition): pT1a, pN0 FIGO Stage: IA Representative Tumor Block: 3D-G Comment: Pancytokeratin was performed on the lymph nodes and is negative   05/03/2018 Surgery   Operation: Robotic-assisted laparoscopic total hysterectomy with bilateral salpingoophorectomy, SLN biopsy    Surgeon: Donaciano Eva    Operative Findings:  : 6cm normal appearing uterus. Normal tubes and ovaries. Normal omentum and diaphragm. No suspicious nodes.      05/03/2018 Pathology Results   PERITONEAL WASHING(SPECIMEN 1 OF 1 COLLECTED 05/03/18): MALIGNANT CELLS CONSISTENT WITH METASTATIC ADENOCARCINOMA.   05/18/2018 Tumor Marker   Patient's tumor was tested for the following markers: CA-125 Results of the tumor marker test revealed 77.5   05/27/2018  Genetic Testing   MSH6 c.2731C>T pathogenic variant and GATA2 c.1232C>T VUS identified on the multicancer panel.  The Multi-Gene Panel offered by [facility] includes sequencing and/or deletion duplication testing of the following 85 genes: AIP, ALK,  APC, ATM, AXIN2,BAP1,  BARD1, BLM, BMPR1A, BRCA1, BRCA2, BRIP1, CASR, CDC73, CDH1, CDK4, CDKN1B, CDKN1C, CDKN2A (p14ARF), CDKN2A (p16INK4a), CEBPA, CHEK2, CTNNA1, DICER1, DIS3L2, EGFR (c.2369C>T, p.Thr790Met variant only), EPCAM (Deletion/duplication testing only), FH, FLCN, GATA2, GPC3, GREM1 (Promoter region deletion/duplication testing only), HOXB13 (c.251G>A, p.Gly84Glu), HRAS, KIT, MAX, MEN1, MET, MITF (c.952G>A, p.Glu318Lys variant only), MLH1, MSH2, MSH3, MSH6, MUTYH, NBN, NF1, NF2, NTHL1, PALB2, PDGFRA, PHOX2B, PMS2, POLD1, POLE, POT1, PRKAR1A, PTCH1, PTEN, RAD50, RAD51C, RAD51D, RB1, RECQL4, RET, RNF43, RUNX1, SDHAF2, SDHA (sequence changes only), SDHB, SDHC, SDHD, SMAD4, SMARCA4, SMARCB1, SMARCE1, STK11, SUFU, TERC, TERT, TMEM127, TP53, TSC1, TSC2, VHL, WRN and WT1.  The report date is 05/27/2018.   06/09/2018 Cancer Staging   Staging form: Corpus Uteri - Carcinoma and Carcinosarcoma, AJCC 8th Edition - Pathologic: Stage I (pT1, pN0, cM0) - Signed by Heath Lark, MD on 06/09/2018   06/15/2018 Procedure   Placement of single lumen port a cath via right internal jugular vein. The catheter tip lies at the cavo-atrial junction. A power injectable port a cath was placed and is ready for immediate use.     06/20/2018 - 10/26/2018 Chemotherapy   The patient had carboplatin and Taxol x 2 cycles, Cycle 3 Taxol at 50%, then cycle 4-6 without Taxol, carboplatin only   06/25/2018 - 06/27/2018 Hospital Admission   She was admitted to the hospital with deydration   11/23/2018 Imaging   1. 8 mm hyperattenuating nodule along the midline vaginal cuff. Coronal imaging suggests that the hyperattenuation is related to calcification, but enhancement in this nodule  cannot be excluded. Patient is status post hysterectomy in the interval since prior CT. While this finding may be related to prior surgery, close follow-up recommended. 2. Otherwise no evidence for metastatic disease in the abdomen or pelvis. 3. Large hiatal hernia with more than 50% of the stomach herniated into the lower chest, stable. 4.  Aortic Atherosclerois (ICD10-170.0)   12/01/2018 Procedure   Successful removal of implanted Port-A-Cath    She noted 3 weeks of postmenopausal bleeding.  On 03/30/2018 she reports this was quite heavy with cramps.  She is new to the [place] living in a retirement [place] and she was unable to find a provider in a timely manner.  She was referred by the retirement center to an urgent care center in [facility] who then triaged her to see Dr. Ihor Dow.  Given the patient's symptoms an endometrial biopsy was performed along with a pelvic ultrasound.  Documented exam by Dr. Ihor Dow states that there was a lesion at 6:00 on the cervix which was biopsied.  In addition to the endometrial biopsy being performed.  The endometrial biopsy revealed high-grade carcinoma with a comment stating the majority appears to be FIGO grade 3 endometrioid with scattered foci suggestive of serous phenotype.  The cervix revealed unremarkable endocervical glandular and squamous mucosa there were detached fragments of high-grade carcinoma similar to the endometrial biopsy tissue.  Ultrasound revealed a uterus 6.6 x 3 x 3.7 cm with no fibroids endometrium 1.6 cm right ovary normal left ovary not visualized.   The patient is retired Writer.  She states her last Pap smear was in March 2019 in [place].  She states she got a phone call from that office that her Pap smear was normal.   A CT scan was performed preop and noted below.  In summary no evidence of extrauterine disease.   The patient underwent surgical staging with a robotic assisted total  hysterectomy  BSO sentinel lymph node biopsy on May 03, 2018.  Due to her symptoms of abdominal bloating, elevated Ca1 25, and her daughter's history of Lynch syndrome her tinea washings were obtained.  Intraoperative findings were otherwise unremarkable and she had no gross extrauterine disease within normal omentum diaphragms and peritoneal surfaces, 6 cm normal-appearing uterus, normal tubes and ovaries, no suspicious nodes.  The pathology revealed positive malignant cells consistent with metastatic adenocarcinoma in the peritoneal washings.  The histopathology report revealed a FIGO grade 1 endometrioid endometrial adenocarcinoma measuring 2.2 cm and invading less than one half of the myometrium (3 of 12 mm), with no LVSI, and 5- sentinel lymph nodes, with normal cervix and adnexa.  Given the finding of positive washings, the fallopian tubes and ovaries were subsequently submitted for complete serial sectioning and no occult ovarian or fallopian tube carcinoma was identified.  MSI testing showed high instability, and mismatch repair protein IHC was abnormal with loss of nuclear expression of [place] 6.   She was subsequently seen by the genetics counselors on 05/18/2018 who drew blood for genetic screening including Lynch syndrome which was positive (mutation in MSH6).   G iven the presence of positive washings and the preoperative biopsy of high grade cancer, she was felt to represent high risk for recurrence, and therefore adjuvant chemotherapy with carboplatin and paclitaxel was recommended. She received chemotherapy between June 20, 2018 and October 26, 2018.  Chemotherapy was somewhat poorly tolerated by the patient who had severe bone pain, and issues with diabetes and dehydration.  She received 2 cycles of carboplatin with paclitaxel, a third cycle with Taxol at 50%, and then cycle 4-6 without Taxol carboplatin as a single agent.   Post treatment imaging on Nov 23, 2018 revealed an 8 mm  hyperattenuating nodule along the midline vaginal cuff this is well-circumscribed.  It appeared related to calcification.  Otherwise no evidence of metastatic disease in the abdomen and pelvis.   A follow-up PET/CT was ordered and performed on 02/23/2021 reassess the area at the vaginal cuff that had previously shown some nodularity and avidity.  On this follow-up PET scan there was no metabolic evidence of recurrent metastatic disease.  There is a stable benign postsurgical change at the hysterectomy margin that had calcified.  No other new disease was visualized.  The PET does show evidence of coronary artery disease and atherosclerosis.   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 She was started on ASA 81 mg. She had a CVA vs TIA in 07/2021.  Interval History: Patient reports doing well.  She denies any abdominal or pelvic pain.  She continues to have hot flashes.  He remains on her Plavix and reports some easy bruising.  She denies any vaginal bleeding or discharge.  She endorses normal bowel function.  She continues to have some urinary incontinence, unchanged.  Is overdue for colonoscopy, was told to call back later in the year when she last called the gastroenterology office.  Past Medical/Surgical History: Past Medical History:  Diagnosis Date   Allergy    Anxiety    on meds   Arthritis    on PRN meds   Cataract    bilateral cat. ext. with lens implant- in [place]   Diabetes mellitus without complication ([place])    diet controlled. States always has borderline HgbA1C   Endometrial cancer ([place])    endometrial CA-2019   Family history of colon cancer  daughter 76 colon CA   Family history of genetic mutation for hereditary nonpolyposis colorectal cancer ([facility])    GERD (gastroesophageal reflux disease)    on meds   High cholesterol    on meds   History of kidney stones    years    Hypertension    on meds   Neuromuscular  disorder ([place])    Neuropathy    Sensation of pressure in bladder area    Stroke ([place]) 07/17/2019   TIA (transient ischemic attack) 07/2021   Uterine cramping     Past Surgical History:  Procedure Laterality Date   burnt the nerves  Right 04/29/2018   in right knee    burnt the nerves  Left 04/22/2018   left knee nerve    COLONOSCOPY  12/2018   [place]- 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   cystoscopy kidney stone   1985   HYSTERECTOMY ABDOMINAL WITH SALPINGO-OOPHORECTOMY  2019   IR CT HEAD LTD  07/18/2019   IR IMAGING GUIDED PORT INSERTION  06/15/2018   IR PERCUTANEOUS ART THROMBECTOMY/INFUSION INTRACRANIAL INC DIAG ANGIO  07/18/2019   IR REMOVAL TUN ACCESS W/ PORT W/O FL MOD SED  12/01/2018   LOOP RECORDER INSERTION N/A 07/19/2019   Procedure: LOOP RECORDER INSERTION;  Surgeon: Constance Haw, MD;  Location: [facility];  Service: Cardiovascular;  Laterality: N/A;   POLYPECTOMY     RADIOLOGY WITH ANESTHESIA N/A 07/17/2019   Procedure: IR WITH ANESTHESIA;  Surgeon: Luanne Bras, MD;  Location: [facility];  Service: Radiology;  Laterality: N/A;   REPLACEMENT TOTAL KNEE BILATERAL Bilateral 2018   Right x 2. 2019 undergoing intermittent injection "RFA" procedures with Dr. Sheliah Mends [facility]   REVISION TOTAL KNEE ARTHROPLASTY Right 03/2017   ROBOTIC ASSISTED TOTAL HYSTERECTOMY WITH BILATERAL SALPINGO OOPHERECTOMY N/A 05/03/2018   Procedure: XI ROBOTIC ASSISTED TOTAL HYSTERECTOMY WITH BILATERAL SALPINGO OOPHORECTOMY;  Surgeon: Everitt Amber, MD;  Location: [facility];  Service: Gynecology;  Laterality: N/A;   SENTINEL NODE BIOPSY N/A 05/03/2018   Procedure: SENTINEL NODE BIOPSY;  Surgeon: Everitt Amber, MD;  Location: [facility];  Service: Gynecology;  Laterality: N/A;   TONSILLECTOMY AND ADENOIDECTOMY     TUBAL LIGATION      Family History  Adopted: Yes  Problem Relation Age of Onset   Colon cancer Daughter 23       Lynch syndrome - MSH6 +   Colon polyps Daughter 41    Healthy Son    Esophageal cancer Neg Hx    Rectal cancer Neg Hx    Stomach cancer Neg Hx     Social History   Socioeconomic History   Marital status: Divorced    Spouse name: Not on file   Number of children: 2   Years of education: Not on file   Highest education level: Not on file  Occupational History   Occupation: retired Marine scientist  Tobacco Use   Smoking status: Every Day    Packs/day: 0.25    Years: 40.00    Total pack years: 10.00    Types: Cigarettes   Smokeless tobacco: Never   Tobacco comments:    4-5 loose cigarettes per day , over 20 year hx of smoking   Vaping Use   Vaping Use: Never used  Substance and Sexual Activity   Alcohol use: Yes    Alcohol/week: 14.0 standard drinks of alcohol    Types: 14 Glasses of wine per week    Comment: about 2 glasses wine daily.  Drug use: Never   Sexual activity: Not Currently    Birth control/protection: None  Other Topics Concern   Not on file  Social History Narrative   Lives alone   Social Determinants of Health   Financial Resource Strain: Not on file  Food Insecurity: Not on file  Transportation Needs: Not on file  Physical Activity: Not on file  Stress: Not on file  Social Connections: Not on file    Current Medications:  Current Outpatient Medications:    acetaminophen (TYLENOL) 500 MG tablet, Take 1,000 mg by mouth once as needed for mild pain or moderate pain. Extra strength, Disp: , Rfl:    amitriptyline (ELAVIL) 10 MG tablet, Take 1 tablet (10 mg total) by mouth at bedtime., Disp: 90 tablet, Rfl: 3   amitriptyline (ELAVIL) 25 MG tablet, Take 1 tablet (25 mg total) by mouth at bedtime., Disp: 90 tablet, Rfl: 3   atenolol (TENORMIN) 50 MG tablet, Take 75 mg by mouth daily., Disp: , Rfl:    atorvastatin (LIPITOR) 80 MG tablet, Take 80 mg by mouth daily., Disp: , Rfl:    Biotin 5000 MCG TABS, Take 5,000 mcg by mouth daily., Disp: , Rfl:    Cholecalciferol (VITAMIN D3) 50 MCG (2000 UT) TABS, Take 1  tablet by mouth daily in the afternoon., Disp: , Rfl:    clopidogrel (PLAVIX) 75 MG tablet, Take 1 tablet by mouth daily., Disp: , Rfl:    Coenzyme Q10 (CO Q 10 PO), Take 200 mg by mouth daily., Disp: , Rfl:    cyclobenzaprine (FLEXERIL) 5 MG tablet, Take 5 mg by mouth 3 (three) times daily as needed for muscle spasms., Disp: , Rfl:    Lancets (ONETOUCH DELICA PLUS [id number]) MISC, Apply topically daily., Disp: , Rfl:    lisinopril (ZESTRIL) 10 MG tablet, Take 10 mg by mouth daily., Disp: , Rfl:    metFORMIN (GLUCOPHAGE) 500 MG tablet, TAKE 1 TABLET BY MOUTH TWICE A DAY WITH MEALS, Disp: 60 tablet, Rfl: 1   Omega-3 Fatty Acids (FISH OIL) 1200 MG CAPS, Take 1,200 mg by mouth daily. , Disp: , Rfl:    omeprazole (PRILOSEC) 40 MG capsule, Take 1 capsule (40 mg total) by mouth daily., Disp: 90 capsule, Rfl: 3   ONETOUCH ULTRA test strip, USE TO CHECK BLOOD SUGAR ONCE DAILY E11.69, Disp: , Rfl:    Propylene Glycol 0.6 % SOLN, Place 2 drops into both eyes 4 (four) times daily., Disp: , Rfl:    senna-docusate (SENOKOT-S) 8.6-50 MG tablet, Take by mouth., Disp: , Rfl:    amoxicillin (AMOXIL) 500 MG capsule, Take 2,000 mg by mouth once as needed (dental work). (Patient not taking: Reported on 03/05/2022), Disp: , Rfl:    saccharomyces boulardii (FLORASTOR) 250 MG capsule, Take by mouth. (Patient not taking: Reported on 03/05/2022), Disp: , Rfl:   Review of Systems: + Hot flashes, joint pain, bruising, anxiety. Denies appetite changes, fevers, chills, fatigue, unexplained weight changes. Denies hearing loss, neck lumps or masses, mouth sores, ringing in ears or voice changes. Denies cough or wheezing.  Denies shortness of breath. Denies chest pain or palpitations. Denies leg swelling. Denies abdominal distention, pain, blood in stools, constipation, diarrhea, nausea, vomiting, or early satiety. Denies pain with intercourse, dysuria, frequency, hematuria or incontinence. Denies pelvic pain, vaginal bleeding  or vaginal discharge.   Denies back pain or muscle pain/cramps. Denies itching, rash, or wounds. Denies dizziness, headaches, numbness or seizures. Denies swollen lymph nodes or glands, denies easy bleeding. Denies  depression, confusion, or decreased concentration.  Physical Exam: BP 117/79 (BP Location: Left Arm, Patient Position: Sitting)   Pulse 75   Temp 98.4 F (36.9 C)   Ht $R'5\' 4"'kU$  (1.626 m)   Wt 185 lb 6.4 oz (84.1 kg)   SpO2 98%   BMI 31.82 kg/m  General: Alert, oriented, no acute distress. HEENT: Normocephalic, atraumatic, sclera anicteric. Chest: Clear to auscultation bilaterally.  Port site clean. Cardiovascular: Regular rate and rhythm, no murmurs. Abdomen: soft, nontender.  Normoactive bowel sounds.  No masses or hepatosplenomegaly appreciated.  Well-healed incisions. Extremities: Grossly normal range of motion.  Warm, well perfused.  No edema bilaterally. Skin: No rashes or lesions noted. Lymphatics: No cervical, supraclavicular, or inguinal adenopathy. GU: Normal appearing external genitalia without erythema, excoriation, or lesions.  Speculum exam reveals mildly atrophic vaginal mucosa, no lesions seen.  Cuff intact.  Bimanual exam reveals no nodularity or masses.  Rectovaginal exam confirms findings.  Laboratory & Radiologic Studies: None new  Assessment & Plan: Jordan Martinez is a 71 y.o. woman with Stage IA grade 3 endometrial cancer with + washings who presents for surveillance visit. MSI high, MMR abnormal (loss of [place] 6). 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  S/p 6 cycles platinum and taxane chemotherapy completed April 22nd, 2020.  Complete clinical response, no evidence of disease on exam.  Post-treatment imaging revealed nodule at vaginal cuff (68mm). No concerning findings on examination and stable/benign appearing on follow-up imaging.  Last imaging was in 02/2021 (PET) and negative for evidence of metastatic disease.  Patient is doing well and is NED  on exam today.  I encouraged her to reach back out to the gastroenterology office to see if she can get her colonoscopy scheduled.  I offered referral to urology or urogynecology in the setting of her urinary symptoms.  Her preference is to hold off on any intervention at this time.   We reviewed signs and symptoms that would be concerning for recurrence.  We will continue with surveillance visits every 6 months until April 2025.  I stressed the importance of calling to see me sooner if she develops any new and concerning symptoms.    20 minutes of total time was spent for this patient encounter, including preparation, face-to-face counseling with the patient and coordination of care, and documentation of the encounter.  Jeral Pinch, MD  Division of Gynecologic Oncology  Department of Obstetrics and Gynecology  [facility]

## 2022-03-06 NOTE — Telephone Encounter (Addendum)
Patient called and stated "I had surgery back in 2019 and I'm 71 yrs old; how much longer will I have these hot flashe\s?"  Explained that the message would be given to Dr Berline Lopes and the office would call her back next week.

## 2022-03-06 NOTE — Patient Instructions (Signed)
It was good to see you today.  I do not see or feel any evidence of cancer recurrence on your exam.  Please call sometime after the new year to schedule a visit to see me in March.  As always, please call if you develop any new and concerning symptoms.

## 2022-03-10 ENCOUNTER — Other Ambulatory Visit: Payer: Self-pay | Admitting: Adult Health

## 2022-03-10 NOTE — Telephone Encounter (Signed)
Pt is aware of ho flashes being different with each person. She was thankful for the call and stated "I will just patiently wait it out"

## 2022-03-12 ENCOUNTER — Encounter: Payer: Self-pay | Admitting: Gastroenterology

## 2022-03-16 ENCOUNTER — Telehealth: Payer: Self-pay

## 2022-03-16 NOTE — Telephone Encounter (Signed)
Patient called, explained AF was seen on her LINQ and Dr. Curt Bears would like patient to be seen at AF clinic to discuss [facility]. Patient agreeable and voiced understanding.

## 2022-03-16 NOTE — Telephone Encounter (Signed)
LINQ alert received.  7 AF events, all from 9/10, longest duration 1hr 55mn, mean HR's in the 90's No OAC - route to triage.  Routing to Dr. CCurt Bearsfor AF confirmation prior to AF clinic referral.

## 2022-03-18 ENCOUNTER — Encounter (HOSPITAL_COMMUNITY): Payer: Self-pay | Admitting: Nurse Practitioner

## 2022-03-18 ENCOUNTER — Ambulatory Visit (HOSPITAL_COMMUNITY)
Admission: RE | Admit: 2022-03-18 | Discharge: 2022-03-18 | Disposition: A | Discharge status: Home / Self Care | Payer: Medicare Other | Source: Ambulatory Visit | Attending: Nurse Practitioner | Admitting: Nurse Practitioner

## 2022-03-18 VITALS — BP 152/66 | HR 65 | Ht 64.0 in | Wt 182.6 lb

## 2022-03-18 DIAGNOSIS — Z8673 Personal history of transient ischemic attack (TIA), and cerebral infarction without residual deficits: Secondary | ICD-10-CM | POA: Diagnosis present

## 2022-03-18 DIAGNOSIS — C799 Secondary malignant neoplasm of unspecified site: Secondary | ICD-10-CM | POA: Insufficient documentation

## 2022-03-18 DIAGNOSIS — I4891 Unspecified atrial fibrillation: Secondary | ICD-10-CM | POA: Diagnosis not present

## 2022-03-18 DIAGNOSIS — D6869 Other thrombophilia: Secondary | ICD-10-CM | POA: Diagnosis not present

## 2022-03-18 DIAGNOSIS — Z7901 Long term (current) use of anticoagulants: Secondary | ICD-10-CM | POA: Insufficient documentation

## 2022-03-18 DIAGNOSIS — I10 Essential (primary) hypertension: Secondary | ICD-10-CM | POA: Insufficient documentation

## 2022-03-18 DIAGNOSIS — E119 Type 2 diabetes mellitus without complications: Secondary | ICD-10-CM | POA: Insufficient documentation

## 2022-03-18 MED ORDER — APIXABAN 5 MG PO TABS: 5.0000 mg | ORAL_TABLET | Freq: Two times a day (BID) | ORAL | 3 refills | Status: DC

## 2022-03-18 NOTE — Progress Notes (Signed)
Primary Care Physician: Nadara Eaton, MD Referring Physician: Device clinic EP: Dr. Anne Fu Jordan Martinez is a 71 y.o. 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 She had a linq implanted after the CVA in 2021, and just recently showed afib, 5 episodes, all on 9/10, with longest episode lasting 1 hour. Pt does not know any reason or trigger occuring on the 10 th.   She was referred to afib clinic, 9/13, for new onset afib for change of anticoagulation with h/o prior stroke.  She has been on Plavix since her stroke in January of this year, prior to that asa daily. She has tolerated Plavix without issues with bleeding.   In the clinic today, she is in SR. We discussed changing  anticoagulation to be more specific to prevent stroke. I discussed with Jordan Austin, NP, who follows pt in neurology, and she has no issues to stop plavix and start eliquis 5 mg bid. I discussed this drug, risk vrs benefit with pt and significant other, and she is in agreement to change. .   Today, she denies symptoms of palpitations, chest pain, shortness of breath, orthopnea, PND, lower extremity edema, dizziness, presyncope, syncope, or neurologic sequela. The patient is tolerating medications without difficulties and is otherwise without complaint today.   Past Medical History:  Diagnosis Date   Allergy    Anxiety    on meds   Arthritis    on PRN meds   Cataract    bilateral cat. ext. with lens implant- in [place]   Diabetes mellitus without complication (HCC)    diet controlled. States always has borderline HgbA1C   Endometrial cancer (HCC)    endometrial CA-2019   Family history of colon cancer    daughter 62 colon CA   Family history of genetic mutation for hereditary nonpolyposis colorectal cancer (HNPCC)    GERD (gastroesophageal  reflux disease)    on meds   High cholesterol    on meds   History of kidney stones    years    Hypertension    on meds   Neuromuscular disorder (HCC)    Neuropathy    Sensation of pressure in bladder area    Stroke (HCC) 07/17/2019   TIA (transient ischemic attack) 07/2021   Uterine cramping    Past Surgical History:  Procedure Laterality Date   burnt the nerves  Right 04/29/2018   in right knee    burnt the nerves  Left 04/22/2018   left knee nerve    COLONOSCOPY  12/2018   [place]- 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   cystoscopy kidney stone   1985   HYSTERECTOMY ABDOMINAL WITH SALPINGO-OOPHORECTOMY  2019   IR CT HEAD LTD  07/18/2019   IR IMAGING GUIDED PORT INSERTION  06/15/2018   IR PERCUTANEOUS ART THROMBECTOMY/INFUSION INTRACRANIAL INC DIAG ANGIO  07/18/2019   IR REMOVAL TUN ACCESS W/ PORT W/O FL MOD SED  12/01/2018   LOOP RECORDER INSERTION N/A 07/19/2019   Procedure: LOOP RECORDER INSERTION;  Surgeon: Regan Lemming, MD;  Location: [facility];  Service: Cardiovascular;  Laterality: N/A;   POLYPECTOMY     RADIOLOGY WITH ANESTHESIA N/A 07/17/2019   Procedure: IR WITH ANESTHESIA;  Surgeon: Julieanne Cotton, MD;  Location: [facility] OR;  Service: Radiology;  Laterality: N/A;   REPLACEMENT TOTAL KNEE  BILATERAL Bilateral 2018   Right x 2. 2019 undergoing intermittent injection "RFA" procedures with Dr. Sheliah Mends [facility]   REVISION TOTAL KNEE ARTHROPLASTY Right 03/2017   ROBOTIC ASSISTED TOTAL HYSTERECTOMY WITH BILATERAL SALPINGO OOPHERECTOMY N/A 05/03/2018   Procedure: XI ROBOTIC ASSISTED TOTAL HYSTERECTOMY WITH BILATERAL SALPINGO OOPHORECTOMY;  Surgeon: Everitt Amber, MD;  Location: [facility];  Service: Gynecology;  Laterality: N/A;   SENTINEL NODE BIOPSY N/A 05/03/2018   Procedure: SENTINEL NODE BIOPSY;  Surgeon: Everitt Amber, MD;  Location: [facility];  Service: Gynecology;  Laterality: N/A;   TONSILLECTOMY AND ADENOIDECTOMY     TUBAL LIGATION       Current Outpatient Medications  Medication Sig Dispense Refill   acetaminophen (TYLENOL) 500 MG tablet Take 1,000 mg by mouth once as needed for mild pain or moderate pain. Extra strength     amitriptyline (ELAVIL) 10 MG tablet Take 1 tablet (10 mg total) by mouth at bedtime. (Patient taking differently: Take 10 mg by mouth daily after supper.) 90 tablet 3   amitriptyline (ELAVIL) 25 MG tablet Take 1 tablet (25 mg total) by mouth at bedtime. 90 tablet 3   amoxicillin (AMOXIL) 500 MG capsule Take 2,000 mg by mouth once as needed (dental work).     atenolol (TENORMIN) 50 MG tablet Take 75 mg by mouth daily.     atorvastatin (LIPITOR) 80 MG tablet Take 80 mg by mouth daily.     Biotin 5000 MCG TABS Take 5,000 mcg by mouth daily.     Cholecalciferol (VITAMIN D3) 50 MCG (2000 UT) TABS Take 1 tablet by mouth daily in the afternoon.     clopidogrel (PLAVIX) 75 MG tablet Take 1 tablet by mouth daily.     Coenzyme Q10 (CO Q 10 PO) Take 200 mg by mouth daily.     cyclobenzaprine (FLEXERIL) 5 MG tablet Take 5 mg by mouth 3 (three) times daily as needed for muscle spasms.     hydrocortisone 2.5 % cream Apply 1 Application topically 2 (two) times daily.     Lancets (ONETOUCH DELICA PLUS [id number]) MISC Apply topically daily.     lisinopril (ZESTRIL) 10 MG tablet Take 10 mg by mouth daily.     metFORMIN (GLUCOPHAGE) 500 MG tablet TAKE 1 TABLET BY MOUTH TWICE A DAY WITH MEALS 60 tablet 1   Omega-3 Fatty Acids (FISH OIL) 1200 MG CAPS Take 1,200 mg by mouth daily.      omeprazole (PRILOSEC) 40 MG capsule Take 1 capsule (40 mg total) by mouth daily. 90 capsule 3   ONETOUCH ULTRA test strip USE TO CHECK BLOOD SUGAR ONCE DAILY E11.69     pimecrolimus (ELIDEL) 1 % cream Apply 1 Application topically 2 (two) times daily.     Propylene Glycol 0.6 % SOLN Place 2 drops into both eyes 4 (four) times daily.     SACCHAROMYCES BOULARDII PO Liquid form-     senna-docusate (SENOKOT-S) 8.6-50 MG tablet Take 1 tablet by  mouth as needed.     No current facility-administered medications for this encounter.    Allergies  Allergen Reactions   Amlodipine     Blurred vision    Compazine [Prochlorperazine Edisylate]     Stroke symptoms,   Gabapentin     Blurred vision and nystagmus   Lyrica [Pregabalin]     Blurred Vision    Prochlorperazine Other (See Comments)    Could not tolerate   Sertraline Other (See Comments)    Visual disturbances.    Chlorhexidine  Rash    Blisters and redness to chest    Social History   Socioeconomic History   Marital status: Divorced    Spouse name: Not on file   Number of children: 2   Years of education: Not on file   Highest education level: Not on file  Occupational History   Occupation: retired Marine scientist  Tobacco Use   Smoking status: Every Day    Packs/day: 0.25    Years: 40.00    Total pack years: 10.00    Types: Cigarettes   Smokeless tobacco: Never   Tobacco comments:    4-5 loose cigarettes per day , over 20 year hx of smoking   Vaping Use   Vaping Use: Never used  Substance and Sexual Activity   Alcohol use: Yes    Alcohol/week: 14.0 standard drinks of alcohol    Types: 14 Glasses of wine per week    Comment: about 2 glasses wine daily.    Drug use: Never   Sexual activity: Not Currently    Birth control/protection: None  Other Topics Concern   Not on file  Social History Narrative   Lives alone   Social Determinants of Health   Financial Resource Strain: Not on file  Food Insecurity: Not on file  Transportation Needs: Not on file  Physical Activity: Not on file  Stress: Not on file  Social Connections: Not on file  Intimate Partner Violence: Not on file    Family History  Adopted: Yes  Problem Relation Age of Onset   Colon cancer Daughter 70       Lynch syndrome - MSH6 +   Colon polyps Daughter 70   Healthy Son    Esophageal cancer Neg Hx    Rectal cancer Neg Hx    Stomach cancer Neg Hx     ROS- All systems are reviewed and  negative except as per the HPI above  Physical Exam: Vitals:   03/18/22 0855  Pulse: 65  Weight: 82.8 kg  Height: $Remove'5\' 4"'ozPTunk$  (1.626 m)   Wt Readings from Last 3 Encounters:  03/18/22 82.8 kg  03/06/22 84.1 kg  11/13/21 84.7 kg    Labs: Lab Results  Component Value Date   NA 141 07/18/2019   K 4.1 07/18/2019   CL 110 07/18/2019   CO2 23 07/18/2019   GLUCOSE 204 (H) 07/18/2019   BUN 15 07/18/2019   CREATININE 0.71 07/18/2019   CALCIUM 8.3 (L) 07/18/2019   MG 2.4 06/26/2018   Lab Results  Component Value Date   INR 0.9 07/17/2019   Lab Results  Component Value Date   CHOL 224 (H) 07/18/2019   HDL 86 07/18/2019   LDLCALC 120 (H) 07/18/2019   TRIG 91 07/18/2019     GEN- The patient is well appearing, alert and oriented x 3 today.   Head- normocephalic, atraumatic Eyes-  Sclera clear, conjunctiva pink Ears- hearing intact Oropharynx- clear Neck- supple, no JVP Lymph- no cervical lymphadenopathy Lungs- Clear to ausculation bilaterally, normal work of breathing Heart- Regular rate and rhythm, no murmurs, rubs or gallops, PMI not laterally displaced GI- soft, NT, ND, + BS Extremities- no clubbing, cyanosis, or edema MS- no significant deformity or atrophy Skin- no rash or lesion Psych- euthymic mood, full affect Neuro- strength and sensation are intact  EKG-Vent. rate 65 BPM PR interval 194 ms QRS duration 90 ms QT/QTcB 414/430 ms P-R-T axes 33 25 19 Normal sinus rhythm RSR' or QR pattern in V1 suggests right ventricular  conduction delay Borderline ECG When compared with ECG of 26-Jun-2018 07:15, PREVIOUS ECG IS PRESENT  LINQ alert received.  7 AF events, all from 9/10, longest duration 1hr 66min, mean HR's in the 90's No OAC - route to triage.  Echo-1. Left ventricular ejection fraction, by visual estimation, is 65 to 70%. The left ventricle has hyperdynamic function. There is mildly increased left ventricular hypertrophy. 2. Left ventricular diastolic  parameters are consistent with Grade I diastolic dysfunction (impaired relaxation). 3. The left ventricle has no regional wall motion abnormalities. 4. Global right ventricle has hyperdynamic systolic function.The right ventricular size is normal. No increase in right ventricular wall thickness. 5. Left atrial size was normal. 6. Right atrial size was normal. 7. Mild mitral annular calcification. 8. The mitral valve is normal in structure. No evidence of mitral valve regurgitation. No evidence of mitral stenosis. 9. The tricuspid valve is normal in structure. 10. The aortic valve is normal in structure. Aortic valve regurgitation is not visualized. Mild aortic valve sclerosis without stenosis. 11. The pulmonic valve was normal in structure. Pulmonic valve regurgitation is not visualized. 12. The inferior vena cava is normal in size with greater than 50% respiratory variability, suggesting right atrial pressure of 3 mmHg.    Assessment and Plan:  1. H/o CVA with new afib seen on Linq 03/15/22 (implanted in 2021) She was asymptomatic with afib  We discussed need for change in anticoagulation form Plavix to eliquis 5 mg bid (J. McCue, neuro NP, gave ok  with the change) to address stroke that could occur with afib CHA2DS2VASc  score of at least 5 Risk vrs benefit of drug discussed  She is already on atenolol 75 mg daily with controlled v rates while in afib, so will not change beta blocker at this time 30 day free card given for eliquis   She would like to be referred to discuss Watchman in the future F/u in afib clinic in 3 weeks for bmet/cbc Continue monitoring with Linq thru Dr. Elinor Parkinson clinic   Jordan Martinez, [facility] [address] [place], [place] [address] [phone]

## 2022-03-18 NOTE — Patient Instructions (Signed)
Stop plavix   Start Eliquis 5mg twice a day 

## 2022-03-20 ENCOUNTER — Telehealth: Payer: Self-pay | Admitting: *Deleted

## 2022-03-20 NOTE — Telephone Encounter (Signed)
Called pt. To verify if she was taking blood thinner,pt.recently started on Eliquis on Wednesday 03/18/22 twice a day for A- FIB,OV scheduled with provider on 03/31/22 but procedure appointment kept for 04/29/22,explained to pt. Our protocol that she needed a OV prior to procedure r/t blood thinner all questions answered call back #   given and encouraged to call with any concerns or questions,verbalized understanding.

## 2022-03-23 ENCOUNTER — Ambulatory Visit: Payer: Medicare Other | Admitting: Gynecologic Oncology

## 2022-03-23 NOTE — Progress Notes (Signed)
Carelink Summary Report / Loop Recorder 

## 2022-03-24 LAB — CUP PACEART REMOTE DEVICE CHECK
Date Time Interrogation Session: 20230916230645
Implantable Pulse Generator Implant Date: 20210113

## 2022-03-27 ENCOUNTER — Ambulatory Visit: Payer: Medicare Other | Attending: Cardiology | Admitting: Cardiology

## 2022-03-27 ENCOUNTER — Encounter: Payer: Self-pay | Admitting: Cardiology

## 2022-03-27 VITALS — BP 122/64 | HR 67 | Ht 64.0 in | Wt 182.0 lb

## 2022-03-27 DIAGNOSIS — I4891 Unspecified atrial fibrillation: Secondary | ICD-10-CM

## 2022-03-27 DIAGNOSIS — I63412 Cerebral infarction due to embolism of left middle cerebral artery: Secondary | ICD-10-CM

## 2022-03-27 NOTE — Progress Notes (Signed)
Electrophysiology Office Note:    Date:  03/27/2022   ID:  Jordan Martinez, DOB July 01, 1951, MRN [id number]  PCP:  Javier Glazier, MD  [facility] Cardiologist:  None  [facility] Electrophysiologist:  Vickie Epley, MD   Referring MD: Javier Glazier, MD   Chief Complaint: Atrial fibrillation  History of Present Illness:    Jordan Martinez is a 71 y.o. female who presents for an evaluation of atrial fibrillation at the request of Roderic Palau. 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  She has a loop recorder in place which is only recently shown episodes of atrial fibrillation.  She has been started on Eliquis.  Today, she is accompanied by her best friend.  She notes that her second stroke in 07/2021 had presented completely different than her initial stroke. Afterwards she completed 8 weeks of therapy to improve her writing and mobility.  She received a phone call one week ago and found out she had 7 episodes of Afib in a period of 1 hour. She confirms that at her appointment with Roderic Palau, Plavix was stopped and she started Eliquis. She has now been on Eliquis for 1 week, with no bleeding issues so far.  Generally she endorses well controlled blood pressures at home.  She has started taking atorvastatin in the evenings and reports improvement in her cholesterol levels.  She denies any palpitations, chest pain, shortness of breath, or peripheral edema. No lightheadedness, headaches, syncope, orthopnea, or PND.     Past Medical History:  Diagnosis Date   Allergy    Anxiety    on meds   Arthritis    on PRN meds   Cataract    bilateral cat. ext. with lens implant- in [place]   Diabetes mellitus without complication ([place])    diet controlled. States always has borderline HgbA1C   Endometrial cancer ([place])    endometrial CA-2019   Family history of colon cancer     daughter 35 colon CA   Family history of genetic mutation for hereditary nonpolyposis colorectal cancer (HNPCC)    GERD (gastroesophageal reflux disease)    on meds   High cholesterol    on meds   History of kidney stones    years    Hypertension    on meds   Neuromuscular disorder ([place])    Neuropathy    Sensation of pressure in bladder area    Stroke ([place]) 07/17/2019   TIA (transient ischemic attack) 07/2021   Uterine cramping     Past Surgical History:  Procedure Laterality Date   burnt the nerves  Right 04/29/2018   in right knee    burnt the nerves  Left 04/22/2018   left knee nerve    COLONOSCOPY  12/2018   [place]- 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   cystoscopy kidney stone   1985   HYSTERECTOMY ABDOMINAL WITH SALPINGO-OOPHORECTOMY  2019   IR CT HEAD LTD  07/18/2019   IR IMAGING GUIDED PORT INSERTION  06/15/2018   IR PERCUTANEOUS ART THROMBECTOMY/INFUSION INTRACRANIAL INC DIAG ANGIO  07/18/2019   IR REMOVAL TUN ACCESS W/ PORT W/O FL MOD SED  12/01/2018   LOOP RECORDER INSERTION N/A 07/19/2019   Procedure: LOOP RECORDER INSERTION;  Surgeon: Constance Haw, MD;  Location: [place] [facility];  Service: Cardiovascular;  Laterality: N/A;   POLYPECTOMY     RADIOLOGY WITH ANESTHESIA N/A 07/17/2019   Procedure: IR  WITH ANESTHESIA;  Surgeon: Luanne Bras, MD;  Location: [place];  Service: Radiology;  Laterality: N/A;   REPLACEMENT TOTAL KNEE BILATERAL Bilateral 2018   Right x 2. 2019 undergoing intermittent injection "RFA" procedures with Dr. Sheliah Mends [facility]   REVISION TOTAL KNEE ARTHROPLASTY Right 03/2017   ROBOTIC ASSISTED TOTAL HYSTERECTOMY WITH BILATERAL SALPINGO OOPHERECTOMY N/A 05/03/2018   Procedure: XI ROBOTIC ASSISTED TOTAL HYSTERECTOMY WITH BILATERAL SALPINGO OOPHORECTOMY;  Surgeon: Everitt Amber, MD;  [facility];  Service: Gynecology;  Laterality: N/A;   SENTINEL NODE BIOPSY N/A 05/03/2018   Procedure: SENTINEL NODE BIOPSY;  Surgeon:  Everitt Amber, MD;  [facility];  Service: Gynecology;  Laterality: N/A;   TONSILLECTOMY AND ADENOIDECTOMY     TUBAL LIGATION      Current Medications: Current Meds  Medication Sig   acetaminophen (TYLENOL) 500 MG tablet Take 1,000 mg by mouth once as needed for mild pain or moderate pain. Extra strength   amitriptyline (ELAVIL) 10 MG tablet Take 1 tablet (10 mg total) by mouth at bedtime. (Patient taking differently: Take 10 mg by mouth daily after supper.)   amitriptyline (ELAVIL) 25 MG tablet Take 1 tablet (25 mg total) by mouth at bedtime.   amoxicillin (AMOXIL) 500 MG capsule Take 2,000 mg by mouth once as needed (dental work).   apixaban (ELIQUIS) 5 MG TABS tablet Take 1 tablet (5 mg total) by mouth 2 (two) times daily.   atenolol (TENORMIN) 50 MG tablet Take 75 mg by mouth daily.   atorvastatin (LIPITOR) 80 MG tablet Take 80 mg by mouth daily.   Biotin 5000 MCG TABS Take 5,000 mcg by mouth daily.   Cholecalciferol (VITAMIN D3) 50 MCG (2000 UT) TABS Take 1 tablet by mouth daily in the afternoon.   clotrimazole (LOTRIMIN) 1 % cream Apply topically 2 (two) times daily.   Coenzyme Q10 (CO Q 10 PO) Take 200 mg by mouth daily.   cyclobenzaprine (FLEXERIL) 5 MG tablet Take 5 mg by mouth 3 (three) times daily as needed for muscle spasms.   hydrocortisone 2.5 % cream Apply 1 Application topically 2 (two) times daily.   Lancets (ONETOUCH DELICA PLUS [id number]) MISC Apply topically daily.   lisinopril (ZESTRIL) 10 MG tablet Take 10 mg by mouth daily.   metFORMIN (GLUCOPHAGE) 500 MG tablet TAKE 1 TABLET BY MOUTH TWICE A DAY WITH MEALS   Omega-3 Fatty Acids (FISH OIL) 1200 MG CAPS Take 1,200 mg by mouth daily.    omeprazole (PRILOSEC) 40 MG capsule Take 1 capsule (40 mg total) by mouth daily.   ONETOUCH ULTRA test strip USE TO CHECK BLOOD SUGAR ONCE DAILY E11.69   Propylene Glycol 0.6 % SOLN Place 2 drops into both eyes 4 (four) times daily.   SACCHAROMYCES BOULARDII PO Liquid form-    senna-docusate (SENOKOT-S) 8.6-50 MG tablet Take 1 tablet by mouth as needed.     Allergies:   Amlodipine, Compazine [prochlorperazine edisylate], Gabapentin, Lyrica [pregabalin], Prochlorperazine, Sertraline, and Chlorhexidine   Social History   Socioeconomic History   Marital status: Divorced    Spouse name: Not on file   Number of children: 2   Years of education: Not on file   Highest education level: Not on file  Occupational History   Occupation: retired Marine scientist  Tobacco Use   Smoking status: Every Day    Packs/day: 0.25    Years: 40.00    Total pack years: 10.00    Types: Cigarettes   Smokeless tobacco: Never   Tobacco  comments:    4-5 loose cigarettes per day , over 20 year hx of smoking   Vaping Use   Vaping Use: Never used  Substance and Sexual Activity   Alcohol use: Yes    Alcohol/week: 14.0 standard drinks of alcohol    Types: 14 Glasses of wine per week    Comment: about 2 glasses wine daily.    Drug use: Never   Sexual activity: Not Currently    Birth control/protection: None  Other Topics Concern   Not on file  Social History Narrative   Lives alone   Social Determinants of Health   Financial Resource Strain: Not on file  Food Insecurity: Not on file  Transportation Needs: Not on file  Physical Activity: Not on file  Stress: Not on file  Social Connections: Not on file     Family History: The patient's family history includes Colon cancer (age of onset: 24) in her daughter; Colon polyps (age of onset: 80) in her daughter; Healthy in her son. There is no history of Esophageal cancer, Rectal cancer, or Stomach cancer. She was adopted.  ROS:   Please see the history of present illness.    All other systems reviewed and are negative.  EKGs/Labs/Other Studies Reviewed:    The following studies were reviewed today:    September 2023 loop interrogation personally reviewed Atrial fibrillation, low burden   July 18, 2019 echo EF 65 to 70% RV  normal Left atrial size normal   Recent Labs: No results found for requested labs within last 365 days.   Recent Lipid Panel    Component Value Date/Time   CHOL 224 (H) 07/18/2019 0601   TRIG 91 07/18/2019 0601   HDL 86 07/18/2019 0601   CHOLHDL 2.6 07/18/2019 0601   VLDL 18 07/18/2019 0601   LDLCALC 120 (H) 07/18/2019 0601    Physical Exam:    VS:  BP 122/64   Pulse 67   Ht '5\' 4"'$  (1.626 m)   Wt 182 lb (82.6 kg)   SpO2 98%   BMI 31.24 kg/m     Wt Readings from Last 3 Encounters:  03/27/22 182 lb (82.6 kg)  03/18/22 182 lb 9.6 oz (82.8 kg)  03/06/22 185 lb 6.4 oz (84.1 kg)     GEN: Well nourished, well developed in no acute distress HEENT: Normal NECK: No JVD; No carotid bruits LYMPHATICS: No lymphadenopathy CARDIAC: RRR, no murmurs, rubs, gallops. Device pocket well healed. RESPIRATORY:  Clear to auscultation without rales, wheezing or rhonchi  ABDOMEN: Soft, non-tender, non-distended MUSCULOSKELETAL:  No edema; No deformity  SKIN: Warm and dry NEUROLOGIC:  Alert and oriented x 3 PSYCHIATRIC:  Normal affect       ASSESSMENT:    1. Atrial fibrillation, unspecified type ([place])   2. Cerebrovascular accident (CVA) due to embolism of left middle cerebral artery (HCC)    PLAN:    In order of problems listed above:  #pAF Low burden. Minimal/no symptoms. Now on eliquis and tolerating. She was referred to discuss Watchman implant. Given the new diagnosis of AF, I would like to have more time to assess the burden of AF before deciding to embark on left atrial appendage occlusion given this would change the needed/recommended interventions (?PVI necessary pre LAAO if AF burden is high/increasing).  Thankfully she is tolerating the Eliquis. For now, continue this medication.  ---------  I have seen Jordan Martinez in the office today who is being considered for a Watchman left atrial appendage closure  device. I believe they will benefit from this procedure given  their history of atrial fibrillation, CHA2DS2-VASc score of 6 and unadjusted ischemic stroke rate of 9.7% per year. The patient's chart has been reviewed and I feel that they would be a candidate for short term oral anticoagulation after Watchman implant.   It is my belief that after undergoing a LAA closure procedure, Jordan Martinez will not need long term anticoagulation which eliminates anticoagulation side effects and major bleeding risk.   Procedural risks for the Watchman implant have been reviewed with the patient including a 0.5% risk of stroke, <1% risk of perforation and <1% risk of device embolization. Other risks include bleeding, vascular damage, tamponade, worsening renal function, and death. The patient understands these risks.   The published clinical data on the safety and effectiveness of WATCHMAN include but are not limited to the following: - Holmes DR, Mechele Claude, Sick P et al. for the PROTECT AF Investigators. Percutaneous closure of the left atrial appendage versus warfarin therapy for prevention of stroke in patients with atrial fibrillation: a randomised non-inferiority trial. Lancet 2009; 374: 534-42. Mechele Claude, Doshi SK, Abelardo Diesel D et al. on behalf of the PROTECT AF Investigators. Percutaneous Left Atrial Appendage Closure for Stroke Prophylaxis in Patients With Atrial Fibrillation 2.3-Year Follow-up of the PROTECT AF (Watchman Left Atrial Appendage System for Embolic Protection in Patients With Atrial Fibrillation) Trial. Circulation 2013; 127:720-729. - Alli O, Doshi S,  Kar S, Reddy VY, Sievert H et al. Quality of Life Assessment in the Randomized PROTECT AF (Percutaneous Closure of the Left Atrial Appendage Versus Warfarin Therapy for Prevention of Stroke in Patients With Atrial Fibrillation) Trial of Patients at Risk for Stroke With Nonvalvular Atrial Fibrillation. J Am Coll Cardiol 2013; 16:0737-1. Vertell Limber DR, Tarri Abernethy, Price M, [place], Sievert H, Doshi S, Huber  K, Reddy V. Prospective randomized evaluation of the Watchman left atrial appendage Device in patients with atrial fibrillation versus long-term warfarin therapy; the PREVAIL trial. Journal of the [employer] of Cardiology, Vol. 4, No. 1, 2014, 1-11. - Kar S, Doshi SK, Sadhu A, Horton R, Osorio J et al. Primary outcome evaluation of a next-generation left atrial appendage closure device: results from the PINNACLE FLX trial. Circulation 2021;143(18)1754-1762.    If Watchman implant is ultimately pursued, plan for CT scan of the chest with contrast timed for PV/LA visualization.     HAS-BLED score 3 Hypertension Yes  Abnormal renal and liver function (Dialysis, transplant, Cr >2.26 mg/dL /Cirrhosis or Bilirubin >2x Normal or AST/ALT/AP >3x Normal) No  Stroke Yes  Bleeding No  Labile INR (Unstable/high INR) No  Elderly (>65) Yes  Drugs or alcohol (? 8 drinks/week, anti-plt or NSAID) No   CHA2DS2-VASc Score = 6  The patient's score is based upon: CHF History: 0 HTN History: 1 Diabetes History: 1 Stroke History: 2 Vascular Disease History: 0 Age Score: 1 Gender Score: 1    #hx of CVA Eliquis as above.  Follow-up in 6 months with Dr. Curt Bears. If LAAO ends up being desired I do think she would be a good candidate for the procedure.     Medication Adjustments/Labs and Tests Ordered: Current medicines are reviewed at length with the patient today.  Concerns regarding medicines are outlined above.   No orders of the defined types were placed in this encounter.  No orders of the defined types were placed in this encounter.   I,Mathew Stumpf,acting as a Education administrator for [employer],  MD.,have documented all relevant documentation on the behalf of Vickie Epley, MD,as directed by  Vickie Epley, MD while in the presence of Vickie Epley, MD.  I, Vickie Epley, MD, have reviewed all documentation for this visit. The documentation on 03/27/22 for the exam,  diagnosis, procedures, and orders are all accurate and complete.   Signed, Hilton Cork. Quentin Ore, MD, [facility], [facility] 03/27/2022 10:02 PM    Electrophysiology [facility]

## 2022-03-27 NOTE — Patient Instructions (Signed)
Medication Instructions:  none *If you need a refill on your cardiac medications before your next appointment, please call your pharmacy*   Lab Work: none If you have labs (blood work) drawn today and your tests are completely normal, you will receive your results only by: [place] (if you have MyChart) OR A paper copy in the mail If you have any lab test that is abnormal or we need to change your treatment, we will call you to review the results.   Testing/Procedures: none   Follow-Up: At [facility], you and your health needs are our priority.  As part of our continuing mission to provide you with exceptional heart care, we have created designated Provider Care Teams.  These Care Teams include your primary Cardiologist (physician) and Advanced Practice Providers (APPs -  Physician Assistants and Nurse Practitioners) who all work together to provide you with the care you need, when you need it.  We recommend signing up for the patient portal called "MyChart".  Sign up information is provided on this After Visit Summary.  MyChart is used to connect with patients for Virtual Visits (Telemedicine).  Patients are able to view lab/test results, encounter notes, upcoming appointments, etc.  Non-urgent messages can be sent to your provider as well.   To learn more about what you can do with MyChart, go to [email or web address].    Your next appointment:   6 month(s)  The format for your next appointment:   In Person  Provider:   Allegra Lai, MD   As needed with Dr. Quentin Ore.  Other Instructions None   Important Information About Sugar

## 2022-03-27 NOTE — Progress Notes (Deleted)
Electrophysiology Office Note:    Date:  03/27/2022   ID:  Jordan Martinez, DOB April 19, 1951, MRN [id number]  PCP:  Jordan Glazier, MD  [facility] Cardiologist:  None  [facility] Electrophysiologist:  Jordan Epley, MD   Referring MD: Jordan Glazier, MD   Chief Complaint: Atrial fibrillation  History of Present Illness:    Jordan Martinez is a 71 y.o. female who presents for an evaluation of atrial fibrillation at the request of Jordan Martinez. 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  She has a loop recorder in place which is only recently shown episodes of atrial fibrillation.  She has been started on Eliquis.     Past Medical History:  Diagnosis Date   Allergy    Anxiety    on meds   Arthritis    on PRN meds   Cataract    bilateral cat. ext. with lens implant- in [place]   Diabetes mellitus without complication ([place])    diet controlled. States always has borderline HgbA1C   Endometrial cancer ([place])    endometrial CA-2019   Family history of colon cancer    daughter 71 colon CA   Family history of genetic mutation for hereditary nonpolyposis colorectal cancer (HNPCC)    GERD (gastroesophageal reflux disease)    on meds   High cholesterol    on meds   History of kidney stones    years    Hypertension    on meds   Neuromuscular disorder ([place])    Neuropathy    Sensation of pressure in bladder area    Stroke ([place]) 07/17/2019   TIA (transient ischemic attack) 07/2021   Uterine cramping     Past Surgical History:  Procedure Laterality Date   burnt the nerves  Right 04/29/2018   in right knee    burnt the nerves  Left 04/22/2018   left knee nerve    COLONOSCOPY  12/2018   [place]- 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   cystoscopy kidney stone   1985   HYSTERECTOMY ABDOMINAL WITH SALPINGO-OOPHORECTOMY  2019   IR CT HEAD LTD  07/18/2019   IR IMAGING  GUIDED PORT INSERTION  06/15/2018   IR PERCUTANEOUS ART THROMBECTOMY/INFUSION INTRACRANIAL INC DIAG ANGIO  07/18/2019   IR REMOVAL TUN ACCESS W/ PORT W/O FL MOD SED  12/01/2018   LOOP RECORDER INSERTION N/A 07/19/2019   Procedure: LOOP RECORDER INSERTION;  Surgeon: Constance Haw, MD;  Location: [place] [facility];  Service: Cardiovascular;  Laterality: N/A;   POLYPECTOMY     RADIOLOGY WITH ANESTHESIA N/A 07/17/2019   Procedure: IR WITH ANESTHESIA;  Surgeon: Luanne Bras, MD;  Location: [place];  Service: Radiology;  Laterality: N/A;   REPLACEMENT TOTAL KNEE BILATERAL Bilateral 2018   Right x 2. 2019 undergoing intermittent injection "RFA" procedures with Dr. Sheliah Mends [facility]   REVISION TOTAL KNEE ARTHROPLASTY Right 03/2017   ROBOTIC ASSISTED TOTAL HYSTERECTOMY WITH BILATERAL SALPINGO OOPHERECTOMY N/A 05/03/2018   Procedure: XI ROBOTIC ASSISTED TOTAL HYSTERECTOMY WITH BILATERAL SALPINGO OOPHORECTOMY;  Surgeon: Everitt Amber, MD;  [facility];  Service: Gynecology;  Laterality: N/A;   SENTINEL NODE BIOPSY N/A 05/03/2018   Procedure: SENTINEL NODE BIOPSY;  Surgeon: Everitt Amber, MD;  [facility];  Service: Gynecology;  Laterality: N/A;   TONSILLECTOMY AND ADENOIDECTOMY     TUBAL LIGATION      Current Medications: No outpatient medications have been  marked as taking for the 03/27/22 encounter (Appointment) with Jordan Epley, MD.     Allergies:   Amlodipine, Compazine [prochlorperazine edisylate], Gabapentin, Lyrica [pregabalin], Prochlorperazine, Sertraline, and Chlorhexidine   Social History   Socioeconomic History   Marital status: Divorced    Spouse name: Not on file   Number of children: 2   Years of education: Not on file   Highest education level: Not on file  Occupational History   Occupation: retired Marine scientist  Tobacco Use   Smoking status: Every Day    Packs/day: 0.25    Years: 40.00    Total pack years: 10.00    Types: Cigarettes   Smokeless  tobacco: Never   Tobacco comments:    4-5 loose cigarettes per day , over 20 year hx of smoking   Vaping Use   Vaping Use: Never used  Substance and Sexual Activity   Alcohol use: Yes    Alcohol/week: 14.0 standard drinks of alcohol    Types: 14 Glasses of wine per week    Comment: about 2 glasses wine daily.    Drug use: Never   Sexual activity: Not Currently    Birth control/protection: None  Other Topics Concern   Not on file  Social History Narrative   Lives alone   Social Determinants of Health   Financial Resource Strain: Not on file  Food Insecurity: Not on file  Transportation Needs: Not on file  Physical Activity: Not on file  Stress: Not on file  Social Connections: Not on file     Family History: The patient's family history includes Colon cancer (age of onset: 31) in her daughter; Colon polyps (age of onset: 28) in her daughter; Healthy in her son. There is no history of Esophageal cancer, Rectal cancer, or Stomach cancer. She was adopted.  ROS:   Please see the history of present illness.    All other systems reviewed and are negative.  EKGs/Labs/Other Studies Reviewed:    The following studies were reviewed today:  September 2023 loop interrogation personally reviewed Atrial fibrillation, low burden  July 18, 2019 echo EF 65 to 70% RV normal Left atrial size normal     Recent Labs: No results found for requested labs within last 365 days.  Recent Lipid Panel    Component Value Date/Time   CHOL 224 (H) 07/18/2019 0601   TRIG 91 07/18/2019 0601   HDL 86 07/18/2019 0601   CHOLHDL 2.6 07/18/2019 0601   VLDL 18 07/18/2019 0601   LDLCALC 120 (H) 07/18/2019 0601    Physical Exam:    VS:  There were no vitals taken for this visit.    Wt Readings from Last 3 Encounters:  03/18/22 182 lb 9.6 oz (82.8 kg)  03/06/22 185 lb 6.4 oz (84.1 kg)  11/13/21 186 lb 12.8 oz (84.7 kg)     GEN: *** Well nourished, well developed in no acute  distress HEENT: Normal NECK: No JVD; No carotid bruits LYMPHATICS: No lymphadenopathy CARDIAC: ***RRR, no murmurs, rubs, gallops RESPIRATORY:  Clear to auscultation without rales, wheezing or rhonchi  ABDOMEN: Soft, non-tender, non-distended MUSCULOSKELETAL:  No edema; No deformity  SKIN: Warm and dry NEUROLOGIC:  Alert and oriented x 3 PSYCHIATRIC:  Normal affect       ASSESSMENT:    1. Atrial fibrillation, unspecified type ([place])   2. Cerebrovascular accident (CVA) due to embolism of left middle cerebral artery (HCC)    PLAN:    In order of problems listed above:  Total time spent with patient today *** minutes. This includes reviewing records, evaluating the patient and coordinating care.  Medication Adjustments/Labs and Tests Ordered: Current medicines are reviewed at length with the patient today.  Concerns regarding medicines are outlined above.  No orders of the defined types were placed in this encounter.  No orders of the defined types were placed in this encounter.    Signed, Hilton Cork. Quentin Ore, MD, [facility], [facility] 03/27/2022 6:21 AM    Electrophysiology [facility]

## 2022-03-30 ENCOUNTER — Ambulatory Visit (INDEPENDENT_AMBULATORY_CARE_PROVIDER_SITE_OTHER): Payer: Medicare Other

## 2022-03-30 DIAGNOSIS — I4891 Unspecified atrial fibrillation: Secondary | ICD-10-CM

## 2022-03-31 ENCOUNTER — Encounter: Payer: Self-pay | Admitting: Physician Assistant

## 2022-03-31 ENCOUNTER — Telehealth: Payer: Self-pay

## 2022-03-31 ENCOUNTER — Ambulatory Visit (INDEPENDENT_AMBULATORY_CARE_PROVIDER_SITE_OTHER): Payer: Medicare Other | Admitting: Physician Assistant

## 2022-03-31 VITALS — BP 120/70 | HR 80 | Ht 64.0 in | Wt 182.0 lb

## 2022-03-31 DIAGNOSIS — Z800 Family history of malignant neoplasm of digestive organs: Secondary | ICD-10-CM | POA: Diagnosis not present

## 2022-03-31 DIAGNOSIS — Z860100 Personal history of colon polyps, unspecified: Secondary | ICD-10-CM

## 2022-03-31 DIAGNOSIS — Z1509 Genetic susceptibility to other malignant neoplasm: Secondary | ICD-10-CM

## 2022-03-31 DIAGNOSIS — I639 Cerebral infarction, unspecified: Secondary | ICD-10-CM

## 2022-03-31 DIAGNOSIS — Z7901 Long term (current) use of anticoagulants: Secondary | ICD-10-CM

## 2022-03-31 NOTE — Telephone Encounter (Signed)
[facility] Pre-operative Risk Assessment     Request for surgical clearance:     Endoscopy Procedure  What type of surgery is being performed?     Colonoscopy  When is this surgery scheduled?     04-29-2022  What type of clearance is required ?   Pharmacy  Are there any medications that need to be held prior to surgery and how long? Yes, Eliquis 2 days  Practice name and name of physician performing surgery?      [facility]  What is your office phone and fax number?      Phone- [phone]  [phone]  Anesthesia type (None, local, MAC, general) ?       MAC

## 2022-03-31 NOTE — Progress Notes (Signed)
Chief Complaint: History of Lynch syndrome, chronic anticoagulation  HPI:    [name] is a 71 year old female with a past medical history as listed below including newly diagnosed A-fib on Eliquis, stroke x2 (last in January 2023), a family history of colon cancer in her daughter diagnosed at the age of 31 and Lynch syndrome, known to Dr. Tarri Glenn, who presents to clinic today to discuss a surveillance colonoscopy.      03/31/2021 colonoscopy for history of Lynch syndrome and family history of colon cancer as well as personal history of polyps with diverticulosis in the sigmoid and distal descending colon, multiple diminutive polyps in the rectum, one 2 mm polyp in the sigmoid colon, 1 less than one 1 mm polyp in the proximal ascending colon and otherwise normal.  Repeat recommended in 1 year for surveillance.    03/18/2022 patient diagnosed with A-fib and changed from Plavix to Eliquis.    03/27/2022 patient followed with the A-fib clinic and was doing well.    Today, patient tells me that a couple of years ago she had a stroke and had a loop recorder placed because they were not exactly sure why she had a stroke, she had never had any problems until a couple of weeks ago when it found A-fib.  Since then her Plavix was changed to Eliquis and they are keeping a closer eye on her.  She feels well.    Discusses history of Lynch syndrome and colon cancer in her daughter as well as Lynch syndrome and one of her granddaughters.    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    Denies fever, chills, weight loss, blood in her stool, change in bowel habits or abdominal pain.  Past Medical History:  Diagnosis Date   Allergy    Anxiety    on meds   Arthritis    on PRN meds   Cataract    bilateral cat. ext. with lens implant- in [place]   Diabetes mellitus without complication ([place])    diet controlled. States always has borderline HgbA1C   Endometrial cancer ([place])    endometrial CA-2019    Family history of colon cancer    daughter 24 colon CA   Family history of genetic mutation for hereditary nonpolyposis colorectal cancer ([facility])    GERD (gastroesophageal reflux disease)    on meds   High cholesterol    on meds   History of kidney stones    years    Hypertension    on meds   Neuromuscular disorder ([place])    Neuropathy    Sensation of pressure in bladder area    Stroke ([place]) 07/17/2019   TIA (transient ischemic attack) 07/2021   Uterine cramping     Past Surgical History:  Procedure Laterality Date   burnt the nerves  Right 04/29/2018   in right knee    burnt the nerves  Left 04/22/2018   left knee nerve    COLONOSCOPY  12/2018   [place]- 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   cystoscopy kidney stone   1985   HYSTERECTOMY ABDOMINAL WITH SALPINGO-OOPHORECTOMY  2019   IR CT HEAD LTD  07/18/2019   IR IMAGING GUIDED PORT INSERTION  06/15/2018   IR PERCUTANEOUS ART THROMBECTOMY/INFUSION INTRACRANIAL INC DIAG ANGIO  07/18/2019   IR REMOVAL TUN ACCESS W/ PORT W/O FL MOD SED  12/01/2018   LOOP RECORDER INSERTION N/A 07/19/2019   Procedure: LOOP RECORDER INSERTION;  Surgeon: Constance Haw,  MD;  Location: [facility];  Service: Cardiovascular;  Laterality: N/A;   POLYPECTOMY     RADIOLOGY WITH ANESTHESIA N/A 07/17/2019   Procedure: IR WITH ANESTHESIA;  Surgeon: Luanne Bras, MD;  Location: [facility];  Service: Radiology;  Laterality: N/A;   REPLACEMENT TOTAL KNEE BILATERAL Bilateral 2018   Right x 2. 2019 undergoing intermittent injection "RFA" procedures with Dr. Sheliah Mends [facility]   REVISION TOTAL KNEE ARTHROPLASTY Right 03/2017   ROBOTIC ASSISTED TOTAL HYSTERECTOMY WITH BILATERAL SALPINGO OOPHERECTOMY N/A 05/03/2018   Procedure: XI ROBOTIC ASSISTED TOTAL HYSTERECTOMY WITH BILATERAL SALPINGO OOPHORECTOMY;  Surgeon: Everitt Amber, MD;  Location: [facility];  Service: Gynecology;  Laterality: N/A;   SENTINEL NODE BIOPSY N/A 05/03/2018    Procedure: SENTINEL NODE BIOPSY;  Surgeon: Everitt Amber, MD;  Location: [facility];  Service: Gynecology;  Laterality: N/A;   TONSILLECTOMY AND ADENOIDECTOMY     TUBAL LIGATION      Current Outpatient Medications  Medication Sig Dispense Refill   acetaminophen (TYLENOL) 500 MG tablet Take 1,000 mg by mouth once as needed for mild pain or moderate pain. Extra strength     amitriptyline (ELAVIL) 10 MG tablet Take 1 tablet (10 mg total) by mouth at bedtime. (Patient taking differently: Take 10 mg by mouth daily after supper.) 90 tablet 3   amitriptyline (ELAVIL) 25 MG tablet Take 1 tablet (25 mg total) by mouth at bedtime. 90 tablet 3   amoxicillin (AMOXIL) 500 MG capsule Take 2,000 mg by mouth once as needed (dental work).     apixaban (ELIQUIS) 5 MG TABS tablet Take 1 tablet (5 mg total) by mouth 2 (two) times daily. 60 tablet 3   atenolol (TENORMIN) 50 MG tablet Take 75 mg by mouth daily.     atorvastatin (LIPITOR) 80 MG tablet Take 80 mg by mouth daily.     Biotin 5000 MCG TABS Take 5,000 mcg by mouth daily.     Cholecalciferol (VITAMIN D3) 50 MCG (2000 UT) TABS Take 1 tablet by mouth daily in the afternoon.     clotrimazole (LOTRIMIN) 1 % cream Apply topically 2 (two) times daily.     Coenzyme Q10 (CO Q 10 PO) Take 200 mg by mouth daily.     cyclobenzaprine (FLEXERIL) 5 MG tablet Take 5 mg by mouth 3 (three) times daily as needed for muscle spasms.     hydrocortisone 2.5 % cream Apply 1 Application topically 2 (two) times daily.     Lancets (ONETOUCH DELICA PLUS [id number]) MISC Apply topically daily.     lisinopril (ZESTRIL) 10 MG tablet Take 10 mg by mouth daily.     metFORMIN (GLUCOPHAGE) 500 MG tablet TAKE 1 TABLET BY MOUTH TWICE A DAY WITH MEALS 60 tablet 1   Omega-3 Fatty Acids (FISH OIL) 1200 MG CAPS Take 1,200 mg by mouth daily.      omeprazole (PRILOSEC) 40 MG capsule Take 1 capsule (40 mg total) by mouth daily. 90 capsule 3   ONETOUCH ULTRA test strip USE TO CHECK BLOOD SUGAR ONCE  DAILY E11.69     Propylene Glycol 0.6 % SOLN Place 2 drops into both eyes 4 (four) times daily.     SACCHAROMYCES BOULARDII PO Liquid form-     senna-docusate (SENOKOT-S) 8.6-50 MG tablet Take 1 tablet by mouth as needed.     No current facility-administered medications for this visit.    Allergies as of 03/31/2022 - Review Complete 03/31/2022  Allergen Reaction Noted   Amlodipine  03/31/2018  Compazine [prochlorperazine edisylate]  03/31/2018   Gabapentin  03/31/2018   Lyrica [pregabalin]  03/31/2018   Prochlorperazine Other (See Comments) 01/11/2018   Sertraline Other (See Comments) 04/12/2018   Chlorhexidine Rash 08/01/2018    Family History  Adopted: Yes  Problem Relation Age of Onset   Colon cancer Daughter 74       Lynch syndrome - MSH6 +   Colon polyps Daughter 78   Healthy Son    Esophageal cancer Neg Hx    Rectal cancer Neg Hx    Stomach cancer Neg Hx     Social History   Socioeconomic History   Marital status: Divorced    Spouse name: Not on file   Number of children: 2   Years of education: Not on file   Highest education level: Not on file  Occupational History   Occupation: retired Marine scientist  Tobacco Use   Smoking status: Every Day    Packs/day: 0.25    Years: 40.00    Total pack years: 10.00    Types: Cigarettes   Smokeless tobacco: Never   Tobacco comments:    4-5 loose cigarettes per day , over 20 year hx of smoking   Vaping Use   Vaping Use: Never used  Substance and Sexual Activity   Alcohol use: Yes    Alcohol/week: 14.0 standard drinks of alcohol    Types: 14 Glasses of wine per week    Comment: about 2 glasses wine daily.    Drug use: Never   Sexual activity: Not Currently    Birth control/protection: None  Other Topics Concern   Not on file  Social History Narrative   Lives alone   Social Determinants of Health   Financial Resource Strain: Not on file  Food Insecurity: Not on file  Transportation Needs: Not on file  Physical  Activity: Not on file  Stress: Not on file  Social Connections: Not on file  Intimate Partner Violence: Not on file    Review of Systems:    Constitutional: No weight loss, fever or chills Cardiovascular: No chest pain   Respiratory: No SOB Gastrointestinal: See HPI and otherwise negative   Physical Exam:  Vital signs: BP 120/70   Pulse 80   Ht _0  (1.626 m)   Wt 182 lb (82.6 kg)   BMI 31.24 kg/m    Constitutional:   Pleasant overweight Caucasian female appears to be in NAD, Well developed, Well nourished, alert and cooperative Respiratory: Respirations even and unlabored. Lungs clear to auscultation bilaterally.   No wheezes, crackles, or rhonchi.  Cardiovascular: Normal S1, S2. No MRG. Regular rate and rhythm. No peripheral edema, cyanosis or pallor.  Gastrointestinal:  Soft, nondistended, nontender. No rebound or guarding. Normal bowel sounds. No appreciable masses or hepatomegaly. Rectal:  Not performed.  Psychiatric: Oriented to person, place and time. Demonstrates good judgement and reason without abnormal affect or behaviors.  No recent labs.  Assessment: 1.  History of Lynch syndrome: Last colonoscopy in September 2022 with recommendations to repeat in a year 2.  Family history of colon cancer: In her daughter at the age of 1 3.  Personal history of adenomatous polyps 4.  Chronic anticoagulation for A-fib: History of stroke, now on Eliquis  Plan: 1.  Discussed with patient that since she was recently switched to Eliquis that may delay her colonoscopy which is currently scheduled 04/29/2022.  We will communicate with her prescribing physician to ensure that holding her Eliquis for 2 days is appropriate  for her.  Explained that once we hear back from the cardiologist we will let her know.  We may have to delay her procedure. 2.  Patient to follow in clinic per recommendations after above.  Ellouise Newer, PA-C [facility] 03/31/2022, 2:30 PM  Cc:  Javier Glazier, MD

## 2022-03-31 NOTE — Patient Instructions (Signed)
_______________________________________________________  If you are age 71 or older, your body mass index should be between 23-30. Your Body mass index is 31.24 kg/m. If this is out of the aforementioned range listed, please consider follow up with your Primary Care Provider.  If you are age 35 or younger, your body mass index should be between 19-25. Your Body mass index is 31.24 kg/m. If this is out of the aformentioned range listed, please consider follow up with your Primary Care Provider.   ________________________________________________________  The [facility] providers would like to encourage you to use [facility] to communicate with providers for non-urgent requests or questions.  Due to long hold times on the telephone, sending your provider a message by [facility] may be a faster and more efficient way to get a response.  Please allow 48 business hours for a response.  Please remember that this is for non-urgent requests.  _______________________________________________________  We will discuss your blood thinners with Cardiology. We will be in touch soon.  It was a pleasure to see you today!  Thank you for trusting me with your gastrointestinal care!

## 2022-04-02 NOTE — Telephone Encounter (Signed)
   Patient Name: Jordan Martinez  DOB: 08-Dec-1950 MRN: [id number]  Primary Cardiologist: None  Clinical pharmacists have reviewed the patient's past medical history, labs, and current medications as part of preoperative protocol coverage. The following recommendations have been made:  Patient with diagnosis of A Fib on Eliquis for anticoagulation.     Procedure: colonoscopy Date of procedure: 04/29/22     CHA2DS2-VASc Score = 6  This indicates a 9.7% annual risk of stroke. The patient's score is based upon: CHF History: 0 HTN History: 1 Diabetes History: 1 Stroke History: 2 Vascular Disease History: 0 Age Score: 1 Gender Score: 1     CrCl 91 mL/min Platelet count 293K   Patient with history of multiple strokes and cancer. Recommend holding Eliquis for 1 day. If  a 2 day hold is required, will need cardiologist approval.    I will route this recommendation to the requesting party via Epic fax function and remove from pre-op pool.  Please call with questions.  Lenna Sciara, NP 04/02/2022, 3:22 PM

## 2022-04-02 NOTE — Telephone Encounter (Signed)
Patient with diagnosis of A Fib on Eliquis for anticoagulation.    Procedure: colonoscopy Date of procedure: 04/29/22   CHA2DS2-VASc Score = 6  This indicates a 9.7% annual risk of stroke. The patient's score is based upon: CHF History: 0 HTN History: 1 Diabetes History: 1 Stroke History: 2 Vascular Disease History: 0 Age Score: 1 Gender Score: 1    CrCl 91 mL/min Platelet count 293K  Patient with history of multiple strokes and cancer. Recommend holding Eliquis for 1 day. If gastro requires a 2 day hold, will need cardiologist approval.   **This guidance is not considered finalized until pre-operative APP has relayed final recommendations.**

## 2022-04-03 NOTE — Telephone Encounter (Signed)
Anderson Malta please see below

## 2022-04-03 NOTE — Progress Notes (Signed)
Reviewed and agree with management plans. She is due colonoscopy. Last EGD 2020 and surveillance EGD should be planned sometime in the next 12-24 months.    Ludean Duhart L. Tarri Glenn, MD, MPH

## 2022-04-08 MED ORDER — NA SULFATE-K SULFATE-MG SULF 17.5-3.13-1.6 GM/177ML PO SOLN: 1.0000 | ORAL | 0 refills | Status: DC

## 2022-04-08 NOTE — Telephone Encounter (Signed)
Called and spoke with patient regarding Eliquis hold. Pt already has a colonoscopy scheduled on 04/29/22 at 10:30 am with Dr. Tarri Glenn in the [facility]. Pt has been advised to hold Eliquis on 04/28/22. Pt is aware that I have sent prep to her pharmacy on file. Pt confirms that she has access to [facility]. Pt knows to expect her instructions via MyChart and I will also mail a copy to her home. Pt verbalized understanding and had no concerns at the end of the call.  Ambulatory referral to GI in epic. Colonoscopy instructions sent to patient via MyChart and a copy has been mailed. SUPREP sent to pharmacy on file.

## 2022-04-08 NOTE — Addendum Note (Signed)
Addended by: Yevette Edwards on: 04/08/2022 10:47 AM   Modules accepted: Orders

## 2022-04-09 ENCOUNTER — Ambulatory Visit (HOSPITAL_COMMUNITY): Payer: Medicare Other | Admitting: Nurse Practitioner

## 2022-04-13 DIAGNOSIS — G894 Chronic pain syndrome: Secondary | ICD-10-CM | POA: Insufficient documentation

## 2022-04-16 ENCOUNTER — Encounter (HOSPITAL_COMMUNITY): Payer: Self-pay | Admitting: Nurse Practitioner

## 2022-04-16 ENCOUNTER — Ambulatory Visit (HOSPITAL_COMMUNITY)
Admission: RE | Admit: 2022-04-16 | Discharge: 2022-04-16 | Disposition: A | Discharge status: Home / Self Care | Payer: Medicare Other | Source: Ambulatory Visit | Attending: Nurse Practitioner | Admitting: Nurse Practitioner

## 2022-04-16 VITALS — BP 130/64 | HR 81 | Ht 64.0 in | Wt 181.4 lb

## 2022-04-16 DIAGNOSIS — I4891 Unspecified atrial fibrillation: Secondary | ICD-10-CM | POA: Diagnosis not present

## 2022-04-16 DIAGNOSIS — Z7984 Long term (current) use of oral hypoglycemic drugs: Secondary | ICD-10-CM | POA: Diagnosis not present

## 2022-04-16 DIAGNOSIS — Z7901 Long term (current) use of anticoagulants: Secondary | ICD-10-CM | POA: Insufficient documentation

## 2022-04-16 DIAGNOSIS — E119 Type 2 diabetes mellitus without complications: Secondary | ICD-10-CM | POA: Diagnosis not present

## 2022-04-16 DIAGNOSIS — Z79899 Other long term (current) drug therapy: Secondary | ICD-10-CM | POA: Diagnosis not present

## 2022-04-16 DIAGNOSIS — D6869 Other thrombophilia: Secondary | ICD-10-CM | POA: Diagnosis not present

## 2022-04-16 DIAGNOSIS — Z8673 Personal history of transient ischemic attack (TIA), and cerebral infarction without residual deficits: Secondary | ICD-10-CM | POA: Insufficient documentation

## 2022-04-16 LAB — BASIC METABOLIC PANEL
Anion gap: 10 (ref 5–15)
BUN: 16 mg/dL (ref 8–23)
CO2: 23 mmol/L (ref 22–32)
Calcium: 9.2 mg/dL (ref 8.9–10.3)
Chloride: 106 mmol/L (ref 98–111)
Creatinine, Ser: 0.65 mg/dL (ref 0.44–1.00)
GFR, Estimated: >60 mL/min (ref >60)
Glucose, Bld: 127 mg/dL — ABNORMAL HIGH (ref 70–99)
Potassium: 4.6 mmol/L (ref 3.5–5.1)
Sodium: 139 mmol/L (ref 135–145)

## 2022-04-16 LAB — CBC
HCT: 34.6 % — ABNORMAL LOW (ref 36.0–46.0)
Hemoglobin: 10.3 g/dL — ABNORMAL LOW (ref 12.0–15.0)
MCH: 23.7 pg — ABNORMAL LOW (ref 26.0–34.0)
MCHC: 29.8 g/dL — ABNORMAL LOW (ref 30.0–36.0)
MCV: 79.7 fL — ABNORMAL LOW (ref 80.0–100.0)
Platelets: 271 10*3/uL (ref 150–400)
RBC: 4.34 MIL/uL (ref 3.87–5.11)
RDW: 18.6 % — ABNORMAL HIGH (ref 11.5–15.5)
WBC: 7.9 10*3/uL (ref 4.0–10.5)
nRBC: 0 % (ref 0.0–0.2)

## 2022-04-16 NOTE — Progress Notes (Signed)
Primary Care Physician: Javier Glazier, MD Referring Physician: Device clinic EP: Dr. 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 She had a linq implanted after the CVA in 2021, and just recently showed afib, 5 episodes, all on 9/10, with longest episode lasting 1 hour. Pt does not know any reason or trigger occuring on the 10 th.   She was referred to afib clinic, 9/13, for new onset afib for change of anticoagulation with h/o prior stroke.  She has been on Plavix since her stroke in January of this year, prior to that asa daily. She has tolerated Plavix without issues with bleeding.   In the clinic today, she is in [place]. We discussed changing  anticoagulation to be more specific to prevent stroke. I discussed with Frann Rider, NP, who follows pt in neurology, and she has no issues to stop plavix and start eliquis 5 mg bid. I discussed this drug, risk vrs benefit with pt and significant other, and she is in agreement to change.   F/u in afib clinic, 04/16/22. She  is doing well on eliquis, has not noted a change with use of eliquis. Linq reviewed and no afib noted.   Today, she denies symptoms of palpitations, chest pain, shortness of breath, orthopnea, PND, lower extremity edema, dizziness, presyncope, syncope, or neurologic sequela. The patient is tolerating medications without difficulties and is otherwise without complaint today.   Past Medical History:  Diagnosis Date   Allergy    Anxiety    on meds   Arthritis    on PRN meds   Cataract    bilateral cat. ext. with lens implant- in [place]   Diabetes mellitus without complication ([place])    diet controlled. States always has borderline HgbA1C   Endometrial cancer ([place])    endometrial CA-2019   Family history of colon cancer     daughter 46 colon CA   Family history of genetic mutation for hereditary nonpolyposis colorectal cancer ([facility])    GERD (gastroesophageal reflux disease)    on meds   High cholesterol    on meds   History of kidney stones    years    Hypertension    on meds   Neuromuscular disorder ([place])    Neuropathy    Sensation of pressure in bladder area    Stroke ([place]) 07/17/2019   TIA (transient ischemic attack) 07/2021   Uterine cramping    Past Surgical History:  Procedure Laterality Date   burnt the nerves  Right 04/29/2018   in right knee    burnt the nerves  Left 04/22/2018   left knee nerve    COLONOSCOPY  12/2018   [place]- 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   cystoscopy kidney stone   1985   HYSTERECTOMY ABDOMINAL WITH SALPINGO-OOPHORECTOMY  2019   IR CT HEAD LTD  07/18/2019   IR IMAGING GUIDED PORT INSERTION  06/15/2018   IR PERCUTANEOUS ART THROMBECTOMY/INFUSION INTRACRANIAL INC DIAG ANGIO  07/18/2019   IR REMOVAL TUN ACCESS W/ PORT W/O FL MOD SED  12/01/2018   LOOP RECORDER INSERTION N/A 07/19/2019   Procedure: LOOP RECORDER INSERTION;  Surgeon: Constance Haw, MD;  Location: [facility];  Service: Cardiovascular;  Laterality: N/A;   POLYPECTOMY     RADIOLOGY WITH ANESTHESIA  N/A 07/17/2019   Procedure: IR WITH ANESTHESIA;  Surgeon: Luanne Bras, MD;  Location: [facility];  Service: Radiology;  Laterality: N/A;   REPLACEMENT TOTAL KNEE BILATERAL Bilateral 2018   Right x 2. 2019 undergoing intermittent injection "RFA" procedures with Dr. Sheliah Mends [facility]   REVISION TOTAL KNEE ARTHROPLASTY Right 03/2017   ROBOTIC ASSISTED TOTAL HYSTERECTOMY WITH BILATERAL SALPINGO OOPHERECTOMY N/A 05/03/2018   Procedure: XI ROBOTIC ASSISTED TOTAL HYSTERECTOMY WITH BILATERAL SALPINGO OOPHORECTOMY;  Surgeon: Everitt Amber, MD;  Location: [facility];  Service: Gynecology;  Laterality: N/A;   SENTINEL NODE BIOPSY N/A 05/03/2018   Procedure: SENTINEL NODE BIOPSY;  Surgeon:  Everitt Amber, MD;  Location: [facility];  Service: Gynecology;  Laterality: N/A;   TONSILLECTOMY AND ADENOIDECTOMY     TUBAL LIGATION      Current Outpatient Medications  Medication Sig Dispense Refill   acetaminophen (TYLENOL) 500 MG tablet Take 1,000 mg by mouth once as needed for mild pain or moderate pain. Extra strength     amitriptyline (ELAVIL) 10 MG tablet Take 1 tablet (10 mg total) by mouth at bedtime. (Patient taking differently: Take 10 mg by mouth daily after supper.) 90 tablet 3   amitriptyline (ELAVIL) 25 MG tablet Take 1 tablet (25 mg total) by mouth at bedtime. 90 tablet 3   amoxicillin (AMOXIL) 500 MG capsule Take 2,000 mg by mouth once as needed (dental work).     apixaban (ELIQUIS) 5 MG TABS tablet Take 1 tablet (5 mg total) by mouth 2 (two) times daily. 60 tablet 3   atenolol (TENORMIN) 50 MG tablet Take 75 mg by mouth daily.     atorvastatin (LIPITOR) 80 MG tablet Take 80 mg by mouth daily.     Biotin 5000 MCG TABS Take 5,000 mcg by mouth daily.     Cholecalciferol (VITAMIN D3) 50 MCG (2000 UT) TABS Take 1 tablet by mouth daily in the afternoon.     clotrimazole (LOTRIMIN) 1 % cream Apply topically 2 (two) times daily.     Coenzyme Q10 (CO Q 10 PO) Take 200 mg by mouth daily.     cyclobenzaprine (FLEXERIL) 5 MG tablet Take 5 mg by mouth 3 (three) times daily as needed for muscle spasms.     hydrocortisone 2.5 % cream Apply 1 Application topically 2 (two) times daily.     Lancets (ONETOUCH DELICA PLUS [id number]) MISC Apply topically daily.     lisinopril (ZESTRIL) 10 MG tablet Take 10 mg by mouth daily.     metFORMIN (GLUCOPHAGE) 500 MG tablet TAKE 1 TABLET BY MOUTH TWICE A DAY WITH MEALS 60 tablet 1   Na Sulfate-K Sulfate-Mg Sulf 17.5-3.13-1.6 GM/177ML SOLN Take 1 kit by mouth as directed. 354 mL 0   Omega-3 Fatty Acids (FISH OIL) 1200 MG CAPS Take 1,200 mg by mouth daily.      omeprazole (PRILOSEC) 40 MG capsule Take 1 capsule (40 mg total) by mouth daily. 90 capsule 3    ONETOUCH ULTRA test strip USE TO CHECK BLOOD SUGAR ONCE DAILY E11.69     Propylene Glycol 0.6 % SOLN Place 2 drops into both eyes 4 (four) times daily.     senna-docusate (SENOKOT-S) 8.6-50 MG tablet Take 1 tablet by mouth as needed.     No current facility-administered medications for this encounter.    Allergies  Allergen Reactions   Amlodipine     Blurred vision    Compazine [Prochlorperazine Edisylate]     Stroke symptoms,   Gabapentin  Blurred vision and nystagmus   Lyrica [Pregabalin]     Blurred Vision    Prochlorperazine Other (See Comments)    Could not tolerate   Sertraline Other (See Comments)    Visual disturbances.    Chlorhexidine Rash    Blisters and redness to chest    Social History   Socioeconomic History   Marital status: Divorced    Spouse name: Not on file   Number of children: 2   Years of education: Not on file   Highest education level: Not on file  Occupational History   Occupation: retired Marine scientist  Tobacco Use   Smoking status: Every Day    Packs/day: 0.25    Years: 40.00    Total pack years: 10.00    Types: Cigarettes   Smokeless tobacco: Never   Tobacco comments:    4-5 loose cigarettes per day , over 20 year hx of smoking   Vaping Use   Vaping Use: Never used  Substance and Sexual Activity   Alcohol use: Yes    Alcohol/week: 14.0 standard drinks of alcohol    Types: 14 Glasses of wine per week    Comment: about 2 glasses wine daily.    Drug use: Never   Sexual activity: Not Currently    Birth control/protection: None  Other Topics Concern   Not on file  Social History Narrative   Lives alone   Social Determinants of Health   Financial Resource Strain: Not on file  Food Insecurity: Not on file  Transportation Needs: Not on file  Physical Activity: Not on file  Stress: Not on file  Social Connections: Not on file  Intimate Partner Violence: Not on file    Family History  Adopted: Yes  Problem Relation Age of Onset    Colon cancer Daughter 62       Lynch syndrome - MSH6 +   Colon polyps Daughter 40   Healthy Son    Esophageal cancer Neg Hx    Rectal cancer Neg Hx    Stomach cancer Neg Hx     ROS- All systems are reviewed and negative except as per the HPI above  Physical Exam: Vitals:   04/16/22 1454  BP: 130/64  Pulse: 81  Weight: 82.3 kg  Height: _0  (1.626 m)   Wt Readings from Last 3 Encounters:  04/16/22 82.3 kg  03/31/22 82.6 kg  03/27/22 82.6 kg    Labs: Lab Results  Component Value Date   NA 141 07/18/2019   K 4.1 07/18/2019   CL 110 07/18/2019   CO2 23 07/18/2019   GLUCOSE 204 (H) 07/18/2019   BUN 15 07/18/2019   CREATININE 0.71 07/18/2019   CALCIUM 8.3 (L) 07/18/2019   MG 2.4 06/26/2018   Lab Results  Component Value Date   INR 0.9 07/17/2019   Lab Results  Component Value Date   CHOL 224 (H) 07/18/2019   HDL 86 07/18/2019   LDLCALC 120 (H) 07/18/2019   TRIG 91 07/18/2019     GEN- The patient is well appearing, alert and oriented x 3 today.   Head- normocephalic, atraumatic Eyes-  Sclera clear, conjunctiva pink Ears- hearing intact Oropharynx- clear Neck- supple, no JVP Lymph- no cervical lymphadenopathy Lungs- Clear to ausculation bilaterally, normal work of breathing Heart- Regular rate and rhythm, no murmurs, rubs or gallops, PMI not laterally displaced GI- soft, NT, ND, + BS Extremities- no clubbing, cyanosis, or edema MS- no significant deformity or atrophy Skin- no rash or  lesion Psych- euthymic mood, full affect Neuro- strength and sensation are intact  EKG-Vent. rate 65 BPM PR interval 194 ms QRS duration 90 ms QT/QTcB 414/430 ms P-R-T axes 33 25 19 Normal sinus rhythm RSR' or QR pattern in V1 suggests right ventricular conduction delay Borderline ECG When compared with ECG of 26-Jun-2018 07:15, PREVIOUS ECG IS PRESENT  LINQ alert received.  7 AF events, all from 9/10, longest duration 1hr 46mn, mean HR's in the 90's No OAC - route  to triage.  Echo-1. Left ventricular ejection fraction, by visual estimation, is 65 to 70%. The left ventricle has hyperdynamic function. There is mildly increased left ventricular hypertrophy. 2. Left ventricular diastolic parameters are consistent with Grade I diastolic dysfunction (impaired relaxation). 3. The left ventricle has no regional wall motion abnormalities. 4. Global right ventricle has hyperdynamic systolic function.The right ventricular size is normal. No increase in right ventricular wall thickness. 5. Left atrial size was normal. 6. Right atrial size was normal. 7. Mild mitral annular calcification. 8. The mitral valve is normal in structure. No evidence of mitral valve regurgitation. No evidence of mitral stenosis. 9. The tricuspid valve is normal in structure. 10. The aortic valve is normal in structure. Aortic valve regurgitation is not visualized. Mild aortic valve sclerosis without stenosis. 11. The pulmonic valve was normal in structure. Pulmonic valve regurgitation is not visualized. 12. The inferior vena cava is normal in size with greater than 50% respiratory variability, suggesting right atrial pressure of 3 mmHg.    Assessment and Plan:  1. H/o CVA with new afib seen on Linq 03/15/22 (implanted in 2021) No recent afib noted CHA2DS2VASc  score of at least 5 Continue on eliquis 5 mg bid  She is already on atenolol 75 mg daily with controlled v rates while in afib, so did not change beta blocker at this time She was seen by Dr. LQuentin Orefor possible Watchman, he did not feel appropriate at this time  bmet/cbc today   Continue monitoring with Linq thru Dr. CElinor Parkinsonclinic  Afib clinic as needed   DGeroge Baseman Yazir Koerber, AHighlandville Hospital[address]GSt. David Hermitage [phone]

## 2022-04-17 NOTE — Progress Notes (Signed)
Carelink Summary Report / Loop Recorder 

## 2022-04-21 ENCOUNTER — Ambulatory Visit (HOSPITAL_COMMUNITY): Payer: Medicare Other | Admitting: Nurse Practitioner

## 2022-04-25 ENCOUNTER — Telehealth: Payer: Self-pay | Admitting: Gastroenterology

## 2022-04-25 NOTE — Telephone Encounter (Signed)
Patient fell last evening after tripping over the curb and has several injuries including a foot fracture so she will need to cancel and reschedule her colonoscopy that is currently on 10/25.  Please contact her to reschedule.

## 2022-04-27 NOTE — Telephone Encounter (Signed)
*  Correction 10:30 am.

## 2022-04-27 NOTE — Telephone Encounter (Signed)
Patient has been rescheduled for 06/11/22 at 10:00 am with Dr. Tarri Glenn in [facility]. Updated copy of instructions sent to mychart.

## 2022-04-28 DIAGNOSIS — S92355A Nondisplaced fracture of fifth metatarsal bone, left foot, initial encounter for closed fracture: Secondary | ICD-10-CM | POA: Insufficient documentation

## 2022-04-29 ENCOUNTER — Encounter: Payer: Medicare Other | Admitting: Gastroenterology

## 2022-05-04 ENCOUNTER — Ambulatory Visit: Payer: Medicare Other | Attending: Cardiology

## 2022-05-04 DIAGNOSIS — I4891 Unspecified atrial fibrillation: Secondary | ICD-10-CM | POA: Diagnosis not present

## 2022-05-05 LAB — CUP PACEART REMOTE DEVICE CHECK
Date Time Interrogation Session: 20231029231347
Implantable Pulse Generator Implant Date: 20210113

## 2022-05-12 ENCOUNTER — Encounter: Payer: Medicare Other | Admitting: Gastroenterology

## 2022-05-19 NOTE — Progress Notes (Signed)
[facility] [address] [place]. [place] [address] [phone]       STROKE FOLLOW UP NOTE  [name] Date of Birth:  1951-04-29 Medical Record Number:  [id number]   Reason for Referral: Stroke follow-up    CHIEF COMPLAINT:  Chief Complaint  Patient presents with   Follow-up    Pt alone, rm 2. Stopped plavix and was started on eliquis. She was diagnosed with afib since her visit here in May. She is interested in amitriptyline taking both doses at bedtime       HPI:   Update 05/20/2022 JM: Patient returns for 33-month stroke follow-up.  Overall stable without new stroke/TIA symptoms.  Reports residual occasional word finding difficulty with some improvement noted since prior visit.  Remains on amitriptyline 10mg  evening and 25mg  nightly for facial paresthesias and occasional migraine headaches, denies any recent issues with facial paresthesias or migraine headaches.  Does report recent mild generalized headache but resolved after short duration without intervention.  She questions combining amitriptyline dosages to just bedtime alone.  Loop recorder showed evidence of A-fib on 9/10, Eliquis 5 mg BID initiated for CHA2DS2-VASc score of 5.  Plavix discontinued.  Closely followed by cardiology.  She has remained on Eliquis as well as atorvastatin without side effects.  Blood pressure well controlled.  Did have a fall 10/20 which resulted in fracture of her left foot and sprain of her left wrist, she is being closely followed by orthopedics     History provided for reference purposes only Update 11/13/2021 JM: Patient returns for 11-month follow-up after prior visit with Dr. Pearlean Martinez for hospital follow-up.  She is accompanied by her friend, Jordan Martinez.  She has been doing well since that time without any new stroke/TIA symptoms.  She has made great recovery since prior visit with improvement of speech and only occasional difficulty seeing correct words and occasional  stuttering with increased anxiety.  She continues to have some difficulty with numbers and letters but this is also greatly improved.  She completed 8 weeks of SLP.  She routinely does sudoku and word search.  She does note continued anxiety but this has been gradually improving.  She plans on further discussing with PCP at follow-up visit next month.  She also mentions eyestrain since her stroke but denies any visual loss or double vision.  She was scheduled to see her ophthalmologist for yearly follow-up but needed to reschedule and unable to be seen until September.  Compliant on Plavix and atorvastatin, denies side effects.  Blood pressure today 130/70.  She remains on amitriptyline 10mg  at dinner and 25mg  nightly - neck numbness/paresthesias stable and occasional migraine headaches but overall well controlled.  No further concerns at this time.  Update 08/11/2021 Dr. Pearlean Martinez Patient is seen today for follow-up upon her request accompanied by her friend.  She was admitted to [facility] on 07/29/2021 following 4 days of vomiting and diarrhea and dehydration.  She developed sudden onset of speech difficulties the and evaluation showed multiple small left frontoparietal as well as left occipital embolic infarcts.  CT angiogram showed 50% proximal left ICA stenosis however carotid ultrasound only showed 1-39% bilateral stenosis.  Echocardiogram showed normal ejection fraction without cardiac source of embolism.  Telemetry monitoring did not show any significant atrial fibrillation.  LDL cholesterol was marginally elevated at 81 mg percent.  Patient had been on aspirin and Plavix was added for 3 weeks.  She states she is doing well.  She is getting  outpatient speech therapy presently.  She is still having some intermittent speech and word finding difficulties as well as some transient confusion and at times was noted trouble with writing checks she struggles with letters in spelling.  At times she has trouble  cutting food holding a fork and knife.  She is concerned given trouble tying her shoelaces or feeling the gas tank.  She admits to having significant anxiety related to the problem she is having after the stroke.  She is on Lipitor 40 mg daily and tolerating it well but does have some muscle aches and pains.  She has never taken co-Q10.  She denies any episodes of numbness and paresthesias today.   Update 06/16/2021 JM:  Patient returns for follow-up unaccompanied. Right facial numbness and tingling and posterior neck pain significantly improved. Has finished physical therapy for her neck and continues exercises at home. No additional headaches. No additional transient confusion episodes - MR brain and EEG unremarkable. Denies new stroke/TIA symptoms. Remains on Atorvastatin and Aspirin without side effects and Amitriptyline 35 mg nightly for headaches and facial pain. BP today 127/81. Pt reports A1C of 6.4, unable to view in [facility], next labs in January.  Loop recorder has not shown atrial fibrillation thus far.  She plans on traveling to [place] soon to visit family (she has not been able to over the past 3 years due to medical condition).  No further concerns at this time.  Update 03/18/2021 JM: Jordan Martinez returns for 6-month follow-up unaccompanied.  She reports continued right facial numbness/tingling (more so jaw and side of neck) and posterior neck pain but overall improving currently working with PT.  Mainly symptoms only present in the evening.  She remains on amitriptyline 25 mg nightly and has not experienced any recent headaches.  Discussed use of extra 10 mg tablet as needed for breakthrough headaches as well as worsening nerve pain as needed but forget this was discussed and has not yet trialed extra dose as she has not had any additional headaches.  She does report approximately 2 weeks ago 2 separate episodes of transient confusion - 1 while trying to pump gas and forgot steps (lasted less than 1  min) and another when she mixed up checks to be mailed with her recycled papers (as both recycling and mailbox in the same vicinity where she resides) and ended up putting her checks in the recycling.  She shortly realized what she did and was able to recover her checks and mail them appropriately.  No other associated symptoms or neurological type symptoms.  She did experience similar symptoms after her prior stroke but no reoccurrence since then. No additional events since that time.  Reports compliance on aspirin and atorvastatin.  Blood pressure today 124/66. No further concerns at this time.   Update 01/14/2021 JM: Ms. Hunnewell returns for sooner follow-up visit unaccompanied to discuss imaging and further treatment options after prior visit with Dr. Pearlean Martinez 1 month ago for intermittent right lower face and neck paresthesias that first presented around 10/2020. MR brain w/wo 12/29/2020 showed chronic left temporal and left insular ischemic infarcts but no evidence of acute findings. MR cervical 12/29/2020 degenerative changes with mild narrowing C4, C5 and C6 and severe left foraminal stenosis at C3-4 and severe bilateral foraminal stenosis C4-5.  She continues to experience symptoms without new characteristics or features or worsening.  Typically worse in the evening associated with increase neck pain and stiffness.  Denies any radiating symptoms into her arms.  She has remained on amitriptyline 25 mg nightly for migraine prophylaxis with occasional 10 mg dose as needed.  Stable from stroke standpoint without new or reoccurring stroke/TIA symptoms.  Compliant on aspirin and atorvastatin without associated side effects.  Blood pressure today 134/84.  Loop recorder negative for atrial fibrillation thus far.  Update 12/18/2020 Dr. Pearlean Martinez: Ms. Knoche is a pleasant 71 year old Caucasian lady seen today for office consultation visit right face and neck numbness.  History is obtained from the patient and review of referral  notes and I personally reviewed electronic medical records in Care Everywhere and pertinent imaging films in PACS.  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  Patient has followed up for stroke follow-up in the clinic and was last seen by [facility] nurse practitioner on 10/01/2020.  Patient has a new complaint of tingling and numbness involving the outer aspect of the lower right face as well as neck.  This began about 2 months ago.  Initially this feeling would go down into the right arm as well as forearm.  This lasted only few days and since then she has been having intermittent tingling sensation on the outer aspect of the right lower face as well as the neck.  This may occur a couple of times a day.  Lasts only 1 to 2 minutes.  There are no specific triggers.  This is annoying but not very bothersome.  Occasionally she has discomfort and pain in the base of her neck.  She denies any true radicular pain or severe neck pain.  There is no history of neck injury, motor vehicle accident.  She recently saw her orthopedic physician and [place] who did x-rays of the cervical spine which showed mild disc degenerative changes.  Patient also has a history of headaches following her stroke and was prescribed Elavil 25 mg at night which seems to help she still has headaches about couple of times a week but they are tolerable and not as bothersome.  She is currently on aspirin for stroke prevention and is having mild bruising but no major bleeding episodes.  He is tolerating Lipitor well with only occasional leg cramps.  Blood pressure is usually well controlled at home the today it is elevated in office at 159/85.  She plans to see a primary care physician later this month and have  follow-up lipid profile and hemoglobin A1c checked.  She is extremely anxious that she may have cancer and worries a lot about this.  She has a daughter in [place] who is undergoing surgery this week and patient had to cancel her trip and she wants to finish her work-up prior to going there.  Update 10/01/2020 JM: Ms. Hoskie returns for stroke and headache 49-month follow-up accompanied by her friend, Napoleon Form from a stroke standpoint without new or recurring stroke/TIA symptoms Reports compliance on aspirin and atorvastatin -denies associated side effects Blood pressure today 135/83 Loop recorder has not shown atrial fibrillation thus far  Headaches have been stable only experiencing one migraine since prior visit. She took an additional amitriptyline 25 mg tablet with resolution.  She will experience very mild headaches approximately 2-3 times a week typically after dinner prior to schedule amitriptyline dosage but will quickly resolve after taking amitriptyline.  Ramon Dredge reports some  difficulty with memory and anxiety She will have difficulty remembering numbers such as her address or cell phone password -this is been consistent since her stroke She feels as though her anxiety is greatly improving where she is not consistently fearful of having additional strokes and is sleeping better at night  She is being followed by pain clinic for b/l knee pain recently receiving COOLIEF therapy by Dr. Larence Penning but denies much benefit.  She has scheduled follow-up visit with him tomorrow for further discussion  No further concerns at this time  Update 04/01/2020 JM patient returns for stroke f/u. Stable from stroke standpoint since prior visit without residual deficits. Denies new stroke/TIA symptoms. Chronic headaches stable on amitriptyline 50mg  nightly but does report morning grogginess.  Remains on aspirin 81mg  daily and atorvastatin without side effects. Blood pressure today 138/78. Loop recorder has  not shown atrial fibrillation thus far.  She continues to be followed by pain management for bilateral knee osteoarthritis.  Does report occasional anxiety due to medical conditions but overall stable.  No further concerns at this time.  Update 11/29/2019 JM: Ms. Tenner is being seen for follow-up regarding left MCA stroke in 07/2018.  She has been doing well from a stroke standpoint without new or reoccurring stroke/TIA symptoms.  Underlying chronic headaches worsened post stroke and initiated amitriptyline with eventual increase to 50mg  with great benefit. Reports mild side effects such as mild fatigue the following day and mild skin photosensitivity. She does endorse improvement of anxiety as well with use of amitriptyline. Continues on aspirin and atorvastatin for secondary stroke prevention.  Blood pressure today 118/62.  Loop recorder has not shown atrial fibrillation thus far.  No further concerns at this time.  Initial visit 08/23/2019 JM: Ms. Zeltner is a 71 year old female who is being seen today for hospital follow up.  She is a retired Financial planner.  Recovered well from a stroke standpoint without residual speech deficits.  Prior history of frontal headaches worsened with eyestrain and was told due to prior chemo treatments.  Headache intensity worsened post stroke gradually returning to baseline but continues to experience frequently.  At times she also experienced cloudiness but this has been unchanged.  She will be seeing her ophthalmologist next week for 1 year follow-up.  Describes headaches as a pressure sensation in frontal region and behind eyes bilaterally associated with photophobia and phonophobia but denies nausea/vomiting.  She will occasionally experience small flicker of light in her periphery prior to headache onset and is unsure if headaches are worse when she experiences visual aura.  She has not previously been treated for headaches or been diagnosed with migraine  headaches.  She also endorses increased anxiety due to fear of recurrent stroke.  She has completed 3 weeks DAPT and continues on aspirin alone without bleeding or bruising.  Continues on atorvastatin 40 mg daily without myalgias.  Blood pressure today satisfactory at 130/70.  She has not had follow-up with IR as recommended post revascularization during admission.  Loop recorder is not shown atrial fibrillation thus far.  No further concerns at this time.  Hospital admission 07/17/2019: Ms. Lashondra Kren is a 71 y.o. female with history of diabetes, endometrial cancer, HNPCC, hyperlipidemia, hypertension who presented on 07/17/2019 with aphasia.  Evaluated by stroke team and  Dr. Roda Shutters with stroke work-up revealing left MCA infarct due to left M2 occlusion s/p TPA and IR with TICI 3 revascularization of distal left M2 with resultant small SAH left sylvian fissure,  infarct embolic secondary to unknown source.  2D echo unremarkable.  LE venous Doppler negative.  Loop recorder placed to rule out A. fib as potential stroke etiology.  Recommended DAPT for 3 weeks and aspirin alone.  Initiated atorvastatin 40 mg daily with LDL 120.  HTN stable.  Controlled DM with A1c 7.0.  Current tobacco use with smoking cessation counseling provided.  Other stroke risk factors include advanced age, EtOH use and obesity but no prior history of stroke.  Discharged back to [facility] with recommendation of physical therapy.  Stroke: L MCA infarct due to left M2 occlusion s/p tPA and IR with resultant small SAH left sylvian fissure, infarct embolic secondary to unknown source Code Stroke CT head hyperdense L MCA. ASPECTS 10.    CTA head & neck L M2 MCA ELVO. L ICA 50% stenosis, mild L siphon plaque. Mild atherosclerosis throughout CT perfusion 7 mL core, 53 mL penumbra L MCA territory  Cerebral angio TICI3 revascularization distal L M2  F/u CT mild SAH L sylvian fissure. No infarct  MRI patchy small L MCA territory infarct (L insula,  L frontal operculum, L temporal lobe). Small SAHD L sylvian fissure stable. MRA Unremarkable  2D Echo EF 65-70%. No source of embolus  LE venous doppler No DVT Loop recorder placed 1/13 to look for AF as source of stroke ([place]) LDL 120 HgbA1c 7.0 aspirin 81 mg daily prior to admission, will start aspirin 81 and plavix 75 at d/c. Continue DAPT x 3 weeks then aspirin alone.   Therapy recommendations: continue OP PT  Disposition:  return to [facility]    ROS:   14 system review of systems performed and negative with exception of those listed in HPI  PMH:  Past Medical History:  Diagnosis Date   Allergy    Anxiety    on meds   Arthritis    on PRN meds   Cataract    bilateral cat. ext. with lens implant- in [place]   Diabetes mellitus without complication (HCC)    diet controlled. States always has borderline HgbA1C   Endometrial cancer (HCC)    endometrial CA-2019   Family history of colon cancer    daughter 62 colon CA   Family history of genetic mutation for hereditary nonpolyposis colorectal cancer (HNPCC)    GERD (gastroesophageal reflux disease)    on meds   High cholesterol    on meds   History of kidney stones    years    Hypertension    on meds   Neuromuscular disorder (HCC)    Neuropathy    Sensation of pressure in bladder area    Stroke (HCC) 07/17/2019   TIA (transient ischemic attack) 07/2021   Uterine cramping     PSH:  Past Surgical History:  Procedure Laterality Date   burnt the nerves  Right 04/29/2018   in right knee    burnt the nerves  Left 04/22/2018   left knee nerve    COLONOSCOPY  12/2018   [place]- 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   cystoscopy kidney stone   1985   HYSTERECTOMY ABDOMINAL WITH SALPINGO-OOPHORECTOMY  2019   IR CT HEAD LTD  07/18/2019   IR IMAGING GUIDED PORT INSERTION  06/15/2018   IR PERCUTANEOUS ART THROMBECTOMY/INFUSION INTRACRANIAL INC DIAG ANGIO  07/18/2019   IR REMOVAL TUN ACCESS W/ PORT W/O FL MOD SED   12/01/2018   LOOP RECORDER INSERTION N/A 07/19/2019   Procedure: LOOP RECORDER INSERTION;  Surgeon: Regan Lemming, MD;  Location: [facility] INVASIVE CV LAB;  Service: Cardiovascular;  Laterality: N/A;   POLYPECTOMY     RADIOLOGY WITH ANESTHESIA N/A 07/17/2019   Procedure: IR WITH ANESTHESIA;  Surgeon: Julieanne Cotton, MD;  Location: [facility] OR;  Service: Radiology;  Laterality: N/A;   REPLACEMENT TOTAL KNEE BILATERAL Bilateral 2018   Right x 2. 2019 undergoing intermittent injection "RFA" procedures with Dr. Petra [facility]   REVISION TOTAL KNEE ARTHROPLASTY Right 03/2017   ROBOTIC ASSISTED TOTAL HYSTERECTOMY WITH BILATERAL SALPINGO OOPHERECTOMY N/A 05/03/2018   Procedure: XI ROBOTIC ASSISTED TOTAL HYSTERECTOMY WITH BILATERAL SALPINGO OOPHORECTOMY;  Surgeon: Adolphus Birchwood, MD;  Location: [facility] ORS;  Service: Gynecology;  Laterality: N/A;   SENTINEL NODE BIOPSY N/A 05/03/2018   Procedure: SENTINEL NODE BIOPSY;  Surgeon: Adolphus Birchwood, MD;  Location: [facility] ORS;  Service: Gynecology;  Laterality: N/A;   TONSILLECTOMY AND ADENOIDECTOMY     TUBAL LIGATION      Social History:  Social History   Socioeconomic History   Marital status: Divorced    Spouse name: Not on file   Number of children: 2   Years of education: Not on file   Highest education level: Not on file  Occupational History   Occupation: retired Engineer, civil (consulting)  Tobacco Use   Smoking status: Every Day    Packs/day: 0.25    Years: 40.00    Total pack years: 10.00    Types: Cigarettes   Smokeless tobacco: Never   Tobacco comments:    4-5 loose cigarettes per day , over 20 year hx of smoking   Vaping Use   Vaping Use: Never used  Substance and Sexual Activity   Alcohol use: Yes    Alcohol/week: 14.0 standard drinks of alcohol    Types: 14 Glasses of wine per week    Comment: about 2 glasses wine daily.    Drug use: Never   Sexual activity: Not Currently    Birth control/protection: None  Other Topics Concern   Not on file   Social History Narrative   Lives alone   Social Determinants of Health   Financial Resource Strain: Not on file  Food Insecurity: Not on file  Transportation Needs: Not on file  Physical Activity: Not on file  Stress: Not on file  Social Connections: Not on file  Intimate Partner Violence: Not on file    Family History:  Family History  Adopted: Yes  Problem Relation Age of Onset   Colon cancer Daughter 51       Lynch syndrome - MSH6 +   Colon polyps Daughter 24   Healthy Son    Esophageal cancer Neg Hx    Rectal cancer Neg Hx    Stomach cancer Neg Hx     Medications:   Current Outpatient Medications on File Prior to Visit  Medication Sig Dispense Refill   acetaminophen (TYLENOL) 500 MG tablet Take 1,000 mg by mouth once as needed for mild pain or moderate pain. Extra strength     amitriptyline (ELAVIL) 10 MG tablet Take 1 tablet (10 mg total) by mouth at bedtime. (Patient taking differently: Take 10 mg by mouth daily after supper.) 90 tablet 3   amitriptyline (ELAVIL) 25 MG tablet Take 1 tablet (25 mg total) by mouth at bedtime. 90 tablet 3   amoxicillin (AMOXIL) 500 MG capsule Take 2,000 mg by mouth once as needed (dental work).     apixaban (ELIQUIS) 5 MG TABS tablet Take 1 tablet (5 mg total) by mouth 2 (two) times  daily. 60 tablet 3   atenolol (TENORMIN) 50 MG tablet Take 75 mg by mouth daily.     atorvastatin (LIPITOR) 80 MG tablet Take 80 mg by mouth daily.     Biotin 5000 MCG TABS Take 5,000 mcg by mouth daily.     clotrimazole (LOTRIMIN) 1 % cream Apply topically 2 (two) times daily.     Coenzyme Q10 (CO Q 10 PO) Take 200 mg by mouth daily.     cyclobenzaprine (FLEXERIL) 5 MG tablet Take 5 mg by mouth 3 (three) times daily as needed for muscle spasms.     hydrocortisone 2.5 % cream Apply 1 Application topically 2 (two) times daily.     Lancets (ONETOUCH DELICA PLUS LANCET33G) MISC Apply topically daily.     lisinopril (ZESTRIL) 10 MG tablet Take 10 mg by mouth  daily.     metFORMIN (GLUCOPHAGE) 500 MG tablet TAKE 1 TABLET BY MOUTH TWICE A DAY WITH MEALS 60 tablet 1   Na Sulfate-K Sulfate-Mg Sulf 17.5-3.13-1.6 GM/177ML SOLN Take 1 kit by mouth as directed. 354 mL 0   Omega-3 Fatty Acids (FISH OIL) 1200 MG CAPS Take 1,200 mg by mouth daily.      omeprazole (PRILOSEC) 40 MG capsule Take 1 capsule (40 mg total) by mouth daily. 90 capsule 3   ONETOUCH ULTRA test strip USE TO CHECK BLOOD SUGAR ONCE DAILY E11.69     Propylene Glycol 0.6 % SOLN Place 2 drops into both eyes 4 (four) times daily.     senna-docusate (SENOKOT-S) 8.6-50 MG tablet Take 1 tablet by mouth as needed.     traMADol (ULTRAM) 50 MG tablet Take 50 mg by mouth every 8 (eight) hours as needed for moderate pain.     Vitamin D, Ergocalciferol, (DRISDOL) 1.25 MG (50000 UNIT) CAPS capsule Take 50,000 Units by mouth at bedtime.     No current facility-administered medications on file prior to visit.    Allergies:   Allergies  Allergen Reactions   Amlodipine     Blurred vision    Compazine [Prochlorperazine Edisylate]     Stroke symptoms,   Gabapentin     Blurred vision and nystagmus   Lyrica [Pregabalin]     Blurred Vision    Prochlorperazine Other (See Comments)    Could not tolerate   Sertraline Other (See Comments)    Visual disturbances.    Chlorhexidine Rash    Blisters and redness to chest     Physical Exam  Vitals:   05/20/22 1113  BP: (!) 141/74  Pulse: 78  Weight: 181 lb (82.1 kg)  Height: 5\' 4"  (1.626 m)   Body mass index is 31.07 kg/m. No results found.  General: well developed, well nourished, pleasant middle-age Caucasian female, seated, in no evident distress Head: head normocephalic and atraumatic.   Neck: supple with no carotid or supraclavicular bruits Cardiovascular: regular rate and rhythm, no murmurs Musculoskeletal: Current use of L walking boot and left wrist brace Skin:  no rash/petichiae Vascular:  Normal pulses all extremities   Neurologic  Exam Mental Status: Awake and fully alert.  Occasional speech hesitation and word finding difficulty but can be understood without difficulty.  No evidence of dysarthria.  Oriented to place and time. Recent  and remote memory intact. Attention span, concentration and fund of knowledge appropriate during visit. Mood and affect appropriate.  Cranial Nerves: Pupils equal, briskly reactive to light. Extraocular movements full without nystagmus. Visual fields full to confrontation. Hearing intact. Facial sensation intact. Face,  tongue, palate moves normally and symmetrically.  Motor: Normal bulk and tone. Normal strength in all tested extremity muscles. Sensory: intact to touch, pinprick, position and vibratory sensation.  Coordination: Rapid alternating movements normal in all extremities. Finger-to-nose and heel-to-shin performed accurately bilaterally. Gait and Station: Arises from chair without difficulty. Stance is normal. Gait demonstrates normal stride length and balance without use of assistive device Reflexes: 1+ and symmetric. Toes downgoing.        ASSESSMENT: Jordan Martinez is a 71 y.o. 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 Loop recorder placed.  Vascular risk factors include history of endometrial cancer with completion of chemo 2019, HTN, HLD, and DM.  Evaluated by Dr. Pearlean Martinez 12/18/2020 for 54-month onset of intermittent right facial numbness and tingling of unknown etiology. Recurrent to embolic small left MCA and PCA infarcts in 07/2021 with residual speech difficulties and mild cognitive impairment.  Evidence of atrial fibrillation on ILR in 03/2022 and since placed on Eliquis     PLAN:  Recurrent left MCA strokes Continue Eliquis 5 mg BID and atorvastatin 80 mg daily for secondary stroke prevention Loop recorder placed 07/2019 -evidence of A-fib  03/2022.  Continue Eliquis and routine follow-up with cardiology Continue close PCP follow-up for aggressive stroke risk factor management including BP goal<130/90, HLD with LDL goal<70 and DM with A1c.<7    Face/neck paresthesias:  Unknown etiology present since 10/2020 Continue amitriptyline 35 mg nightly No recent issues Work up unremarkable  Chronic migraines post chemo:  Currently well controlled  Continue amitriptyline 35 mg nightly - refill provided     Follow up in 9 months or call earlier if needed   CC:  Nadara Eaton, MD    I spent 38 minutes of face-to-face and non-face-to-face time with patient.  This included previsit chart review, lab review, study review, order entry, electronic health record documentation, patient education and discussion regarding above diagnoses and treatment plan and answered all the questions to patient's satisfaction  Ihor Austin, [facility]  [facility] [address] [place], [place] [address]  Phone [phone] Fax [phone] Note: This document was prepared with digital dictation and possible smart phrase technology. Any transcriptional errors that result from this process are unintentional.

## 2022-05-20 ENCOUNTER — Encounter: Payer: Self-pay | Admitting: Adult Health

## 2022-05-20 ENCOUNTER — Ambulatory Visit (INDEPENDENT_AMBULATORY_CARE_PROVIDER_SITE_OTHER): Payer: Medicare Other | Admitting: Adult Health

## 2022-05-20 VITALS — BP 141/74 | HR 78 | Ht 64.0 in | Wt 181.0 lb

## 2022-05-20 DIAGNOSIS — I69328 Other speech and language deficits following cerebral infarction: Secondary | ICD-10-CM | POA: Diagnosis not present

## 2022-05-20 DIAGNOSIS — I639 Cerebral infarction, unspecified: Secondary | ICD-10-CM | POA: Diagnosis not present

## 2022-05-20 DIAGNOSIS — R202 Paresthesia of skin: Secondary | ICD-10-CM

## 2022-05-20 DIAGNOSIS — G43709 Chronic migraine without aura, not intractable, without status migrainosus: Secondary | ICD-10-CM

## 2022-05-20 MED ORDER — AMITRIPTYLINE HCL 10 MG PO TABS: 10.0000 mg | ORAL_TABLET | Freq: Every day | ORAL | 3 refills | Status: DC

## 2022-05-20 MED ORDER — AMITRIPTYLINE HCL 25 MG PO TABS: 25.0000 mg | ORAL_TABLET | Freq: Every day | ORAL | 3 refills | Status: DC

## 2022-05-20 NOTE — Patient Instructions (Addendum)
Continue amitriptyline '35mg'$  nightly for headaches prevention   Continue Eliquis (apixaban) daily  and atorvastatin  for secondary stroke prevention  Continue to follow up with PCP regarding cholesterol and blood pressure management  Maintain strict control of hypertension with blood pressure goal below 130/90, diabetes with hemoglobin A1c goal below 7.0 % and cholesterol with LDL cholesterol (bad cholesterol) goal below 70 mg/dL.   Signs of a Stroke? Follow the BEFAST method:  Balance Watch for a sudden loss of balance, trouble with coordination or vertigo Eyes Is there a sudden loss of vision in one or both eyes? Or double vision?  Face: Ask the person to smile. Does one side of the face droop or is it numb?  Arms: Ask the person to raise both arms. Does one arm drift downward? Is there weakness or numbness of a leg? Speech: Ask the person to repeat a simple phrase. Does the speech sound slurred/strange? Is the person confused ? Time: If you observe any of these signs, call 911.     Followup in the future with me in 9 months or call earlier if needed       Thank you for coming to see [place] at [facility]. I hope we have been able to provide you high quality care today.  You may receive a patient satisfaction survey over the next few weeks. We would appreciate your feedback and comments so that we may continue to improve ourselves and the health of our patients.

## 2022-05-26 DIAGNOSIS — M7712 Lateral epicondylitis, left elbow: Secondary | ICD-10-CM | POA: Insufficient documentation

## 2022-06-01 DIAGNOSIS — Z800 Family history of malignant neoplasm of digestive organs: Secondary | ICD-10-CM

## 2022-06-06 NOTE — Progress Notes (Signed)
Carelink Summary Report / Loop Recorder 

## 2022-06-08 ENCOUNTER — Ambulatory Visit (INDEPENDENT_AMBULATORY_CARE_PROVIDER_SITE_OTHER): Payer: Medicare Other

## 2022-06-08 DIAGNOSIS — I4891 Unspecified atrial fibrillation: Secondary | ICD-10-CM | POA: Diagnosis not present

## 2022-06-09 LAB — CUP PACEART REMOTE DEVICE CHECK
Date Time Interrogation Session: 20231203231810
Implantable Pulse Generator Implant Date: 20210113

## 2022-06-11 ENCOUNTER — Encounter: Payer: Medicare Other | Admitting: Gastroenterology

## 2022-07-01 ENCOUNTER — Encounter: Payer: Medicare Other | Admitting: Gastroenterology

## 2022-07-05 ENCOUNTER — Other Ambulatory Visit (HOSPITAL_COMMUNITY): Payer: Self-pay | Admitting: Nurse Practitioner

## 2022-07-13 ENCOUNTER — Ambulatory Visit (INDEPENDENT_AMBULATORY_CARE_PROVIDER_SITE_OTHER): Payer: Medicare Other

## 2022-07-13 DIAGNOSIS — I63412 Cerebral infarction due to embolism of left middle cerebral artery: Secondary | ICD-10-CM | POA: Diagnosis not present

## 2022-07-14 LAB — CUP PACEART REMOTE DEVICE CHECK
Date Time Interrogation Session: 20240107232257
Implantable Pulse Generator Implant Date: 20210113

## 2022-07-17 NOTE — Progress Notes (Signed)
Carelink Summary Report / Loop Recorder

## 2022-07-23 LAB — LAB REPORT - SCANNED
A1c: 7.8
EGFR: 96

## 2022-07-27 ENCOUNTER — Ambulatory Visit (AMBULATORY_SURGERY_CENTER): Payer: Medicare Other

## 2022-07-27 VITALS — Ht 64.0 in | Wt 181.0 lb

## 2022-07-27 DIAGNOSIS — Z800 Family history of malignant neoplasm of digestive organs: Secondary | ICD-10-CM

## 2022-07-27 DIAGNOSIS — Z86010 Personal history of colonic polyps: Secondary | ICD-10-CM

## 2022-07-27 DIAGNOSIS — Z1509 Genetic susceptibility to other malignant neoplasm: Secondary | ICD-10-CM

## 2022-07-27 MED ORDER — NA SULFATE-K SULFATE-MG SULF 17.5-3.13-1.6 GM/177ML PO SOLN: 1.0000 | Freq: Once | ORAL | 0 refills | Status: AC

## 2022-07-27 NOTE — Progress Notes (Signed)
Pre visit completed via phone call; Patient verified name, DOB, and address;  No egg or soy allergy known to patient  No issues known to pt with past sedation with any surgeries or procedures Patient denies ever being told they had issues or difficulty with intubation  No FH of Malignant Hyperthermia Pt is not on diet pills Pt is not on home 02  Pt is not on blood thinners  Pt reports issues with constipation (especially during chemo)- has used Senokot within the last month-advised to continue use prior to prep; also advised patient to increase fluid intake, fruits/veggies that are allowed, and activity as tolerated per PT orders;  No A fib or A flutter Have any cardiac testing pending--NO Pt instructed to use Singlecare.com or GoodRx for a price reduction on prep   Insurance verified during [place] appt=Medicare  Patient's chart reviewed by Osvaldo Angst CNRA prior to previsit and patient appropriate for the [place].  Previsit completed and red dot placed by patient's name on their procedure day (on provider's schedule).    Instructions mailed to patient along with [facility] coupon;

## 2022-08-04 ENCOUNTER — Encounter: Payer: Self-pay | Admitting: Gastroenterology

## 2022-08-04 ENCOUNTER — Ambulatory Visit (AMBULATORY_SURGERY_CENTER): Payer: Medicare Other | Admitting: Gastroenterology

## 2022-08-04 VITALS — BP 138/63 | HR 66 | Temp 98.7°F | Resp 13 | Ht 64.0 in | Wt 181.0 lb

## 2022-08-04 DIAGNOSIS — Z09 Encounter for follow-up examination after completed treatment for conditions other than malignant neoplasm: Secondary | ICD-10-CM | POA: Diagnosis not present

## 2022-08-04 DIAGNOSIS — Z86010 Personal history of colonic polyps: Secondary | ICD-10-CM | POA: Diagnosis not present

## 2022-08-04 DIAGNOSIS — D120 Benign neoplasm of cecum: Secondary | ICD-10-CM

## 2022-08-04 DIAGNOSIS — Z800 Family history of malignant neoplasm of digestive organs: Secondary | ICD-10-CM

## 2022-08-04 DIAGNOSIS — D123 Benign neoplasm of transverse colon: Secondary | ICD-10-CM

## 2022-08-04 DIAGNOSIS — Z1509 Genetic susceptibility to other malignant neoplasm: Secondary | ICD-10-CM

## 2022-08-04 DIAGNOSIS — D124 Benign neoplasm of descending colon: Secondary | ICD-10-CM | POA: Diagnosis not present

## 2022-08-04 DIAGNOSIS — K635 Polyp of colon: Secondary | ICD-10-CM | POA: Diagnosis not present

## 2022-08-04 MED ORDER — SODIUM CHLORIDE 0.9 % IV SOLN: 500.0000 mL | INTRAVENOUS | Status: DC

## 2022-08-04 NOTE — Progress Notes (Signed)
Pt's states no medical or surgical changes since previsit or office visit. 

## 2022-08-04 NOTE — Progress Notes (Signed)
Called to room to assist during endoscopic procedure.  Patient ID and intended procedure confirmed with present staff. Received instructions for my participation in the procedure from the performing physician.  

## 2022-08-04 NOTE — Progress Notes (Signed)
Patient to recovery, VSS, RN resuming care

## 2022-08-04 NOTE — Patient Instructions (Signed)
Handouts provided on polyps and diverticulosis.   Follow a high fiber diet (see handout). Drink at least 64 ounces of water daily. Add a daily stool bulking agent such as psyllium (an example would be Metamucil).  Continue present medications. Resume Eliquis 08/05/22.  Await pathology results.  Repeat colonoscopy in 1 year for surveillance.    YOU HAD AN ENDOSCOPIC PROCEDURE TODAY AT [facility]:   Refer to the procedure report that was given to you for any specific questions about what was found during the examination.  If the procedure report does not answer your questions, please call your gastroenterologist to clarify.  If you requested that your care partner not be given the details of your procedure findings, then the procedure report has been included in a sealed envelope for you to review at your convenience later.  YOU SHOULD EXPECT: Some feelings of bloating in the abdomen. Passage of more gas than usual.  Walking can help get rid of the air that was put into your GI tract during the procedure and reduce the bloating. If you had a lower endoscopy (such as a colonoscopy or flexible sigmoidoscopy) you may notice spotting of blood in your stool or on the toilet paper. If you underwent a bowel prep for your procedure, you may not have a normal bowel movement for a few days.  Please Note:  You might notice some irritation and congestion in your nose or some drainage.  This is from the oxygen used during your procedure.  There is no need for concern and it should clear up in a day or so.  SYMPTOMS TO REPORT IMMEDIATELY:  Following lower endoscopy (colonoscopy or flexible sigmoidoscopy):  Excessive amounts of blood in the stool  Significant tenderness or worsening of abdominal pains  Swelling of the abdomen that is new, acute  Fever of 100F or higher  For urgent or emergent issues, a gastroenterologist can be reached at any hour by calling [phone]. Do not use  MyChart messaging for urgent concerns.    DIET:  We do recommend a small meal at first, but then you may proceed to your regular diet.  Drink plenty of fluids but you should avoid alcoholic beverages for 24 hours.  ACTIVITY:  You should plan to take it easy for the rest of today and you should NOT DRIVE or use heavy machinery until tomorrow (because of the sedation medicines used during the test).    FOLLOW UP: Our staff will call the number listed on your records the next business day following your procedure.  We will call around 7:15- 8:00 am to check on you and address any questions or concerns that you may have regarding the information given to you following your procedure. If we do not reach you, we will leave a message.     If any biopsies were taken you will be contacted by phone or by letter within the next 1-3 weeks.  Please call us at [phone] if you have not heard about the biopsies in 3 weeks.    SIGNATURES/CONFIDENTIALITY: You and/or your care partner have signed paperwork which will be entered into your electronic medical record.  These signatures attest to the fact that that the information above on your After Visit Summary has been reviewed and is understood.  Full responsibility of the confidentiality of this discharge information lies with you and/or your care-partner.

## 2022-08-04 NOTE — Progress Notes (Signed)
Referring Provider: Javier Glazier, MD Primary Care Physician:  Javier Glazier, MD  Indication for Colonoscopy:  Donnal Debar Syndrome   IMPRESSION:  Lynch Syndrome Need for colon cancer screening Appropriate candidate for monitored anesthesia care  PLAN: Colonoscopy in the [place] today   HPI: Jordan Martinez is a 72 y.o. female presents for surveillance colonoscopy in the setting of Lynch Syndrome. Last colonoscopy 2022.  Daughter with colon cancer at age 42.    Past Medical History:  Diagnosis Date   Allergy    Anxiety    on meds   Arthritis    on PRN meds   Cataract    bilateral cat. ext. with lens implant- in [place]   Diabetes mellitus without complication ([place])    diet controlled. States always has borderline HgbA1C   Endometrial cancer ([place])    endometrial CA-2019   Family history of colon cancer    daughter 27 colon CA   Family history of genetic mutation for hereditary nonpolyposis colorectal cancer ([facility])    GERD (gastroesophageal reflux disease)    on meds   High cholesterol    on meds   History of kidney stones    years    Hypertension    on meds   Neuromuscular disorder ([place])    Neuropathy    Sensation of pressure in bladder area    Stroke ([place]) 07/17/2019   07/2021 (started on Eliquis in 03/2022)   TIA (transient ischemic attack) 07/2021   Uterine cramping     Past Surgical History:  Procedure Laterality Date   burnt the nerves  Right 04/29/2018   in right knee    burnt the nerves  Left 04/22/2018   left knee nerve    COLONOSCOPY  12/2018   Jordan Martinez- 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   cystoscopy kidney stone   1985   HYSTERECTOMY ABDOMINAL WITH SALPINGO-OOPHORECTOMY  2019   IR CT HEAD LTD  07/18/2019   IR IMAGING GUIDED PORT INSERTION  06/15/2018   IR PERCUTANEOUS ART THROMBECTOMY/INFUSION INTRACRANIAL INC DIAG ANGIO  07/18/2019   IR REMOVAL TUN ACCESS W/ PORT W/O FL MOD SED  12/01/2018   LOOP RECORDER INSERTION N/A 07/19/2019    Procedure: LOOP RECORDER INSERTION;  Surgeon: Constance Haw, MD;  Location: [facility];  Service: Cardiovascular;  Laterality: N/A;   POLYPECTOMY     RADIOLOGY WITH ANESTHESIA N/A 07/17/2019   Procedure: IR WITH ANESTHESIA;  Surgeon: Luanne Bras, MD;  Location: [facility];  Service: Radiology;  Laterality: N/A;   REPLACEMENT TOTAL KNEE BILATERAL Bilateral 2018   Right x 2. 2019 undergoing intermittent injection "RFA" procedures with Dr. Sheliah Mends [facility]   REVISION TOTAL KNEE ARTHROPLASTY Right 03/2017   ROBOTIC ASSISTED TOTAL HYSTERECTOMY WITH BILATERAL SALPINGO OOPHERECTOMY N/A 05/03/2018   Procedure: XI ROBOTIC ASSISTED TOTAL HYSTERECTOMY WITH BILATERAL SALPINGO OOPHORECTOMY;  Surgeon: Everitt Amber, MD;  Location: [facility];  Service: Gynecology;  Laterality: N/A;   SENTINEL NODE BIOPSY N/A 05/03/2018   Procedure: SENTINEL NODE BIOPSY;  Surgeon: Everitt Amber, MD;  Location: [facility];  Service: Gynecology;  Laterality: N/A;   TONSILLECTOMY AND ADENOIDECTOMY     TUBAL LIGATION      Current Outpatient Medications  Medication Sig Dispense Refill   acetaminophen (TYLENOL) 500 MG tablet Take 1,000 mg by mouth once as needed for mild pain or moderate pain. Extra strength     amitriptyline (ELAVIL) 10 MG tablet Take 1 tablet (10 mg total) by mouth at bedtime.  90 tablet 3   amitriptyline (ELAVIL) 25 MG tablet Take 1 tablet (25 mg total) by mouth at bedtime. 90 tablet 3   amoxicillin (AMOXIL) 500 MG capsule Take 2,000 mg by mouth once as needed (dental work).     apixaban (ELIQUIS) 5 MG TABS tablet TAKE 1 TABLET BY MOUTH TWICE A DAY 60 tablet 5   atenolol (TENORMIN) 50 MG tablet Take 75 mg by mouth daily.     atorvastatin (LIPITOR) 80 MG tablet Take 80 mg by mouth daily.     Biotin 5000 MCG TABS Take 5,000 mcg by mouth daily.     clotrimazole (LOTRIMIN) 1 % cream Apply topically 2 (two) times daily. (Patient not taking: Reported on 07/27/2022)     Coenzyme Q10 (CO Q 10 PO) Take 200  mg by mouth daily.     cyclobenzaprine (FLEXERIL) 5 MG tablet Take 5 mg by mouth 3 (three) times daily as needed for muscle spasms.     hydrocortisone 2.5 % cream Apply 1 Application topically 2 (two) times daily. (Patient not taking: Reported on 07/27/2022)     Lancets (ONETOUCH DELICA PLUS [id number]) MISC Apply topically daily.     lisinopril (ZESTRIL) 10 MG tablet Take 10 mg by mouth daily.     metFORMIN (GLUCOPHAGE) 500 MG tablet TAKE 1 TABLET BY MOUTH TWICE A DAY WITH MEALS 60 tablet 1   Na Sulfate-K Sulfate-Mg Sulf 17.5-3.13-1.6 GM/177ML SOLN Take 1 kit by mouth as directed. 354 mL 0   Omega-3 Fatty Acids (FISH OIL PO) Take 1,400 mg by mouth daily.     omeprazole (PRILOSEC) 40 MG capsule Take 1 capsule (40 mg total) by mouth daily. 90 capsule 3   ONETOUCH ULTRA test strip USE TO CHECK BLOOD SUGAR ONCE DAILY E11.69     Propylene Glycol 0.6 % SOLN Place 2 drops into both eyes 4 (four) times daily.     senna-docusate (SENOKOT-S) 8.6-50 MG tablet Take 1 tablet by mouth as needed for mild constipation.     Current Facility-Administered Medications  Medication Dose Route Frequency Provider Last Rate Last Admin   0.9 %  sodium chloride infusion  500 mL Intravenous Continuous Thornton Park, MD        Allergies as of 08/04/2022 - Review Complete 07/27/2022  Allergen Reaction Noted   Amlodipine  03/31/2018   Compazine [prochlorperazine edisylate]  03/31/2018   Gabapentin  03/31/2018   Lyrica [pregabalin]  03/31/2018   Prochlorperazine Other (See Comments) 01/11/2018   Sertraline Other (See Comments) 04/12/2018   Chlorhexidine Rash 08/01/2018    Family History  Adopted: Yes  Problem Relation Age of Onset   Colon cancer Daughter 87       Lynch syndrome - MSH6 +   Colon polyps Daughter 76   Healthy Son    Esophageal cancer Neg Hx    Rectal cancer Neg Hx    Stomach cancer Neg Hx      Physical Exam: General:   Alert,  well-nourished, pleasant and cooperative in NAD Head:   Normocephalic and atraumatic. Eyes:  Sclera clear, no icterus.   Conjunctiva pink. Mouth:  No deformity or lesions.   Neck:  Supple; no masses or thyromegaly. Lungs:  Clear throughout to auscultation.   No wheezes. Heart:  Regular rate and rhythm; no murmurs. Abdomen:  Soft, non-tender, nondistended, normal bowel sounds, no rebound or guarding.  Msk:  Symmetrical. No boney deformities LAD: No inguinal or umbilical LAD Extremities:  No clubbing or edema. Neurologic:  Alert and  oriented x4;  grossly nonfocal Skin:  No obvious rash or bruise. Psych:  Alert and cooperative. Normal mood and affect.     Studies/Results: No results found.    Jordan Martinez L. Tarri Glenn, MD, MPH 08/04/2022, 10:25 AM

## 2022-08-04 NOTE — Op Note (Signed)
[place] Patient Name: Jordan Martinez Procedure Date: 08/04/2022 10:45 AM MRN: [id number] Endoscopist: Thornton Park MD, MD, [id number] Age: 72 Referring MD:  Date of Birth: May 12, 1951 Gender: Female Account #: [id number] Procedure:                Colonoscopy Indications:              MSH6-related Lynch Syndrome                           Last colonoscopy 03/2021 Medicines:                Monitored Anesthesia Care Procedure:                Pre-Anesthesia Assessment:                           - Prior to the procedure, a History and Physical                            was performed, and patient medications and                            allergies were reviewed. The patient's tolerance of                            previous anesthesia was also reviewed. The risks                            and benefits of the procedure and the sedation                            options and risks were discussed with the patient.                            All questions were answered, and informed consent                            was obtained. Prior Anticoagulants: The patient has                            taken Eliquis (apixaban), last dose was 2 days                            prior to procedure. ASA Grade Assessment: III - A                            patient with severe systemic disease. After                            reviewing the risks and benefits, the patient was                            deemed in satisfactory condition to undergo the  procedure.                           After obtaining informed consent, the colonoscope                            was passed under direct vision. Throughout the                            procedure, the patient's blood pressure, pulse, and                            oxygen saturations were monitored continuously. The                            Olympus CF-HQ190L ([id number]) Colonoscope was                             introduced through the anus and advanced to the 3                            cm into the ileum. A second forward view of the                            right colon was performed. The colonoscopy was                            performed without difficulty. The patient tolerated                            the procedure well. The quality of the bowel                            preparation was good. The terminal ileum, ileocecal                            valve, appendiceal orifice, and rectum were                            photographed. Scope In: 10:52:44 AM Scope Out: 11:10:42 AM Scope Withdrawal Time: 0 hours 13 minutes 7 seconds  Total Procedure Duration: 0 hours 17 minutes 58 seconds  Findings:                 The perianal and digital rectal examinations were                            normal.                           Multiple medium-mouthed and small-mouthed                            diverticula were found in the sigmoid colon and  descending colon.                           Many flat polyps were found in the rectum with the                            appearance of hyperplastic polyps. The polyps were                            diminutive in size and were not removed during this                            procedure.                           Three flat polyps were found in the descending                            colon, transverse colon and cecum. The polyps were                            1 to 2 mm in size. These polyps were removed with a                            cold snare. Resection and retrieval were complete.                            Estimated blood loss was minimal.                           The exam was otherwise without abnormality on                            direct and retroflexion views. Complications:            No immediate complications. Estimated Blood Loss:     Estimated blood loss was minimal. Impression:               -  Diverticulosis in the sigmoid colon and in the                            descending colon.                           - Many diminutive polyps in the rectum.                           - Three 1 to 2 mm polyps in the descending colon,                            in the transverse colon and in the cecum, removed                            with a cold snare. Resected and retrieved.                           -  The examination was otherwise normal on direct                            and retroflexion views. Recommendation:           - Patient has a contact number available for                            emergencies. The signs and symptoms of potential                            delayed complications were discussed with the                            patient. Return to normal activities tomorrow.                            Written discharge instructions were provided to the                            patient.                           - High fiber diet.                           - Continue present medications.                           - Resume Eliquis 08/05/22.                           - Await pathology results.                           - Repeat colonoscopy in 1 year for surveillance.                           - Emerging evidence supports eating a diet of                            fruits, vegetables, grains, calcium, and yogurt                            while reducing red meat and alcohol may reduce the                            risk of colon cancer.                           - Thank you for allowing me to be involved in your                            colon cancer prevention. Thornton Park MD, MD 08/04/2022 11:18:50 AM This report has been signed electronically.

## 2022-08-05 ENCOUNTER — Telehealth: Payer: Self-pay

## 2022-08-05 NOTE — Telephone Encounter (Signed)
  Follow up Call-     08/04/2022   10:25 AM 03/31/2021    7:47 AM 03/14/2020   11:03 AM  Call back number  Post procedure Call Back phone  # [phone]  Permission to leave phone message Yes Yes Yes     Patient questions:  Do you have a fever, pain , or abdominal swelling? No. Pain Score  0 *  Have you tolerated food without any problems? Yes.    Have you been able to return to your normal activities? Yes.    Do you have any questions about your discharge instructions: Diet   No. Medications  No. Follow up visit  No.  Do you have questions or concerns about your Care? No.  Actions: * If pain score is 4 or above: No action needed, pain <4.

## 2022-08-07 ENCOUNTER — Encounter: Payer: Self-pay | Admitting: Gastroenterology

## 2022-08-13 ENCOUNTER — Encounter (HOSPITAL_COMMUNITY): Payer: Self-pay | Admitting: *Deleted

## 2022-08-17 ENCOUNTER — Ambulatory Visit: Payer: Medicare Other

## 2022-08-17 DIAGNOSIS — I63412 Cerebral infarction due to embolism of left middle cerebral artery: Secondary | ICD-10-CM | POA: Diagnosis not present

## 2022-08-18 LAB — CUP PACEART REMOTE DEVICE CHECK
Date Time Interrogation Session: 20240209231209
Implantable Pulse Generator Implant Date: 20210113

## 2022-08-25 NOTE — Progress Notes (Signed)
Carelink Summary Report / Loop Recorder 

## 2022-09-21 ENCOUNTER — Ambulatory Visit (INDEPENDENT_AMBULATORY_CARE_PROVIDER_SITE_OTHER): Payer: Medicare Other

## 2022-09-21 DIAGNOSIS — I63412 Cerebral infarction due to embolism of left middle cerebral artery: Secondary | ICD-10-CM

## 2022-09-23 LAB — CUP PACEART REMOTE DEVICE CHECK
Date Time Interrogation Session: 20240317231518
Implantable Pulse Generator Implant Date: 20210113

## 2022-09-25 ENCOUNTER — Encounter: Payer: Self-pay | Admitting: Cardiology

## 2022-09-25 ENCOUNTER — Ambulatory Visit: Payer: Medicare Other | Attending: Cardiology | Admitting: Cardiology

## 2022-09-25 VITALS — BP 118/68 | HR 61 | Ht 64.0 in | Wt 171.0 lb

## 2022-09-25 DIAGNOSIS — D6869 Other thrombophilia: Secondary | ICD-10-CM

## 2022-09-25 DIAGNOSIS — I10 Essential (primary) hypertension: Secondary | ICD-10-CM | POA: Insufficient documentation

## 2022-09-25 DIAGNOSIS — I480 Paroxysmal atrial fibrillation: Secondary | ICD-10-CM | POA: Insufficient documentation

## 2022-09-25 NOTE — Progress Notes (Signed)
Electrophysiology Office Note   Date:  09/25/2022   ID:  Micaya Arceo, DOB [birth year], MRN [id number]  PCP:  Javier Glazier, MD  Cardiologist:   Primary Electrophysiologist:  Harve Spradley Meredith Leeds, MD    Chief Complaint: AF   History of Present Illness: Jordan Martinez is a 72 y.o. female who is being seen today for the evaluation of AF at the request of Javier Glazier, MD. Presenting today for electrophysiology evaluation.  She has a history significant for diabetes, hypertension, hyperlipidemia, CVA.  She had an ILR implanted after her CVA.  ILR showed an episode of atrial fibrillation.  She has since been switched from Plavix to Eliquis.  Today, she denies symptoms of palpitations, chest pain, shortness of breath, orthopnea, PND, lower extremity edema, claudication, dizziness, presyncope, syncope, bleeding, or neurologic sequela. The patient is tolerating medications without difficulties.    Past Medical History:  Diagnosis Date   Allergy    Anxiety    on meds   Arthritis    on PRN meds   Cataract    bilateral cat. ext. with lens implant- in [place]   Diabetes mellitus without complication ([place])    diet controlled. States always has borderline HgbA1C   Endometrial cancer ([place])    endometrial CA-2019   Family history of colon cancer    daughter 76 colon CA   Family history of genetic mutation for hereditary nonpolyposis colorectal cancer ([facility])    GERD (gastroesophageal reflux disease)    on meds   High cholesterol    on meds   History of kidney stones    years    Hypertension    on meds   Neuromuscular disorder ([place])    Neuropathy    Sensation of pressure in bladder area    Stroke ([place]) 07/17/2019   07/2021 (started on Eliquis in 03/2022)   TIA (transient ischemic attack) 07/2021   Uterine cramping    Past Surgical History:  Procedure Laterality Date   burnt the nerves  Right 04/29/2018   in right knee    burnt the nerves  Left 04/22/2018   left knee  nerve    COLONOSCOPY  12/2018   [place]- 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   cystoscopy kidney stone   1985   HYSTERECTOMY ABDOMINAL WITH SALPINGO-OOPHORECTOMY  2019   IR CT HEAD LTD  07/18/2019   IR IMAGING GUIDED PORT INSERTION  06/15/2018   IR PERCUTANEOUS ART THROMBECTOMY/INFUSION INTRACRANIAL INC DIAG ANGIO  07/18/2019   IR REMOVAL TUN ACCESS W/ PORT W/O FL MOD SED  12/01/2018   LOOP RECORDER INSERTION N/A 07/19/2019   Procedure: LOOP RECORDER INSERTION;  Surgeon: Constance Haw, MD;  Location: [facility];  Service: Cardiovascular;  Laterality: N/A;   POLYPECTOMY     RADIOLOGY WITH ANESTHESIA N/A 07/17/2019   Procedure: IR WITH ANESTHESIA;  Surgeon: Luanne Bras, MD;  Location: [facility];  Service: Radiology;  Laterality: N/A;   REPLACEMENT TOTAL KNEE BILATERAL Bilateral 2018   Right x 2. 2019 undergoing intermittent injection "RFA" procedures [facility]   REVISION TOTAL KNEE ARTHROPLASTY Right 03/2017   ROBOTIC ASSISTED TOTAL HYSTERECTOMY WITH BILATERAL SALPINGO OOPHERECTOMY N/A 05/03/2018   Procedure: XI ROBOTIC ASSISTED TOTAL HYSTERECTOMY WITH BILATERAL SALPINGO OOPHORECTOMY;  Surgeon: Everitt Amber, MD;  Location: [facility];  Service: Gynecology;  Laterality: N/A;   SENTINEL NODE BIOPSY N/A 05/03/2018   Procedure: SENTINEL NODE BIOPSY;  Surgeon: Everitt Amber, MD;  Location: [facility];  Service: Gynecology;  Laterality: N/A;   TONSILLECTOMY AND ADENOIDECTOMY     TUBAL LIGATION       Current Outpatient Medications  Medication Sig Dispense Refill   acetaminophen (TYLENOL) 500 MG tablet Take 1,000 mg by mouth once as needed for mild pain or moderate pain. Extra strength     amitriptyline (ELAVIL) 10 MG tablet Take 1 tablet (10 mg total) by mouth at bedtime. 90 tablet 3   amitriptyline (ELAVIL) 25 MG tablet Take 1 tablet (25 mg total) by mouth at bedtime. 90 tablet 3   amoxicillin (AMOXIL) 500 MG capsule Take 2,000 mg by mouth once as needed  (dental work).     apixaban (ELIQUIS) 5 MG TABS tablet TAKE 1 TABLET BY MOUTH TWICE A DAY 60 tablet 5   atenolol (TENORMIN) 50 MG tablet Take 75 mg by mouth daily.     atorvastatin (LIPITOR) 80 MG tablet Take 80 mg by mouth daily.     Biotin 5000 MCG TABS Take 5,000 mcg by mouth daily.     clotrimazole (LOTRIMIN) 1 % cream Apply 1 Application topically as needed.     Coenzyme Q10 (CO Q 10 PO) Take 200 mg by mouth daily.     cyclobenzaprine (FLEXERIL) 5 MG tablet Take 5 mg by mouth 3 (three) times daily as needed for muscle spasms.     hydrocortisone 2.5 % cream Apply 1 Application topically as needed.     Lancets (ONETOUCH DELICA PLUS 123XX123) MISC Apply topically daily.     lisinopril (ZESTRIL) 10 MG tablet Take 10 mg by mouth daily.     metFORMIN (GLUCOPHAGE) 500 MG tablet TAKE 1 TABLET BY MOUTH TWICE A DAY WITH MEALS 60 tablet 1   Omega-3 Fatty Acids (FISH OIL PO) Take 1,400 mg by mouth daily.     omeprazole (PRILOSEC) 40 MG capsule Take 1 capsule (40 mg total) by mouth daily. 90 capsule 3   ONETOUCH ULTRA test strip USE TO CHECK BLOOD SUGAR ONCE DAILY E11.69     Propylene Glycol 0.6 % SOLN Place 2 drops into both eyes 4 (four) times daily.     senna-docusate (SENOKOT-S) 8.6-50 MG tablet Take 1 tablet by mouth as needed for mild constipation.     Na Sulfate-K Sulfate-Mg Sulf 17.5-3.13-1.6 GM/177ML SOLN Take 1 kit by mouth as directed. 354 mL 0   No current facility-administered medications for this visit.    Allergies:   Amlodipine, Compazine [prochlorperazine edisylate], Gabapentin, Lyrica [pregabalin], Prochlorperazine, Sertraline, and Chlorhexidine   Social History:  The patient  reports that she has been smoking cigarettes. She has a 10.00 pack-year smoking history. She has never used smokeless tobacco. She reports current alcohol use of about 14.0 standard drinks of alcohol per week. She reports that she does not use drugs.   Family History:  The patient's family history includes  Colon cancer (age of onset: 22) in her daughter; Colon polyps (age of onset: 61) in her daughter; Healthy in her son. She was adopted.    ROS:  Please see the history of present illness.   Otherwise, review of systems is positive for none.   All other systems are reviewed and negative.    PHYSICAL EXAM: VS:  BP 118/68   Pulse 61   Ht 5\' 4"  (1.626 m)   Wt 171 lb (77.6 kg)   SpO2 96%   BMI 29.35 kg/m  , BMI Body mass index is 29.35 kg/m. GEN: Well nourished, well developed, in no acute distress  HEENT: normal  Neck: no JVD, carotid bruits, or masses Cardiac: RRR; no murmurs, rubs, or gallops,no edema  Respiratory:  clear to auscultation bilaterally, normal work of breathing GI: soft, nontender, nondistended, + BS MS: no deformity or atrophy  Skin: warm and dry, device pocket is well healed Neuro:  Strength and sensation are intact Psych: euthymic mood, full affect  EKG:  EKG is ordered today. Personal review of the ekg ordered shows sinus rhythm  Device interrogation is reviewed today in detail.  See PaceArt for details.   Recent Labs: 04/16/2022: BUN 16; Creatinine, Ser 0.65; Hemoglobin 10.3; Platelets 271; Potassium 4.6; Sodium 139    Lipid Panel     Component Value Date/Time   CHOL 224 (H) 07/18/2019 0601   TRIG 91 07/18/2019 0601   HDL 86 07/18/2019 0601   CHOLHDL 2.6 07/18/2019 0601   VLDL 18 07/18/2019 0601   LDLCALC 120 (H) 07/18/2019 0601     Wt Readings from Last 3 Encounters:  09/25/22 171 lb (77.6 kg)  08/04/22 181 lb (82.1 kg)  07/27/22 181 lb (82.1 kg)      Other studies Reviewed: Additional studies/ records that were reviewed today include: TTE 2021  Review of the above records today demonstrates:   1. Left ventricular ejection fraction, by visual estimation, is 65 to  70%. The left ventricle has hyperdynamic function. There is mildly  increased left ventricular hypertrophy.   2. Left ventricular diastolic parameters are consistent with Grade I   diastolic dysfunction (impaired relaxation).   3. The left ventricle has no regional wall motion abnormalities.   4. Global right ventricle has hyperdynamic systolic function.The right  ventricular size is normal. No increase in right ventricular wall  thickness.   5. Left atrial size was normal.   6. Right atrial size was normal.   7. Mild mitral annular calcification.   8. The mitral valve is normal in structure. No evidence of mitral valve  regurgitation. No evidence of mitral stenosis.   9. The tricuspid valve is normal in structure.  10. The aortic valve is normal in structure. Aortic valve regurgitation is  not visualized. Mild aortic valve sclerosis without stenosis.  11. The pulmonic valve was normal in structure. Pulmonic valve  regurgitation is not visualized.  12. The inferior vena cava is normal in size with greater than 50%  respiratory variability, suggesting right atrial pressure of 3 mmHg.    ASSESSMENT AND PLAN:  1.  Paroxysmal atrial fibrillation: Found on ILR monitor.  CHA2DS2-VASc of 5.  Currently on Eliquis.  Is having minimal burden of atrial fibrillation.  She is quite happy with her control.  Delaine Canter continue with current management.  2.  Hypertension: Currently well-controlled  3.  Hyperlipidemia: Continue management per primary physician  4.  Secondary hypercoagulable state: Currently on Eliquis for atrial fibrillation   Current medicines are reviewed at length with the patient today.   The patient does not have concerns regarding her medicines.  The following changes were made today:  none  Labs/ tests ordered today include:  Orders Placed This Encounter  Procedures   EKG 12-Lead     Disposition:   FU 1 year  Signed, Johara Lodwick Meredith Leeds, MD  09/25/2022 12:26 PM     [facility] [address] [place] [address] [phone] (office) [phone] (fax)

## 2022-09-25 NOTE — Patient Instructions (Signed)
Medication Instructions:  Your physician recommends that you continue on your current medications as directed. Please refer to the Current Medication list given to you today.  *If you need a refill on your cardiac medications before your next appointment, please call your pharmacy*   Lab Work: None ordered   Testing/Procedures: None ordered   Follow-Up: At [facility], you and your health needs are our priority.  As part of our continuing mission to provide you with exceptional heart care, we have created designated Provider Care Teams.  These Care Teams include your primary Cardiologist (physician) and Advanced Practice Providers (APPs -  Physician Assistants and Nurse Practitioners) who all work together to provide you with the care you need, when you need it.  Remote monitoring is used to monitor your Pacemaker or ICD from home. This monitoring reduces the number of office visits required to check your device to one time per year. It allows [place] to keep an eye on the functioning of your device to ensure it is working properly. You are scheduled for a device check from home on 10/26/2022. You may send your transmission at any time that day. If you have a wireless device, the transmission will be sent automatically. After your physician reviews your transmission, you will receive a postcard with your next transmission date.  Your next appointment:   1 year(s)  The format for your next appointment:   In Person  Provider:   You will follow up in the [facility] located at [facility]. Your provider will be: Clint R. Fenton, PA-C Luther Bradley, [place]   Thank you for choosing [facility]!!     Trinidad Curet, RN [phone]

## 2022-10-01 NOTE — Progress Notes (Signed)
Carelink Summary Report / Loop Recorder 

## 2022-10-09 ENCOUNTER — Encounter: Payer: Self-pay | Admitting: Gynecologic Oncology

## 2022-10-09 ENCOUNTER — Inpatient Hospital Stay: Payer: Medicare Other | Attending: Gynecologic Oncology | Admitting: Gynecologic Oncology

## 2022-10-09 VITALS — BP 126/74 | HR 75 | Temp 98.4°F | Wt 173.8 lb

## 2022-10-09 DIAGNOSIS — Z923 Personal history of irradiation: Secondary | ICD-10-CM | POA: Insufficient documentation

## 2022-10-09 DIAGNOSIS — Z1509 Genetic susceptibility to other malignant neoplasm: Secondary | ICD-10-CM | POA: Insufficient documentation

## 2022-10-09 DIAGNOSIS — Z90722 Acquired absence of ovaries, bilateral: Secondary | ICD-10-CM | POA: Diagnosis not present

## 2022-10-09 DIAGNOSIS — Z8542 Personal history of malignant neoplasm of other parts of uterus: Secondary | ICD-10-CM | POA: Diagnosis present

## 2022-10-09 DIAGNOSIS — Z9221 Personal history of antineoplastic chemotherapy: Secondary | ICD-10-CM | POA: Insufficient documentation

## 2022-10-09 DIAGNOSIS — Z90710 Acquired absence of both cervix and uterus: Secondary | ICD-10-CM | POA: Diagnosis not present

## 2022-10-09 DIAGNOSIS — C541 Malignant neoplasm of endometrium: Secondary | ICD-10-CM

## 2022-10-09 NOTE — Progress Notes (Signed)
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 Endometrium, biopsy - HIGH GRADE CARCINOMA, SEE COMMENT. 2. Cervix, biopsy - UNREMARKABLE ENDOCERVICAL GLANDULAR AND SQUAMOUS MUCOSA. - MULTIPLE DETACHED FRAGMENTS OF HIGH GRADE CARCINOMA, MORPHOLOGICALLY SIMILAR TO SPECIMEN 1. Microscopic Comment 1. 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   04/21/2018 Imaging   US pelvis Abnormally thickened endometrium measuring up to 16 mm. In the setting of post-menopausal bleeding, endometrial sampling is indicated to exclude carcinoma. If results are benign, sonohysterogram should be considered for focal lesion work-up.    04/27/2018 Imaging   Ct abdomen and pelvis 1. Thickening of the endometrium identified compatible with endometrial carcinoma. 2. No findings to suggest metastatic adenopathy or distant metastatic disease. 3. Hiatal hernia 4.  Aortic Atherosclerosis (ICD10-I70.0).     05/02/2018 Tumor Marker   Patient's tumor was tested for the following markers: CA-125 Results of the tumor marker test revealed 65.3   05/03/2018 Pathology Results   1. Lymph node, sentinel, biopsy, right obturator - ONE OF ONE LYMPH NODES NEGATIVE FOR CARCINOMA (0/1). 2. Lymph node, sentinel, biopsy, left external iliac - FOUR OF FOUR LYMPH NODES NEGATIVE FOR CARCINOMA (0/4). 3. 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 TUMOR INVADES LESS  THAN ONE HALF OF THE MYOMETRIUM. SEE ONCOLOGY TABLE. -SEROSA: UNREMARKABLE. NO MALIGNANCY. - CERVIX: BENIGN SQUAMOUS AND ENDOCERVICAL MUCOSA. NO DYSPLASIA OR MALIGNANCY. - BILATERAL OVARIES: INCLUSION CYSTS. NO MALIGNANCY. - BILATERAL FALLOPIAN TUBES: UNREMARKABLE. NO MALIGNANCY. Microscopic Comment 3. UTERUS, CARCINOMA OR CARCINOSARCOMA Procedure: Total hysterectomy with bilateral salpingo-oophorectomy. Right obturator and left external iliac lymph node biopsies. Histologic type: Endometrioid adenocarcinoma with squamous differentiation. Histologic Grade: FIGO grade I. Myometrial invasion: Depth of invasion: 3 mm Myometrial thickness: 12 mm Uterine Serosa Involvement: Not identified. Cervical stromal involvement: Not identified. Extent of involvement of other organs: Uninvolved. Lymphovascular invasion: Not identified. Regional Lymph Nodes: Examined: 5 Sentinel 0 Non-sentinel 5 Total Lymph nodes with metastasis: 0 Isolated tumor cells (< 0.2 mm): 0 Micrometastasis: (> 0.2 mm and < 2.0 mm): 0 Macrometastasis: (> 2.0 mm): 0 Extracapsular extension: N/A. MMR / MSI testing: Will be ordered. Pathologic Stage Classification (pTNM, AJCC 8th edition): pT1a, pN0 FIGO Stage: IA Representative Tumor Block: 3D-G Comment: Pancytokeratin was performed on the lymph nodes and is negative   05/03/2018 Surgery   Operation: Robotic-assisted laparoscopic total hysterectomy with bilateral salpingoophorectomy, SLN biopsy    Surgeon: Quinn Axe    Operative Findings:  : 6cm normal appearing uterus. Normal tubes and ovaries. Normal omentum and diaphragm. No suspicious nodes.      05/03/2018 Pathology Results   PERITONEAL WASHING(SPECIMEN 1 OF 1 COLLECTED 05/03/18): MALIGNANT CELLS CONSISTENT WITH METASTATIC ADENOCARCINOMA.   05/18/2018 Tumor Marker   Patient's tumor was tested for the following markers: CA-125 Results of the tumor marker test revealed 77.5   05/27/2018 Genetic  Testing   MSH6 c.2731C>T pathogenic variant and GATA2 c.1232C>T VUS identified on the multicancer panel.  The Multi-Gene Panel offered by [facility] includes sequencing and/or deletion duplication testing of the following 85 genes: AIP, ALK,  APC, ATM, AXIN2,BAP1,  BARD1, BLM, BMPR1A, BRCA1, BRCA2, BRIP1, CASR, CDC73, CDH1, CDK4, CDKN1B, CDKN1C, CDKN2A (p14ARF), CDKN2A (p16INK4a), CEBPA, CHEK2, CTNNA1, DICER1, DIS3L2, EGFR (c.2369C>T, p.Thr790Met variant only), EPCAM (Deletion/duplication testing only), FH, FLCN, GATA2, GPC3, GREM1 (Promoter region deletion/duplication testing only), HOXB13 (c.251G>A, p.Gly84Glu), HRAS, KIT, MAX, MEN1, MET, MITF (c.952G>A, p.Glu318Lys variant only), MLH1, MSH2, MSH3, MSH6, MUTYH, NBN, NF1, NF2, NTHL1, PALB2, PDGFRA, PHOX2B, PMS2, POLD1, POLE, POT1, PRKAR1A, PTCH1, PTEN, RAD50, RAD51C, RAD51D, RB1, RECQL4, RET, RNF43, RUNX1, SDHAF2, SDHA (sequence changes only), SDHB, SDHC, SDHD, SMAD4, SMARCA4, SMARCB1, SMARCE1, STK11, SUFU, TERC, TERT, TMEM127, TP53, TSC1, TSC2, VHL, WRN and WT1.  The report date is 05/27/2018.   06/09/2018 Cancer Staging   Staging form: Corpus Uteri - Carcinoma and Carcinosarcoma, AJCC 8th Edition - Pathologic: Stage I (pT1, pN0, cM0) - Signed by Artis Delay, MD on 06/09/2018   06/15/2018 Procedure   Placement of single lumen port a cath via right internal jugular vein. The catheter tip lies at the cavo-atrial junction. A power injectable port a cath was placed and is ready for immediate use.     06/20/2018 - 10/26/2018 Chemotherapy   The patient had carboplatin and Taxol x 2 cycles, Cycle 3 Taxol at 50%, then cycle 4-6 without Taxol, carboplatin only   06/25/2018 - 06/27/2018 Hospital Admission   She was admitted to the hospital with deydration   11/23/2018 Imaging   1. 8 mm hyperattenuating nodule along the midline vaginal cuff. Coronal imaging suggests that the hyperattenuation is related to calcification, but enhancement in this nodule cannot be  excluded. Patient is status post hysterectomy in the interval since prior CT. While this finding may be related to prior surgery, close follow-up recommended. 2. Otherwise no evidence for metastatic disease in the abdomen or pelvis. 3. Large hiatal hernia with more than 50% of the stomach herniated into the lower chest, stable. 4.  Aortic Atherosclerois (ICD10-170.0)   12/01/2018 Procedure   Successful removal of implanted Port-A-Cath    She noted 3 weeks of postmenopausal bleeding.  On 03/30/2018 she reports this was quite heavy with cramps.  [place] new to the [place] living in a retirement community [name] broke and she was unable to find a provider in a timely manner.  She was referred by the retirement center to an urgent care center in [facility] who then triaged her to see Dr. Erin Fulling.  Given the patient's symptoms an endometrial biopsy was performed along with a pelvic ultrasound.  Documented exam by Dr. Erin Fulling states that there was a lesion at 6:00 on the cervix which was biopsied.  In addition to the endometrial biopsy being performed.  The endometrial biopsy revealed high-grade carcinoma with a comment stating the majority appears to be FIGO grade 3 endometrioid with scattered foci suggestive of serous phenotype.  The cervix revealed unremarkable endocervical glandular and squamous mucosa there were detached fragments of high-grade carcinoma similar to the endometrial biopsy tissue.  Ultrasound revealed a uterus 6.6 x 3 x 3.7 cm with no fibroids endometrium 1.6 cm right ovary normal left ovary not visualized.   The patient is retired Orthoptist.  She states her last Pap smear was in March 2019 in [place].  She states she got a phone call from that office that her Pap smear was normal.   A CT scan was performed preop and noted below.  In summary no evidence of extrauterine disease.   The patient underwent surgical staging with a robotic assisted total hysterectomy BSO  sentinel lymph node biopsy on May 03, 2018.  Due to her symptoms of abdominal bloating, elevated Ca1 25, and her daughter's history of Lynch syndrome her tinea washings were obtained.  Intraoperative findings were otherwise unremarkable and she had no gross extrauterine disease within normal omentum diaphragms and peritoneal surfaces, 6 cm normal-appearing uterus, normal tubes and ovaries, no suspicious nodes.  The pathology revealed positive malignant cells consistent with metastatic adenocarcinoma in the peritoneal washings.  The histopathology report revealed a FIGO grade 1 endometrioid endometrial adenocarcinoma measuring 2.2 cm and invading less than one half of the myometrium (3 of 12 mm), with no LVSI, and 5- sentinel lymph nodes, with normal cervix and adnexa.  Given the finding of positive washings, the fallopian tubes and ovaries were subsequently submitted for complete serial sectioning and no occult ovarian or fallopian tube carcinoma was identified.  MSI testing showed high instability, and mismatch repair protein IHC was abnormal with loss of nuclear expression of MSH 6.   She was subsequently seen by the genetics counselors on 05/18/2018 who drew blood for genetic screening including Lynch syndrome which was positive (mutation in MSH6).   G iven the presence of positive washings and the preoperative biopsy of high grade cancer, she was felt to represent high risk for recurrence, and therefore adjuvant chemotherapy with carboplatin and paclitaxel was recommended. She received chemotherapy between June 20, 2018 and October 26, 2018.  Chemotherapy was somewhat poorly tolerated by the patient who had severe bone pain, and issues with diabetes and dehydration.  She received 2 cycles of carboplatin with paclitaxel, a third cycle with Taxol at 50%, and then cycle 4-6 without Taxol carboplatin as a single agent.   Post treatment imaging on Nov 23, 2018 revealed an 8 mm hyperattenuating nodule  along the midline vaginal cuff this is well-circumscribed.  It appeared related to calcification.  Otherwise no evidence of metastatic disease in the abdomen and pelvis.   A follow-up PET/CT was ordered and performed on 02/23/2021 reassess the area at the vaginal cuff that had previously shown some nodularity and avidity.  On this follow-up PET scan there was no metabolic evidence of recurrent metastatic disease.  There is a stable benign postsurgical change at the hysterectomy margin that had calcified.  No other new disease was visualized.  The PET does show evidence of coronary artery disease and atherosclerosis.   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 She was started on ASA 81 mg. She had a CVA vs TIA in 07/2021.  Interval History: Had a fall and broke her leg and injured her left arm.  This delayed her colonoscopy.  She then was diagnosed with A-fib, started on Eliquis.  She underwent colonoscopy in 07/2022. Diverticula were noted in the sigmoid and descending colon. Flat polyps were found in the rectum, descending and transverse colon. Several polyps were removed and c/w tubular adenomas.  She denies any abdominal or pelvic pain.  Denies vaginal bleeding or discharge.  Reports baseline bowel function.  Bladder symptoms have improved significantly.  Continues to have hot flashes although less.  Past Medical/Surgical History: Past Medical History:  Diagnosis Date   Allergy    Anxiety    on meds   Arthritis    on PRN meds   Cataract    bilateral cat. ext. with lens implant- in [place]   Diabetes mellitus without complication    diet controlled. States always has borderline HgbA1C  Endometrial cancer    endometrial CA-2019   Family history of colon cancer    daughter 28 colon CA   Family history of genetic mutation for hereditary nonpolyposis colorectal cancer ([facility]CC)    GERD (gastroesophageal reflux disease)    on meds   High  cholesterol    on meds   History of kidney stones    years    Hypertension    on meds   Neuromuscular disorder    Neuropathy    Sensation of pressure in bladder area    Stroke 07/17/2019   07/2021 (started on Eliquis in 03/2022)   TIA (transient ischemic attack) 07/2021   Uterine cramping     Past Surgical History:  Procedure Laterality Date   burnt the nerves  Right 04/29/2018   in right knee    burnt the nerves  Left 04/22/2018   left knee nerve    COLONOSCOPY  12/2018   [place]- 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   cystoscopy kidney stone   1985   HYSTERECTOMY ABDOMINAL WITH SALPINGO-OOPHORECTOMY  2019   IR CT HEAD LTD  07/18/2019   IR IMAGING GUIDED PORT INSERTION  06/15/2018   IR PERCUTANEOUS ART THROMBECTOMY/INFUSION INTRACRANIAL INC DIAG ANGIO  07/18/2019   IR REMOVAL TUN ACCESS W/ PORT W/O FL MOD SED  12/01/2018   LOOP RECORDER INSERTION N/A 07/19/2019   Procedure: LOOP RECORDER INSERTION;  Surgeon: Regan Lemming, MD;  Location: [facility];  Service: Cardiovascular;  Laterality: N/A;   POLYPECTOMY     RADIOLOGY WITH ANESTHESIA N/A 07/17/2019   Procedure: IR WITH ANESTHESIA;  Surgeon: Julieanne Cotton, MD;  Location: [facility] OR;  Service: Radiology;  Laterality: N/A;   REPLACEMENT TOTAL KNEE BILATERAL Bilateral 2018   Right x 2. 2019 undergoing intermittent injection "RFA" procedures with Dr. Petra Kuba [facility]   REVISION TOTAL KNEE ARTHROPLASTY Right 03/2017   ROBOTIC ASSISTED TOTAL HYSTERECTOMY WITH BILATERAL SALPINGO OOPHERECTOMY N/A 05/03/2018   Procedure: XI ROBOTIC ASSISTED TOTAL HYSTERECTOMY WITH BILATERAL SALPINGO OOPHORECTOMY;  Surgeon: Adolphus Birchwood, MD;  Location: [facility] OR[facility];  Service: Gynecology;  Laterality: N/A;   SENTINEL NODE BIOPSY N/A 05/03/2018   Procedure: SENTINEL NODE BIOPSY;  Surgeon: Adolphus Birchwood, MD;  Location: [facility];  Service: Gynecology;  Laterality: N/A;   TONSILLECTOMY AND ADENOIDECTOMY     TUBAL LIGATION      Family  History  Adopted: Yes  Problem Relation Age of Onset   Colon cancer Daughter 50       Lynch syndrome - MSH6 +   Colon polyps Daughter 13   Healthy Son    Esophageal cancer Neg Hx    Rectal cancer Neg Hx    Stomach cancer Neg Hx     Social History   Socioeconomic History   Marital status: Divorced    Spouse name: Not on file   Number of children: 2   Years of education: Not on file   Highest education level: Not on file  Occupational History   Occupation: retired Engineer, civil (consulting)  Tobacco Use   Smoking status: Every Day    Packs/day: 0.25    Years: 40.00    Additional pack years: 0.00    Total pack years: 10.00    Types: Cigarettes   Smokeless tobacco: Never   Tobacco comments:    4-5 loose cigarettes per day , over 20 year hx of smoking   Vaping Use   Vaping Use: Never used  Substance and Sexual Activity   Alcohol  use: Yes    Alcohol/week: 14.0 standard drinks of alcohol    Types: 14 Glasses of wine per week    Comment: about 2 glasses wine daily.    Drug use: Never   Sexual activity: Not Currently    Birth control/protection: None  Other Topics Concern   Not on file  Social History Narrative   Lives alone   Social Determinants of Health   Financial Resource Strain: Not on file  Food Insecurity: Not on file  Transportation Needs: Not on file  Physical Activity: Not on file  Stress: Not on file  Social Connections: Not on file    Current Medications:  Current Outpatient Medications:    acetaminophen (TYLENOL) 500 MG tablet, Take 1,000 mg by mouth once as needed for mild pain or moderate pain. Extra strength, Disp: , Rfl:    amitriptyline (ELAVIL) 10 MG tablet, Take 1 tablet (10 mg total) by mouth at bedtime., Disp: 90 tablet, Rfl: 3   amitriptyline (ELAVIL) 25 MG tablet, Take 1 tablet (25 mg total) by mouth at bedtime., Disp: 90 tablet, Rfl: 3   amoxicillin (AMOXIL) 500 MG capsule, Take 2,000 mg by mouth once as needed (dental work)., Disp: , Rfl:    apixaban  (ELIQUIS) 5 MG TABS tablet, TAKE 1 TABLET BY MOUTH TWICE A DAY, Disp: 60 tablet, Rfl: 5   atenolol (TENORMIN) 50 MG tablet, Take 75 mg by mouth daily., Disp: , Rfl:    atorvastatin (LIPITOR) 80 MG tablet, Take 80 mg by mouth daily., Disp: , Rfl:    Biotin 5000 MCG TABS, Take 5,000 mcg by mouth daily., Disp: , Rfl:    clotrimazole (LOTRIMIN) 1 % cream, Apply 1 Application topically as needed., Disp: , Rfl:    Coenzyme Q10 (CO Q 10 PO), Take 200 mg by mouth daily., Disp: , Rfl:    cyclobenzaprine (FLEXERIL) 5 MG tablet, Take 5 mg by mouth 3 (three) times daily as needed for muscle spasms., Disp: , Rfl:    hydrocortisone 2.5 % cream, Apply 1 Application topically as needed., Disp: , Rfl:    Lancets (ONETOUCH DELICA PLUS LANCET33G) MISC, Apply topically daily., Disp: , Rfl:    lisinopril (ZESTRIL) 10 MG tablet, Take 10 mg by mouth daily., Disp: , Rfl:    metFORMIN (GLUCOPHAGE) 500 MG tablet, TAKE 1 TABLET BY MOUTH TWICE A DAY WITH MEALS, Disp: 60 tablet, Rfl: 1   Omega-3 Fatty Acids (FISH OIL PO), Take 1,400 mg by mouth daily., Disp: , Rfl:    omeprazole (PRILOSEC) 40 MG capsule, Take 1 capsule (40 mg total) by mouth daily., Disp: 90 capsule, Rfl: 3   ONETOUCH ULTRA test strip, USE TO CHECK BLOOD SUGAR ONCE DAILY E11.69, Disp: , Rfl:    Propylene Glycol 0.6 % SOLN, Place 2 drops into both eyes 4 (four) times daily., Disp: , Rfl:    senna-docusate (SENOKOT-S) 8.6-50 MG tablet, Take 1 tablet by mouth as needed for mild constipation., Disp: , Rfl:   Review of Systems: + Hot flashes, easy bruising/bleeding, anxiety. Denies appetite changes, fevers, chills, fatigue, unexplained weight changes. Denies hearing loss, neck lumps or masses, mouth sores, ringing in ears or voice changes. Denies cough or wheezing.  Denies shortness of breath. Denies chest pain or palpitations. Denies leg swelling. Denies abdominal distention, pain, blood in stools, constipation, diarrhea, nausea, vomiting, or early  satiety. Denies pain with intercourse, dysuria, frequency, hematuria or incontinence. Denies pelvic pain, vaginal bleeding or vaginal discharge.   Denies joint pain, back  pain or muscle pain/cramps. Denies itching, rash, or wounds. Denies dizziness, headaches, numbness or seizures. Denies swollen lymph nodes or glands. Denies depression, confusion, or decreased concentration.  Physical Exam: BP 126/74 (BP Location: Left Arm, Patient Position: Sitting)   Pulse 75   Temp 98.4 F (36.9 C) (Oral)   Wt 173 lb 12.8 oz (78.8 kg)   SpO2 99%   BMI 29.83 kg/m  General: Alert, oriented, no acute distress. HEENT: Normocephalic, atraumatic, sclera anicteric. Chest: Clear to auscultation bilaterally.  Port site clean. Cardiovascular: Regular rate and rhythm, no murmurs. Abdomen: soft, nontender.  Normoactive bowel sounds.  No masses or hepatosplenomegaly appreciated.  Well-healed incisions. Extremities: Grossly normal range of motion.  Warm, well perfused.  No edema bilaterally. Skin: No rashes or lesions noted. Lymphatics: No cervical, supraclavicular, or inguinal adenopathy. GU: Normal appearing external genitalia without erythema, excoriation, or lesions.  Speculum exam reveals mildly atrophic vaginal mucosa, no lesions seen.  Cuff intact.  Bimanual exam reveals no nodularity or masses.  Rectovaginal exam confirms findings.  Laboratory & Radiologic Studies: None new  Assessment & Plan: Jordan Martinez is a 72 y.o. woman with a history of Stage IA grade 3 endometrial cancer with + washings who presents for surveillance visit. MSI high, MMR abnormal (loss of MSH 6). 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  S/p 6 cycles platinum and taxane chemotherapy completed April 22nd, 2020.  Complete clinical response, no evidence of disease on exam.  Post-treatment imaging revealed nodule at vaginal cuff (8mm). No concerning findings on examination and stable/benign appearing on follow-up imaging.  Last  imaging was in 02/2021 (PET) and negative for evidence of metastatic disease.   Patient is doing well and is NED on exam today.   Neri symptoms have significantly improved since her last visit with me.   We reviewed signs and symptoms that would be concerning for recurrence.  We will continue with surveillance visits every 6 months until April 2025.  I stressed the importance of calling to see me sooner if she develops any new and concerning symptoms.    20 minutes of total time was spent for this patient encounter, including preparation, face-to-face counseling with the patient and coordination of care, and documentation of the encounter.  Jordan GarnetKatherine Billyjack Trompeter, MD  Division of Gynecologic Oncology  Department of Obstetrics and Gynecology  [facility]

## 2022-10-09 NOTE — Patient Instructions (Signed)
It was good to see you today.  I do not see or feel any evidence of cancer recurrence on your exam.  I will see you for follow-up in 6 months.  As always, if you develop any new and concerning symptoms before your next visit, please call to see me sooner.   

## 2022-10-26 ENCOUNTER — Ambulatory Visit (INDEPENDENT_AMBULATORY_CARE_PROVIDER_SITE_OTHER): Payer: Medicare Other

## 2022-10-26 DIAGNOSIS — I63412 Cerebral infarction due to embolism of left middle cerebral artery: Secondary | ICD-10-CM

## 2022-10-27 LAB — CUP PACEART REMOTE DEVICE CHECK
Date Time Interrogation Session: 20240419230830
Implantable Pulse Generator Implant Date: 20210113

## 2022-11-03 DIAGNOSIS — M2141 Flat foot [pes planus] (acquired), right foot: Secondary | ICD-10-CM | POA: Insufficient documentation

## 2022-11-05 NOTE — Progress Notes (Signed)
Carelink Summary Report / Loop Recorder 

## 2022-11-25 ENCOUNTER — Ambulatory Visit (INDEPENDENT_AMBULATORY_CARE_PROVIDER_SITE_OTHER): Payer: Medicare Other

## 2022-11-25 DIAGNOSIS — I63412 Cerebral infarction due to embolism of left middle cerebral artery: Secondary | ICD-10-CM

## 2022-11-26 LAB — CUP PACEART REMOTE DEVICE CHECK
Date Time Interrogation Session: 20240522231042
Implantable Pulse Generator Implant Date: 20210113

## 2022-12-01 DIAGNOSIS — M19071 Primary osteoarthritis, right ankle and foot: Secondary | ICD-10-CM | POA: Insufficient documentation

## 2022-12-02 NOTE — Progress Notes (Signed)
Carelink Summary Report / Loop Recorder 

## 2022-12-17 NOTE — Progress Notes (Signed)
Carelink Summary Report / Loop Recorder 

## 2022-12-24 DIAGNOSIS — H5203 Hypermetropia, bilateral: Secondary | ICD-10-CM | POA: Insufficient documentation

## 2022-12-24 DIAGNOSIS — H04123 Dry eye syndrome of bilateral lacrimal glands: Secondary | ICD-10-CM | POA: Insufficient documentation

## 2022-12-24 DIAGNOSIS — Z961 Presence of intraocular lens: Secondary | ICD-10-CM | POA: Insufficient documentation

## 2022-12-28 ENCOUNTER — Ambulatory Visit: Payer: Medicare Other

## 2022-12-28 DIAGNOSIS — I63412 Cerebral infarction due to embolism of left middle cerebral artery: Secondary | ICD-10-CM | POA: Diagnosis not present

## 2022-12-29 LAB — CUP PACEART REMOTE DEVICE CHECK
Date Time Interrogation Session: 20240624230517
Implantable Pulse Generator Implant Date: 20210113

## 2023-01-08 ENCOUNTER — Telehealth: Payer: Self-pay | Admitting: Cardiology

## 2023-01-08 MED ORDER — APIXABAN 5 MG PO TABS: 5.0000 mg | ORAL_TABLET | Freq: Two times a day (BID) | ORAL | 9 refills | Status: DC

## 2023-01-08 NOTE — Telephone Encounter (Signed)
Pt c/o medication issue:  1. Name of Medication: apixaban (ELIQUIS) 5 MG TABS tablet   2. How are you currently taking this medication (dosage and times per day)? As prescribed   3. Are you having a reaction (difficulty breathing--STAT)?   4. What is your medication issue? Patient is calling to see if this medication could be filled by Dr. Elberta Fortis due to the current prescriber retiring. She states she only has a few left and would need this today.

## 2023-01-08 NOTE — Telephone Encounter (Signed)
Pt aware ok for [facility] to refill. Refills sent.    She reports she is paying $90/month for her Eliquis, all year.  Aware I am going to discuss this further w/ our prior auth nurse and get back with her, but it may be several weeks.  Pt agreeable.

## 2023-01-11 NOTE — Telephone Encounter (Signed)
Spoke to [place] this morning. Attempted to reach pt, no answer. Will reach back out when return to office to inform pt of recommendation/s.

## 2023-01-13 DIAGNOSIS — M2012 Hallux valgus (acquired), left foot: Secondary | ICD-10-CM | POA: Insufficient documentation

## 2023-01-18 NOTE — Progress Notes (Signed)
 [facility] Summary Report / Loop Recorder 

## 2023-01-22 LAB — LAB REPORT - SCANNED
A1c: 7.7
EGFR: 83

## 2023-01-26 NOTE — Telephone Encounter (Signed)
Left message to call back  

## 2023-01-27 NOTE — Telephone Encounter (Signed)
Eliquis patient assistance number given to patient. Also aware she may need to speak with medicare as well. Pt appreciates my help/follow  up with this.

## 2023-01-27 NOTE — Telephone Encounter (Signed)
Pt returning nurse's call. Please advise

## 2023-02-01 ENCOUNTER — Ambulatory Visit (INDEPENDENT_AMBULATORY_CARE_PROVIDER_SITE_OTHER): Payer: Medicare Other

## 2023-02-01 DIAGNOSIS — I63412 Cerebral infarction due to embolism of left middle cerebral artery: Secondary | ICD-10-CM

## 2023-02-01 LAB — CUP PACEART REMOTE DEVICE CHECK
Date Time Interrogation Session: 20240727230327
Implantable Pulse Generator Implant Date: 20210113

## 2023-02-14 ENCOUNTER — Encounter: Payer: Self-pay | Admitting: Adult Health

## 2023-02-14 ENCOUNTER — Encounter: Payer: Self-pay | Admitting: Cardiology

## 2023-02-18 ENCOUNTER — Ambulatory Visit (INDEPENDENT_AMBULATORY_CARE_PROVIDER_SITE_OTHER): Payer: Medicare Other | Admitting: Adult Health

## 2023-02-18 ENCOUNTER — Encounter: Payer: Self-pay | Admitting: Adult Health

## 2023-02-18 VITALS — BP 120/72 | HR 70 | Ht 64.0 in | Wt 170.0 lb

## 2023-02-18 DIAGNOSIS — G43709 Chronic migraine without aura, not intractable, without status migrainosus: Secondary | ICD-10-CM | POA: Diagnosis not present

## 2023-02-18 DIAGNOSIS — I639 Cerebral infarction, unspecified: Secondary | ICD-10-CM

## 2023-02-18 MED ORDER — AMITRIPTYLINE HCL 10 MG PO TABS: 10.0000 mg | ORAL_TABLET | Freq: Every day | ORAL | 3 refills | Status: DC

## 2023-02-18 MED ORDER — AMITRIPTYLINE HCL 25 MG PO TABS: 25.0000 mg | ORAL_TABLET | Freq: Every day | ORAL | 3 refills | Status: DC

## 2023-02-18 NOTE — Patient Instructions (Addendum)
Continue amitriptyline 35mg  total at bedtime for headaches  Continue  Eliquis   and atorvastatin  for secondary stroke prevention  Continue to follow up with PCP regarding cholesterol, blood pressure and diabetes management  Maintain strict control of hypertension with blood pressure goal below 130/90, diabetes with hemoglobin A1c goal below 7.0 % and cholesterol with LDL cholesterol (bad cholesterol) goal below 70 mg/dL.   Signs of a Stroke? Follow the BEFAST method:  Balance Watch for a sudden loss of balance, trouble with coordination or vertigo Eyes Is there a sudden loss of vision in one or both eyes? Or double vision?  Face: Ask the person to smile. Does one side of the face droop or is it numb?  Arms: Ask the person to raise both arms. Does one arm drift downward? Is there weakness or numbness of a leg? Speech: Ask the person to repeat a simple phrase. Does the speech sound slurred/strange? Is the person confused ? Time: If you observe any of these signs, call 911.     Followup in the future with me in 6 months or call earlier if needed      Thank you for coming to see [place] at [facility]. I hope we have been able to provide you high quality care today.  You may receive a patient satisfaction survey over the next few weeks. We would appreciate your feedback and comments so that we may continue to improve ourselves and the health of our patients.      Cholesterol Content in Foods Cholesterol is a waxy, fat-like substance that helps to carry fat in the blood. The body needs cholesterol in small amounts, but too much cholesterol can cause damage to the arteries and heart. What foods have cholesterol?  Cholesterol is found in animal-based foods, such as meat, seafood, and dairy. Generally, low-fat dairy and lean meats have less cholesterol than full-fat dairy and fatty meats. The milligrams of cholesterol per serving (mg per serving) of common  cholesterol-containing foods are listed below. Meats and other proteins Egg -- one large whole egg has 186 mg. Veal shank -- 4 oz (113 g) has 141 mg. Lean ground [place] (93% lean) -- 4 oz (113 g) has 118 mg. Fat-trimmed lamb loin -- 4 oz (113 g) has 106 mg. Lean ground beef (90% lean) -- 4 oz (113 g) has 100 mg. Lobster -- 3.5 oz (99 g) has 90 mg. Pork loin chops -- 4 oz (113 g) has 86 mg. Canned salmon -- 3.5 oz (99 g) has 83 mg. Fat-trimmed beef top loin -- 4 oz (113 g) has 78 mg. Frankfurter -- 1 frank (3.5 oz or 99 g) has 77 mg. Crab -- 3.5 oz (99 g) has 71 mg. Roasted chicken without skin, white meat -- 4 oz (113 g) has 66 mg. Light bologna -- 2 oz (57 g) has 45 mg. Deli-cut [place] -- 2 oz (57 g) has 31 mg. Canned tuna -- 3.5 oz (99 g) has 31 mg. Tomasa Blase -- 1 oz (28 g) has 29 mg. Oysters and mussels (raw) -- 3.5 oz (99 g) has 25 mg. Mackerel -- 1 oz (28 g) has 22 mg. Trout -- 1 oz (28 g) has 20 mg. Pork sausage -- 1 link (1 oz or 28 g) has 17 mg. Salmon -- 1 oz (28 g) has 16 mg. Tilapia -- 1 oz (28 g) has 14 mg. Dairy Soft-serve ice cream --  cup (4 oz or 86 g) has 103 mg. Whole-milk  yogurt -- 1 cup (8 oz or 245 g) has 29 mg. Cheddar cheese -- 1 oz (28 g) has 28 mg. American cheese -- 1 oz (28 g) has 28 mg. Whole milk -- 1 cup (8 oz or 250 mL) has 23 mg. 2% milk -- 1 cup (8 oz or 250 mL) has 18 mg. Cream cheese -- 1 tablespoon (Tbsp) (14.5 g) has 15 mg. Cottage cheese --  cup (4 oz or 113 g) has 14 mg. Low-fat (1%) milk -- 1 cup (8 oz or 250 mL) has 10 mg. Sour cream -- 1 Tbsp (12 g) has 8.5 mg. Low-fat yogurt -- 1 cup (8 oz or 245 g) has 8 mg. Nonfat Greek yogurt -- 1 cup (8 oz or 228 g) has 7 mg. Half-and-half cream -- 1 Tbsp (15 mL) has 5 mg. Fats and oils Cod liver oil -- 1 tablespoon (Tbsp) (13.6 g) has 82 mg. Butter -- 1 Tbsp (14 g) has 15 mg. Lard -- 1 Tbsp (12.8 g) has 14 mg. Bacon grease -- 1 Tbsp (12.9 g) has 14 mg. Mayonnaise -- 1 Tbsp (13.8 g) has 5-10  mg. Margarine -- 1 Tbsp (14 g) has 3-10 mg. The items listed above may not be a complete list of foods with cholesterol. Exact amounts of cholesterol in these foods may vary depending on specific ingredients and brands. Contact a dietitian for more information. What foods do not have cholesterol? Most plant-based foods do not have cholesterol unless you combine them with a food that has cholesterol. Foods without cholesterol include: Grains and cereals. Vegetables. Fruits. Vegetable oils, such as olive, canola, and sunflower oil. Legumes, such as peas, beans, and lentils. Nuts and seeds. Egg whites. The items listed above may not be a complete list of foods that do not have cholesterol. Contact a dietitian for more information. Summary The body needs cholesterol in small amounts, but too much cholesterol can cause damage to the arteries and heart. Cholesterol is found in animal-based foods, such as meat, seafood, and dairy. Generally, low-fat dairy and lean meats have less cholesterol than full-fat dairy and fatty meats. This information is not intended to replace advice given to you by your health care provider. Make sure you discuss any questions you have with your health care provider. Document Revised: 11/01/2020 Document Reviewed: 11/01/2020 Elsevier Patient Education  2024 [employer].

## 2023-02-18 NOTE — Progress Notes (Signed)
[facility] [address] [place] [address] [phone]       STROKE FOLLOW UP NOTE  Jordan Martinez Date of Birth:  [birth year] Medical Record Number:  [id number]   Reason for Referral: Stroke follow-up    CHIEF COMPLAINT:  Chief Complaint  Patient presents with   Follow-up    Rm 3, here alone Pt is here for stroke follow up. Pt states she is doing well overall, no new concerns.       HPI:  Update 02/18/2023 JM: Patient returns for follow-up visit.  Stable from stroke standpoint without new stroke/TIA symptoms. Reports resolution of prior speech difficulties. Compliant on Eliquis and atorvastatin.  Routinely follows with PCP and cardiology for stroke risk factor management.  Remains on amitriptyline for facial paresthesias and migraine headaches. No recent headache occurrence. Being followed by ortho for bilateral hip issues receiving hip injections under xray, reports benefit.     History provided for reference purposes only Update 05/20/2022 JM: Patient returns for 45-month stroke follow-up.  Overall stable without new stroke/TIA symptoms.  Reports residual occasional word finding difficulty with some improvement noted since prior visit.  Remains on amitriptyline 10mg  evening and 25mg  nightly for facial paresthesias and occasional migraine headaches, denies any recent issues with facial paresthesias or migraine headaches.  Does report recent mild generalized headache but resolved after short duration without intervention.  She questions combining amitriptyline dosages to just bedtime alone.  Loop recorder showed evidence of A-fib on 9/10, Eliquis 5 mg BID initiated for CHA2DS2-VASc score of 5.  Plavix discontinued.  Closely followed by cardiology.  She has remained on Eliquis as well as atorvastatin without side effects.  Blood pressure well controlled.  Did have a fall 10/20 which resulted in fracture of her left foot and sprain of her left wrist,  she is being closely followed by orthopedics  Update 11/13/2021 JM: Patient returns for 84-month follow-up after prior visit with Dr. Pearlean Brownie for hospital follow-up.  She is accompanied by her friend, Ed.  She has been doing well since that time without any new stroke/TIA symptoms.  She has made great recovery since prior visit with improvement of speech and only occasional difficulty seeing correct words and occasional stuttering with increased anxiety.  She continues to have some difficulty with numbers and letters but this is also greatly improved.  She completed 8 weeks of SLP.  She routinely does sudoku and word search.  She does note continued anxiety but this has been gradually improving.  She plans on further discussing with PCP at follow-up visit next month.  She also mentions eyestrain since her stroke but denies any visual loss or double vision.  She was scheduled to see her ophthalmologist for yearly follow-up but needed to reschedule and unable to be seen until September.  Compliant on Plavix and atorvastatin, denies side effects.  Blood pressure today 130/70.  She remains on amitriptyline 10mg  at dinner and 25mg  nightly - neck numbness/paresthesias stable and occasional migraine headaches but overall well controlled.  No further concerns at this time.  Update 08/11/2021 Dr. Pearlean Brownie Patient is seen today for follow-up upon her request accompanied by her friend.  She was admitted to [facility] on 07/29/2021 following 4 days of vomiting and diarrhea and dehydration.  She developed sudden onset of speech difficulties the and evaluation showed multiple small left frontoparietal as well as left occipital embolic infarcts.  CT angiogram showed 50% proximal left ICA stenosis however carotid ultrasound only showed  1-39% bilateral stenosis.  Echocardiogram showed normal ejection fraction without cardiac source of embolism.  Telemetry monitoring did not show any significant atrial fibrillation.  LDL  cholesterol was marginally elevated at 81 mg percent.  Patient had been on aspirin and Plavix was added for 3 weeks.  She states she is doing well.  She is getting outpatient speech therapy presently.  She is still having some intermittent speech and word finding difficulties as well as some transient confusion and at times was noted trouble with writing checks she struggles with letters in spelling.  At times she has trouble cutting food holding a fork and knife.  She is concerned given trouble tying her shoelaces or feeling the gas tank.  She admits to having significant anxiety related to the problem she is having after the stroke.  She is on Lipitor 40 mg daily and tolerating it well but does have some muscle aches and pains.  She has never taken co-Q10.  She denies any episodes of numbness and paresthesias today.   Update 06/16/2021 JM:  Patient returns for follow-up unaccompanied. Right facial numbness and tingling and posterior neck pain significantly improved. Has finished physical therapy for her neck and continues exercises at home. No additional headaches. No additional transient confusion episodes - MR brain and EEG unremarkable. Denies new stroke/TIA symptoms. Remains on Atorvastatin and Aspirin without side effects and Amitriptyline 35 mg nightly for headaches and facial pain. BP today 127/81. Pt reports A1C of 6.4, unable to view in [facility], next labs in January.  Loop recorder has not shown atrial fibrillation thus far.  She plans on traveling to [place] soon to visit family (she has not been able to over the past 3 years due to medical condition).  No further concerns at this time.  Update 03/18/2021 JM: Jordan Martinez returns for 50-month follow-up unaccompanied.  She reports continued right facial numbness/tingling (more so jaw and side of neck) and posterior neck pain but overall improving currently working with PT.  Mainly symptoms only present in the evening.  She remains on amitriptyline 25 mg  nightly and has not experienced any recent headaches.  Discussed use of extra 10 mg tablet as needed for breakthrough headaches as well as worsening nerve pain as needed but forget this was discussed and has not yet trialed extra dose as she has not had any additional headaches.  She does report approximately 2 weeks ago 2 separate episodes of transient confusion - 1 while trying to pump gas and forgot steps (lasted less than 1 min) and another when she mixed up checks to be mailed with her recycled papers (as both recycling and mailbox in the same vicinity where she resides) and ended up putting her checks in the recycling.  She shortly realized what she did and was able to recover her checks and mail them appropriately.  No other associated symptoms or neurological type symptoms.  She did experience similar symptoms after her prior stroke but no reoccurrence since then. No additional events since that time.  Reports compliance on aspirin and atorvastatin.  Blood pressure today 124/66. No further concerns at this time.   Update 01/14/2021 JM: Jordan Martinez returns for sooner follow-up visit unaccompanied to discuss imaging and further treatment options after prior visit with Dr. Pearlean Brownie 1 month ago for intermittent right lower face and neck paresthesias that first presented around 10/2020. MR brain w/wo 12/29/2020 showed chronic left temporal and left insular ischemic infarcts but no evidence of acute findings. MR cervical  12/29/2020 degenerative changes with mild narrowing C4, C5 and C6 and severe left foraminal stenosis at C3-4 and severe bilateral foraminal stenosis C4-5.  She continues to experience symptoms without new characteristics or features or worsening.  Typically worse in the evening associated with increase neck pain and stiffness.  Denies any radiating symptoms into her arms.  She has remained on amitriptyline 25 mg nightly for migraine prophylaxis with occasional 10 mg dose as needed.  Stable from stroke  standpoint without new or reoccurring stroke/TIA symptoms.  Compliant on aspirin and atorvastatin without associated side effects.  Blood pressure today 134/84.  Loop recorder negative for atrial fibrillation thus far.  Update 12/18/2020 Dr. Pearlean Brownie: Jordan Martinez is a pleasant 72 year old Caucasian lady seen today for office consultation visit right face and neck numbness.  History is obtained from the patient and review of referral notes and I personally reviewed electronic medical records in Care Everywhere and pertinent imaging films in PACS.  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  Patient has followed up for stroke follow-up in the clinic and was last seen by [facility] nurse practitioner on 10/01/2020.  Patient has a new complaint of tingling and numbness involving the outer aspect of the lower right face as well as neck.  This began about 2 months ago.  Initially this feeling would go down into the right arm as well as forearm.  This lasted only few days and since then she has been having intermittent tingling sensation on the outer aspect of the right lower face as well as the neck.  This may occur a couple of times a day.  Lasts only 1 to 2 minutes.  There are no specific triggers.  This is annoying but not very bothersome.  Occasionally she has discomfort and pain in the base of her neck.  She denies any true radicular pain or severe neck pain.  There is no history of neck injury, motor vehicle accident.  She recently saw her orthopedic physician and [place] who did x-rays of the cervical spine which showed mild disc degenerative changes.  Patient also has a history of headaches following her stroke and was prescribed Elavil 25 mg at night which seems to help she still  has headaches about couple of times a week but they are tolerable and not as bothersome.  She is currently on aspirin for stroke prevention and is having mild bruising but no major bleeding episodes.  He is tolerating Lipitor well with only occasional leg cramps.  Blood pressure is usually well controlled at home the today it is elevated in office at 159/85.  She plans to see a primary care physician later this month and have follow-up lipid profile and hemoglobin A1c checked.  She is extremely anxious that she may have cancer and worries a lot about this.  She has a daughter in [place] who is undergoing surgery this week and patient had to cancel her trip and she wants to finish her work-up prior to going there.  Update 10/01/2020 JM: Jordan Martinez returns for stroke and headache 43-month follow-up accompanied by her friend, Napoleon Form from a stroke standpoint without new or recurring stroke/TIA symptoms Reports compliance on aspirin and atorvastatin -denies associated side effects Blood pressure today 135/83 Loop recorder has not  shown atrial fibrillation thus far  Headaches have been stable only experiencing one migraine since prior visit. She took an additional amitriptyline 25 mg tablet with resolution.  She will experience very mild headaches approximately 2-3 times a week typically after dinner prior to schedule amitriptyline dosage but will quickly resolve after taking amitriptyline.  Ramon Dredge reports some difficulty with memory and anxiety She will have difficulty remembering numbers such as her address or cell phone password -this is been consistent since her stroke She feels as though her anxiety is greatly improving where she is not consistently fearful of having additional strokes and is sleeping better at night  She is being followed by pain clinic for b/l knee pain recently receiving COOLIEF therapy by Dr. Larence Penning but denies much benefit.  She has scheduled follow-up visit with him tomorrow  for further discussion  No further concerns at this time  Update 04/01/2020 JM patient returns for stroke f/u. Stable from stroke standpoint since prior visit without residual deficits. Denies new stroke/TIA symptoms. Chronic headaches stable on amitriptyline 50mg  nightly but does report morning grogginess.  Remains on aspirin 81mg  daily and atorvastatin without side effects. Blood pressure today 138/78. Loop recorder has not shown atrial fibrillation thus far.  She continues to be followed by pain management for bilateral knee osteoarthritis.  Does report occasional anxiety due to medical conditions but overall stable.  No further concerns at this time.  Update 11/29/2019 JM: Ms. Jansson is being seen for follow-up regarding left MCA stroke in 07/2018.  She has been doing well from a stroke standpoint without new or reoccurring stroke/TIA symptoms.  Underlying chronic headaches worsened post stroke and initiated amitriptyline with eventual increase to 50mg  with great benefit. Reports mild side effects such as mild fatigue the following day and mild skin photosensitivity. She does endorse improvement of anxiety as well with use of amitriptyline. Continues on aspirin and atorvastatin for secondary stroke prevention.  Blood pressure today 118/62.  Loop recorder has not shown atrial fibrillation thus far.  No further concerns at this time.  Initial visit 08/23/2019 JM: Ms. Kresl is a 72 year old female who is being seen today for hospital follow up.  She is a retired Financial planner.  Recovered well from a stroke standpoint without residual speech deficits.  Prior history of frontal headaches worsened with eyestrain and was told due to prior chemo treatments.  Headache intensity worsened post stroke gradually returning to baseline but continues to experience frequently.  At times she also experienced cloudiness but this has been unchanged.  She will be seeing her ophthalmologist next week for 1  year follow-up.  Describes headaches as a pressure sensation in frontal region and behind eyes bilaterally associated with photophobia and phonophobia but denies nausea/vomiting.  She will occasionally experience small flicker of light in her periphery prior to headache onset and is unsure if headaches are worse when she experiences visual aura.  She has not previously been treated for headaches or been diagnosed with migraine headaches.  She also endorses increased anxiety due to fear of recurrent stroke.  She has completed 3 weeks DAPT and continues on aspirin alone without bleeding or bruising.  Continues on atorvastatin 40 mg daily without myalgias.  Blood pressure today satisfactory at 130/70.  She has not had follow-up with IR as recommended post revascularization during admission.  Loop recorder is not shown atrial fibrillation thus far.  No further concerns at this time.  Hospital admission 07/17/2019: Jordan Martinez is a  72 y.o. female with history of diabetes, endometrial cancer, HNPCC, hyperlipidemia, hypertension who presented on 07/17/2019 with aphasia.  Evaluated by stroke team and  Dr. Roda Shutters with stroke work-up revealing left MCA infarct due to left M2 occlusion s/p TPA and IR with TICI 3 revascularization of distal left M2 with resultant small SAH left sylvian fissure, infarct embolic secondary to unknown source.  2D echo unremarkable.  LE venous Doppler negative.  Loop recorder placed to rule out A. fib as potential stroke etiology.  Recommended DAPT for 3 weeks and aspirin alone.  Initiated atorvastatin 40 mg daily with LDL 120.  HTN stable.  Controlled DM with A1c 7.0.  Current tobacco use with smoking cessation counseling provided.  Other stroke risk factors include advanced age, EtOH use and obesity but no prior history of stroke.  Discharged back to [facility] with recommendation of physical therapy.  Stroke: L MCA infarct due to left M2 occlusion s/p tPA and IR with resultant small SAH left  sylvian fissure, infarct embolic secondary to unknown source Code Stroke CT head hyperdense L MCA. ASPECTS 10.    CTA head & neck L M2 MCA ELVO. L ICA 50% stenosis, mild L siphon plaque. Mild atherosclerosis throughout CT perfusion 7 mL core, 53 mL penumbra L MCA territory  Cerebral angio TICI3 revascularization distal L M2  F/u CT mild SAH L sylvian fissure. No infarct  MRI patchy small L MCA territory infarct (L insula, L frontal operculum, L temporal lobe). Small SAHD L sylvian fissure stable. MRA Unremarkable  2D Echo EF 65-70%. No source of embolus  LE venous doppler No DVT Loop recorder placed 1/13 to look for AF as source of stroke ([place]) LDL 120 HgbA1c 7.0 aspirin 81 mg daily prior to admission, will start aspirin 81 and plavix 75 at d/c. Continue DAPT x 3 weeks then aspirin alone.   Therapy recommendations: continue OP PT  Disposition:  return to [facility]    ROS:   14 system review of systems performed and negative with exception of those listed in HPI  PMH:  Past Medical History:  Diagnosis Date   Allergy    Anxiety    on meds   Arthritis    on PRN meds   Cataract    bilateral cat. ext. with lens implant- in [place]   Diabetes mellitus without complication (HCC)    diet controlled. States always has borderline HgbA1C   Endometrial cancer (HCC)    endometrial CA-2019   Family history of colon cancer    daughter 53 colon CA   Family history of genetic mutation for hereditary nonpolyposis colorectal cancer (HNPCC)    GERD (gastroesophageal reflux disease)    on meds   High cholesterol    on meds   History of kidney stones    years    Hypertension    on meds   Neuromuscular disorder (HCC)    Neuropathy    Sensation of pressure in bladder area    Stroke (HCC) 07/17/2019   07/2021 (started on Eliquis in 03/2022)   TIA (transient ischemic attack) 07/2021   Uterine cramping     PSH:  Past Surgical History:  Procedure Laterality Date   burnt the nerves   Right 04/29/2018   in right knee    burnt the nerves  Left 04/22/2018   left knee nerve    COLONOSCOPY  12/2018   [place]- 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   cystoscopy kidney stone   1985   HYSTERECTOMY ABDOMINAL  WITH SALPINGO-OOPHORECTOMY  2019   IR CT HEAD LTD  07/18/2019   IR IMAGING GUIDED PORT INSERTION  06/15/2018   IR PERCUTANEOUS ART THROMBECTOMY/INFUSION INTRACRANIAL INC DIAG ANGIO  07/18/2019   IR REMOVAL TUN ACCESS W/ PORT W/O FL MOD SED  12/01/2018   LOOP RECORDER INSERTION N/A 07/19/2019   Procedure: LOOP RECORDER INSERTION;  Surgeon: Regan Lemming, MD;  Location: [facility] INVASIVE CV LAB;  Service: Cardiovascular;  Laterality: N/A;   POLYPECTOMY     RADIOLOGY WITH ANESTHESIA N/A 07/17/2019   Procedure: IR WITH ANESTHESIA;  Surgeon: Julieanne Cotton, MD;  Location: [facility] OR;  Service: Radiology;  Laterality: N/A;   REPLACEMENT TOTAL KNEE BILATERAL Bilateral 2018   Right x 2. 2019 undergoing intermittent injection "RFA" procedures with Dr. Petra Kuba [facility]   REVISION TOTAL KNEE ARTHROPLASTY Right 03/2017   ROBOTIC ASSISTED TOTAL HYSTERECTOMY WITH BILATERAL SALPINGO OOPHERECTOMY N/A 05/03/2018   Procedure: XI ROBOTIC ASSISTED TOTAL HYSTERECTOMY WITH BILATERAL SALPINGO OOPHORECTOMY;  Surgeon: Adolphus Birchwood, MD;  Location: [facility] ORS;  Service: Gynecology;  Laterality: N/A;   SENTINEL NODE BIOPSY N/A 05/03/2018   Procedure: SENTINEL NODE BIOPSY;  Surgeon: Adolphus Birchwood, MD;  Location: [facility] ORS;  Service: Gynecology;  Laterality: N/A;   TONSILLECTOMY AND ADENOIDECTOMY     TUBAL LIGATION      Social History:  Social History   Socioeconomic History   Marital status: Divorced    Spouse name: Not on file   Number of children: 2   Years of education: Not on file   Highest education level: Not on file  Occupational History   Occupation: retired Engineer, civil (consulting)  Tobacco Use   Smoking status: Every Day    Current packs/day: 0.25    Average packs/day: 0.3 packs/day for 40.0 years  (10.0 ttl pk-yrs)    Types: Cigarettes   Smokeless tobacco: Never   Tobacco comments:    4-5 loose cigarettes per day , over 20 year hx of smoking   Vaping Use   Vaping status: Never Used  Substance and Sexual Activity   Alcohol use: Yes    Alcohol/week: 14.0 standard drinks of alcohol    Types: 14 Glasses of wine per week    Comment: about 2 glasses wine daily.    Drug use: Never   Sexual activity: Not Currently    Birth control/protection: None  Other Topics Concern   Not on file  Social History Narrative   Lives alone   Social Determinants of Health   Financial Resource Strain: Low Risk  (01/19/2022)   [facility]05/[facility]22[facility] visits prior to 09/05/2022.   Overall Financial Resource Strain (CARDIA)    Difficulty of Paying Living Expenses: Not hard at all  Food Insecurity: No Food Insecurity (01/19/2022)   [facility] prior to 09/05/2022., [facility] visits prior to 09/05/2022., [facility]   Hunger Vital Sign    Worried About Running Out of Food in the Last Year: Never true    Ran Out of Food in the Last Year: Never true  Transportation Needs: No Transportation Needs (01/19/2022)   Received from [facility] - Transportation  Physical Activity: Insufficiently Active (01/19/2022)   [facility] prior to 09/05/2022., [facility] visits prior to 09/05/2022.   Exercise Vital  Sign    Days of Exercise per Week: 3 days    Minutes of Exercise per Session: 40 min  Stress: No Stress Concern Present (01/19/2022)   [facility] visits prior to 09/05/2022., [facility] visits prior to 09/05/2022.   [employer] of  Occupational Health - Occupational Stress Questionnaire    Feeling of Stress : Only a little  Social Connections: Moderately Isolated (01/19/2022)   Received from [facility] visits prior to 09/05/2022., [facility] visits prior to 09/05/2022.   Social Connection and Isolation Panel [NHANES]    Frequency of Communication with Friends and Family: More than three times a week    Frequency of Social Gatherings with Friends and Family: Three times a week    Attends Religious Services: More than 4 times per year    Active Member of Clubs or Organizations: No    Attends Banker Meetings: Never    Marital Status: Divorced  Catering manager Violence: Not At Risk (01/19/2022)   [facility] visits prior to 09/05/2022., [facility] visits prior to 09/05/2022.   Humiliation, Afraid, Rape, and Kick questionnaire    Fear of Current or Ex-Partner: No    Emotionally Abused: No    Physically Abused: No    Sexually Abused: No    Family History:  Family History  Adopted: Yes  Problem Relation Age of Onset   Colon cancer Daughter 51       Lynch syndrome - MSH6 +   Colon polyps Daughter 50   Healthy Son    Esophageal cancer Neg Hx    Rectal cancer Neg Hx    Stomach cancer Neg Hx     Medications:   Current Outpatient Medications on File Prior to Visit  Medication Sig Dispense Refill   acetaminophen (TYLENOL) 500 MG tablet Take 1,000 mg by mouth once as needed for mild pain or moderate pain. Extra strength     amitriptyline (ELAVIL) 10 MG tablet Take 1 tablet (10 mg total) by mouth at bedtime. 90 tablet 3   amitriptyline (ELAVIL) 25 MG tablet Take 1 tablet (25 mg total) by mouth at bedtime. 90 tablet 3   amoxicillin (AMOXIL) 500 MG capsule Take 2,000 mg by mouth once as needed (dental work).     apixaban (ELIQUIS) 5 MG TABS tablet Take 1 tablet (5 mg total) by mouth  2 (two) times daily. 60 tablet 9   atenolol (TENORMIN) 50 MG tablet Take 75 mg by mouth daily.     atorvastatin (LIPITOR) 80 MG tablet Take 80 mg by mouth daily.     Biotin 5000 MCG TABS Take 5,000 mcg by mouth daily.     clotrimazole (LOTRIMIN) 1 % cream Apply 1 Application topically as needed.     Coenzyme Q10 (CO Q 10 PO) Take 200 mg by mouth daily.     cyclobenzaprine (FLEXERIL) 5 MG tablet Take 5 mg by mouth 3 (three) times daily as needed for muscle spasms.     hydrocortisone 2.5 % cream Apply 1 Application topically as needed.     Lancets (ONETOUCH DELICA PLUS LANCET33G) MISC Apply topically daily.     lisinopril (ZESTRIL) 10 MG tablet Take 10 mg by mouth daily.     metFORMIN (GLUCOPHAGE) 500 MG tablet TAKE 1 TABLET BY MOUTH TWICE A DAY WITH MEALS 60  tablet 1   Omega-3 Fatty Acids (FISH OIL PO) Take 1,400 mg by mouth daily.     omeprazole (PRILOSEC) 40 MG capsule Take 1 capsule (40 mg total) by mouth daily. 90 capsule 3   ONETOUCH ULTRA test strip USE TO CHECK BLOOD SUGAR ONCE DAILY E11.69     Propylene Glycol 0.6 % SOLN Place 2 drops into both eyes 4 (four) times daily.     senna-docusate (SENOKOT-S) 8.6-50 MG tablet Take 1 tablet by mouth as needed for mild constipation.     No current facility-administered medications on file prior to visit.    Allergies:   Allergies  Allergen Reactions   Amlodipine     Blurred vision    Compazine [Prochlorperazine Edisylate]     Stroke symptoms,   Gabapentin     Blurred vision and nystagmus   Lyrica [Pregabalin]     Blurred Vision    Prochlorperazine Other (See Comments)    Could not tolerate   Sertraline Other (See Comments)    Visual disturbances.    Chlorhexidine Rash    Blisters and redness to chest      Physical Exam  Vitals:   02/18/23 1103  BP: 120/72  Pulse: 70  Weight: 170 lb (77.1 kg)  Height: 5\' 4"  (1.626 m)    Body mass index is 29.18 kg/m. No results found.  General: well developed, well nourished,  pleasant middle-age Caucasian female, seated, in no evident distress Head: head normocephalic and atraumatic.   Neck: supple with no carotid or supraclavicular bruits Cardiovascular: regular rate and rhythm, no murmurs Musculoskeletal: Current use of L walking boot and left wrist brace Skin:  no rash/petichiae Vascular:  Normal pulses all extremities   Neurologic Exam Mental Status: Awake and fully alert.  Fluent speech and language.  Oriented to place and time. Recent  and remote memory intact. Attention span, concentration and fund of knowledge appropriate during visit. Mood and affect appropriate.  Cranial Nerves: Pupils equal, briskly reactive to light. Extraocular movements full without nystagmus. Visual fields full to confrontation. Hearing intact. Facial sensation intact. Face, tongue, palate moves normally and symmetrically.  Motor: Normal bulk and tone. Normal strength in all tested extremity muscles. Sensory: intact to touch, pinprick, position and vibratory sensation.  Coordination: Rapid alternating movements normal in all extremities. Finger-to-nose and heel-to-shin performed accurately bilaterally. Gait and Station: Arises from chair without difficulty. Stance is normal. Gait demonstrates normal stride length and balance without use of assistive device Reflexes: 1+ and symmetric. Toes downgoing.          ASSESSMENT: Jordan Martinez is a 72 y.o. 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 Loop recorder placed.  Vascular risk factors include history of endometrial cancer with completion of chemo 2019, HTN, HLD, and DM.  Evaluated by Dr. Pearlean Brownie 12/18/2020 for 34-month onset of intermittent right facial numbness and tingling of unknown etiology. 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  Evidence of atrial fibrillation on ILR in 03/2022 and since placed on Eliquis     PLAN:  Recurrent left MCA strokes Continue Eliquis 5 mg BID and atorvastatin 80 mg daily for secondary stroke prevention Loop recorder placed 07/2019 -evidence of A-fib 03/2022.  Continue Eliquis and routine follow-up with cardiology Continue close PCP follow-up for aggressive stroke risk  factor management including BP goal<130/90, HLD with LDL goal<70 and DM with A1c.<7   Face/neck paresthesias:  Unknown etiology present since 10/2020 Continue amitriptyline 35 mg nightly No recent issues Work up unremarkable  Chronic migraines post chemo:  Currently well controlled  Continue amitriptyline 35 mg nightly - refill provided     Follow up in 6 months or call earlier if needed   CC:  Nadara Eaton, MD    I spent 35 minutes of face-to-face and non-face-to-face time with patient.  This included previsit chart review, lab review, study review, order entry, electronic health record documentation, patient education and discussion regarding above diagnoses and treatment plan and answered all the questions to patient's satisfaction  Ihor Austin, [facility]  [facility] [address] [place], [place] [address]  Phone [phone] Fax [phone] Note: This document was prepared with digital dictation and possible smart phrase technology. Any transcriptional errors that result from this process are unintentional.

## 2023-02-18 NOTE — Progress Notes (Signed)
Carelink Summary Report / Loop Recorder 

## 2023-02-20 DIAGNOSIS — S63692A Other sprain of right middle finger, initial encounter: Secondary | ICD-10-CM | POA: Insufficient documentation

## 2023-03-07 LAB — CUP PACEART REMOTE DEVICE CHECK
Date Time Interrogation Session: 20240829230718
Implantable Pulse Generator Implant Date: 20210113

## 2023-03-09 ENCOUNTER — Ambulatory Visit (INDEPENDENT_AMBULATORY_CARE_PROVIDER_SITE_OTHER): Payer: Medicare Other

## 2023-03-09 DIAGNOSIS — I63412 Cerebral infarction due to embolism of left middle cerebral artery: Secondary | ICD-10-CM | POA: Diagnosis not present

## 2023-03-22 NOTE — Progress Notes (Signed)
Carelink Summary Report / Loop Recorder 

## 2023-04-07 LAB — CUP PACEART REMOTE DEVICE CHECK
Date Time Interrogation Session: 20241001230705
Implantable Pulse Generator Implant Date: 20210113

## 2023-04-12 ENCOUNTER — Ambulatory Visit: Payer: Medicare Other

## 2023-04-12 DIAGNOSIS — I63412 Cerebral infarction due to embolism of left middle cerebral artery: Secondary | ICD-10-CM

## 2023-04-16 ENCOUNTER — Inpatient Hospital Stay: Payer: Medicare Other | Attending: Gynecologic Oncology | Admitting: Gynecologic Oncology

## 2023-04-16 ENCOUNTER — Encounter: Payer: Self-pay | Admitting: Gynecologic Oncology

## 2023-04-16 VITALS — BP 141/79 | HR 79 | Temp 98.4°F | Resp 16 | Wt 174.4 lb

## 2023-04-16 DIAGNOSIS — Z90710 Acquired absence of both cervix and uterus: Secondary | ICD-10-CM | POA: Diagnosis not present

## 2023-04-16 DIAGNOSIS — Z90722 Acquired absence of ovaries, bilateral: Secondary | ICD-10-CM | POA: Diagnosis not present

## 2023-04-16 DIAGNOSIS — Z8542 Personal history of malignant neoplasm of other parts of uterus: Secondary | ICD-10-CM | POA: Insufficient documentation

## 2023-04-16 DIAGNOSIS — Z9221 Personal history of antineoplastic chemotherapy: Secondary | ICD-10-CM | POA: Insufficient documentation

## 2023-04-16 DIAGNOSIS — Z1509 Genetic susceptibility to other malignant neoplasm: Secondary | ICD-10-CM | POA: Insufficient documentation

## 2023-04-16 DIAGNOSIS — C541 Malignant neoplasm of endometrium: Secondary | ICD-10-CM

## 2023-04-16 NOTE — Progress Notes (Signed)
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 Endometrium, biopsy - HIGH GRADE CARCINOMA, SEE COMMENT. 2. Cervix, biopsy - UNREMARKABLE ENDOCERVICAL GLANDULAR AND SQUAMOUS MUCOSA. - MULTIPLE DETACHED FRAGMENTS OF HIGH GRADE CARCINOMA, MORPHOLOGICALLY SIMILAR TO SPECIMEN 1. Microscopic Comment 1. 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   04/21/2018 Imaging   US pelvis Abnormally thickened endometrium measuring up to 16 mm. In the setting of post-menopausal bleeding, endometrial sampling is indicated to exclude carcinoma. If results are benign, sonohysterogram should be considered for focal lesion work-up.    04/27/2018 Imaging   Ct abdomen and pelvis 1. Thickening of the endometrium identified compatible with endometrial carcinoma. 2. No findings to suggest metastatic adenopathy or distant metastatic disease. 3. Hiatal hernia 4.  Aortic Atherosclerosis (ICD10-I70.0).     05/02/2018 Tumor Marker   Patient's tumor was tested for the following markers: CA-125 Results of the tumor marker test revealed 65.3   05/03/2018 Pathology Results   1. Lymph node, sentinel, biopsy, right obturator - ONE OF ONE LYMPH NODES NEGATIVE FOR CARCINOMA (0/1). 2. Lymph node, sentinel, biopsy, left external iliac - FOUR OF FOUR LYMPH NODES NEGATIVE FOR CARCINOMA (0/4). 3. 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 TUMOR INVADES  LESS THAN ONE HALF OF THE MYOMETRIUM. SEE ONCOLOGY TABLE. -SEROSA: UNREMARKABLE. NO MALIGNANCY. - CERVIX: BENIGN SQUAMOUS AND ENDOCERVICAL MUCOSA. NO DYSPLASIA OR MALIGNANCY. - BILATERAL OVARIES: INCLUSION CYSTS. NO MALIGNANCY. - BILATERAL FALLOPIAN TUBES: UNREMARKABLE. NO MALIGNANCY. Microscopic Comment 3. UTERUS, CARCINOMA OR CARCINOSARCOMA Procedure: Total hysterectomy with bilateral salpingo-oophorectomy. Right obturator and left external iliac lymph node biopsies. Histologic type: Endometrioid adenocarcinoma with squamous differentiation. Histologic Grade: FIGO grade I. Myometrial invasion: Depth of invasion: 3 mm Myometrial thickness: 12 mm Uterine Serosa Involvement: Not identified. Cervical stromal involvement: Not identified. Extent of involvement of other organs: Uninvolved. Lymphovascular invasion: Not identified. Regional Lymph Nodes: Examined: 5 Sentinel 0 Non-sentinel 5 Total Lymph nodes with metastasis: 0 Isolated tumor cells (< 0.2 mm): 0 Micrometastasis: (> 0.2 mm and < 2.0 mm): 0 Macrometastasis: (> 2.0 mm): 0 Extracapsular extension: N/A. MMR / MSI testing: Will be ordered. Pathologic Stage Classification (pTNM, AJCC 8th edition): pT1a, pN0 FIGO Stage: IA Representative Tumor Block: 3D-G Comment: Pancytokeratin was performed on the lymph nodes and is negative   05/03/2018 Surgery   Operation: Robotic-assisted laparoscopic total hysterectomy with bilateral salpingoophorectomy, SLN biopsy    Surgeon: Quinn Axe    Operative Findings:  : 6cm normal appearing uterus. Normal tubes and ovaries. Normal omentum and diaphragm. No suspicious nodes.      05/03/2018 Pathology Results   PERITONEAL WASHING(SPECIMEN 1 OF 1 COLLECTED 05/03/18): MALIGNANT CELLS CONSISTENT WITH METASTATIC ADENOCARCINOMA.   05/18/2018 Tumor Marker   Patient's tumor was tested for the following markers: CA-125 Results of the tumor marker test revealed 77.5   05/27/2018  Genetic Testing   MSH6 c.2731C>T pathogenic variant and GATA2 c.1232C>T VUS identified on the multicancer panel.  The Multi-Gene Panel offered by [facility] includes sequencing and/or deletion duplication testing of the following 85 genes: AIP,  ALK, APC, ATM, AXIN2,BAP1,  BARD1, BLM, BMPR1A, BRCA1, BRCA2, BRIP1, CASR, CDC73, CDH1, CDK4, CDKN1B, CDKN1C, CDKN2A (p14ARF), CDKN2A (p16INK4a), CEBPA, CHEK2, CTNNA1, DICER1, DIS3L2, EGFR (c.2369C>T, p.Thr790Met variant only), EPCAM (Deletion/duplication testing only), FH, FLCN, GATA2, GPC3, GREM1 (Promoter region deletion/duplication testing only), HOXB13 (c.251G>A, p.Gly84Glu), HRAS, KIT, MAX, MEN1, MET, MITF (c.952G>A, p.Glu318Lys variant only), MLH1, MSH2, MSH3, MSH6, MUTYH, NBN, NF1, NF2, NTHL1, PALB2, PDGFRA, PHOX2B, PMS2, POLD1, POLE, POT1, PRKAR1A, PTCH1, PTEN, RAD50, RAD51C, RAD51D, RB1, RECQL4, RET, RNF43, RUNX1, SDHAF2, SDHA (sequence changes only), SDHB, SDHC, SDHD, SMAD4, SMARCA4, SMARCB1, SMARCE1, STK11, SUFU, TERC, TERT, TMEM127, TP53, TSC1, TSC2, VHL, WRN and WT1.  The report date is 05/27/2018.   06/09/2018 Cancer Staging   Staging form: Corpus Uteri - Carcinoma and Carcinosarcoma, AJCC 8th Edition - Pathologic: Stage I (pT1, pN0, cM0) - Signed by Artis Delay, MD on 06/09/2018   06/15/2018 Procedure   Placement of single lumen port a cath via right internal jugular vein. The catheter tip lies at the cavo-atrial junction. A power injectable port a cath was placed and is ready for immediate use.     06/20/2018 - 10/26/2018 Chemotherapy   The patient had carboplatin and Taxol x 2 cycles, Cycle 3 Taxol at 50%, then cycle 4-6 without Taxol, carboplatin only   06/25/2018 - 06/27/2018 Hospital Admission   She was admitted to the hospital with deydration   11/23/2018 Imaging   1. 8 mm hyperattenuating nodule along the midline vaginal cuff. Coronal imaging suggests that the hyperattenuation is related to calcification, but enhancement in this nodule  cannot be excluded. Patient is status post hysterectomy in the interval since prior CT. While this finding may be related to prior surgery, close follow-up recommended. 2. Otherwise no evidence for metastatic disease in the abdomen or pelvis. 3. Large hiatal hernia with more than 50% of the stomach herniated into the lower chest, stable. 4.  Aortic Atherosclerois (ICD10-170.0)   12/01/2018 Procedure   Successful removal of implanted Port-A-Cath    She noted 3 weeks of postmenopausal bleeding.  On 03/30/2018 she reports this was quite heavy with cramps.  [place] new to [place] living in a retirement community [name] and she was unable to find a provider in a timely manner.  She was referred by the retirement center to an urgent care center in [facility] who then triaged her to see Dr. Erin Fulling.  Given the patient's symptoms an endometrial biopsy was performed along with a pelvic ultrasound.  Documented exam by Dr. Erin Fulling states that there was a lesion at 6:00 on the cervix which was biopsied.  In addition to the endometrial biopsy being performed.  The endometrial biopsy revealed high-grade carcinoma with a comment stating the majority appears to be FIGO grade 3 endometrioid with scattered foci suggestive of serous phenotype.  The cervix revealed unremarkable endocervical glandular and squamous mucosa there were detached fragments of high-grade carcinoma similar to the endometrial biopsy tissue.  Ultrasound revealed a uterus 6.6 x 3 x 3.7 cm with no fibroids endometrium 1.6 cm right ovary normal left ovary not visualized.   The patient is retired Orthoptist.  She states her last Pap smear was in March 2019 in [place].  She states she got a phone call from that office that her Pap smear was normal.   A CT scan was performed preop and noted below.  In summary no evidence of extrauterine disease.   The patient underwent surgical staging with a robotic assisted total  hysterectomy BSO sentinel lymph node biopsy on May 03, 2018.  Due to her symptoms of abdominal bloating, elevated Ca1 25, and her daughter's history of Lynch syndrome her tinea washings were obtained.  Intraoperative findings were otherwise unremarkable and she had no gross extrauterine disease within normal omentum diaphragms and peritoneal surfaces, 6 cm normal-appearing uterus, normal tubes and ovaries, no suspicious nodes.  The pathology revealed positive malignant cells consistent with metastatic adenocarcinoma in the peritoneal washings.  The histopathology report revealed a FIGO grade 1 endometrioid endometrial adenocarcinoma measuring 2.2 cm and invading less than one half of the myometrium (3 of 12 mm), with no LVSI, and 5- sentinel lymph nodes, with normal cervix and adnexa.  Given the finding of positive washings, the fallopian tubes and ovaries were subsequently submitted for complete serial sectioning and no occult ovarian or fallopian tube carcinoma was identified.  MSI testing showed high instability, and mismatch repair protein IHC was abnormal with loss of nuclear expression of MSH 6.   She was subsequently seen by the genetics counselors on 05/18/2018 who drew blood for genetic screening including Lynch syndrome which was positive (mutation in MSH6).   G iven the presence of positive washings and the preoperative biopsy of high grade cancer, she was felt to represent high risk for recurrence, and therefore adjuvant chemotherapy with carboplatin and paclitaxel was recommended. She received chemotherapy between June 20, 2018 and October 26, 2018.  Chemotherapy was somewhat poorly tolerated by the patient who had severe bone pain, and issues with diabetes and dehydration.  She received 2 cycles of carboplatin with paclitaxel, a third cycle with Taxol at 50%, and then cycle 4-6 without Taxol carboplatin as a single agent.   Post treatment imaging on Nov 23, 2018 revealed an 8 mm  hyperattenuating nodule along the midline vaginal cuff this is well-circumscribed.  It appeared related to calcification.  Otherwise no evidence of metastatic disease in the abdomen and pelvis.   A follow-up PET/CT was ordered and performed on 02/23/2021 reassess the area at the vaginal cuff that had previously shown some nodularity and avidity.  On this follow-up PET scan there was no metabolic evidence of recurrent metastatic disease.  There is a stable benign postsurgical change at the hysterectomy margin that had calcified.  No other new disease was visualized.  The PET does show evidence of coronary artery disease and atherosclerosis.   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 She was started on ASA 81 mg. She had a CVA vs TIA in 07/2021.  Interval History: Patient reports doing well.  Had COVID last month for the first time.  Got her COVID-vaccine last week and had a robust immune response.  She denies any vaginal bleeding or discharge.  Denies any pelvic or abdominal pain.  Continues to have some joint pain.  Reports normal bladder and bowel function.  Past Medical/Surgical History: Past Medical History:  Diagnosis Date   Allergy    Anxiety    on meds   Arthritis    on PRN meds   Cataract    bilateral cat. ext. with lens implant- in [place]   Diabetes mellitus without complication (HCC)    diet controlled. States always has borderline HgbA1C   Endometrial cancer (HCC)    endometrial CA-2019   Family history of colon cancer    daughter 89 colon CA   Family history of genetic mutation for hereditary nonpolyposis colorectal cancer (HNPCC)  GERD (gastroesophageal reflux disease)    on meds   High cholesterol    on meds   History of kidney stones    years    Hypertension    on meds   Neuromuscular disorder (HCC)    Neuropathy    Sensation of pressure in bladder area    Stroke (HCC) 07/17/2019   07/2021 (started on Eliquis  in 03/2022)   TIA (transient ischemic attack) 07/2021   Uterine cramping     Past Surgical History:  Procedure Laterality Date   burnt the nerves  Right 04/29/2018   in right knee    burnt the nerves  Left 04/22/2018   left knee nerve    COLONOSCOPY  12/2018   [place]- 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   cystoscopy kidney stone   1985   HYSTERECTOMY ABDOMINAL WITH SALPINGO-OOPHORECTOMY  2019   IR CT HEAD LTD  07/18/2019   IR IMAGING GUIDED PORT INSERTION  06/15/2018   IR PERCUTANEOUS ART THROMBECTOMY/INFUSION INTRACRANIAL INC DIAG ANGIO  07/18/2019   IR REMOVAL TUN ACCESS W/ PORT W/O FL MOD SED  12/01/2018   LOOP RECORDER INSERTION N/A 07/19/2019   Procedure: LOOP RECORDER INSERTION;  Surgeon: Regan Lemming, MD;  Location: [facility];  Service: Cardiovascular;  Laterality: N/A;   POLYPECTOMY     RADIOLOGY WITH ANESTHESIA N/A 07/17/2019   Procedure: IR WITH ANESTHESIA;  Surgeon: Julieanne Cotton, MD;  Location: [facility] OR;  Service: Radiology;  Laterality: N/A;   REPLACEMENT TOTAL KNEE BILATERAL Bilateral 2018   Right x 2. 2019 undergoing intermittent injection "RFA" procedures with Dr. Petra Kuba [facility]   REVISION TOTAL KNEE ARTHROPLASTY Right 03/2017   ROBOTIC ASSISTED TOTAL HYSTERECTOMY WITH BILATERAL SALPINGO OOPHERECTOMY N/A 05/03/2018   Procedure: XI ROBOTIC ASSISTED TOTAL HYSTERECTOMY WITH BILATERAL SALPINGO OOPHORECTOMY;  Surgeon: Adolphus Birchwood, MD;  Location: [facility] OR[facility];  Service: Gynecology;  Laterality: N/A;   SENTINEL NODE BIOPSY N/A 05/03/2018   Procedure: SENTINEL NODE BIOPSY;  Surgeon: Adolphus Birchwood, MD;  Location: [facility];  Service: Gynecology;  Laterality: N/A;   TONSILLECTOMY AND ADENOIDECTOMY     TUBAL LIGATION      Family History  Adopted: Yes  Problem Relation Age of Onset   Colon cancer Daughter 87       Lynch syndrome - MSH6 +   Colon polyps Daughter 54   Healthy Son    Esophageal cancer Neg Hx    Rectal cancer Neg Hx    Stomach cancer  Neg Hx     Social History   Socioeconomic History   Marital status: Divorced    Spouse name: Not on file   Number of children: 2   Years of education: Not on file   Highest education level: Not on file  Occupational History   Occupation: retired Engineer, civil (consulting)  Tobacco Use   Smoking status: Every Day    Current packs/day: 0.25    Average packs/day: 0.3 packs/day for 40.0 years (10.0 ttl pk-yrs)    Types: Cigarettes   Smokeless tobacco: Never   Tobacco comments:    4-5 loose cigarettes per day , over 20 year hx of smoking   Vaping Use   Vaping status: Never Used  Substance and Sexual Activity   Alcohol use: Yes    Alcohol/week: 14.0 standard drinks of alcohol    Types: 14 Glasses of wine per week    Comment: about 2 glasses wine daily.    Drug use: Never   Sexual activity:  Not Currently    Birth control/protection: None  Other Topics Concern   Not on file  Social History Narrative   Lives alone   Social Determinants of Health   Financial Resource Strain: Low Risk  (01/19/2022)   [facility] visits prior to 09/05/2022.   Overall Financial Resource Strain (CARDIA)    Difficulty of Paying Living Expenses: Not hard at all  Food Insecurity: No Food Insecurity (01/19/2022)   [facility] visits prior to 09/05/2022[facility] visits prior to 09/05/2022., [facility]   Hunger Vital Sign    Worried About Running Out of Food in the Last Year: Never true    Ran Out of Food in the Last Year: Never true  Transportation Needs: No Transportation Needs (01/19/2022)   Received from [facility] - Transportation  Physical Activity: Insufficiently Active (01/19/2022)   [facility] prior to 09/05/2022[facility] visits prior to 09/05/2022.   Exercise Vital Sign    Days of Exercise per Week: 3 days    Minutes of Exercise per Session: 40 min  Stress: No Stress Concern Present (01/19/2022)   [facility] prior to 09/05/2022., [facility] visits prior to 09/05/2022.   [employer] of Occupational Health - Occupational Stress Questionnaire    Feeling of Stress : Only a little  Social Connections: Moderately Isolated (01/19/2022)   [facility] visits prior to 09/05/2022., [facility] visits prior to 09/05/2022.   Social Connection and Isolation Panel [NHANES]    Frequency of Communication with Friends and Family: More than three times a week    Frequency of Social Gatherings with Friends and Family: Three times a week    Attends Religious Services: More than 4 times per year    Active Member of Clubs or Organizations: No    Attends Banker Meetings: Never    Marital Status: Divorced    Current Medications:  Current Outpatient Medications:    acetaminophen (TYLENOL) 500 MG tablet, Take 1,000 mg by mouth once as needed for mild pain or moderate pain. Extra strength, Disp: , Rfl:    amitriptyline (ELAVIL) 10 MG tablet, Take 1 tablet (10 mg total) by mouth at bedtime., Disp: 90 tablet, Rfl: 3   amitriptyline (ELAVIL) 25 MG tablet, Take 1 tablet (25 mg total) by mouth at bedtime., Disp: 90 tablet, Rfl: 3   amoxicillin (AMOXIL) 500 MG capsule, Take 2,000 mg by mouth once as needed (dental work)., Disp: , Rfl:    apixaban (ELIQUIS) 5 MG TABS tablet, Take 1 tablet (5 mg total) by mouth 2 (two) times daily., Disp: 60 tablet, Rfl: 9   atenolol (TENORMIN) 50 MG tablet, Take 75 mg by mouth daily., Disp: , Rfl:    atorvastatin (LIPITOR) 80 MG tablet, Take 80 mg by mouth daily., Disp: , Rfl:    Biotin 5000 MCG TABS,  Take 5,000 mcg by mouth daily., Disp: , Rfl:    clotrimazole (LOTRIMIN) 1 % cream, Apply 1 Application topically as needed., Disp: , Rfl:    Coenzyme Q10 (CO Q 10 PO), Take 200 mg by mouth daily., Disp: ,  Rfl:    cyclobenzaprine (FLEXERIL) 5 MG tablet, Take 5 mg by mouth 3 (three) times daily as needed for muscle spasms., Disp: , Rfl:    hydrocortisone 2.5 % cream, Apply 1 Application topically as needed., Disp: , Rfl:    Lancets (ONETOUCH DELICA PLUS LANCET33G) MISC, Apply topically daily., Disp: , Rfl:    lisinopril (ZESTRIL) 10 MG tablet, Take 10 mg by mouth daily., Disp: , Rfl:    metFORMIN (GLUCOPHAGE) 500 MG tablet, TAKE 1 TABLET BY MOUTH TWICE A DAY WITH MEALS, Disp: 60 tablet, Rfl: 1   Omega-3 Fatty Acids (FISH OIL PO), Take 1,400 mg by mouth daily., Disp: , Rfl:    omeprazole (PRILOSEC) 40 MG capsule, Take 1 capsule (40 mg total) by mouth daily., Disp: 90 capsule, Rfl: 3   ONETOUCH ULTRA test strip, USE TO CHECK BLOOD SUGAR ONCE DAILY E11.69, Disp: , Rfl:    Propylene Glycol 0.6 % SOLN, Place 2 drops into both eyes 4 (four) times daily., Disp: , Rfl:    senna-docusate (SENOKOT-S) 8.6-50 MG tablet, Take 1 tablet by mouth as needed for mild constipation., Disp: , Rfl:   Review of Systems: Denies appetite changes, fevers, chills, fatigue, unexplained weight changes. Denies hearing loss, neck lumps or masses, mouth sores, ringing in ears or voice changes. Denies cough or wheezing.  Denies shortness of breath. Denies chest pain or palpitations. Denies leg swelling. Denies abdominal distention, pain, blood in stools, constipation, diarrhea, nausea, vomiting, or early satiety. Denies pain with intercourse, dysuria, frequency, hematuria or incontinence. Denies hot flashes, pelvic pain, vaginal bleeding or vaginal discharge.   Denies joint pain, back pain or muscle pain/cramps. Denies itching, rash, or wounds. Denies dizziness, headaches, numbness or seizures. Denies swollen lymph nodes or  glands, denies easy bruising or bleeding. Denies anxiety, depression, confusion, or decreased concentration.  Physical Exam: BP (!) 141/79 (BP Location: Left Arm, Patient Position: Sitting)   Pulse 79   Temp 98.4 F (36.9 C) (Oral)   Resp 16   Wt 174 lb 6.4 oz (79.1 kg)   SpO2 99%   BMI 29.94 kg/m  General: Alert, oriented, no acute distress. HEENT: Normocephalic, atraumatic, sclera anicteric. Chest: Clear to auscultation bilaterally.  Port site clean. Cardiovascular: Regular rate and rhythm, no murmurs. Abdomen: soft, nontender.  Normoactive bowel sounds.  No masses or hepatosplenomegaly appreciated.  Well-healed incisions. Extremities: Grossly normal range of motion.  Warm, well perfused.  No edema bilaterally. Skin: No rashes or lesions noted. Lymphatics: No cervical, supraclavicular, or inguinal adenopathy. GU: Normal appearing external genitalia without erythema, excoriation, or lesions.  Speculum exam reveals mildly atrophic vaginal mucosa, no lesions seen.  Cuff intact.  Bimanual exam reveals no nodularity or masses.  Rectovaginal exam confirms findings.  Laboratory & Radiologic Studies: None new  Assessment & Plan: Jordan Martinez is a 72 y.o. woman with a history of Stage IA grade 3 endometrial cancer with + washings who presents for surveillance visit. MSI high, MMR abnormal (loss of MSH 6). 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  S/p 6 cycles platinum and taxane chemotherapy completed April 22nd, 2020.  Complete clinical response, no evidence of disease on exam.  Post-treatment imaging revealed nodule at vaginal cuff (8mm). No concerning findings on examination and stable/benign appearing on follow-up imaging.  Last imaging was in 02/2021 (PET) and negative for evidence of metastatic disease.   Patient is doing well and is NED on exam today.   We reviewed signs and symptoms that would be concerning for recurrence.  We will continue with surveillance visits every 6 months  until April 2025.  I stressed the importance of calling to see me sooner if she develops any new and concerning symptoms.  20 minutes of total time was spent for this patient encounter, including preparation, face-to-face counseling with the patient and coordination of care, and documentation of the encounter.  Eugene Garnet, MD  Division of Gynecologic Oncology  Department of Obstetrics and Gynecology  [facility]

## 2023-04-16 NOTE — Patient Instructions (Signed)
It was good to see you today.  I do not see or feel any evidence of cancer recurrence on your exam.  I will see you for follow-up in 6 months.  As always, if you develop any new and concerning symptoms before your next visit, please call to see me sooner.   

## 2023-04-29 NOTE — Progress Notes (Signed)
Carelink Summary Report / Loop Recorder 

## 2023-04-30 ENCOUNTER — Encounter: Payer: Self-pay | Admitting: Cardiology

## 2023-05-17 ENCOUNTER — Ambulatory Visit (INDEPENDENT_AMBULATORY_CARE_PROVIDER_SITE_OTHER): Payer: Medicare Other

## 2023-05-17 DIAGNOSIS — I63412 Cerebral infarction due to embolism of left middle cerebral artery: Secondary | ICD-10-CM | POA: Diagnosis not present

## 2023-05-21 LAB — CUP PACEART REMOTE DEVICE CHECK
Date Time Interrogation Session: 20241103230232
Implantable Pulse Generator Implant Date: 20210113

## 2023-06-11 ENCOUNTER — Ambulatory Visit (INDEPENDENT_AMBULATORY_CARE_PROVIDER_SITE_OTHER): Payer: Medicare Other

## 2023-06-11 DIAGNOSIS — I63412 Cerebral infarction due to embolism of left middle cerebral artery: Secondary | ICD-10-CM

## 2023-06-14 LAB — CUP PACEART REMOTE DEVICE CHECK
Date Time Interrogation Session: 20241206230210
Implantable Pulse Generator Implant Date: 20210113

## 2023-06-14 NOTE — Progress Notes (Signed)
Carelink Summary Report / Loop Recorder 

## 2023-07-10 ENCOUNTER — Other Ambulatory Visit: Payer: Self-pay | Admitting: Cardiology

## 2023-07-12 NOTE — Telephone Encounter (Signed)
 Prescription refill request for Eliquis received. Indication:afib Last office visit:3/24 Scr:0.76  7/24 Age: 73 Weight:79.1  kg  Prescription refilled

## 2023-07-16 ENCOUNTER — Ambulatory Visit: Payer: Medicare Other

## 2023-07-16 DIAGNOSIS — I63412 Cerebral infarction due to embolism of left middle cerebral artery: Secondary | ICD-10-CM | POA: Diagnosis not present

## 2023-07-16 LAB — CUP PACEART REMOTE DEVICE CHECK
Date Time Interrogation Session: 20250109230153
Implantable Pulse Generator Implant Date: 20210113

## 2023-07-16 LAB — LAB REPORT - SCANNED
A1c: 7.8
EGFR: 95

## 2023-07-29 DIAGNOSIS — Z9221 Personal history of antineoplastic chemotherapy: Secondary | ICD-10-CM | POA: Insufficient documentation

## 2023-08-19 ENCOUNTER — Ambulatory Visit: Payer: Medicare Other | Admitting: Gynecologic Oncology

## 2023-08-20 ENCOUNTER — Ambulatory Visit (INDEPENDENT_AMBULATORY_CARE_PROVIDER_SITE_OTHER): Payer: Medicare Other

## 2023-08-20 DIAGNOSIS — I63412 Cerebral infarction due to embolism of left middle cerebral artery: Secondary | ICD-10-CM | POA: Diagnosis not present

## 2023-08-20 NOTE — Progress Notes (Signed)
Carelink Summary Report / Loop Recorder

## 2023-08-21 LAB — CUP PACEART REMOTE DEVICE CHECK
Date Time Interrogation Session: 20250213230028
Implantable Pulse Generator Implant Date: 20210113

## 2023-08-31 ENCOUNTER — Encounter: Payer: Self-pay | Admitting: Adult Health

## 2023-08-31 ENCOUNTER — Ambulatory Visit (INDEPENDENT_AMBULATORY_CARE_PROVIDER_SITE_OTHER): Payer: Medicare Other | Admitting: Adult Health

## 2023-08-31 VITALS — BP 141/76 | HR 76 | Ht 64.0 in | Wt 169.6 lb

## 2023-08-31 DIAGNOSIS — I6523 Occlusion and stenosis of bilateral carotid arteries: Secondary | ICD-10-CM

## 2023-08-31 DIAGNOSIS — G43709 Chronic migraine without aura, not intractable, without status migrainosus: Secondary | ICD-10-CM | POA: Diagnosis not present

## 2023-08-31 DIAGNOSIS — R202 Paresthesia of skin: Secondary | ICD-10-CM

## 2023-08-31 DIAGNOSIS — I639 Cerebral infarction, unspecified: Secondary | ICD-10-CM | POA: Diagnosis not present

## 2023-08-31 MED ORDER — AMITRIPTYLINE HCL 25 MG PO TABS: 25.0000 mg | ORAL_TABLET | Freq: Every day | ORAL | 3 refills | Status: AC

## 2023-08-31 MED ORDER — AMITRIPTYLINE HCL 10 MG PO TABS: 10.0000 mg | ORAL_TABLET | Freq: Every day | ORAL | 3 refills | Status: AC

## 2023-08-31 MED ORDER — ROSUVASTATIN CALCIUM 40 MG PO TABS: 40.0000 mg | ORAL_TABLET | Freq: Every day | ORAL | 11 refills | Status: DC

## 2023-08-31 NOTE — Patient Instructions (Addendum)
 Your Plan:  Continue amitriptyline at current dosage  Please call with any worsening migraines  Continue Eliquis for secondary stroke prevention measures Recommend stopping atorvastatin and starting rosuvastatin 40mg  daily for better control of cholesterol. Repeat labs in July, you can send these results over to me so this can be further reviewed.   Continue routine follow-up with your PCP and cardiology for stroke risk factor management  You will be called to schedule a carotid ultrasound for monitor carotid stenosis  Would highly recommend complete tobacco cessation - if assistance is needed, please follow up with your PCP      Follow-up in 1 year or call earlier if needed     Thank you for coming to see [place] at [facility]. I hope we have been able to provide you high quality care today.  You may receive a patient satisfaction survey over the next few weeks. We would appreciate your feedback and comments so that we may continue to improve ourselves and the health of our patients.

## 2023-08-31 NOTE — Progress Notes (Signed)
 [facility] [address] [place]. [place] [address] [phone]       STROKE FOLLOW UP NOTE  Ms. Karinne Schmader Date of Birth:  11/26/1950 Medical Record Number:  [id number]   Reason for Referral: Stroke follow-up    CHIEF COMPLAINT:  Chief Complaint  Patient presents with   Follow-up    Pt in room 3. Alone. Here for stroke/ migraine follow up. Pt reports doing well, still take Eliquis atorvastatin. Pt reports migraines are well, averages maybe 1 per month.       HPI:  Update 08/31/2023 JM: Patient returns for 73-month follow-up.  Stable without new or reoccurring stroke/TIA symptoms.  Migraine headaches remain well-controlled, has about 1 migraine per month which is typically mild, will resolve after Tylenol Extra Strength and a couple coffee.  Continues on amitriptyline 35mg  nightly without side effects.  Reports compliance on Eliquis and atorvastatin.  Lab work by PCP last month showed A1c 7.8 and LDL 90, metformin increased and was started on niacin, plans on repeat A1c this week and repeat lipid panel in July.  Routinely follows with PCP and cardiology.  Blood pressure today slightly elevated but believes due to increased stress with appointment, monitors at home and typically 100-110/60s.  Admits to continued tobacco use, about 2 to 4 cigarettes in the evening time which helps her relax, she continues to struggle with the death of her son back in 12/12/2022 from an drug overdose laced with fentanyl. She does have a close friend who lives at [place] and a daughter who she is close with.  No further questions or concerns at this time.    History provided for reference purposes only Update 02/18/2023 JM: Patient returns for follow-up visit.  Stable from stroke standpoint without new stroke/TIA symptoms. Reports resolution of prior speech difficulties. Compliant on Eliquis and atorvastatin.  Routinely follows with PCP and cardiology for stroke risk factor management.   Remains on amitriptyline for facial paresthesias and migraine headaches. No recent headache occurrence. Being followed by ortho for bilateral hip issues receiving hip injections under xray, reports benefit.   Update 05/20/2022 JM: Patient returns for 37-month stroke follow-up.  Overall stable without new stroke/TIA symptoms.  Reports residual occasional word finding difficulty with some improvement noted since prior visit.  Remains on amitriptyline 10mg  evening and 25mg  nightly for facial paresthesias and occasional migraine headaches, denies any recent issues with facial paresthesias or migraine headaches.  Does report recent mild generalized headache but resolved after short duration without intervention.  She questions combining amitriptyline dosages to just bedtime alone.  Loop recorder showed evidence of A-fib on 9/10, Eliquis 5 mg BID initiated for CHA2DS2-VASc score of 5.  Plavix discontinued.  Closely followed by cardiology.  She has remained on Eliquis as well as atorvastatin without side effects.  Blood pressure well controlled.  Did have a fall 10/20 which resulted in fracture of her left foot and sprain of her left wrist, she is being closely followed by orthopedics  Update 11/13/2021 JM: Patient returns for 75-month follow-up after prior visit with Dr. Pearlean Brownie for hospital follow-up.  She is accompanied by her friend, Ed.  She has been doing well since that time without any new stroke/TIA symptoms.  She has made great recovery since prior visit with improvement of speech and only occasional difficulty seeing correct words and occasional stuttering with increased anxiety.  She continues to have some difficulty with numbers and letters but this is also greatly improved.  She completed 8 weeks  of SLP.  She routinely does sudoku and word search.  She does note continued anxiety but this has been gradually improving.  She plans on further discussing with PCP at follow-up visit next month.  She also  mentions eyestrain since her stroke but denies any visual loss or double vision.  She was scheduled to see her ophthalmologist for yearly follow-up but needed to reschedule and unable to be seen until September.  Compliant on Plavix and atorvastatin, denies side effects.  Blood pressure today 130/70.  She remains on amitriptyline 10mg  at dinner and 25mg  nightly - neck numbness/paresthesias stable and occasional migraine headaches but overall well controlled.  No further concerns at this time.  Update 08/11/2021 Dr. Pearlean Brownie Patient is seen today for follow-up upon her request accompanied by her friend.  She was admitted to [facility] on 07/29/2021 following 4 days of vomiting and diarrhea and dehydration.  She developed sudden onset of speech difficulties the and evaluation showed multiple small left frontoparietal as well as left occipital embolic infarcts.  CT angiogram showed 50% proximal left ICA stenosis however carotid ultrasound only showed 1-39% bilateral stenosis.  Echocardiogram showed normal ejection fraction without cardiac source of embolism.  Telemetry monitoring did not show any significant atrial fibrillation.  LDL cholesterol was marginally elevated at 81 mg percent.  Patient had been on aspirin and Plavix was added for 3 weeks.  She states she is doing well.  She is getting outpatient speech therapy presently.  She is still having some intermittent speech and word finding difficulties as well as some transient confusion and at times was noted trouble with writing checks she struggles with letters in spelling.  At times she has trouble cutting food holding a fork and knife.  She is concerned given trouble tying her shoelaces or feeling the gas tank.  She admits to having significant anxiety related to the problem she is having after the stroke.  She is on Lipitor 40 mg daily and tolerating it well but does have some muscle aches and pains.  She has never taken co-Q10.  She denies any episodes  of numbness and paresthesias today.   Update 06/16/2021 JM:  Patient returns for follow-up unaccompanied. Right facial numbness and tingling and posterior neck pain significantly improved. Has finished physical therapy for her neck and continues exercises at home. No additional headaches. No additional transient confusion episodes - MR brain and EEG unremarkable. Denies new stroke/TIA symptoms. Remains on Atorvastatin and Aspirin without side effects and Amitriptyline 35 mg nightly for headaches and facial pain. BP today 127/81. Pt reports A1C of 6.4, unable to view in [facility], next labs in January.  Loop recorder has not shown atrial fibrillation thus far.  She plans on traveling to [place] soon to visit family (she has not been able to over the past 3 years due to medical condition).  No further concerns at this time.  Update 03/18/2021 JM: Ms. Norfolk returns for 39-month follow-up unaccompanied.  She reports continued right facial numbness/tingling (more so jaw and side of neck) and posterior neck pain but overall improving currently working with PT.  Mainly symptoms only present in the evening.  She remains on amitriptyline 25 mg nightly and has not experienced any recent headaches.  Discussed use of extra 10 mg tablet as needed for breakthrough headaches as well as worsening nerve pain as needed but forget this was discussed and has not yet trialed extra dose as she has not had any additional headaches.  She  does report approximately 2 weeks ago 2 separate episodes of transient confusion - 1 while trying to pump gas and forgot steps (lasted less than 1 min) and another when she mixed up checks to be mailed with her recycled papers (as both recycling and mailbox in the same vicinity where she resides) and ended up putting her checks in the recycling.  She shortly realized what she did and was able to recover her checks and mail them appropriately.  No other associated symptoms or neurological type symptoms.  She  did experience similar symptoms after her prior stroke but no reoccurrence since then. No additional events since that time.  Reports compliance on aspirin and atorvastatin.  Blood pressure today 124/66. No further concerns at this time.   Update 01/14/2021 JM: Ms. Dearman returns for sooner follow-up visit unaccompanied to discuss imaging and further treatment options after prior visit with Dr. Pearlean Brownie 1 month ago for intermittent right lower face and neck paresthesias that first presented around 10/2020. MR brain w/wo 12/29/2020 showed chronic left temporal and left insular ischemic infarcts but no evidence of acute findings. MR cervical 12/29/2020 degenerative changes with mild narrowing C4, C5 and C6 and severe left foraminal stenosis at C3-4 and severe bilateral foraminal stenosis C4-5.  She continues to experience symptoms without new characteristics or features or worsening.  Typically worse in the evening associated with increase neck pain and stiffness.  Denies any radiating symptoms into her arms.  She has remained on amitriptyline 25 mg nightly for migraine prophylaxis with occasional 10 mg dose as needed.  Stable from stroke standpoint without new or reoccurring stroke/TIA symptoms.  Compliant on aspirin and atorvastatin without associated side effects.  Blood pressure today 134/84.  Loop recorder negative for atrial fibrillation thus far.  Update 12/18/2020 Dr. Pearlean Brownie: Ms. Riling is a pleasant 73 year old Caucasian lady seen today for office consultation visit right face and neck numbness.  History is obtained from the patient and review of referral notes and I personally reviewed electronic medical records in Care Everywhere and pertinent imaging films in PACS.  She has past medical history of diabetes, colon  cancer, hyperlipidemia, hypertension, left MCA infarct in January 2021 due to left M2 occlusion s/p IV tPA and mechanical thrombectomy with resultant small subarachnoid hemorrhage in the left sylvian  fissure and left MCA branch infarct.  Patient has followed up for stroke follow-up in the clinic and was last seen by [facility] nurse practitioner on 10/01/2020.  Patient has a new complaint of tingling and numbness involving the outer aspect of the lower right face as well as neck.  This began about 2 months ago.  Initially this feeling would go down into the right arm as well as forearm.  This lasted only few days and since then she has been having intermittent tingling sensation on the outer aspect of the right lower face as well as the neck.  This may occur a couple of times a day.  Lasts only 1 to 2 minutes.  There are no specific triggers.  This is annoying but not very bothersome.  Occasionally she has discomfort and pain in the base of her neck.  She denies any true radicular pain or severe neck pain.  There is no history of neck injury, motor vehicle accident.  She recently saw her orthopedic physician and [place] who did x-rays of the cervical spine which showed mild disc degenerative changes.  Patient also has a history of headaches following her stroke and was prescribed  Elavil 25 mg at night which seems to help she still has headaches about couple of times a week but they are tolerable and not as bothersome.  She is currently on aspirin for stroke prevention and is having mild bruising but no major bleeding episodes.  He is tolerating Lipitor well with only occasional leg cramps.  Blood pressure is usually well controlled at home the today it is elevated in office at 159/85.  She plans to see a primary care physician later this month and have follow-up lipid profile and hemoglobin A1c checked.  She is extremely anxious that she may have cancer and worries a lot about this.  She has a daughter in [place] who is undergoing surgery this week and patient had to cancel her trip and she wants to finish her work-up prior to going there.  Update 10/01/2020 JM: Ms. Gregg returns for stroke and headache 13-month  follow-up accompanied by her friend, Napoleon Form from a stroke standpoint without new or recurring stroke/TIA symptoms Reports compliance on aspirin and atorvastatin -denies associated side effects Blood pressure today 135/83 Loop recorder has not shown atrial fibrillation thus far  Headaches have been stable only experiencing one migraine since prior visit. She took an additional amitriptyline 25 mg tablet with resolution.  She will experience very mild headaches approximately 2-3 times a week typically after dinner prior to schedule amitriptyline dosage but will quickly resolve after taking amitriptyline.  Ramon Dredge reports some difficulty with memory and anxiety She will have difficulty remembering numbers such as her address or cell phone password -this is been consistent since her stroke She feels as though her anxiety is greatly improving where she is not consistently fearful of having additional strokes and is sleeping better at night  She is being followed by pain clinic for b/l knee pain recently receiving COOLIEF therapy by Dr. Larence Penning but denies much benefit.  She has scheduled follow-up visit with him tomorrow for further discussion  No further concerns at this time  Update 04/01/2020 JM patient returns for stroke f/u. Stable from stroke standpoint since prior visit without residual deficits. Denies new stroke/TIA symptoms. Chronic headaches stable on amitriptyline 50mg  nightly but does report morning grogginess.  Remains on aspirin 81mg  daily and atorvastatin without side effects. Blood pressure today 138/78. Loop recorder has not shown atrial fibrillation thus far.  She continues to be followed by pain management for bilateral knee osteoarthritis.  Does report occasional anxiety due to medical conditions but overall stable.  No further concerns at this time.  Update 11/29/2019 JM: Ms. Woodhams is being seen for follow-up regarding left MCA stroke in 07/2018.  She has been doing well from a  stroke standpoint without new or reoccurring stroke/TIA symptoms.  Underlying chronic headaches worsened post stroke and initiated amitriptyline with eventual increase to 50mg  with great benefit. Reports mild side effects such as mild fatigue the following day and mild skin photosensitivity. She does endorse improvement of anxiety as well with use of amitriptyline. Continues on aspirin and atorvastatin for secondary stroke prevention.  Blood pressure today 118/62.  Loop recorder has not shown atrial fibrillation thus far.  No further concerns at this time.  Initial visit 08/23/2019 JM: Ms. Hirschfeld is a 73 year old female who is being seen today for hospital follow up.  She is a retired Financial planner.  Recovered well from a stroke standpoint without residual speech deficits.  Prior history of frontal headaches worsened with eyestrain and was told due to prior  chemo treatments.  Headache intensity worsened post stroke gradually returning to baseline but continues to experience frequently.  At times she also experienced cloudiness but this has been unchanged.  She will be seeing her ophthalmologist next week for 1 year follow-up.  Describes headaches as a pressure sensation in frontal region and behind eyes bilaterally associated with photophobia and phonophobia but denies nausea/vomiting.  She will occasionally experience small flicker of light in her periphery prior to headache onset and is unsure if headaches are worse when she experiences visual aura.  She has not previously been treated for headaches or been diagnosed with migraine headaches.  She also endorses increased anxiety due to fear of recurrent stroke.  She has completed 3 weeks DAPT and continues on aspirin alone without bleeding or bruising.  Continues on atorvastatin 40 mg daily without myalgias.  Blood pressure today satisfactory at 130/70.  She has not had follow-up with IR as recommended post revascularization during admission.   Loop recorder is not shown atrial fibrillation thus far.  No further concerns at this time.  Hospital admission 07/17/2019: Ms. Katianne Barre is a 73 y.o. female with history of diabetes, endometrial cancer, HNPCC, hyperlipidemia, hypertension who presented on 07/17/2019 with aphasia.  Evaluated by stroke team and  Dr. Roda Shutters with stroke work-up revealing left MCA infarct due to left M2 occlusion s/p TPA and IR with TICI 3 revascularization of distal left M2 with resultant small SAH left sylvian fissure, infarct embolic secondary to unknown source.  2D echo unremarkable.  LE venous Doppler negative.  Loop recorder placed to rule out A. fib as potential stroke etiology.  Recommended DAPT for 3 weeks and aspirin alone.  Initiated atorvastatin 40 mg daily with LDL 120.  HTN stable.  Controlled DM with A1c 7.0.  Current tobacco use with smoking cessation counseling provided.  Other stroke risk factors include advanced age, EtOH use and obesity but no prior history of stroke.  Discharged back to [facility] with recommendation of physical therapy.  Stroke: L MCA infarct due to left M2 occlusion s/p tPA and IR with resultant small SAH left sylvian fissure, infarct embolic secondary to unknown source Code Stroke CT head hyperdense L MCA. ASPECTS 10.    CTA head & neck L M2 MCA ELVO. L ICA 50% stenosis, mild L siphon plaque. Mild atherosclerosis throughout CT perfusion 7 mL core, 53 mL penumbra L MCA territory  Cerebral angio TICI3 revascularization distal L M2  F/u CT mild SAH L sylvian fissure. No infarct  MRI patchy small L MCA territory infarct (L insula, L frontal operculum, L temporal lobe). Small SAHD L sylvian fissure stable. MRA Unremarkable  2D Echo EF 65-70%. No source of embolus  LE venous doppler No DVT Loop recorder placed 1/13 to look for AF as source of stroke ([place]) LDL 120 HgbA1c 7.0 aspirin 81 mg daily prior to admission, will start aspirin 81 and plavix 75 at d/c. Continue DAPT x 3 weeks then  aspirin alone.   Therapy recommendations: continue OP PT  Disposition:  return to [facility]    ROS:   14 system review of systems performed and negative with exception of those listed in HPI  PMH:  Past Medical History:  Diagnosis Date   Allergy    Anxiety    on meds   Arthritis    on PRN meds   Cataract    bilateral cat. ext. with lens implant- in [place]   Diabetes mellitus without complication (HCC)    diet controlled.  States always has borderline HgbA1C   Endometrial cancer (HCC)    endometrial CA-2019   Family history of colon cancer    daughter 82 colon CA   Family history of genetic mutation for hereditary nonpolyposis colorectal cancer (HNPCC)    GERD (gastroesophageal reflux disease)    on meds   High cholesterol    on meds   History of kidney stones    years    Hypertension    on meds   Neuromuscular disorder (HCC)    Neuropathy    Sensation of pressure in bladder area    Stroke (HCC) 07/17/2019   07/2021 (started on Eliquis in 03/2022)   TIA (transient ischemic attack) 07/2021   Uterine cramping     PSH:  Past Surgical History:  Procedure Laterality Date   burnt the nerves  Right 04/29/2018   in right knee    burnt the nerves  Left 04/22/2018   left knee nerve    COLONOSCOPY  12/2018   [place]- 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   cystoscopy kidney stone   1985   HYSTERECTOMY ABDOMINAL WITH SALPINGO-OOPHORECTOMY  2019   IR CT HEAD LTD  07/18/2019   IR IMAGING GUIDED PORT INSERTION  06/15/2018   IR PERCUTANEOUS ART THROMBECTOMY/INFUSION INTRACRANIAL INC DIAG ANGIO  07/18/2019   IR REMOVAL TUN ACCESS W/ PORT W/O FL MOD SED  12/01/2018   LOOP RECORDER INSERTION N/A 07/19/2019   Procedure: LOOP RECORDER INSERTION;  Surgeon: Regan Lemming, MD;  Location: [facility] INVASIVE CV LAB;  Service: Cardiovascular;  Laterality: N/A;   POLYPECTOMY     RADIOLOGY WITH ANESTHESIA N/A 07/17/2019   Procedure: IR WITH ANESTHESIA;  Surgeon: Julieanne Cotton, MD;   Location: [facility] OR;  Service: Radiology;  Laterality: N/A;   REPLACEMENT TOTAL KNEE BILATERAL Bilateral 2018   Right x 2. 2019 undergoing intermittent injection "RFA" procedures with Dr. Petra Kuba [facility]   REVISION TOTAL KNEE ARTHROPLASTY Right 03/2017   ROBOTIC ASSISTED TOTAL HYSTERECTOMY WITH BILATERAL SALPINGO OOPHERECTOMY N/A 05/03/2018   Procedure: XI ROBOTIC ASSISTED TOTAL HYSTERECTOMY WITH BILATERAL SALPINGO OOPHORECTOMY;  Surgeon: Adolphus Birchwood, MD;  Location: [facility] ORS;  Service: Gynecology;  Laterality: N/A;   SENTINEL NODE BIOPSY N/A 05/03/2018   Procedure: SENTINEL NODE BIOPSY;  Surgeon: Adolphus Birchwood, MD;  Location: [facility] ORS;  Service: Gynecology;  Laterality: N/A;   TONSILLECTOMY AND ADENOIDECTOMY     TUBAL LIGATION      Social History:  Social History   Socioeconomic History   Marital status: Divorced    Spouse name: Not on file   Number of children: 2   Years of education: Not on file   Highest education level: Not on file  Occupational History   Occupation: retired Engineer, civil (consulting)  Tobacco Use   Smoking status: Every Day    Current packs/day: 0.25    Average packs/day: 0.3 packs/day for 40.0 years (10.0 ttl pk-yrs)    Types: Cigarettes   Smokeless tobacco: Never   Tobacco comments:    4-5 loose cigarettes per day , over 20 year hx of smoking   Vaping Use   Vaping status: Never Used  Substance and Sexual Activity   Alcohol use: Yes    Alcohol/week: 14.0 standard drinks of alcohol    Types: 14 Glasses of wine per week    Comment: about 2 glasses wine daily.    Drug use: Never   Sexual activity: Not Currently    Birth control/protection: None  Other Topics Concern  Not on file  Social History Narrative   Lives alone   Social Drivers of Health   Financial Resource Strain: Low Risk  (01/19/2022)   [facility] to [facility]05[facility]2022[facility] visits prior to 09/05/2022.   Overall  Financial Resource Strain (CARDIA)    Difficulty of Paying Living Expenses: Not hard at all  Food Insecurity: No Food Insecurity (01/19/2022)   [facility] prior to 09/05/2022., [facility] visits prior to 09/05/2022., [facility]   Hunger Vital Sign    Worried About Running Out of Food in the Last Year: Never true    Ran Out of Food in the Last Year: Never true  Transportation Needs: No Transportation Needs (01/19/2022)   Received from [facility] - Transportation  Physical Activity: Insufficiently Active (01/19/2022)   [facility] visits prior to 09/05/2022., [facility] visits prior to 09/05/2022.   Exercise Vital Sign    Days of Exercise per Week: 3 days    Minutes of Exercise per Session: 40 min  Stress: No Stress Concern Present (01/19/2022)   [facility] visits prior to 09/05/2022., [facility] visits prior to 09/05/2022.   [employer] of Occupational Health - Occupational Stress Questionnaire    Feeling of Stress : Only a little  Social Connections: Moderately Isolated (01/19/2022)   Received from [facility] visits prior to 09/05/2022., [facility] visits prior to 09/05/2022.   Social Connection and Isolation Panel [NHANES]    Frequency of Communication with Friends and Family: More than three times a week    Frequency of Social Gatherings with Friends and Family: Three times a week    Attends Religious Services: More than 4 times per year    Active Member of Clubs or Organizations: No    Attends Banker Meetings: Never    Marital Status: Divorced  Catering manager Violence: Not At Risk (01/19/2022)   [facility] visits prior to 09/05/2022., [facility] visits prior to 09/05/2022.   Humiliation, Afraid, Rape, and Kick questionnaire    Fear of Current or Ex-Partner: No    Emotionally Abused: No    Physically Abused: No    Sexually Abused: No    Family History:  Family History  Adopted: Yes  Problem Relation Age of Onset   Colon cancer Daughter 16       Lynch syndrome - MSH6 +   Colon polyps Daughter 4   Healthy Son    Esophageal cancer Neg Hx    Rectal cancer Neg Hx    Stomach cancer Neg Hx     Medications:   Current Outpatient Medications on File Prior to Visit  Medication Sig Dispense Refill   acetaminophen (TYLENOL) 500 MG tablet Take 1,000 mg by mouth once as needed for mild pain or moderate pain. Extra strength     amitriptyline (ELAVIL) 10 MG tablet Take 1 tablet (10 mg total) by mouth at bedtime. 90 tablet 3   amitriptyline (ELAVIL) 25 MG tablet Take 1 tablet (25 mg total) by mouth at bedtime. 90 tablet 3   amoxicillin (AMOXIL) 500 MG capsule Take 2,000  mg by mouth once as needed (dental work).     atenolol (TENORMIN) 50 MG tablet Take 75 mg by mouth daily.     atorvastatin (LIPITOR) 80 MG tablet Take 80 mg by mouth daily.     Biotin 5000 MCG TABS Take 5,000 mcg by mouth daily.     clotrimazole (LOTRIMIN) 1 % cream Apply 1 Application topically as needed.     Coenzyme Q10 (CO Q 10 PO) Take 200 mg by mouth daily.     cyclobenzaprine (FLEXERIL) 5 MG tablet Take 5 mg by mouth 3 (three) times daily as needed for muscle spasms.     ELIQUIS 5 MG TABS tablet TAKE 1 TABLET BY MOUTH TWICE A DAY 60 tablet 9   hydrocortisone 2.5 % cream Apply 1 Application topically as needed.     Lancets (ONETOUCH DELICA PLUS LANCET33G) MISC Apply topically daily.     lisinopril (ZESTRIL) 10 MG tablet Take 10 mg by mouth daily.     metFORMIN (GLUCOPHAGE) 500 MG tablet TAKE 1 TABLET BY MOUTH TWICE A DAY WITH MEALS (Patient taking differently: Take 1,000 mg by mouth 2 (two) times  daily with a meal.) 60 tablet 1   niacin (VITAMIN B3) 250 MG tablet Take 250 mg by mouth at bedtime.     Omega-3 Fatty Acids (FISH OIL PO) Take 1,400 mg by mouth daily.     omeprazole (PRILOSEC) 40 MG capsule Take 1 capsule (40 mg total) by mouth daily. 90 capsule 3   ONETOUCH ULTRA test strip USE TO CHECK BLOOD SUGAR ONCE DAILY E11.69     Propylene Glycol 0.6 % SOLN Place 2 drops into both eyes 4 (four) times daily.     senna-docusate (SENOKOT-S) 8.6-50 MG tablet Take 1 tablet by mouth as needed for mild constipation.     No current facility-administered medications on file prior to visit.    Allergies:   Allergies  Allergen Reactions   Amlodipine     Blurred vision    Compazine [Prochlorperazine Edisylate]     Stroke symptoms,   Gabapentin     Blurred vision and nystagmus   Lyrica [Pregabalin]     Blurred Vision    Prochlorperazine Other (See Comments)    Could not tolerate   Sertraline Other (See Comments)    Visual disturbances.    Chlorhexidine Rash    Blisters and redness to chest      Physical Exam  Vitals:   08/31/23 1320  BP: (!) 141/76  Pulse: 76  Weight: 169 lb 9.6 oz (76.9 kg)  Height: 5\' 4"  (1.626 m)   Body mass index is 29.11 kg/m. No results found.  General: well developed, well nourished, pleasant middle-age Caucasian female, seated, in no evident distress Head: head normocephalic and atraumatic.   Neck: supple with no carotid or supraclavicular bruits Cardiovascular: regular rate and rhythm, no murmurs Skin:  no rash/petichiae Vascular:  Normal pulses all extremities   Neurologic Exam Mental Status: Awake and fully alert.  Fluent speech and language.  Oriented to place and time. Recent  and remote memory intact. Attention span, concentration and fund of knowledge appropriate during visit. Mood and affect appropriate.  Cranial Nerves: Pupils equal, briskly reactive to light. Extraocular movements full without nystagmus. Visual fields full to  confrontation. Hearing intact. Facial sensation intact. Face, tongue, palate moves normally and symmetrically.  Motor: Normal bulk and tone. Normal strength in all tested extremity muscles. Sensory: intact to touch, pinprick, position and vibratory sensation.  Coordination: Rapid  alternating movements normal in all extremities. Finger-to-nose and heel-to-shin performed accurately bilaterally. Gait and Station: Arises from chair without difficulty. Stance is normal. Gait demonstrates normal stride length and balance without use of assistive device Reflexes: 1+ and symmetric. Toes downgoing.          ASSESSMENT: Jordan Martinez is a 73 y.o. 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 Vascular risk factors include history of endometrial cancer with completion of chemo 2019, HTN, HLD, and DM.  Evaluated by Dr. Pearlean Brownie 12/18/2020 for 77-month onset of intermittent right facial numbness and tingling of unknown etiology. Recurrent to embolic small left MCA and PCA infarcts in 07/2021.  Evidence of atrial fibrillation on ILR in 03/2022 and since placed on Eliquis       PLAN:  Recurrent left MCA strokes Continue Eliquis 5 mg BID for secondary stroke prevention and atrial fibrillation managed/prescribed by cardiology Recommend switching atorvastatin 80 mg daily to rosuvastatin 40 mg daily with recent LDL 90, plans on repeat lipid panel with PCP in July. If remains elevated, consider adding Zetia or switching to PCSK9 inhibitor Loop recorder placed 07/2019 -evidence of A-fib 03/2022.  Continue close PCP follow-up for aggressive stroke risk factor management including BP goal<130/90, HLD with LDL goal<70 and DM with A1c.<7  Discussed importance of complete tobacco cessation  Face/neck paresthesias:  Has since resolved Unknown etiology, onset 10/2020 Continue  amitriptyline 35 mg nightly Work up unremarkable  Chronic migraines post chemo:  Currently well controlled  Continue amitriptyline 35 mg nightly - refill provided  Carotid stenosis CTA head/neck 07/2019 left carotid 50% stenosis, right carotid bifurcation with mild cervical right ICA plaque Carotid ultrasound 04/2020 bilateral ICA 1 to 39% stenosis Repeat carotid ultrasound     Follow up in 1 year or call earlier if needed   CC:  Karna Dupes, MD    I spent 45 minutes of face-to-face and non-face-to-face time with patient.  This included previsit chart review, lab review, study review, order entry, electronic health record documentation, patient education and discussion regarding above diagnoses and treatment plan and answered all the questions to patient's satisfaction  Ihor Austin, [facility]  [facility] [address] [place], [place] [address]  Phone [phone] Fax [phone] Note: This document was prepared with digital dictation and possible smart phrase technology. Any transcriptional errors that result from this process are unintentional.

## 2023-09-08 ENCOUNTER — Ambulatory Visit (HOSPITAL_COMMUNITY): Payer: Medicare Other

## 2023-09-15 ENCOUNTER — Ambulatory Visit (HOSPITAL_COMMUNITY)
Admission: RE | Admit: 2023-09-15 | Discharge: 2023-09-15 | Disposition: A | Discharge status: Home / Self Care | Payer: Medicare Other | Source: Ambulatory Visit | Attending: Adult Health | Admitting: Adult Health

## 2023-09-15 DIAGNOSIS — I6523 Occlusion and stenosis of bilateral carotid arteries: Secondary | ICD-10-CM | POA: Insufficient documentation

## 2023-09-20 ENCOUNTER — Ambulatory Visit (HOSPITAL_COMMUNITY)
Admission: RE | Admit: 2023-09-20 | Discharge: 2023-09-20 | Disposition: A | Discharge status: Home / Self Care | Payer: Medicare Other | Source: Ambulatory Visit | Attending: Internal Medicine | Admitting: Internal Medicine

## 2023-09-20 VITALS — BP 148/80 | HR 74 | Ht 64.0 in | Wt 170.2 lb

## 2023-09-20 DIAGNOSIS — I480 Paroxysmal atrial fibrillation: Secondary | ICD-10-CM | POA: Diagnosis not present

## 2023-09-20 DIAGNOSIS — Z7901 Long term (current) use of anticoagulants: Secondary | ICD-10-CM | POA: Insufficient documentation

## 2023-09-20 DIAGNOSIS — Z8673 Personal history of transient ischemic attack (TIA), and cerebral infarction without residual deficits: Secondary | ICD-10-CM | POA: Insufficient documentation

## 2023-09-20 DIAGNOSIS — Z7984 Long term (current) use of oral hypoglycemic drugs: Secondary | ICD-10-CM | POA: Insufficient documentation

## 2023-09-20 DIAGNOSIS — D6869 Other thrombophilia: Secondary | ICD-10-CM

## 2023-09-20 DIAGNOSIS — E119 Type 2 diabetes mellitus without complications: Secondary | ICD-10-CM | POA: Diagnosis not present

## 2023-09-20 DIAGNOSIS — I10 Essential (primary) hypertension: Secondary | ICD-10-CM | POA: Insufficient documentation

## 2023-09-20 NOTE — Progress Notes (Signed)
 Primary Care Physician: Karna Dupes, MD Referring Physician: Device clinic EP: Dr. 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 She had a linq implanted after the CVA in 2021, and just recently showed afib, 5 episodes, all on 9/10, with longest episode lasting 1 hour. Pt does not know any reason or trigger occuring on the 10 th.   She was referred to afib clinic, 9/13, for new onset afib for change of anticoagulation with h/o prior stroke.  She has been on Plavix since her stroke in January of this year, prior to that asa daily. She has tolerated Plavix without issues with bleeding.   In the clinic today, she is in SR. We discussed changing  anticoagulation to be more specific to prevent stroke. I discussed with Ihor Austin, NP, who follows pt in neurology, and she has no issues to stop plavix and start eliquis 5 mg bid. I discussed this drug, risk vrs benefit with pt and significant other, and she is in agreement to change.   F/u in afib clinic, 04/16/22. She is doing well on eliquis, has not noted a change with use of eliquis. Linq reviewed and no afib noted.   F/u in Afib clinic, 09/20/23. She is currently in NSR. ILR review by Dr. Elberta Fortis on 08/21/23 showed no new Afib episodes. No missed doses of Eliquis 5 mg BID. She gets blood work done at her retirement community every 6 months and just had it done 2 weeks ago. She is still interested in the Watchman procedure due to price of Eliquis.    Today, she denies symptoms of palpitations, chest pain, shortness of breath, orthopnea, PND, lower extremity edema, dizziness, presyncope, syncope, or neurologic sequela. The patient is tolerating medications without difficulties and is otherwise without complaint today.   Past Medical History:   Diagnosis Date   Allergy    Anxiety    on meds   Arthritis    on PRN meds   Cataract    bilateral cat. ext. with lens implant- in [place]   Diabetes mellitus without complication (HCC)    diet controlled. States always has borderline HgbA1C   Endometrial cancer (HCC)    endometrial CA-2019   Family history of colon cancer    daughter 3 colon CA   Family history of genetic mutation for hereditary nonpolyposis colorectal cancer (HNPCC)    GERD (gastroesophageal reflux disease)    on meds   High cholesterol    on meds   History of kidney stones    years    Hypertension    on meds   Neuromuscular disorder (HCC)    Neuropathy    Sensation of pressure in bladder area    Stroke (HCC) 07/17/2019   07/2021 (started on Eliquis in 03/2022)   TIA (transient ischemic attack) 07/2021   Uterine cramping    Past Surgical History:  Procedure Laterality Date   burnt the nerves  Right 04/29/2018   in right knee    burnt the nerves  Left 04/22/2018   left knee nerve    COLONOSCOPY  12/2018   [place]- 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   cystoscopy kidney stone   1985   HYSTERECTOMY ABDOMINAL WITH SALPINGO-OOPHORECTOMY  2019   IR CT HEAD LTD  07/18/2019   IR  IMAGING GUIDED PORT INSERTION  06/15/2018   IR PERCUTANEOUS ART THROMBECTOMY/INFUSION INTRACRANIAL INC DIAG ANGIO  07/18/2019   IR REMOVAL TUN ACCESS W/ PORT W/O FL MOD SED  12/01/2018   LOOP RECORDER INSERTION N/A 07/19/2019   Procedure: LOOP RECORDER INSERTION;  Surgeon: Regan Lemming, MD;  Location: [facility] INV[facility]IVE [facility];  Service: Cardiovascular;  Laterality: N/A;   POLYPECTOMY     RADIOLOGY WITH ANESTHESIA N/A 07/17/2019   Procedure: IR WITH ANESTHESIA;  Surgeon: Julieanne Cotton, MD;  Location: [facility] OR;  Service: Radiology;  Laterality: N/A;   REPLACEMENT TOTAL KNEE BILATERAL Bilateral 2018   Right x 2. 2019 undergoing intermittent injection "RFA" procedures with Dr. Petra Kuba [facility]   REVISION TOTAL KNEE  ARTHROPLASTY Right 03/2017   ROBOTIC ASSISTED TOTAL HYSTERECTOMY WITH BILATERAL SALPINGO OOPHERECTOMY N/A 05/03/2018   Procedure: XI ROBOTIC ASSISTED TOTAL HYSTERECTOMY WITH BILATERAL SALPINGO OOPHORECTOMY;  Surgeon: Adolphus Birchwood, MD;  Location: [facility] ORS;  Service: Gynecology;  Laterality: N/A;   SENTINEL NODE BIOPSY N/A 05/03/2018   Procedure: SENTINEL NODE BIOPSY;  Surgeon: Adolphus Birchwood, MD;  Location: [facility] ORS;  Service: Gynecology;  Laterality: N/A;   TONSILLECTOMY AND ADENOIDECTOMY     TUBAL LIGATION      Current Outpatient Medications  Medication Sig Dispense Refill   acetaminophen (TYLENOL) 500 MG tablet Take 1,000 mg by mouth once as needed for mild pain or moderate pain. Extra strength     amitriptyline (ELAVIL) 10 MG tablet Take 1 tablet (10 mg total) by mouth at bedtime. 90 tablet 3   amitriptyline (ELAVIL) 25 MG tablet Take 1 tablet (25 mg total) by mouth at bedtime. 90 tablet 3   amoxicillin (AMOXIL) 500 MG capsule Take 2,000 mg by mouth once as needed (dental work).     atenolol (TENORMIN) 50 MG tablet Take 75 mg by mouth daily.     Biotin 5000 MCG TABS Take 5,000 mcg by mouth daily.     clotrimazole (LOTRIMIN) 1 % cream Apply 1 Application topically as needed.     Coenzyme Q10 (CO Q 10 PO) Take 200 mg by mouth daily.     cyclobenzaprine (FLEXERIL) 5 MG tablet Take 5 mg by mouth 3 (three) times daily as needed for muscle spasms.     ELIQUIS 5 MG TABS tablet TAKE 1 TABLET BY MOUTH TWICE A DAY 60 tablet 9   hydrocortisone 2.5 % cream Apply 1 Application topically as needed.     Lancets (ONETOUCH DELICA PLUS LANCET33G) MISC Apply topically daily.     lisinopril (ZESTRIL) 10 MG tablet Take 10 mg by mouth daily.     metFORMIN (GLUCOPHAGE) 500 MG tablet TAKE 1 TABLET BY MOUTH TWICE A DAY WITH MEALS (Patient taking differently: Take 1,000 mg by mouth 2 (two) times daily with a meal.) 60 tablet 1   niacin (VITAMIN B3) 250 MG tablet Take 250 mg by mouth at bedtime.     Omega-3 Fatty Acids  (FISH OIL PO) Take 1,400 mg by mouth daily.     omeprazole (PRILOSEC) 40 MG capsule Take 1 capsule (40 mg total) by mouth daily. 90 capsule 3   ONETOUCH ULTRA test strip USE TO CHECK BLOOD SUGAR ONCE DAILY E11.69     Propylene Glycol 0.6 % SOLN Place 2 drops into both eyes 4 (four) times daily.     rosuvastatin (CRESTOR) 40 MG tablet Take 1 tablet (40 mg total) by mouth daily. 30 tablet 11   senna-docusate (SENOKOT-S) 8.6-50 MG tablet Take 1 tablet  by mouth as needed for mild constipation.     No current facility-administered medications for this encounter.    Allergies  Allergen Reactions   Amlodipine     Blurred vision    Compazine [Prochlorperazine Edisylate]     Stroke symptoms,   Gabapentin     Blurred vision and nystagmus   Lyrica [Pregabalin]     Blurred Vision    Prochlorperazine Other (See Comments)    Could not tolerate   Sertraline Other (See Comments)    Visual disturbances.    Chlorhexidine Rash    Blisters and redness to chest   ROS- All systems are reviewed and negative except as per the HPI above  Physical Exam: Vitals:   09/20/23 1331  BP: (!) 148/80  Pulse: 74  Weight: 77.2 kg  Height: 5\' 4"  (1.626 m)    Wt Readings from Last 3 Encounters:  09/20/23 77.2 kg  08/31/23 76.9 kg  04/16/23 79.1 kg    Labs: Lab Results  Component Value Date   NA 139 04/16/2022   K 4.6 04/16/2022   CL 106 04/16/2022   CO2 23 04/16/2022   GLUCOSE 127 (H) 04/16/2022   BUN 16 04/16/2022   CREATININE 0.65 04/16/2022   CALCIUM 9.2 04/16/2022   MG 2.4 06/26/2018   Lab Results  Component Value Date   INR 0.9 07/17/2019   Lab Results  Component Value Date   CHOL 224 (H) 07/18/2019   HDL 86 07/18/2019   LDLCALC 120 (H) 07/18/2019   TRIG 91 07/18/2019   GEN- The patient is well appearing, alert and oriented x 3 today.   Neck - no JVD or carotid bruit noted Lungs- Clear to ausculation bilaterally, normal work of breathing Heart- Regular rate and rhythm, no  murmurs, rubs or gallops, PMI not laterally displaced Extremities- no clubbing, cyanosis, or edema Skin - no rash or ecchymosis noted   EKG- Vent. rate 74 BPM PR interval 180 ms QRS duration 90 ms QT/QTcB 414/459 ms P-R-T axes 34 14 18 Normal sinus rhythm RSR' or QR pattern in V1 suggests right ventricular conduction delay Inferior infarct , age undetermined Anterior infarct , age undetermined Abnormal ECG When compared with ECG of 16-Apr-2022 15:21, PREVIOUS ECG IS PRESENT  Echo 07/18/19:  1. Left ventricular ejection fraction, by visual estimation, is 65 to  70%. The left ventricle has hyperdynamic function. There is mildly  increased left ventricular hypertrophy.   2. Left ventricular diastolic parameters are consistent with Grade I  diastolic dysfunction (impaired relaxation).   3. The left ventricle has no regional wall motion abnormalities.   4. Global right ventricle has hyperdynamic systolic function.The right  ventricular size is normal. No increase in right ventricular wall  thickness.   5. Left atrial size was normal.   6. Right atrial size was normal.   7. Mild mitral annular calcification.   8. The mitral valve is normal in structure. No evidence of mitral valve  regurgitation. No evidence of mitral stenosis.   9. The tricuspid valve is normal in structure.  10. The aortic valve is normal in structure. Aortic valve regurgitation is  not visualized. Mild aortic valve sclerosis without stenosis.  11. The pulmonic valve was normal in structure. Pulmonic valve  regurgitation is not visualized.  12. The inferior vena cava is normal in size with greater than 50%  respiratory variability, suggesting right atrial pressure of 3 mmHg.    CHA2DS2-VASc Score = 5  The patient's score is based upon:  CHF History: 0 HTN History: 1 Diabetes History: 0 Stroke History: 2 Vascular Disease History: 0 Age Score: 1 Gender Score: 1       ASSESSMENT AND PLAN: Paroxysmal  Atrial Fibrillation (ICD10:  I48.0) The patient's CHA2DS2-VASc score is 5, indicating a 7.2% annual risk of stroke.    She is currently in NSR. She has what appears to be overall low burden of arrhythmia. Continue conservative observation via subsequent ILR reviews.   History of Afib seen on ILR 03/2022.  Secondary Hypercoagulable State (ICD10:  D68.69) The patient is at significant risk for stroke/thromboembolism based upon her CHA2DS2-VASc Score of 5.  Continue Apixaban (Eliquis).  Continue Eliquis 5 mg BID without interruption. Labs done at retirement facility 2 weeks ago. She has stable chronic anemia Hgb 9.9 outside labs 07/23/22. She is still interested in Watchman procedure and would like to re discuss with Dr. Lalla Brothers due to cost.     Follow up with Afib clinic in 1 year.   Justin Mend, PA-C Afib Clinic [facility] [address] [place], [place] [address] [phone]

## 2023-09-21 NOTE — Addendum Note (Signed)
 Addended by: Elease Etienne A on: 09/21/2023 11:22 AM   Modules accepted: Orders

## 2023-09-21 NOTE — Progress Notes (Signed)
 [facility] Summary Report / Loop Recorder

## 2023-09-24 ENCOUNTER — Ambulatory Visit: Payer: Medicare Other

## 2023-09-24 DIAGNOSIS — I480 Paroxysmal atrial fibrillation: Secondary | ICD-10-CM | POA: Diagnosis not present

## 2023-09-24 LAB — CUP PACEART REMOTE DEVICE CHECK
Date Time Interrogation Session: 20250320230018
Implantable Pulse Generator Implant Date: 20210113

## 2023-10-22 ENCOUNTER — Inpatient Hospital Stay: Payer: Medicare Other | Attending: Gynecologic Oncology | Admitting: Gynecologic Oncology

## 2023-10-22 ENCOUNTER — Encounter: Payer: Self-pay | Admitting: Gynecologic Oncology

## 2023-10-22 VITALS — BP 122/64 | HR 62 | Temp 98.1°F | Resp 16 | Ht 64.0 in | Wt 168.2 lb

## 2023-10-22 DIAGNOSIS — Z90710 Acquired absence of both cervix and uterus: Secondary | ICD-10-CM | POA: Insufficient documentation

## 2023-10-22 DIAGNOSIS — Z90722 Acquired absence of ovaries, bilateral: Secondary | ICD-10-CM | POA: Insufficient documentation

## 2023-10-22 DIAGNOSIS — Z9079 Acquired absence of other genital organ(s): Secondary | ICD-10-CM | POA: Diagnosis not present

## 2023-10-22 DIAGNOSIS — Z148 Genetic carrier of other disease: Secondary | ICD-10-CM | POA: Diagnosis not present

## 2023-10-22 DIAGNOSIS — Z1502 Genetic susceptibility to malignant neoplasm of ovary: Secondary | ICD-10-CM | POA: Diagnosis not present

## 2023-10-22 DIAGNOSIS — Z1509 Genetic susceptibility to other malignant neoplasm: Secondary | ICD-10-CM | POA: Diagnosis not present

## 2023-10-22 DIAGNOSIS — Z9221 Personal history of antineoplastic chemotherapy: Secondary | ICD-10-CM | POA: Insufficient documentation

## 2023-10-22 DIAGNOSIS — Z1501 Genetic susceptibility to malignant neoplasm of breast: Secondary | ICD-10-CM | POA: Diagnosis not present

## 2023-10-22 DIAGNOSIS — C541 Malignant neoplasm of endometrium: Secondary | ICD-10-CM

## 2023-10-22 DIAGNOSIS — Z8542 Personal history of malignant neoplasm of other parts of uterus: Secondary | ICD-10-CM

## 2023-10-22 NOTE — Progress Notes (Signed)
 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 Endometrium, biopsy - HIGH GRADE CARCINOMA, SEE COMMENT. 2. Cervix, biopsy - UNREMARKABLE ENDOCERVICAL GLANDULAR AND SQUAMOUS MUCOSA. - MULTIPLE DETACHED FRAGMENTS OF HIGH GRADE CARCINOMA, MORPHOLOGICALLY SIMILAR TO SPECIMEN 1. Microscopic Comment 1. 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   04/21/2018 Imaging   US  pelvis Abnormally thickened endometrium measuring up to 16 mm. In the setting of post-menopausal bleeding, endometrial sampling is indicated to exclude carcinoma. If results are benign, sonohysterogram should be considered for focal lesion work-up.    04/27/2018 Imaging   Ct abdomen and pelvis 1. Thickening of the endometrium identified compatible with endometrial carcinoma. 2. No findings to suggest metastatic adenopathy or distant metastatic disease. 3. Hiatal hernia 4.  Aortic Atherosclerosis (ICD10-I70.0).     05/02/2018 Tumor Marker   Patient's tumor was tested for the following markers: CA-125 Results of the tumor marker test revealed 65.3   05/03/2018 Pathology Results   1. Lymph node, sentinel, biopsy, right obturator - ONE OF ONE LYMPH NODES NEGATIVE FOR CARCINOMA (0/1). 2. Lymph node, sentinel, biopsy, left external iliac - FOUR OF FOUR LYMPH NODES NEGATIVE FOR CARCINOMA (0/4). 3. 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 TUMOR INVADES  LESS THAN ONE HALF OF THE MYOMETRIUM. SEE ONCOLOGY TABLE. -SEROSA: UNREMARKABLE. NO MALIGNANCY. - CERVIX: BENIGN SQUAMOUS AND ENDOCERVICAL MUCOSA. NO DYSPLASIA OR MALIGNANCY. - BILATERAL OVARIES: INCLUSION CYSTS. NO MALIGNANCY. - BILATERAL FALLOPIAN TUBES: UNREMARKABLE. NO MALIGNANCY. Microscopic Comment 3. UTERUS, CARCINOMA OR CARCINOSARCOMA Procedure: Total hysterectomy with bilateral salpingo-oophorectomy. Right obturator and left external iliac lymph node biopsies. Histologic type: Endometrioid adenocarcinoma with squamous differentiation. Histologic Grade: FIGO grade I. Myometrial invasion: Depth of invasion: 3 mm Myometrial thickness: 12 mm Uterine Serosa Involvement: Not identified. Cervical stromal involvement: Not identified. Extent of involvement of other organs: Uninvolved. Lymphovascular invasion: Not identified. Regional Lymph Nodes: Examined: 5 Sentinel 0 Non-sentinel 5 Total Lymph nodes with metastasis: 0 Isolated tumor cells (< 0.2 mm): 0 Micrometastasis: (> 0.2 mm and < 2.0 mm): 0 Macrometastasis: (> 2.0 mm): 0 Extracapsular extension: N/A. MMR / MSI testing: Will be ordered. Pathologic Stage Classification (pTNM, AJCC 8th edition): pT1a, pN0 FIGO Stage: IA Representative Tumor Block: 3D-G Comment: Pancytokeratin was performed on the lymph nodes and is negative   05/03/2018 Surgery   Operation: Robotic-assisted laparoscopic total hysterectomy with bilateral salpingoophorectomy, SLN biopsy    Surgeon: Eloy Maurilio Fitch    Operative Findings:  : 6cm normal appearing uterus. Normal tubes and ovaries. Normal omentum and diaphragm. No suspicious nodes.      05/03/2018 Pathology Results   PERITONEAL WASHING(SPECIMEN 1 OF 1 COLLECTED 05/03/18): MALIGNANT CELLS CONSISTENT WITH METASTATIC ADENOCARCINOMA.   05/18/2018 Tumor Marker   Patient's tumor was tested for the following markers: CA-125 Results of the tumor marker test revealed 77.5   05/27/2018  Genetic Testing   MSH6 c.2731C>T pathogenic variant and GATA2 c.1232C>T VUS identified on the multicancer panel.  The Multi-Gene Panel offered by [facility] includes sequencing and/or deletion duplication testing of the following 85 genes: AIP,  ALK, APC, ATM, AXIN2,BAP1,  BARD1, BLM, BMPR1A, BRCA1, BRCA2, BRIP1, CASR, CDC73, CDH1, CDK4, CDKN1B, CDKN1C, CDKN2A (p14ARF), CDKN2A (p16INK4a), CEBPA, CHEK2, CTNNA1, DICER1, DIS3L2, EGFR (c.2369C>T, p.Thr790Met variant only), EPCAM (Deletion/duplication testing only), FH, FLCN, GATA2, GPC3, GREM1 (Promoter region deletion/duplication testing only), HOXB13 (c.251G>A, p.Gly84Glu), HRAS, KIT, MAX, MEN1, MET, MITF (c.952G>A, p.Glu318Lys variant only), MLH1, MSH2, MSH3, MSH6, MUTYH, NBN, NF1, NF2, NTHL1, PALB2, PDGFRA, PHOX2B, PMS2, POLD1, POLE, POT1, PRKAR1A, PTCH1, PTEN, RAD50, RAD51C, RAD51D, RB1, RECQL4, RET, RNF43, RUNX1, SDHAF2, SDHA (sequence changes only), SDHB, SDHC, SDHD, SMAD4, SMARCA4, SMARCB1, SMARCE1, STK11, SUFU, TERC, TERT, TMEM127, TP53, TSC1, TSC2, VHL, WRN and WT1.  The report date is 05/27/2018.   06/09/2018 Cancer Staging   Staging form: Corpus Uteri - Carcinoma and Carcinosarcoma, AJCC 8th Edition - Pathologic: Stage I (pT1, pN0, cM0) - Signed by Lonn Hicks, MD on 06/09/2018   06/15/2018 Procedure   Placement of single lumen port a cath via right internal jugular vein. The catheter tip lies at the cavo-atrial junction. A power injectable port a cath was placed and is ready for immediate use.     06/20/2018 - 10/26/2018 Chemotherapy   The patient had carboplatin  and Taxol  x 2 cycles, Cycle 3 Taxol  at 50%, then cycle 4-6 without Taxol , carboplatin  only   06/25/2018 - 06/27/2018 Hospital Admission   She was admitted to the hospital with deydration   11/23/2018 Imaging   1. 8 mm hyperattenuating nodule along the midline vaginal cuff. Coronal imaging suggests that the hyperattenuation is related to calcification, but enhancement in this nodule  cannot be excluded. Patient is status post hysterectomy in the interval since prior CT. While this finding may be related to prior surgery, close follow-up recommended. 2. Otherwise no evidence for metastatic disease in the abdomen or pelvis. 3. Large hiatal hernia with more than 50% of the stomach herniated into the lower chest, stable. 4.  Aortic Atherosclerois (ICD10-170.0)   12/01/2018 Procedure   Successful removal of implanted Port-A-Cath    She noted 3 weeks of postmenopausal bleeding.  On 03/30/2018 she reports this was quite heavy with cramps.  [place] new to [place] living in a retirement community [place] and she was unable to find a provider in a timely manner.  She was referred by the retirement center to an urgent care center in [facility] who then triaged her to see Dr. Corene.  Given the patient's symptoms an endometrial biopsy was performed along with a pelvic ultrasound.  Documented exam by Dr. Corene states that there was a lesion at 6:00 on the cervix which was biopsied.  In addition to the endometrial biopsy being performed.  The endometrial biopsy revealed high-grade carcinoma with a comment stating the majority appears to be FIGO grade 3 endometrioid with scattered foci suggestive of serous phenotype.  The cervix revealed unremarkable endocervical glandular and squamous mucosa there were detached fragments of high-grade carcinoma similar to the endometrial biopsy tissue.  Ultrasound revealed a uterus 6.6 x 3 x 3.7 cm with no fibroids endometrium 1.6 cm right ovary normal left ovary not visualized.   The patient is retired orthoptist.  She states her last Pap smear was in March 2019 in [place] .  She states she got a phone call from that office that her Pap smear was normal.   A CT scan was performed preop and noted below.  In summary no evidence of extrauterine disease.   The patient underwent surgical staging with a robotic assisted total  hysterectomy BSO sentinel lymph node biopsy on May 03, 2018.  Due to her symptoms of abdominal bloating, elevated Ca1 25, and her daughter's history of Lynch syndrome her tinea washings were obtained.  Intraoperative findings were otherwise unremarkable and she had no gross extrauterine disease within normal omentum diaphragms and peritoneal surfaces, 6 cm normal-appearing uterus, normal tubes and ovaries, no suspicious nodes.  The pathology revealed positive malignant cells consistent with metastatic adenocarcinoma in the peritoneal washings.  The histopathology report revealed a FIGO grade 1 endometrioid endometrial adenocarcinoma measuring 2.2 cm and invading less than one half of the myometrium (3 of 12 mm), with no LVSI, and 5- sentinel lymph nodes, with normal cervix and adnexa.  Given the finding of positive washings, the fallopian tubes and ovaries were subsequently submitted for complete serial sectioning and no occult ovarian or fallopian tube carcinoma was identified.  MSI testing showed high instability, and mismatch repair protein IHC was abnormal with loss of nuclear expression of MSH 6.   She was subsequently seen by the genetics counselors on 05/18/2018 who drew blood for genetic screening including Lynch syndrome which was positive (mutation in MSH6).   G iven the presence of positive washings and the preoperative biopsy of high grade cancer, she was felt to represent high risk for recurrence, and therefore adjuvant chemotherapy with carboplatin  and paclitaxel  was recommended. She received chemotherapy between June 20, 2018 and October 26, 2018.  Chemotherapy was somewhat poorly tolerated by the patient who had severe bone pain, and issues with diabetes and dehydration.  She received 2 cycles of carboplatin  with paclitaxel , a third cycle with Taxol  at 50%, and then cycle 4-6 without Taxol  carboplatin  as a single agent.   Post treatment imaging on Nov 23, 2018 revealed an 8 mm  hyperattenuating nodule along the midline vaginal cuff this is well-circumscribed.  It appeared related to calcification.  Otherwise no evidence of metastatic disease in the abdomen and pelvis.   A follow-up PET/CT was ordered and performed on 02/23/2021 reassess the area at the vaginal cuff that had previously shown some nodularity and avidity.  On this follow-up PET scan there was no metabolic evidence of recurrent metastatic disease.  There is a stable benign postsurgical change at the hysterectomy margin that had calcified.  No other new disease was visualized.  The PET does show evidence of coronary artery disease and atherosclerosis.   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 She was started on ASA 81 mg. She had a CVA vs TIA in 07/2021.  Interval History: Doing well.  Denies any vaginal eating or discharge.  Denies any abdominal or pelvic pain.  Has occasional stress urinary incontinence.  Reports normal bowel function.  Past Medical/Surgical History: Past Medical History:  Diagnosis Date   Allergy    Anxiety    on meds   Arthritis    on PRN meds   Cataract    bilateral cat. ext. with lens implant- in [place]   Diabetes mellitus without complication (HCC)    diet controlled. States always has borderline HgbA1C   Endometrial cancer (HCC)    endometrial CA-2019   Family history of colon cancer    daughter 37 colon CA   Family history of genetic mutation for hereditary nonpolyposis colorectal cancer (HNPCC)    GERD (gastroesophageal reflux disease)    on meds   High cholesterol    on meds   History of kidney stones  years    Hypertension    on meds   Neuromuscular disorder (HCC)    Neuropathy    Sensation of pressure in bladder area    Stroke (HCC) 07/17/2019   07/2021 (started on Eliquis  in 03/2022)   TIA (transient ischemic attack) 07/2021   Uterine cramping     Past Surgical History:  Procedure Laterality Date    burnt the nerves  Right 04/29/2018   in right knee    burnt the nerves  Left 04/22/2018   left knee nerve    COLONOSCOPY  12/2018   [place]- 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   cystoscopy kidney stone   1985   HYSTERECTOMY ABDOMINAL WITH SALPINGO-OOPHORECTOMY  2019   IR CT HEAD LTD  07/18/2019   IR IMAGING GUIDED PORT INSERTION  06/15/2018   IR PERCUTANEOUS ART THROMBECTOMY/INFUSION INTRACRANIAL INC DIAG ANGIO  07/18/2019   IR REMOVAL TUN ACCESS W/ PORT W/O FL MOD SED  12/01/2018   LOOP RECORDER INSERTION N/A 07/19/2019   Procedure: LOOP RECORDER INSERTION;  Surgeon: Inocencio Soyla Lunger, MD;  Location: [facility];  Service: Cardiovascular;  Laterality: N/A;   POLYPECTOMY     RADIOLOGY WITH ANESTHESIA N/A 07/17/2019   Procedure: IR WITH ANESTHESIA;  Surgeon: Dolphus Carrion, MD;  Location: [facility] OR;  Service: Radiology;  Laterality: N/A;   REPLACEMENT TOTAL KNEE BILATERAL Bilateral 2018   Right x 2. 2019 undergoing intermittent injection RFA procedures with Dr. Joane Cave [facility]   REVISION TOTAL KNEE ARTHROPLASTY Right 03/2017   ROBOTIC ASSISTED TOTAL HYSTERECTOMY WITH BILATERAL SALPINGO OOPHERECTOMY N/A 05/03/2018   Procedure: XI ROBOTIC ASSISTED TOTAL HYSTERECTOMY WITH BILATERAL SALPINGO OOPHORECTOMY;  Surgeon: Eloy Herring, MD;  Location: [facility] OR[facility];  Service: Gynecology;  Laterality: N/A;   SENTINEL NODE BIOPSY N/A 05/03/2018   Procedure: SENTINEL NODE BIOPSY;  Surgeon: Eloy Herring, MD;  Location: [facility];  Service: Gynecology;  Laterality: N/A;   TONSILLECTOMY AND ADENOIDECTOMY     TUBAL LIGATION      Family History  Adopted: Yes  Problem Relation Age of Onset   Colon cancer Daughter 41       Lynch syndrome - MSH6 +   Colon polyps Daughter 60   Healthy Son    Esophageal cancer Neg Hx    Rectal cancer Neg Hx    Stomach cancer Neg Hx     Social History   Socioeconomic History   Marital status: Divorced    Spouse name: Not on file   Number of  children: 2   Years of education: Not on file   Highest education level: Not on file  Occupational History   Occupation: retired engineer, civil (consulting)  Tobacco Use   Smoking status: Every Day    Current packs/day: 0.25    Average packs/day: 0.3 packs/day for 40.0 years (10.0 ttl pk-yrs)    Types: Cigarettes   Smokeless tobacco: Never   Tobacco comments:    4-5 loose cigarettes per day , over 20 year hx of smoking   Vaping Use   Vaping status: Never Used  Substance and Sexual Activity   Alcohol use: Yes    Alcohol/week: 14.0 standard drinks of alcohol    Types: 14 Glasses of wine per week    Comment: about 2 glasses wine daily.    Drug use: Never   Sexual activity: Not Currently    Birth control/protection: None  Other Topics Concern   Not on file  Social History Narrative   Lives alone  Social Drivers of Health   Financial Resource Strain: Low Risk  (01/19/2022)   [facility]05[facility] visits prior to 09/05/2022.   Overall Financial Resource Strain (CARDIA)    Difficulty of Paying Living Expenses: Not hard at all  Food Insecurity: No Food Insecurity (01/19/2022)   [facility] to 09/05/2022[facility] visits prior to 09/05/2022., [facility]   Hunger Vital Sign    Worried About Running Out of Food in the Last Year: Never true    Ran Out of Food in the Last Year: Never true  Transportation Needs: No Transportation Needs (01/19/2022)   Received from [facility] - Transportation  Physical Activity: Insufficiently Active (01/19/2022)   [facility] prior to 09/05/2022., [facility] visits prior to 09/05/2022.   Exercise Vital Sign    Days of Exercise per Week: 3 days    Minutes of Exercise per  Session: 40 min  Stress: No Stress Concern Present (01/19/2022)   [facility] prior to 09/05/2022[facility] visits prior to 09/05/2022.   [employer] of Occupational Health - Occupational Stress Questionnaire    Feeling of Stress : Only a little  Social Connections: Moderately Isolated (01/19/2022)   [facility] from [facility] visits prior to 09/05/2022., [facility] visits prior to 09/05/2022.   Social Connection and Isolation Panel [NHANES]    Frequency of Communication with Friends and Family: More than three times a week    Frequency of Social Gatherings with Friends and Family: Three times a week    Attends Religious Services: More than 4 times per year    Active Member of Clubs or Organizations: No    Attends Banker Meetings: Never    Marital Status: Divorced    Current Medications:  Current Outpatient Medications:    acetaminophen  (TYLENOL ) 500 MG tablet, Take 1,000 mg by mouth once as needed for mild pain or moderate pain. Extra strength, Disp: , Rfl:    amitriptyline  (ELAVIL ) 10 MG tablet, Take 1 tablet (10 mg total) by mouth at bedtime., Disp: 90 tablet, Rfl: 3   amitriptyline  (ELAVIL ) 25 MG tablet, Take 1 tablet (25 mg total) by mouth at bedtime., Disp: 90 tablet, Rfl: 3   amoxicillin (AMOXIL) 500 MG capsule, Take 2,000 mg by mouth once as needed (dental work)., Disp: , Rfl:    atenolol  (TENORMIN ) 50 MG tablet, Take 75 mg by mouth daily., Disp: , Rfl:    Biotin 5000 MCG TABS, Take 5,000 mcg by mouth daily., Disp: , Rfl:    clotrimazole (LOTRIMIN) 1 % cream, Apply 1 Application topically as needed., Disp: , Rfl:    Coenzyme Q10 (CO Q 10 PO), Take 200 mg by mouth daily., Disp: , Rfl:    ELIQUIS  5 MG TABS tablet, TAKE 1 TABLET BY MOUTH TWICE A DAY, Disp: 60 tablet, Rfl: 9   hydrocortisone 2.5 % cream,  Apply 1 Application topically as needed., Disp: , Rfl:    Lancets (ONETOUCH DELICA PLUS LANCET33G) MISC, Apply topically daily., Disp: , Rfl:    lisinopril  (ZESTRIL ) 10 MG tablet, Take 10 mg by mouth daily., Disp: , Rfl:  metFORMIN  (GLUCOPHAGE ) 1000 MG tablet, Take by mouth., Disp: , Rfl:    metFORMIN  (GLUCOPHAGE -XR) 750 MG 24 hr tablet, Take 750 mg by mouth 2 (two) times daily., Disp: , Rfl:    niacin (VITAMIN B3) 250 MG tablet, Take 250 mg by mouth at bedtime., Disp: , Rfl:    Omega-3 Fatty Acids (FISH OIL PO), Take 1,400 mg by mouth daily., Disp: , Rfl:    omeprazole  (PRILOSEC) 40 MG capsule, Take 1 capsule (40 mg total) by mouth daily., Disp: 90 capsule, Rfl: 3   ONETOUCH ULTRA test strip, USE TO CHECK BLOOD SUGAR ONCE DAILY E11.69, Disp: , Rfl:    Propylene Glycol 0.6 % SOLN, Place 2 drops into both eyes 4 (four) times daily., Disp: , Rfl:    rosuvastatin  (CRESTOR ) 40 MG tablet, Take 1 tablet (40 mg total) by mouth daily., Disp: 30 tablet, Rfl: 11   senna-docusate (SENOKOT-S) 8.6-50 MG tablet, Take 1 tablet by mouth as needed for mild constipation., Disp: , Rfl:   Review of Systems: + flashes, bruising, anxiety Denies appetite changes, fevers, chills, fatigue, unexplained weight changes. Denies hearing loss, neck lumps or masses, mouth sores, ringing in ears or voice changes. Denies cough or wheezing.  Denies shortness of breath. Denies chest pain or palpitations. Denies leg swelling. Denies abdominal distention, pain, blood in stools, constipation, diarrhea, nausea, vomiting, or early satiety. Denies pain with intercourse, dysuria, frequency, hematuria or incontinence. Denies pelvic pain, vaginal bleeding or vaginal discharge.   Denies joint pain, back pain or muscle pain/cramps. Denies itching, rash, or wounds. Denies dizziness, headaches, numbness or seizures. Denies swollen lymph nodes or glands, denies easy bleeding. Denies depression, confusion, or decreased  concentration.  Physical Exam: BP 122/64 (BP Location: Left Arm, Patient Position: Sitting)   Pulse 62   Temp 98.1 F (36.7 C) (Oral)   Resp 16   Ht 5' 4 (1.626 m)   Wt 168 lb 3.2 oz (76.3 kg)   SpO2 100%   BMI 28.87 kg/m  General: Alert, oriented, no acute distress. HEENT: Normocephalic, atraumatic, sclera anicteric. Chest: Clear to auscultation bilaterally.  Port site clean. Cardiovascular: Regular rate and rhythm, no murmurs. Abdomen: soft, nontender.  Normoactive bowel sounds.  No masses or hepatosplenomegaly appreciated.  Well-healed incisions. Extremities: Grossly normal range of motion.  Warm, well perfused.  No edema bilaterally. Skin: No rashes or lesions noted. Lymphatics: No cervical, supraclavicular, or inguinal adenopathy. GU: Normal appearing external genitalia without erythema, excoriation, or lesions.  Speculum exam reveals mildly atrophic vaginal mucosa, no lesions seen.  Cuff intact.  Bimanual exam reveals no nodularity or masses.  Rectovaginal exam confirms findings.  Laboratory & Radiologic Studies: None new  Assessment & Plan: July Linam is a 73 y.o. woman with a history of Stage IA grade 3 endometrial cancer with + washings who presents for surveillance visit. MSI high, MMR abnormal (loss of MSH 6). 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  S/p 6 cycles platinum and taxane chemotherapy completed April 22nd, 2020.  Complete clinical response, no evidence of disease on exam.  Post-treatment imaging revealed nodule at vaginal cuff (8mm). No concerning findings on examination and stable/benign appearing on follow-up imaging.  Last imaging was in 02/2021 (PET) and negative for evidence of metastatic disease.   Patient is doing well and is NED on exam today.   She is now 5 years out from completion of adjuvant treatment.  We discussed discharge from our practice versus yearly follow-up for several years with Melissa.  Her preference, due  to anxiety, is to  continue to come for a yearly visit.  Reviewed signs and symptoms that would be concerning for cancer recurrence and stressed the importance of calling if she develops any of these.  20 minutes of total time was spent for this patient encounter, including preparation, face-to-face counseling with the patient and coordination of care, and documentation of the encounter.  Comer Dollar, MD  Division of Gynecologic Oncology  Department of Obstetrics and Gynecology  [facility]

## 2023-10-22 NOTE — Patient Instructions (Signed)
 It was good to see you today.  I do not see or feel any evidence of cancer recurrence on your exam.  Jordan Martinez will see you for follow-up in 12 months.  As always, if you develop any new and concerning symptoms before your next visit, please call to see me sooner.

## 2023-10-25 ENCOUNTER — Ambulatory Visit: Admitting: Gynecologic Oncology

## 2023-10-29 ENCOUNTER — Ambulatory Visit (INDEPENDENT_AMBULATORY_CARE_PROVIDER_SITE_OTHER): Payer: Medicare Other

## 2023-10-29 DIAGNOSIS — I480 Paroxysmal atrial fibrillation: Secondary | ICD-10-CM

## 2023-10-29 LAB — CUP PACEART REMOTE DEVICE CHECK
Date Time Interrogation Session: 20250424230340
Implantable Pulse Generator Implant Date: 20210113

## 2023-11-03 NOTE — Progress Notes (Signed)
 [facility] Summary Report / Loop Recorder

## 2023-11-23 ENCOUNTER — Telehealth: Payer: Self-pay | Admitting: Gastroenterology

## 2023-11-23 NOTE — Telephone Encounter (Signed)
 Inbound call from patient requesting to speak with nurse to discuss if it is necessary for her to have her follow up colonoscopy this year. Please advise, thank you.

## 2023-11-23 NOTE — Telephone Encounter (Signed)
 The pt has been advised that she needs an appt with a provider at our office to discuss if colon is needed due to Dr Savannah Curlin not being at this practice any longer. She will review the providers and call back to make appt after she decides who she would like to see.

## 2023-12-03 ENCOUNTER — Ambulatory Visit: Payer: Self-pay | Admitting: Cardiology

## 2023-12-03 ENCOUNTER — Ambulatory Visit (INDEPENDENT_AMBULATORY_CARE_PROVIDER_SITE_OTHER): Payer: Medicare Other

## 2023-12-03 DIAGNOSIS — I480 Paroxysmal atrial fibrillation: Secondary | ICD-10-CM

## 2023-12-03 LAB — CUP PACEART REMOTE DEVICE CHECK
Date Time Interrogation Session: 20250529231835
Implantable Pulse Generator Implant Date: 20210113

## 2023-12-06 NOTE — Progress Notes (Signed)
 [facility] Summary Report / Loop Recorder

## 2024-01-01 ENCOUNTER — Encounter (HOSPITAL_COMMUNITY): Payer: Self-pay | Admitting: Interventional Radiology

## 2024-01-03 ENCOUNTER — Ambulatory Visit

## 2024-01-03 ENCOUNTER — Ambulatory Visit: Payer: Self-pay | Admitting: Cardiology

## 2024-01-03 DIAGNOSIS — I480 Paroxysmal atrial fibrillation: Secondary | ICD-10-CM

## 2024-01-03 LAB — CUP PACEART REMOTE DEVICE CHECK
Date Time Interrogation Session: 20250629230352
Implantable Pulse Generator Implant Date: 20210113

## 2024-01-14 LAB — LAB REPORT - SCANNED
A1c: 7.2
EGFR: 51

## 2024-01-15 ENCOUNTER — Encounter: Payer: Self-pay | Admitting: Adult Health

## 2024-01-18 NOTE — Progress Notes (Signed)
 [facility] Summary Report / Loop Recorder

## 2024-01-27 ENCOUNTER — Ambulatory Visit (INDEPENDENT_AMBULATORY_CARE_PROVIDER_SITE_OTHER): Admitting: Internal Medicine

## 2024-01-27 ENCOUNTER — Encounter: Payer: Self-pay | Admitting: Internal Medicine

## 2024-01-27 VITALS — BP 124/62 | HR 73 | Ht 64.0 in | Wt 162.0 lb

## 2024-01-27 DIAGNOSIS — Z860101 Personal history of adenomatous and serrated colon polyps: Secondary | ICD-10-CM

## 2024-01-27 DIAGNOSIS — Z1509 Genetic susceptibility to other malignant neoplasm: Secondary | ICD-10-CM | POA: Diagnosis not present

## 2024-01-27 DIAGNOSIS — Z7901 Long term (current) use of anticoagulants: Secondary | ICD-10-CM | POA: Diagnosis not present

## 2024-01-27 DIAGNOSIS — I480 Paroxysmal atrial fibrillation: Secondary | ICD-10-CM

## 2024-01-27 DIAGNOSIS — I6602 Occlusion and stenosis of left middle cerebral artery: Secondary | ICD-10-CM

## 2024-01-27 DIAGNOSIS — Z800 Family history of malignant neoplasm of digestive organs: Secondary | ICD-10-CM

## 2024-01-27 DIAGNOSIS — Z860100 Personal history of colon polyps, unspecified: Secondary | ICD-10-CM

## 2024-01-27 DIAGNOSIS — Z8679 Personal history of other diseases of the circulatory system: Secondary | ICD-10-CM

## 2024-01-27 DIAGNOSIS — K219 Gastro-esophageal reflux disease without esophagitis: Secondary | ICD-10-CM | POA: Diagnosis not present

## 2024-01-27 MED ORDER — NA SULFATE-K SULFATE-MG SULF 17.5-3.13-1.6 GM/177ML PO SOLN
1.0000 | Freq: Once | ORAL | 0 refills | Status: AC
Start: 2024-01-27 — End: 2024-01-27

## 2024-01-27 MED ORDER — ONDANSETRON 4 MG PO TBDP: ORAL_TABLET | ORAL | 0 refills | Status: DC

## 2024-01-27 NOTE — Progress Notes (Signed)
 HISTORY OF PRESENT ILLNESS:  Jordan Martinez is a 73 y.o. female with multiple significant medical problems as listed below.  She has a history of atrial fibrillation, CVA, and is on chronic anticoagulation in the form of Eliquis .  Previous patient of Dr. Eda.  She presents today regarding surveillance endoscopic procedures.  She is accompanied by her daughter from [place] .  The patient is a rambling historian.  In short, her daughter was diagnosed with colon cancer in her 30s.  Patient herself has a history of endometrial cancer.  Further investigation revealed the patient's daughter, the patient, and the patient's grand children have the Lynch syndrome gene.  Patient has been getting annual colonoscopies with Dr. Eda.  Last colonoscopy August 04, 2022.  Diminutive adenomas identified.  Her anticoagulation was interrupted for the procedure.  Last upper endoscopy that I see was 2020.  No significant abnormalities.  Follow-up in 3 years recommended.  Patient has no new GI complaints.  Occasional constipation or diarrhea.  Hemorrhoids noted some nausea and bloating.  Does have acid reflux.  On omeprazole .  In terms of prep, issues with significant nausea.  Requesting Zofran .  Medically stable since her last evaluation.  Prior cardiac ejection fraction 60 to 65%.  REVIEW OF SYSTEMS:  All non-GI ROS negative unless otherwise stated in the HPI except for anxiety, arthritis, headaches, irregular heartbeat, muscle cramps, fatigue, urinary leakage  Past Medical History:  Diagnosis Date   Allergy    Anxiety    on meds   Arthritis    on PRN meds   Cataract    bilateral cat. ext. with lens implant- in [place]   Diabetes mellitus without complication (HCC)    diet controlled. States always has borderline HgbA1C   Endometrial cancer (HCC)    endometrial CA-2019   Family history of colon cancer    daughter 43 colon CA   Family history of genetic mutation for hereditary nonpolyposis colorectal cancer  (HNPCC)    GERD (gastroesophageal reflux disease)    on meds   High cholesterol    on meds   History of kidney stones    years    Hypertension    on meds   Neuromuscular disorder (HCC)    Neuropathy    Sensation of pressure in bladder area    Stroke (HCC) 07/17/2019   07/2021 (started on Eliquis  in 03/2022)   TIA (transient ischemic attack) 07/2021   Uterine cramping     Past Surgical History:  Procedure Laterality Date   burnt the nerves  Right 04/29/2018   in right knee    burnt the nerves  Left 04/22/2018   left knee nerve    COLONOSCOPY  12/2018   [place]- 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   cystoscopy kidney stone   1985   HYSTERECTOMY ABDOMINAL WITH SALPINGO-OOPHORECTOMY  2019   IR CT HEAD LTD  07/18/2019   IR IMAGING GUIDED PORT INSERTION  06/15/2018   IR PERCUTANEOUS ART THROMBECTOMY/INFUSION INTRACRANIAL INC DIAG ANGIO  07/18/2019   IR REMOVAL TUN ACCESS W/ PORT W/O FL MOD SED  12/01/2018   LOOP RECORDER INSERTION N/A 07/19/2019   Procedure: LOOP RECORDER INSERTION;  Surgeon: Inocencio Soyla Lunger, MD;  [facility]: [facility];  Service: Cardiovascular;  Laterality: N/A;   POLYPECTOMY     RADIOLOGY WITH ANESTHESIA N/A 07/17/2019   Procedure: IR WITH ANESTHESIA;  Surgeon: Dolphus Carrion, MD;  Location: [facility] OR;  Service: Radiology;  Laterality: N/A;   REPLACEMENT TOTAL KNEE BILATERAL Bilateral  2018   Right x 2. 2019 undergoing intermittent injection RFA procedures with Dr. Joane Cave [facility]   REVISION TOTAL KNEE ARTHROPLASTY Right 03/2017   ROBOTIC ASSISTED TOTAL HYSTERECTOMY WITH BILATERAL SALPINGO OOPHERECTOMY N/A 05/03/2018   Procedure: XI ROBOTIC ASSISTED TOTAL HYSTERECTOMY WITH BILATERAL SALPINGO OOPHORECTOMY;  Surgeon: Eloy Herring, MD;  Location: [facility];  Service: Gynecology;  Laterality: N/A;   SENTINEL NODE BIOPSY N/A 05/03/2018   Procedure: SENTINEL NODE BIOPSY;  Surgeon: Eloy Herring, MD;  Location: [facility];  Service: Gynecology;   Laterality: N/A;   TONSILLECTOMY AND ADENOIDECTOMY     TUBAL LIGATION      Social History Marenda Accardi  reports that she has been smoking cigarettes. She has a 10 pack-year smoking history. She has never used smokeless tobacco. She reports current alcohol use of about 14.0 standard drinks of alcohol per week. She reports that she does not use drugs.  family history includes Colon cancer (age of onset: 68) in her daughter; Colon polyps (age of onset: 57) in her daughter; Healthy in her son. She was adopted.  Allergies  Allergen Reactions   Amlodipine     Blurred vision    Compazine [Prochlorperazine Edisylate]     Stroke symptoms,   Gabapentin     Blurred vision and nystagmus   Lyrica [Pregabalin]     Blurred Vision    Prochlorperazine Other (See Comments)    Could not tolerate   Sertraline Other (See Comments)    Visual disturbances.    Chlorhexidine  Rash    Blisters and redness to chest       PHYSICAL EXAMINATION: Vital signs: BP 124/62   Pulse 73   Ht 5' 4 (1.626 m)   Wt 162 lb (73.5 kg)   BMI 27.81 kg/m   Constitutional: generally well-appearing, no acute distress Psychiatric: alert and oriented x3, cooperative Eyes: extraocular movements intact, anicteric, conjunctiva pink Mouth: oral pharynx moist, no lesions Neck: supple no lymphadenopathy Cardiovascular: heart regular rate and rhythm, no murmur Lungs: clear to auscultation bilaterally Abdomen: soft, nontender, nondistended, no obvious ascites, no peritoneal signs, normal bowel sounds, no organomegaly Rectal: Deferred until colonoscopy Extremities: no clubbing, cyanosis, or lower extremity edema bilaterally Skin: no lesions on visible extremities Neuro: No focal deficits.  Cranial nerves intact  ASSESSMENT:  1.  Lynch syndrome 2.  History of adenomatous polyps 3.  Family history of colon cancer 4.  GERD on PPI 5.  History of A-fib and CVA x 2.  CHA2DS2-VASc score 5.  On Eliquis  6.  History of  endometrial cancer 7.  General Medical problems.  Stable   PLAN:  1.  Schedule surveillance colonoscopy.  The patient is high risk given her comorbidities and the need to address anticoagulation. 2.  Schedule upper endoscopy for surveillance. 3.  Continue PPI 4.  As with previous exam, hold Eliquis  2 days prior to the procedure.  Anticipate immediate resumption postprocedure.  The risks and benefits of temporary holding of anticoagulation reviewed. 5.  Prescribe Zofran  4 mg; #4.  May take prior to and during prep sessions if needed for nausea 6.  Hard candy.  She likes butterscotch 7.  Ongoing general medical care with PCP Total time of 45 minutes was spent preparing to see the patient (reviewing prior endoscopy reports, laboratories, pathology, office notes), obtaining comprehensive history, performing medically appropriate physical exam, counseling and educating the patient and her daughter regarding the above listed issues, ordering medication, ordering multiple endoscopic procedures, adjusting anticoagulation, and documenting clinical information  in the health record

## 2024-01-27 NOTE — Patient Instructions (Signed)
 We have sent the following medications to your pharmacy for you to pick up at your convenience:  Zofran  - Take 1 tablet about 30 minutes before drinking each half of the prep.  You may take another one while drinking the prep, as needed.  You have been scheduled for an endoscopy and colonoscopy. Please follow the written instructions given to you at your visit today.  If you use inhalers (even only as needed), please bring them with you on the day of your procedure.  DO NOT TAKE 7 DAYS PRIOR TO TEST- Trulicity (dulaglutide) Ozempic, Wegovy (semaglutide) Mounjaro (tirzepatide) Bydureon Bcise (exanatide extended release)  DO NOT TAKE 1 DAY PRIOR TO YOUR TEST Rybelsus (semaglutide) Adlyxin (lixisenatide) Victoza (liraglutide) Byetta (exanatide) ___________________________________________________________________________ _______________________________________________________  If your blood pressure at your visit was 140/90 or greater, please contact your primary care physician to follow up on this.  _______________________________________________________  If you are age 73 or older, your body mass index should be between 23-30. Your Body mass index is 27.81 kg/m. If this is out of the aforementioned range listed, please consider follow up with your Primary Care Provider.  If you are age 73 or younger, your body mass index should be between 19-25. Your Body mass index is 27.81 kg/m. If this is out of the aformentioned range listed, please consider follow up with your Primary Care Provider.   ________________________________________________________  The [facility] providers would like to encourage you to use MYCHART to communicate with providers for non-urgent requests or questions.  Due to long hold times on the telephone, sending your provider a message by [facility] may be a faster and more efficient way to get a response.  Please allow 48 business hours for a response.  Please remember  that this is for non-urgent requests.  _______________________________________________________  [facility] is using a team-based approach to care.  Your team is made up of your doctor and two to three APPS. Our APPS (Nurse Practitioners and Physician Assistants) work with your physician to ensure care continuity for you. They are fully qualified to address your health concerns and develop a treatment plan. They communicate directly with your gastroenterologist to care for you. Seeing the Advanced Practice Practitioners on your physician's team can help you by facilitating care more promptly, often allowing for earlier appointments, access to diagnostic testing, procedures, and other specialty referrals.

## 2024-02-02 DIAGNOSIS — M8580 Other specified disorders of bone density and structure, unspecified site: Secondary | ICD-10-CM | POA: Insufficient documentation

## 2024-02-03 ENCOUNTER — Ambulatory Visit (INDEPENDENT_AMBULATORY_CARE_PROVIDER_SITE_OTHER)

## 2024-02-03 DIAGNOSIS — I480 Paroxysmal atrial fibrillation: Secondary | ICD-10-CM

## 2024-02-03 LAB — CUP PACEART REMOTE DEVICE CHECK
Date Time Interrogation Session: 20250730230354
Implantable Pulse Generator Implant Date: 20210113

## 2024-02-04 ENCOUNTER — Ambulatory Visit: Payer: Self-pay | Admitting: Cardiology

## 2024-02-22 ENCOUNTER — Telehealth: Payer: Self-pay | Admitting: Internal Medicine

## 2024-02-22 ENCOUNTER — Telehealth: Payer: Self-pay

## 2024-02-22 ENCOUNTER — Encounter: Payer: Self-pay | Admitting: Internal Medicine

## 2024-02-22 NOTE — Telephone Encounter (Signed)
 Patient called and stated that she has has a colonoscopy schedule for August the 26 th and was wondering when she needed to stop taking her Eliquis . Patient is requesting a call back. Please advise.

## 2024-02-22 NOTE — Telephone Encounter (Signed)
 [facility] Pre-operative Risk Assessment     Request for surgical clearance:     Endoscopy Procedure  What type of surgery is being performed?     Endo/colon  When is this surgery scheduled?     02/29/2024  What type of clearance is required ?   Pharmacy  Are there any medications that need to be held prior to surgery and how long? Eliquis  - 2 days  Practice name and name of physician performing surgery?      [facility]  What is your office phone and fax number?      Phone- [phone]  Fax- [phone]  Anesthesia type (None, local, MAC, general) ?       MAC   Please route your response to [place]

## 2024-02-24 NOTE — Telephone Encounter (Signed)
 Please respond to request to hold Eliquis  sent earlier this week - the patient's procedure is Tuesday.

## 2024-02-25 ENCOUNTER — Telehealth: Payer: Self-pay

## 2024-02-25 NOTE — Telephone Encounter (Signed)
 Spoke to patient and let her know that per cardiology, she could hold her Eliquis  for 2 days prior to the procedure.  Patient agreed.

## 2024-02-25 NOTE — Telephone Encounter (Signed)
 Patient with diagnosis of afib on Eliquis  for anticoagulation.    Procedure: Endo/colon  Date of procedure: 02/29/24   CHA2DS2-VASc Score = 5   This indicates a 7.2% annual risk of stroke. The patient's score is based upon: CHF History: 0 HTN History: 1 Diabetes History: 0 Stroke History: 2 Vascular Disease History: 0 Age Score: 1 Gender Score: 1      CrCl 43 ml/min Platelet count 379  Patient with CVA in 2021 and 2023. Afib not detected on Linq until after second stroke  Patient has not had an Afib/aflutter ablation within the last 3 months or DCCV within the last 30 days  Per office protocol, patient can hold Eliquis  for 2 days prior to procedure.    **This guidance is not considered finalized until pre-operative APP has relayed final recommendations.**

## 2024-02-25 NOTE — Telephone Encounter (Signed)
   Patient Name: Jordan Martinez  DOB: [birth year] MRN: [id number]  Primary Cardiologist: None  Clinical pharmacists have reviewed the patient's past medical history, labs, and current medications as part of preoperative protocol coverage. The following recommendations have been made:  Procedure: Endo/colon  Date of procedure: 02/29/24     CHA2DS2-VASc Score = 5   This indicates a 7.2% annual risk of stroke. The patient's score is based upon: CHF History: 0 HTN History: 1 Diabetes History: 0 Stroke History: 2 Vascular Disease History: 0 Age Score: 1 Gender Score: 1     CrCl 43 ml/min Platelet count 379   Patient with CVA in 2021 and 2023. Afib not detected on Linq until after second stroke   Patient has not had an Afib/aflutter ablation within the last 3 months or DCCV within the last 30 days   Per office protocol, patient can hold Eliquis  for 2 days prior to procedure.     I will route this recommendation to the requesting party via Epic fax function and remove from pre-op pool.  Please call with questions.  Lamarr Satterfield, NP 02/25/2024, 2:50 PM

## 2024-02-25 NOTE — Telephone Encounter (Signed)
 Hey - thank you for reaching out to pre-op this morning.  If I don't hear anything today I will have to cancel her procedure that is Tuesday.  Is there any way to get an answer today?  I have also reached out to pre -op

## 2024-02-25 NOTE — Telephone Encounter (Signed)
 Please try to get me an answer today or else I will have to cancel the patient's procedure that is scheduled for next Tuesday.  She is getting very concerned about that possibility

## 2024-02-25 NOTE — Telephone Encounter (Signed)
 Spoke with the surgeon's office regarding Eliquis  hold, requesting urgent response as the patient will need to hold the medication Monday and Tuesday. Please advise.

## 2024-02-29 ENCOUNTER — Ambulatory Visit (AMBULATORY_SURGERY_CENTER): Admitting: Internal Medicine

## 2024-02-29 ENCOUNTER — Encounter: Payer: Self-pay | Admitting: Internal Medicine

## 2024-02-29 VITALS — BP 124/57 | HR 61 | Temp 97.1°F | Resp 11 | Ht 64.0 in | Wt 162.0 lb

## 2024-02-29 DIAGNOSIS — K56699 Other intestinal obstruction unspecified as to partial versus complete obstruction: Secondary | ICD-10-CM | POA: Diagnosis not present

## 2024-02-29 DIAGNOSIS — K449 Diaphragmatic hernia without obstruction or gangrene: Secondary | ICD-10-CM

## 2024-02-29 DIAGNOSIS — Z1211 Encounter for screening for malignant neoplasm of colon: Secondary | ICD-10-CM

## 2024-02-29 DIAGNOSIS — K259 Gastric ulcer, unspecified as acute or chronic, without hemorrhage or perforation: Secondary | ICD-10-CM

## 2024-02-29 DIAGNOSIS — K5730 Diverticulosis of large intestine without perforation or abscess without bleeding: Secondary | ICD-10-CM

## 2024-02-29 DIAGNOSIS — Z860101 Personal history of adenomatous and serrated colon polyps: Secondary | ICD-10-CM

## 2024-02-29 DIAGNOSIS — Z860100 Personal history of colon polyps, unspecified: Secondary | ICD-10-CM

## 2024-02-29 DIAGNOSIS — K219 Gastro-esophageal reflux disease without esophagitis: Secondary | ICD-10-CM

## 2024-02-29 DIAGNOSIS — Z800 Family history of malignant neoplasm of digestive organs: Secondary | ICD-10-CM

## 2024-02-29 DIAGNOSIS — Z1509 Genetic susceptibility to other malignant neoplasm: Secondary | ICD-10-CM | POA: Diagnosis not present

## 2024-02-29 MED ORDER — SODIUM CHLORIDE 0.9 % IV SOLN: 500.0000 mL | INTRAVENOUS | Status: DC

## 2024-02-29 NOTE — Progress Notes (Signed)
 Expand All Collapse All HISTORY OF PRESENT ILLNESS:   Jordan Martinez is a 73 y.o. female with multiple significant medical problems as listed below.  She has a history of atrial fibrillation, CVA, and is on chronic anticoagulation in the form of Eliquis .  Previous patient of Dr. Eda.  She presents today regarding surveillance endoscopic procedures.  She is accompanied by her daughter from [place] .   The patient is a rambling historian.  In short, her daughter was diagnosed with colon cancer in her 30s.  Patient herself has a history of endometrial cancer.  Further investigation revealed the patient's daughter, the patient, and the patient's grand children have the Lynch syndrome gene.  Patient has been getting annual colonoscopies with Dr. Eda.  Last colonoscopy August 04, 2022.  Diminutive adenomas identified.  Her anticoagulation was interrupted for the procedure.  Last upper endoscopy that I see was 2020.  No significant abnormalities.  Follow-up in 3 years recommended.   Patient has no new GI complaints.  Occasional constipation or diarrhea.  Hemorrhoids noted some nausea and bloating.  Does have acid reflux.  On omeprazole .  In terms of prep, issues with significant nausea.  Requesting Zofran .  Medically stable since her last evaluation.  Prior cardiac ejection fraction 60 to 65%.   REVIEW OF SYSTEMS:   All non-GI ROS negative unless otherwise stated in the HPI except for anxiety, arthritis, headaches, irregular heartbeat, muscle cramps, fatigue, urinary leakage       Past Medical History:  Diagnosis Date   Allergy     Anxiety      on meds   Arthritis      on PRN meds   Cataract      bilateral cat. ext. with lens implant- in [place]   Diabetes mellitus without complication (HCC)      diet controlled. States always has borderline HgbA1C   Endometrial cancer (HCC)      endometrial CA-2019   Family history of colon cancer      daughter 58 colon CA   Family history of genetic  mutation for hereditary nonpolyposis colorectal cancer (HNPCC)     GERD (gastroesophageal reflux disease)      on meds   High cholesterol      on meds   History of kidney stones      years    Hypertension      on meds   Neuromuscular disorder (HCC)     Neuropathy     Sensation of pressure in bladder area     Stroke (HCC) 07/17/2019    07/2021 (started on Eliquis  in 03/2022)   TIA (transient ischemic attack) 07/2021   Uterine cramping                 Past Surgical History:  Procedure Laterality Date   burnt the nerves  Right 04/29/2018    in right knee    burnt the nerves  Left 04/22/2018    left knee nerve    COLONOSCOPY   12/2018    [place]- 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   cystoscopy kidney stone    1985   HYSTERECTOMY ABDOMINAL WITH SALPINGO-OOPHORECTOMY   2019   IR CT HEAD LTD   07/18/2019   IR IMAGING GUIDED PORT INSERTION   06/15/2018   IR PERCUTANEOUS ART THROMBECTOMY/INFUSION INTRACRANIAL INC DIAG ANGIO   07/18/2019   IR REMOVAL TUN ACCESS W/ PORT W/O FL MOD SED   12/01/2018   LOOP RECORDER INSERTION N/A 07/19/2019  Procedure: LOOP RECORDER INSERTION;  Surgeon: Inocencio Soyla Lunger, MD;  Location: [facility];  Service: Cardiovascular;  Laterality: N/A;   POLYPECTOMY       RADIOLOGY WITH ANESTHESIA N/A 07/17/2019    Procedure: IR WITH ANESTHESIA;  Surgeon: Dolphus Carrion, MD;  Location: [facility] OR;  Service: Radiology;  Laterality: N/A;   REPLACEMENT TOTAL KNEE BILATERAL Bilateral 2018    Right x 2. 2019 undergoing intermittent injection RFA procedures with Dr. Joane Cave [facility]   REVISION TOTAL KNEE ARTHROPLASTY Right 03/2017   ROBOTIC ASSISTED TOTAL HYSTERECTOMY WITH BILATERAL SALPINGO OOPHERECTOMY N/A 05/03/2018    Procedure: XI ROBOTIC ASSISTED TOTAL HYSTERECTOMY WITH BILATERAL SALPINGO OOPHORECTOMY;  Surgeon: Eloy Herring, MD;  Location: [facility];  Service: Gynecology;  Laterality: N/A;   SENTINEL NODE BIOPSY N/A 05/03/2018     Procedure: SENTINEL NODE BIOPSY;  Surgeon: Eloy Herring, MD;  Location: [facility];  Service: Gynecology;  Laterality: N/A;   TONSILLECTOMY AND ADENOIDECTOMY       TUBAL LIGATION              Social History Jordan Martinez  reports that she has been smoking cigarettes. She has a 10 pack-year smoking history. She has never used smokeless tobacco. She reports current alcohol use of about 14.0 standard drinks of alcohol per week. She reports that she does not use drugs.   family history includes Colon cancer (age of onset: 75) in her daughter; Colon polyps (age of onset: 47) in her daughter; Healthy in her son. She was adopted.   Allergies       Allergies  Allergen Reactions   Amlodipine        Blurred vision    Compazine [Prochlorperazine Edisylate]        Stroke symptoms,   Gabapentin        Blurred vision and nystagmus   Lyrica [Pregabalin]        Blurred Vision    Prochlorperazine Other (See Comments)      Could not tolerate   Sertraline Other (See Comments)      Visual disturbances.    Chlorhexidine  Rash      Blisters and redness to chest            PHYSICAL EXAMINATION: Vital signs: BP 124/62   Pulse 73   Ht 5' 4 (1.626 m)   Wt 162 lb (73.5 kg)   BMI 27.81 kg/m   Constitutional: generally well-appearing, no acute distress Psychiatric: alert and oriented x3, cooperative Eyes: extraocular movements intact, anicteric, conjunctiva pink Mouth: oral pharynx moist, no lesions Neck: supple no lymphadenopathy Cardiovascular: heart regular rate and rhythm, no murmur Lungs: clear to auscultation bilaterally Abdomen: soft, nontender, nondistended, no obvious ascites, no peritoneal signs, normal bowel sounds, no organomegaly Rectal: Deferred until colonoscopy Extremities: no clubbing, cyanosis, or lower extremity edema bilaterally Skin: no lesions on visible extremities Neuro: No focal deficits.  Cranial nerves intact   ASSESSMENT:   1.  Lynch syndrome 2.  History of  adenomatous polyps 3.  Family history of colon cancer 4.  GERD on PPI 5.  History of A-fib and CVA x 2.  CHA2DS2-VASc score 5.  On Eliquis  6.  History of endometrial cancer 7.  General Medical problems.  Stable     PLAN:   1.  Schedule surveillance colonoscopy.  The patient is high risk given her comorbidities and the need to address anticoagulation. 2.  Schedule upper endoscopy for surveillance. 3.  Continue PPI 4.  As with  previous exam, hold Eliquis  2 days prior to the procedure.  Anticipate immediate resumption postprocedure.  The risks and benefits of temporary holding of anticoagulation reviewed. 5.  Prescribe Zofran  4 mg; #4.  May take prior to and during prep sessions if needed for nausea 6.  Hard candy.  She likes butterscotch 7.  Ongoing general medical care with PCP

## 2024-02-29 NOTE — Op Note (Signed)
 [facility] Patient Name: Jordan Martinez Procedure Date: 02/29/2024 3:02 PM MRN: [id number] Endoscopist: Norleen SAILOR. Abran , MD, [id number] Age: 73 Referring MD:  Date of Birth: 1951-05-20 Gender: Female Account #: [id number] Procedure:                Upper GI endoscopy Indications:              Esophageal reflux, Lynch syndrome Medicines:                Monitored Anesthesia Care Procedure:                Pre-Anesthesia Assessment:                           - Prior to the procedure, a History and Physical                            was performed, and patient medications and                            allergies were reviewed. The patient's tolerance of                            previous anesthesia was also reviewed. The risks                            and benefits of the procedure and the sedation                            options and risks were discussed with the patient.                            All questions were answered, and informed consent                            was obtained. Prior Anticoagulants: The patient has                            taken Eliquis  (apixaban ), last dose was 3 days                            prior to procedure. ASA Grade Assessment: III - A                            patient with severe systemic disease. After                            reviewing the risks and benefits, the patient was                            deemed in satisfactory condition to undergo the                            procedure.  After obtaining informed consent, the endoscope was                            passed under direct vision. Throughout the                            procedure, the patient's blood pressure, pulse, and                            oxygen saturations were monitored continuously. The                            GIF [id number] #[id number] was introduced through the                            mouth, and advanced to the second part of  duodenum.                            The upper GI endoscopy was accomplished without                            difficulty. The patient tolerated the procedure                            well. Scope In: Scope Out: Findings:                 The esophagus was normal.                           The stomach large hiatal hernia. A few Cameron                            erosions. Otherwise normal stomach.                           The examined duodenum was normal.                           The cardia and gastric fundus were normal on                            retroflexion. Complications:            No immediate complications. Estimated Blood Loss:     Estimated blood loss: none. Impression:               1. Large hiatal hernia with Ole erosions.                           2. Otherwise unremarkable EGD including                            visualization of the postbulbar duodenum and the                            major papilla. Recommendation:           -  Patient has a contact number available for                            emergencies. The signs and symptoms of potential                            delayed complications were discussed with the                            patient. Return to normal activities tomorrow.                            Written discharge instructions were provided to the                            patient.                           - Resume previous diet.                           - Continue present medications.                           - Resume Eliquis  today                           - Repeat screening EGD 3 years Jesse Hirst N. Abran, MD 02/29/2024 3:49:10 PM This report has been signed electronically.

## 2024-02-29 NOTE — Op Note (Signed)
 [facility] Patient Name: Jordan Martinez Procedure Date: 02/29/2024 3:02 PM MRN: [id number] Endoscopist: Norleen SAILOR. Jordan Martinez , MD, [id number] Age: 73 Referring MD:  Date of Birth: [birth year] Gender: Female Account #: [id number] Procedure:                Colonoscopy Indications:              High risk colon cancer surveillance: Personal                            history of non-advanced adenomas. Family history of                            colon cancer. Lynch syndrome. Last colonoscopy                            January 2024 (Dr. Eda) Medicines:                Monitored Anesthesia Care Procedure:                Pre-Anesthesia Assessment:                           - Prior to the procedure, a History and Physical                            was performed, and patient medications and                            allergies were reviewed. The patient's tolerance of                            previous anesthesia was also reviewed. The risks                            and benefits of the procedure and the sedation                            options and risks were discussed with the patient.                            All questions were answered, and informed consent                            was obtained. Prior Anticoagulants: The patient has                            taken Eliquis  (apixaban ), last dose was 3 days                            prior to procedure. ASA Grade Assessment: III - A                            patient with severe systemic disease. After  reviewing the risks and benefits, the patient was                            deemed in satisfactory condition to undergo the                            procedure.                           After obtaining informed consent, the colonoscope                            was passed under direct vision. Throughout the                            procedure, the patient's blood pressure, pulse, and                             oxygen saturations were monitored continuously. The                            CF [id number] #[id number] was introduced through the anus                            and advanced to the the cecum, identified by                            appendiceal orifice and ileocecal valve. The                            ileocecal valve, appendiceal orifice, and rectum                            were photographed. The quality of the bowel                            preparation was excellent. The colonoscopy was                            performed without difficulty. The patient tolerated                            the procedure well. The bowel preparation used was                            SUPREP via split dose instruction. Scope In: 3:16:30 PM Scope Out: 3:30:31 PM Scope Withdrawal Time: 0 hours 8 minutes 11 seconds  Total Procedure Duration: 0 hours 14 minutes 1 second  Findings:                 Multiple diverticula were found in the left colon                            with SIGMOID STENOSIS.  The exam was otherwise without abnormality on                            direct and retroflexion views. Complications:            No immediate complications. Estimated blood loss:                            None. Estimated Blood Loss:     Estimated blood loss: none. Impression:               - Diverticulosis in the left colon. RECTOSIGMOID                            STENOSIS                           - The examination was otherwise normal on direct                            and retroflexion views.                           - No specimens collected. Recommendation:           - Repeat colonoscopy in 1 year for surveillance                            (Lynch syndrome). PEDIATRIC SCOPE                           - Resume Eliquis  (apixaban ) today at prior dose.                           - Patient has a contact number available for                            emergencies. The signs and  symptoms of potential                            delayed complications were discussed with the                            patient. Return to normal activities tomorrow.                            Written discharge instructions were provided to the                            patient.                           - Resume previous diet.                           - Continue present medications. Norleen SAILOR. Abran, MD 02/29/2024 3:36:08 PM This report has been signed electronically.

## 2024-02-29 NOTE — Progress Notes (Signed)
 Report to PACU, RN, vss, BBS= Clear.

## 2024-02-29 NOTE — Patient Instructions (Addendum)
 Repeat screening EGD 3 years.  Repeat colonoscopy in 1 year for surveillance.  Resume Eliquis  (apixaban ) today at prior dose. Resume previous diet. Continue present medications.  Handouts provided on hiatal hernia and diverticulosis.   YOU HAD AN ENDOSCOPIC PROCEDURE TODAY AT THE [facility]:   Refer to the procedure report that was given to you for any specific questions about what was found during the examination.  If the procedure report does not answer your questions, please call your gastroenterologist to clarify.  If you requested that your care partner not be given the details of your procedure findings, then the procedure report has been included in a sealed envelope for you to review at your convenience later.  YOU SHOULD EXPECT: Some feelings of bloating in the abdomen. Passage of more gas than usual.  Walking can help get rid of the air that was put into your GI tract during the procedure and reduce the bloating. If you had a lower endoscopy (such as a colonoscopy or flexible sigmoidoscopy) you may notice spotting of blood in your stool or on the toilet paper. If you underwent a bowel prep for your procedure, you may not have a normal bowel movement for a few days.  Please Note:  You might notice some irritation and congestion in your nose or some drainage.  This is from the oxygen used during your procedure.  There is no need for concern and it should clear up in a day or so.  SYMPTOMS TO REPORT IMMEDIATELY:  Following lower endoscopy (colonoscopy or flexible sigmoidoscopy):  Excessive amounts of blood in the stool  Significant tenderness or worsening of abdominal pains  Swelling of the abdomen that is new, acute  Fever of 100F or higher  Following upper endoscopy (EGD)  Vomiting of blood or coffee ground material  New chest pain or pain under the shoulder blades  Painful or persistently difficult swallowing  New shortness of breath  Fever of 100F or  higher  Black, tarry-looking stools  For urgent or emergent issues, a gastroenterologist can be reached at any hour by calling [phone]. Do not use MyChart messaging for urgent concerns.    DIET:  We do recommend a small meal at first, but then you may proceed to your regular diet.  Drink plenty of fluids but you should avoid alcoholic beverages for 24 hours.  ACTIVITY:  You should plan to take it easy for the rest of today and you should NOT DRIVE or use heavy machinery until tomorrow (because of the sedation medicines used during the test).    FOLLOW UP: Our staff will call the number listed on your records the next business day following your procedure.  We will call around 7:15- 8:00 am to check on you and address any questions or concerns that you may have regarding the information given to you following your procedure. If we do not reach you, we will leave a message.     If any biopsies were taken you will be contacted by phone or by letter within the next 1-3 weeks.  Please call us  at [phone] if you have not heard about the biopsies in 3 weeks.    SIGNATURES/CONFIDENTIALITY: You and/or your care partner have signed paperwork which will be entered into your electronic medical record.  These signatures attest to the fact that that the information above on your After Visit Summary has been reviewed and is understood.  Full responsibility of the confidentiality of this discharge information  lies with you and/or your care-partner.

## 2024-03-01 ENCOUNTER — Telehealth: Payer: Self-pay

## 2024-03-01 NOTE — Telephone Encounter (Signed)
  Follow up Call-     02/29/2024    2:11 PM 08/04/2022   10:25 AM  Call back number  Post procedure Call Back phone  # [phone]  Permission to leave phone message Yes Yes     Patient questions:  Do you have a fever, pain , or abdominal swelling? No. Pain Score  0 *  Have you tolerated food without any problems? Yes.    Have you been able to return to your normal activities? Yes.    Do you have any questions about your discharge instructions: Diet   No. Medications  No. Follow up visit  No.  Do you have questions or concerns about your Care? No.  Actions: * If pain score is 4 or above: No action needed, pain <4.

## 2024-03-06 ENCOUNTER — Ambulatory Visit (INDEPENDENT_AMBULATORY_CARE_PROVIDER_SITE_OTHER)

## 2024-03-06 DIAGNOSIS — I63412 Cerebral infarction due to embolism of left middle cerebral artery: Secondary | ICD-10-CM | POA: Diagnosis not present

## 2024-03-08 LAB — CUP PACEART REMOTE DEVICE CHECK
Date Time Interrogation Session: 20250830230243
Implantable Pulse Generator Implant Date: 20210113

## 2024-03-12 ENCOUNTER — Ambulatory Visit: Payer: Self-pay | Admitting: Cardiology

## 2024-03-14 NOTE — Progress Notes (Signed)
 Remote Loop Recorder Transmission

## 2024-04-05 NOTE — Progress Notes (Signed)
 Remote Loop Recorder Transmission

## 2024-04-06 ENCOUNTER — Ambulatory Visit

## 2024-04-06 DIAGNOSIS — I63412 Cerebral infarction due to embolism of left middle cerebral artery: Secondary | ICD-10-CM | POA: Diagnosis not present

## 2024-04-07 LAB — CUP PACEART REMOTE DEVICE CHECK
Date Time Interrogation Session: 20251001230144
Implantable Pulse Generator Implant Date: 20210113

## 2024-04-07 NOTE — Progress Notes (Signed)
 Remote Loop Recorder Transmission

## 2024-04-10 ENCOUNTER — Ambulatory Visit: Payer: Self-pay | Admitting: Cardiology

## 2024-05-08 ENCOUNTER — Ambulatory Visit

## 2024-05-08 DIAGNOSIS — I63412 Cerebral infarction due to embolism of left middle cerebral artery: Secondary | ICD-10-CM | POA: Diagnosis not present

## 2024-05-08 LAB — CUP PACEART REMOTE DEVICE CHECK
Date Time Interrogation Session: 20251102230022
Implantable Pulse Generator Implant Date: 20210113

## 2024-05-09 ENCOUNTER — Ambulatory Visit: Payer: Self-pay | Admitting: Cardiology

## 2024-05-12 NOTE — Progress Notes (Signed)
 Remote Loop Recorder Transmission

## 2024-06-08 ENCOUNTER — Encounter

## 2024-06-09 ENCOUNTER — Ambulatory Visit: Attending: Cardiology

## 2024-06-09 DIAGNOSIS — I480 Paroxysmal atrial fibrillation: Secondary | ICD-10-CM

## 2024-06-09 LAB — CUP PACEART REMOTE DEVICE CHECK
Date Time Interrogation Session: 20251204230431
Implantable Pulse Generator Implant Date: 20210113

## 2024-06-13 NOTE — Progress Notes (Signed)
 Remote Loop Recorder Transmission

## 2024-06-15 ENCOUNTER — Other Ambulatory Visit (HOSPITAL_COMMUNITY): Payer: Self-pay | Admitting: *Deleted

## 2024-07-03 ENCOUNTER — Telehealth: Payer: Self-pay | Admitting: Adult Health

## 2024-07-03 NOTE — Telephone Encounter (Signed)
 I called pharmacy..  she received a 90 day supply 05-17-2024. She should have enough now until February.  Her appt is 08-30-2024.  So may need new prescription.

## 2024-07-03 NOTE — Telephone Encounter (Signed)
 Pt called stating that her pharmacy [facility] informed her that they are needing a new Rx with refills for her rosuvastatin  (CRESTOR ) 40 MG tablet sent in. Please advise.

## 2024-07-05 MED ORDER — ROSUVASTATIN CALCIUM 40 MG PO TABS: 40.0000 mg | ORAL_TABLET | Freq: Every day | ORAL | 3 refills | Status: AC

## 2024-07-05 NOTE — Addendum Note (Signed)
 Addended by: WHITFIELD RAISIN L on: 07/05/2024 09:06 AM   Modules accepted: Orders

## 2024-07-08 ENCOUNTER — Ambulatory Visit: Payer: Self-pay | Admitting: Cardiology

## 2024-07-10 ENCOUNTER — Ambulatory Visit: Attending: Cardiology

## 2024-07-10 DIAGNOSIS — I63412 Cerebral infarction due to embolism of left middle cerebral artery: Secondary | ICD-10-CM | POA: Diagnosis not present

## 2024-07-11 ENCOUNTER — Ambulatory Visit: Payer: Self-pay | Admitting: Cardiology

## 2024-07-11 LAB — CUP PACEART REMOTE DEVICE CHECK
Date Time Interrogation Session: 20260104230129
Implantable Pulse Generator Implant Date: 20210113

## 2024-07-17 NOTE — Progress Notes (Signed)
 Remote Loop Recorder Transmission

## 2024-08-09 ENCOUNTER — Inpatient Hospital Stay: Admitting: Medical Oncology

## 2024-08-09 ENCOUNTER — Inpatient Hospital Stay

## 2024-08-10 ENCOUNTER — Ambulatory Visit

## 2024-08-10 ENCOUNTER — Ambulatory Visit: Payer: Self-pay | Admitting: Medical Oncology

## 2024-08-10 ENCOUNTER — Inpatient Hospital Stay

## 2024-08-10 ENCOUNTER — Inpatient Hospital Stay: Admitting: Medical Oncology

## 2024-08-10 ENCOUNTER — Encounter: Payer: Self-pay | Admitting: Medical Oncology

## 2024-08-10 ENCOUNTER — Other Ambulatory Visit: Payer: Self-pay

## 2024-08-10 VITALS — BP 137/83 | HR 80 | Temp 98.1°F | Resp 19 | Ht 64.0 in | Wt 162.0 lb

## 2024-08-10 DIAGNOSIS — R7989 Other specified abnormal findings of blood chemistry: Secondary | ICD-10-CM

## 2024-08-10 DIAGNOSIS — Z7901 Long term (current) use of anticoagulants: Secondary | ICD-10-CM

## 2024-08-10 DIAGNOSIS — Z789 Other specified health status: Secondary | ICD-10-CM

## 2024-08-10 DIAGNOSIS — D649 Anemia, unspecified: Secondary | ICD-10-CM | POA: Insufficient documentation

## 2024-08-10 DIAGNOSIS — M810 Age-related osteoporosis without current pathological fracture: Secondary | ICD-10-CM | POA: Insufficient documentation

## 2024-08-10 LAB — CBC WITH DIFFERENTIAL (CANCER CENTER ONLY)
Abs Immature Granulocytes: 0.07 10*3/uL (ref 0.00–0.07)
Basophils Absolute: 0.1 10*3/uL (ref 0.0–0.1)
Basophils Relative: 1 %
Eosinophils Absolute: 0.3 10*3/uL (ref 0.0–0.5)
Eosinophils Relative: 2 %
HCT: 31.2 % — ABNORMAL LOW (ref 36.0–46.0)
Hemoglobin: 9.2 g/dL — ABNORMAL LOW (ref 12.0–15.0)
Immature Granulocytes: 1 %
Lymphocytes Relative: 33 %
Lymphs Abs: 3.6 10*3/uL (ref 0.7–4.0)
MCH: 20.9 pg — ABNORMAL LOW (ref 26.0–34.0)
MCHC: 29.5 g/dL — ABNORMAL LOW (ref 30.0–36.0)
MCV: 70.9 fL — ABNORMAL LOW (ref 80.0–100.0)
Monocytes Absolute: 1.2 10*3/uL — ABNORMAL HIGH (ref 0.1–1.0)
Monocytes Relative: 11 %
Neutro Abs: 5.7 10*3/uL (ref 1.7–7.7)
Neutrophils Relative %: 52 %
Platelet Count: 440 10*3/uL — ABNORMAL HIGH (ref 150–400)
RBC: 4.4 MIL/uL (ref 3.87–5.11)
RDW: 19.9 % — ABNORMAL HIGH (ref 11.5–15.5)
WBC Count: 11 10*3/uL — ABNORMAL HIGH (ref 4.0–10.5)
nRBC: 0 % (ref 0.0–0.2)

## 2024-08-10 LAB — CUP PACEART REMOTE DEVICE CHECK
Date Time Interrogation Session: 20260204230137
Implantable Pulse Generator Implant Date: 20210113

## 2024-08-10 LAB — RETICULOCYTES
Immature Retic Fract: 31.2 % — ABNORMAL HIGH (ref 2.3–15.9)
RBC.: 4.37 MIL/uL (ref 3.87–5.11)
Retic Count, Absolute: 69.5 10*3/uL (ref 19.0–186.0)
Retic Ct Pct: 1.6 % (ref 0.4–3.1)

## 2024-08-10 LAB — SAMPLE TO BLOOD BANK

## 2024-08-10 NOTE — Progress Notes (Signed)
 " Hematology and Oncology New Patient Office Visit Note  Jordan Martinez [id number] [birth year] 74 y.o. 08/10/2024  Past Medical History:  Diagnosis Date   Allergy    Anemia    Anxiety    on meds   Arthritis    on PRN meds   Cataract    bilateral cat. ext. with lens implant- in [place]   Diabetes mellitus without complication (HCC)    diet controlled. States always has borderline HgbA1C   Endometrial cancer (HCC)    endometrial CA-2019   Family history of colon cancer    daughter 15 colon CA   Family history of genetic mutation for hereditary nonpolyposis colorectal cancer (HNPCC)    GERD (gastroesophageal reflux disease)    on meds   High cholesterol    on meds   History of kidney stones    years    Hypertension    on meds   Neuromuscular disorder (HCC)    Neuropathy    Osteoporosis    Sensation of pressure in bladder area    Stroke (HCC) 07/17/2019   07/2021 (started on Eliquis  in 03/2022)   TIA (transient ischemic attack) 07/2021   Uterine cramping     Principle Diagnosis:  Anemia Has history of Lynch Syndrome  [facility] Current Therapy:   IV Iron- Planning to start 08/2024  PriorTherapy:   Oral iron- d/c constipation    Interim History:  Jordan Martinez is back for a new patient visit for: She is medically complex.     She is originally from [place] . Now lives at [facility].   She has a history of endometrial cancer in 2019. She had surgical removal with Jordan Martinez with [facility].  She has a history of having a port-a-cath but this has been removed. She was seen and treated by Jordan Martinez. The patient had carboplatin  and Taxol  x 2 cycles, Cycle 3 Taxol  at 50%, then cycle 4-6 without Taxol , carboplatin  only. She has now graduated from Jordan Martinez to Jordan Martinez - she sees her yearly. Her next follow up is in April, 2026.   She has a history of chronic kidney disease.   She has a history of two strokes. Her first was in  July 17, 2019, her second was a TIA July 29, 2021. She was started on Eliquis  after her second stroke. There has been no bleeding to her knowledge: denies epistaxis, gingivitis, hemoptysis, hematemesis, hematuria, melena, excessive bruising, blood donation.   Blood in stools:No Change of bowel movement/constipation: No Family history of Martinez cancers: Yes- See above Last Colonoscopy: Jordan Martinez- February 29, 2024- Jordan Martinez 3 years Last Endoscopy: August 26,2025 History of GERD or Martinez bleed: No UC/Crohn's: No PPI use: Yes- 40 mg omeprazole  Hiatal Hernia: No NSAID use: No Blood in urine: No Difficulty swallowing: No Pica: No SOB: No Fatigue: Yes Dizziness: occasional  Prior blood transfusion: No Prior history of blood loss: No Liver disease: No Kidney Disease: Yes Bariatric/Intestinal surgery: No Frequent Blood Donation: No Vaginal bleeding: No  Prior evaluation with hematology: No Prior bone marrow biopsy: No Oral iron: Not currently Prior IV iron infusions: No Family history Anemia: No known sickle cell or thalassemia - she was adopted  No Personal or family history of bleeding or clotting disorders.    Wt Readings from Last 3 Encounters:  08/10/24 162 lb (73.5 kg)  02/29/24 162 lb (73.5 kg)  01/27/24 162 lb (73.5 kg)  Medications:  Current Medications[1]  Allergies: Allergies[2]  Past Medical History, Surgical history, Social history, and Family History were reviewed and updated.  Review of Systems: Review of Systems  Constitutional:  Positive for fatigue. Negative for appetite change, chills, diaphoresis, fever and unexpected weight change.  HENT:  Negative.    Eyes: Negative.   Respiratory: Negative.    Cardiovascular: Negative.   Gastrointestinal: Negative.   Endocrine: Negative.   Genitourinary: Negative.    Musculoskeletal: Negative.   Skin: Negative.   Neurological: Negative.   Hematological: Negative.   Psychiatric/Behavioral:  The patient is  nervous/anxious.     Physical Exam:  height is 5' 4 (1.626 m) and weight is 162 lb (73.5 kg). Her oral temperature is 98.1 F (36.7 C). Her blood pressure is 137/83 and her pulse is 80. Her respiration is 19 and oxygen saturation is 98%.   Physical Exam General: NAD Cardiovascular: regular rate and rhythm Pulmonary: clear ant fields Abdomen: soft, nontender, + bowel sounds GU: no suprapubic tenderness Extremities: no edema, no joint deformities Skin: no rashes Psych: Pressured speech  Neurological: Weakness but otherwise nonfocal   Lab Results  Component Value Date   WBC 7.9 04/16/2022   HGB 10.3 (L) 04/16/2022   HCT 34.6 (L) 04/16/2022   MCV 79.7 (L) 04/16/2022   PLT 271 04/16/2022     Chemistry      Component Value Date/Time   NA 139 04/16/2022 1441   K 4.6 04/16/2022 1441   CL 106 04/16/2022 1441   CO2 23 04/16/2022 1441   BUN 16 04/16/2022 1441   CREATININE 0.65 04/16/2022 1441   CREATININE 0.82 10/26/2018 0900      Component Value Date/Time   CALCIUM  9.2 04/16/2022 1441   ALKPHOS 80 07/17/2019 2152   AST 33 07/17/2019 2152   AST 22 10/26/2018 0900   ALT 37 07/17/2019 2152   ALT 32 10/26/2018 0900   BILITOT 1.2 07/17/2019 2152   BILITOT 0.3 10/26/2018 0900     Encounter Diagnoses  Name Primary?   Anemia, unspecified type Yes   Elevated serum creatinine    Long term current use of anticoagulant therapy    Assessment and Plan- Patient is a 74 y.o. female who was referred to us  for acute on chronic anemia. She has known iron deficiency from unknown source. She does use a PPI and has known kidney disease. Of note she gets Reclast yearly for osteoporosis.   She has recent labs from 07/11/2024 showing Hgb 8.9, MCV 74.4, platelets 524. She also had a ferritin of 3, iron saturation of 3, TIBC 433, total iron of 14, B12 of >2000, creatinine 1.03, GFR 57. Absolute lymph count was a bit elevated as well at 4036.   Since these labs her B12 dose was reduced in half.    Today we will first focus on fixing her severe iron deficiency and anemia. I have placed ordered for IV Monoferric. She will get this infused this or next week ideally. If further work up is needed thereafter we will certainly complete this.   She will need to continue her close follow up with Martinez. Unsure of where she is bleeding given her recent Endoscopy/Colonoscopy however she may benefit from a capsule study. She denies any other sources of bleeding. At follow up we will check a urinalysis for completeness.   If WBC is off today I will add appropriate additional testing at follow up.   Disposition: RTC this or next week for IV iron RTC  2 months APP, labs (Urinalysis, cbc, iron, ferritin, retic)   I spent 70 minutes dedicated to the care of this patient (face to face and non-face to face) on the date of this encounter as stated above.   Tremond Shimabukuro PA-C 2/5/20262:18 PM       [1]  Current Outpatient Medications:    acetaminophen  (TYLENOL ) 500 MG tablet, Take 1,000 mg by mouth once as needed for mild pain or moderate pain. Extra strength, Disp: , Rfl:    amitriptyline  (ELAVIL ) 10 MG tablet, Take 1 tablet (10 mg total) by mouth at bedtime., Disp: 90 tablet, Rfl: 3   amitriptyline  (ELAVIL ) 25 MG tablet, Take 1 tablet (25 mg total) by mouth at bedtime., Disp: 90 tablet, Rfl: 3   amoxicillin (AMOXIL) 500 MG capsule, Take 2,000 mg by mouth once as needed (dental work)., Disp: , Rfl:    apixaban  (ELIQUIS ) 5 MG TABS tablet, Take 1 tablet (5 mg total) by mouth 2 (two) times daily., Disp: 60 tablet, Rfl: 9   atenolol  (TENORMIN ) 50 MG tablet, Take 75 mg by mouth daily., Disp: , Rfl:    Biotin 5000 MCG TABS, Take 5,000 mcg by mouth daily., Disp: , Rfl:    clotrimazole (LOTRIMIN) 1 % cream, Apply 1 Application topically as needed., Disp: , Rfl:    Coenzyme Q10 (CO Q 10 PO), Take 200 mg by mouth daily., Disp: , Rfl:    hydrocortisone 2.5 % cream, Apply 1 Application topically as needed.,  Disp: , Rfl:    Lancets (ONETOUCH DELICA PLUS LANCET33G) MISC, Apply topically daily., Disp: , Rfl:    lisinopril  (ZESTRIL ) 10 MG tablet, Take 10 mg by mouth daily., Disp: , Rfl:    metFORMIN  (GLUCOPHAGE ) 1000 MG tablet, Take by mouth., Disp: , Rfl:    niacin (VITAMIN B3) 250 MG tablet, Take 250 mg by mouth at bedtime., Disp: , Rfl:    Omega-3 Fatty Acids (FISH OIL PO), Take 1,400 mg by mouth daily., Disp: , Rfl:    omeprazole  (PRILOSEC) 40 MG capsule, Take 1 capsule (40 mg total) by mouth daily., Disp: 90 capsule, Rfl: 3   ONETOUCH ULTRA test strip, USE TO CHECK BLOOD SUGAR ONCE DAILY E11.69, Disp: , Rfl:    Propylene Glycol 0.6 % SOLN, Place 2 drops into both eyes 4 (four) times daily., Disp: , Rfl:    rosuvastatin  (CRESTOR ) 40 MG tablet, Take 1 tablet (40 mg total) by mouth daily., Disp: 90 tablet, Rfl: 3   senna-docusate (SENOKOT-S) 8.6-50 MG tablet, Take 1 tablet by mouth as needed for mild constipation., Disp: , Rfl:  [2]  Allergies Allergen Reactions   Compazine [Prochlorperazine Edisylate] Other (See Comments)    Stroke symptoms,   Amlodipine Other (See Comments)    Blurred vision    Gabapentin Other (See Comments)    Blurred vision and nystagmus   Lyrica [Pregabalin] Other (See Comments)    Blurred Vision    Sertraline Other (See Comments)    Visual disturbances.    Chlorhexidine  Rash    Blisters and redness to chest   Prochlorperazine Other (See Comments)    Could not tolerate   "

## 2024-08-10 NOTE — Addendum Note (Signed)
 Addended by: Eleny Cortez on: 08/10/2024 04:12 PM   Modules accepted: Orders

## 2024-08-11 LAB — ERYTHROPOIETIN: Erythropoietin: 139.5 m[IU]/mL — ABNORMAL HIGH (ref 2.6–18.5)

## 2024-08-17 ENCOUNTER — Inpatient Hospital Stay

## 2024-08-30 ENCOUNTER — Ambulatory Visit: Payer: Medicare Other | Admitting: Adult Health

## 2024-09-10 ENCOUNTER — Ambulatory Visit

## 2024-09-19 ENCOUNTER — Ambulatory Visit (HOSPITAL_COMMUNITY): Admitting: Internal Medicine

## 2024-10-12 ENCOUNTER — Inpatient Hospital Stay: Admitting: Medical Oncology

## 2024-10-12 ENCOUNTER — Inpatient Hospital Stay

## 2024-10-17 ENCOUNTER — Inpatient Hospital Stay: Admitting: Gynecologic Oncology
